# Patient Record
Sex: Male | Born: 1960
Health system: Southern US, Community
[De-identification: ages and names within clinical notes are randomized; demographics above are authoritative.]

## PROBLEM LIST (undated history)

## (undated) DIAGNOSIS — E039 Hypothyroidism, unspecified: Secondary | ICD-10-CM

## (undated) DIAGNOSIS — R42 Dizziness and giddiness: Secondary | ICD-10-CM

## (undated) DIAGNOSIS — E7800 Pure hypercholesterolemia, unspecified: Secondary | ICD-10-CM

## (undated) DIAGNOSIS — K922 Gastrointestinal hemorrhage, unspecified: Secondary | ICD-10-CM

## (undated) DIAGNOSIS — I700 Atherosclerosis of aorta: Secondary | ICD-10-CM

## (undated) DIAGNOSIS — E890 Postprocedural hypothyroidism: Secondary | ICD-10-CM

## (undated) DIAGNOSIS — C801 Malignant (primary) neoplasm, unspecified: Secondary | ICD-10-CM

## (undated) DIAGNOSIS — M5030 Other cervical disc degeneration, unspecified cervical region: Secondary | ICD-10-CM

## (undated) DIAGNOSIS — E559 Vitamin D deficiency, unspecified: Secondary | ICD-10-CM

## (undated) DIAGNOSIS — I5189 Other ill-defined heart diseases: Secondary | ICD-10-CM

## (undated) DIAGNOSIS — Z8619 Personal history of other infectious and parasitic diseases: Secondary | ICD-10-CM

## (undated) DIAGNOSIS — K921 Melena: Secondary | ICD-10-CM

## (undated) DIAGNOSIS — M5416 Radiculopathy, lumbar region: Secondary | ICD-10-CM

## (undated) DIAGNOSIS — D509 Iron deficiency anemia, unspecified: Secondary | ICD-10-CM

## (undated) DIAGNOSIS — G4730 Sleep apnea, unspecified: Secondary | ICD-10-CM

## (undated) DIAGNOSIS — E782 Mixed hyperlipidemia: Secondary | ICD-10-CM

## (undated) DIAGNOSIS — G4733 Obstructive sleep apnea (adult) (pediatric): Secondary | ICD-10-CM

## (undated) DIAGNOSIS — I454 Nonspecific intraventricular block: Secondary | ICD-10-CM

## (undated) DIAGNOSIS — E291 Testicular hypofunction: Secondary | ICD-10-CM

## (undated) DIAGNOSIS — C73 Malignant neoplasm of thyroid gland: Secondary | ICD-10-CM

## (undated) HISTORY — PX: NASAL SEPTUM SURGERY: SHX37

## (undated) HISTORY — DX: Malignant (primary) neoplasm, unspecified: C80.1

## (undated) HISTORY — DX: Personal history of other infectious and parasitic diseases: Z86.19

## (undated) HISTORY — DX: Dizziness and giddiness: R42

## (undated) HISTORY — PX: RETINAL DETACHMENT SURGERY: SHX105

## (undated) HISTORY — DX: Pure hypercholesterolemia, unspecified: E78.00

## (undated) HISTORY — PX: CATARACT EXTRACTION W/ INTRAOCULAR LENS  IMPLANT, BILATERAL: SHX1307

## (undated) HISTORY — DX: Sleep apnea, unspecified: G47.30

## (undated) HISTORY — PX: COLONOSCOPY: SHX5424

## (undated) HISTORY — DX: Melena: K92.1

---

## 1998-04-09 HISTORY — PX: CHOLECYSTECTOMY: SHX55

## 2006-03-21 ENCOUNTER — Ambulatory Visit: Payer: Self-pay | Admitting: Internal Medicine

## 2006-03-21 ENCOUNTER — Encounter: Payer: Self-pay | Admitting: Pulmonary Disease

## 2006-04-11 ENCOUNTER — Ambulatory Visit: Payer: Self-pay | Admitting: Internal Medicine

## 2006-04-11 ENCOUNTER — Encounter: Payer: Self-pay | Admitting: Pulmonary Disease

## 2007-04-10 DIAGNOSIS — C73 Malignant neoplasm of thyroid gland: Secondary | ICD-10-CM

## 2007-04-10 HISTORY — PX: TOTAL THYROIDECTOMY: SHX2547

## 2007-04-10 HISTORY — DX: Malignant neoplasm of thyroid gland: C73

## 2007-05-09 ENCOUNTER — Ambulatory Visit: Payer: Self-pay | Admitting: Internal Medicine

## 2007-05-15 ENCOUNTER — Ambulatory Visit: Payer: Self-pay | Admitting: Internal Medicine

## 2007-06-13 ENCOUNTER — Ambulatory Visit (HOSPITAL_COMMUNITY): Admission: RE | Admit: 2007-06-13 | Discharge: 2007-06-14 | Payer: Self-pay | Admitting: Surgery

## 2007-06-13 ENCOUNTER — Encounter (INDEPENDENT_AMBULATORY_CARE_PROVIDER_SITE_OTHER): Payer: Self-pay | Admitting: Surgery

## 2007-06-13 HISTORY — PX: TOTAL THYROIDECTOMY: SHX2547

## 2007-06-13 HISTORY — PX: LYMPH NODE DISSECTION: SHX5087

## 2007-08-12 ENCOUNTER — Encounter (HOSPITAL_COMMUNITY): Admission: RE | Admit: 2007-08-12 | Discharge: 2007-11-10 | Payer: Self-pay | Admitting: Endocrinology

## 2007-08-15 ENCOUNTER — Ambulatory Visit (HOSPITAL_COMMUNITY): Admission: RE | Admit: 2007-08-15 | Discharge: 2007-08-15 | Payer: Self-pay | Admitting: Endocrinology

## 2007-08-22 ENCOUNTER — Encounter (HOSPITAL_COMMUNITY): Admission: RE | Admit: 2007-08-22 | Discharge: 2007-11-20 | Payer: Self-pay | Admitting: Endocrinology

## 2008-03-02 ENCOUNTER — Ambulatory Visit (HOSPITAL_COMMUNITY): Admission: RE | Admit: 2008-03-02 | Discharge: 2008-03-03 | Payer: Self-pay | Admitting: Ophthalmology

## 2008-04-09 DIAGNOSIS — H3322 Serous retinal detachment, left eye: Secondary | ICD-10-CM

## 2008-04-09 HISTORY — DX: Serous retinal detachment, left eye: H33.22

## 2008-05-14 ENCOUNTER — Ambulatory Visit: Payer: Self-pay | Admitting: Unknown Physician Specialty

## 2008-05-19 LAB — HM COLONOSCOPY

## 2008-07-19 ENCOUNTER — Ambulatory Visit: Payer: Self-pay | Admitting: Endocrinology

## 2008-08-06 ENCOUNTER — Ambulatory Visit: Payer: Self-pay | Admitting: Internal Medicine

## 2008-08-10 ENCOUNTER — Ambulatory Visit: Payer: Self-pay | Admitting: Internal Medicine

## 2008-09-07 DIAGNOSIS — D229 Melanocytic nevi, unspecified: Secondary | ICD-10-CM

## 2008-09-07 HISTORY — DX: Melanocytic nevi, unspecified: D22.9

## 2009-05-20 ENCOUNTER — Encounter: Payer: Self-pay | Admitting: Pulmonary Disease

## 2009-06-10 ENCOUNTER — Ambulatory Visit: Payer: Self-pay | Admitting: Pulmonary Disease

## 2009-06-10 DIAGNOSIS — G4733 Obstructive sleep apnea (adult) (pediatric): Secondary | ICD-10-CM | POA: Insufficient documentation

## 2009-06-10 DIAGNOSIS — Z85850 Personal history of malignant neoplasm of thyroid: Secondary | ICD-10-CM | POA: Insufficient documentation

## 2009-06-21 ENCOUNTER — Encounter (INDEPENDENT_AMBULATORY_CARE_PROVIDER_SITE_OTHER): Payer: Self-pay | Admitting: *Deleted

## 2009-07-04 ENCOUNTER — Ambulatory Visit: Payer: Self-pay | Admitting: Endocrinology

## 2009-11-11 ENCOUNTER — Encounter: Payer: Self-pay | Admitting: Pulmonary Disease

## 2009-12-20 DIAGNOSIS — D239 Other benign neoplasm of skin, unspecified: Secondary | ICD-10-CM

## 2009-12-20 HISTORY — DX: Other benign neoplasm of skin, unspecified: D23.9

## 2010-05-09 NOTE — Letter (Signed)
Summary: New Patient letter  [facility]  [address] [place], [place] [address]   Phone: [phone]  Fax: [phone]       06/21/2009 MRN: [id number]  Matthew Kelley [address] [place], [place]  [address]  Dear Matthew Kelley,  Welcome to the Gastroenterology Division at [facility].    You are scheduled to see Dr.  Arlyce Dice on 07-20-09 at 10am on the 3rd floor at [facility], [address].  We ask that you try to arrive at our office 15 minutes prior to your appointment time to allow for check-in.  We would like you to complete the enclosed self-administered evaluation form prior to your visit and bring it with you on the day of your appointment.  We will review it with you.  Also, please bring a complete list of all your medications or, if you prefer, bring the medication bottles and we will list them.  Please bring your insurance card so that we may make a copy of it.  If your insurance requires a referral to see a specialist, please bring your referral form from your primary care physician.  Co-payments are due at the time of your visit and may be paid by cash, check or credit card.     Your office visit will consist of a consult with your physician (includes a physical exam), any laboratory testing he/she may order, scheduling of any necessary diagnostic testing (e.g. x-ray, ultrasound, CT-scan), and scheduling of a procedure (e.g. Endoscopy, Colonoscopy) if required.  Please allow enough time on your schedule to allow for any/all of these possibilities.    If you cannot keep your appointment, please call [phone] to cancel or reschedule prior to your appointment date.  This allows [place] the opportunity to schedule an appointment for another patient in need of care.  If you do not cancel or reschedule by 5 p.m. the business day prior to your appointment date, you will be charged a $50.00 late cancellation/no-show fee.    Thank you for choosing [facility] for your medical needs.  We appreciate the opportunity to care for you.  Please visit [place] at our website  to learn more about our practice.                     Sincerely,                                                             The Gastroenterology Division

## 2010-05-09 NOTE — Letter (Signed)
Summary: [facility]  [facility]   Imported By: Sherian Rein 07/05/2009 10:12:44  _____________________________________________________________________  External Attachment:    Type:   Image     Comment:   External Document

## 2010-05-09 NOTE — Assessment & Plan Note (Signed)
Summary: consult for osa   Copy to:  [place] Primary Provider/Referring Provider:  Georgiana Shore  CC:  Sleep Consult.  History of Present Illness: The pt is a 50y/o male who I have been asked to see for osa.  He has a h/o mild osa from npsg in 2007, which showed AHI of 8/hr, and desat to 81%.  He underwent titration study, and found to have optimal pressure of 11cm.  He was started on cpap, and did well with the device from a symptom standpoint, but is bothered by the inconvenience of the device and feels is adversely affecting his QOL.  Without cpap, he has definite snoring arousals, disrupts his wife's sleep, and has very mild daytime symptoms that are more aggrevating than anything else.  He typically goes to bed btw 9-11pm, and arises at 7am to start his day.  He does not feel unrested.  He does not feel significant EDS, and rates his epworth btw 9-13 on various days.  His weight overall has been stable.  He has been evaluated by Dr. Annalee Genta for possible surgical intervention, who feels that his overall anatomy is not that abnormal except for elongation of his palate.  Preventive Screening-Counseling & Management  Alcohol-Tobacco     Smoking Status: never  Medications Prior to Update: 1)  Synthroid 137 Mcg Tabs (Levothyroxine Sodium) .... Take 1 Tablet By Mouth Once A Day  Allergies (verified): No Known Drug Allergies  Past History:  Past Medical History:  THYROID CANCER, HX OF (ICD-V10.87) OBSTRUCTIVE SLEEP APNEA (ICD-327.23)    Past Surgical History: gallbladder  removed 10+ years ago thyroid removed 2009  Family History: Reviewed history and no changes required. none per pt report.   Social History: Reviewed history and no changes required. Patient never smoked.  pt is married. pt has children. pt is a Armed forces operational officer.   Smoking Status:  never  Review of Systems       The patient complains of difficulty swallowing and sore throat.  The patient denies  shortness of breath with activity, shortness of breath at rest, productive cough, non-productive cough, coughing up blood, chest pain, irregular heartbeats, acid heartburn, indigestion, loss of appetite, weight change, abdominal pain, tooth/dental problems, headaches, nasal congestion/difficulty breathing through nose, sneezing, itching, ear ache, anxiety, depression, hand/feet swelling, joint stiffness or pain, rash, change in color of mucus, and fever.    Vital Signs:  Patient profile:   50 year old male Height:      73 inches Weight:      198 pounds BMI:     26.22 O2 Sat:      93 % on Room air Temp:     98.1 degrees F oral Pulse rate:   78 / minute BP sitting:   130 / 70  (left arm) Cuff size:   regular  Vitals Entered By: Arman Filter LPN (June 10, 1189 10:27 AM)  O2 Flow:  Room air CC: Sleep Consult Comments Medications reviewed with patient Arman Filter LPN  June 10, 4780 10:33 AM    Physical Exam  General:  wd male in nad Eyes:  PERRLA and EOMI.   Nose:  minimal deviation to left, but patent bilat. Mouth:  moderate elongation of soft palate, normal uvula Neck:  no jvd, tmg, LN Lungs:  totally clear to auscultation  Heart:  rrr, no mrg Abdomen:  soft and nontender, bs+ Extremities:  no edema or cyanosis Neurologic:  alert and oriented, moves all 4.   Impression &  Recommendations:  Problem # 1:  OBSTRUCTIVE SLEEP APNEA (ICD-327.23)  The pt has a h/o very mild osa that is adequately treated with cpap, but the treatment is interfering with his QOL.  Surgery may be an option for him, as well as modest weight loss, but I have also discussed with him the possibility of a dental appliance.  I have given him an educational brochure on appliances, and asked him to do some research to see what he thinks.  I would be happy to refer him to [facility] for evaluation.  Other Orders: Consultation Level IV (81191)  Patient Instructions: 1)  consider dental appliance.. Will be  happy to refer you to [facility] dentistry for this. 2)  work on modest weight loss.

## 2010-05-09 NOTE — Consult Note (Signed)
Summary: [facility]   Imported By: Sherian Rein 01/16/2010 15:11:38  _____________________________________________________________________  External Attachment:    Type:   Image     Comment:   External Document

## 2010-06-29 ENCOUNTER — Telehealth: Payer: Self-pay | Admitting: Pulmonary Disease

## 2010-06-29 DIAGNOSIS — G4733 Obstructive sleep apnea (adult) (pediatric): Secondary | ICD-10-CM

## 2010-06-29 NOTE — Telephone Encounter (Signed)
Pt returning call

## 2010-06-29 NOTE — Telephone Encounter (Signed)
ATC # provided above- NA and unable to leave message.  Called pt's home # - Left message with wife for pt to return call.

## 2010-06-29 NOTE — Telephone Encounter (Signed)
ATC pt at # above.  NA and unable to leave message.  Therefore called pt's home # [phone] and [id number].

## 2010-06-29 NOTE — Telephone Encounter (Signed)
Called and spoke with pt.  Pt last saw [facility] 06/2009. Pt was referred to Dr. Orpah Greek at [facility] for a Dental Appliance.  Pt states he has been using his dental appliance for "several months."  Pt states Dr. Orpah Greek recommended pt follow back up with Dr. Shelle Iron to have a f/u sleep study to make sure the dental appliance is treating his osa.  Pt would like to have the sleep study done at [facility].  Will forward message to [facility] to address.

## 2010-06-30 NOTE — Telephone Encounter (Signed)
Called and spoke with pt and he is aware that order has been placed to [facility] to place this order---pt is aware that someone will call and give him the date and time of this test.

## 2010-06-30 NOTE — Telephone Encounter (Signed)
Let pt know we can set this up, and will call him with the results. Please send to [facility] to schedule npsg at sleep center.

## 2010-07-21 ENCOUNTER — Telehealth: Payer: Self-pay | Admitting: Pulmonary Disease

## 2010-07-21 DIAGNOSIS — G4733 Obstructive sleep apnea (adult) (pediatric): Secondary | ICD-10-CM

## 2010-07-21 NOTE — Telephone Encounter (Signed)
Looks the order that was place never made it to [facility]. Placed another order and advised pt someone should be calling him next week to set this up. Pt verbalized understanding.  Please advise [facility] this was suppose to be done 2 weeks ago. Pt also states if any way he can have the sleep study done on a Thursday. Thanks.

## 2010-07-24 NOTE — Telephone Encounter (Signed)
Spoke to pt he is going to get a call from [email or web address] to set up his npsg that is their protocol, records and referral faxed to them pt is aware Oneita Jolly

## 2010-08-22 NOTE — Op Note (Signed)
NAME:  Matthew Kelley, Matthew Kelley NO.:  [id number]   MEDICAL RECORD NO.:  [id number]          PATIENT TYPE:  AMB   LOCATION:  DAY                          FACILITY:  [facility]   PHYSICIAN:  Velora Heckler, MD      DATE OF BIRTH:  1960-08-30   DATE OF PROCEDURE:  06/13/2007  DATE OF DISCHARGE:                               OPERATIVE REPORT   PREOPERATIVE DIAGNOSIS:  Thyroid carcinoma.   POSTOPERATIVE DIAGNOSIS:  Thyroid carcinoma.   PROCEDURES:  1. Total thyroidectomy.  2. Central compartment lymph node dissection (Zone VI).   SURGEON:  Velora Heckler, MD, FACS   ASSISTANT:  Wilmon Arms. Corliss Skains, M.D., FACS   ANESTHESIA:  General per Jill Side, M.D.   ESTIMATED BLOOD LOSS:  Minimal.   PREPARATION:  Betadine.   COMPLICATIONS:  None.   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  The patient had been noted on routine physical examination in January  2009 to have a left thyroid nodule.  Ultrasound at [facility] confirmed a solid nodule in the left thyroid lobe  measuring 2.65 cm in maximum dimension.  It contained calcifications.  Needle biopsy was performed on May 15, 2007, and was suspicious for  papillary thyroid carcinoma.  The patient was referred to my practice  and now comes to surgery for thyroidectomy.   DESCRIPTION OF PROCEDURE:  Procedure was done in OR #1 at [facility].  The patient was prepped and draped in the usual  strict aseptic fashion.  After ascertaining that an adequate level of  anesthesia had been achieved, a Kocher incision is made a #15 blade.  Dissection is carried through subcutaneous tissues and platysma.  Hemostasis is obtained with electrocautery.  Skin flaps are elevated  cephalad and caudad from the thyroid notch to the sternal notch.  A  Mahorner self-retaining retractor is placed for  exposure.  Strap muscles  are incised in the midline and dissection is begun on the left side.  Strap muscles are slightly adherent to the anterior surface of the gland  consistent with previous needle biopsy.  There is a hard mass present in  the mid and inferior portion of the left thyroid lobe consistent with  tumor.  There is no gross invasion of surrounding tissues.  Strap  muscles are reflected laterally.  Venous tributaries are divided between  Ligaclips with the Harmonic scalpel.  Superior pole is dissected out.  Vessels are ligated in continuity with 2-0 silk ties and medium  Ligaclips and divided with the Harmonic scalpel.  Gland is rolled  anteriorly.  Branches of the inferior thyroid artery are divided between  small Ligaclips .  Parathyroid tissue is identified and preserved.  Recurrent nerve is identified and preserved.  Gland is rolled further  anteriorly.  Ligament of Allyson Sabal is transected with the electrocautery.  While tumor is proximate to the ligament of Berry and the trachea, again  mobilized up and onto the anterior trachea and the isthmus is mobilized  across the midline with the electrocautery used for hemostasis.  A small  pyramidal lobe is dissected off the anterior thyroid cartilage and  included with the specimen.  Dry pack is placed in the left neck.   Next, we turned our attention to the right thyroid lobe.  Again strap  muscles are reflected laterally.  The right lobe appears grossly normal.  There are no obvious masses and no palpable nodularity.  Venous  tributaries are divided between Ligaclips with the Harmonic scalpel.  Superior pole is dissected out and superior pole vessels divided between  medium Ligaclips with the Harmonic scalpel.  Gland is rolled anteriorly  and parathyroid tissue is identified and preserved.  Branches of the  inferior thyroid artery are divided between small Ligaclips.  Recurrent  nerve is identified and preserved.  Ligament of  Allyson Sabal is transected with  the electrocautery.  Inferior venous tributaries are divided between  small and medium Ligaclips with the Harmonic scalpel.  Gland is  mobilized off the trachea with electrocautery and the remaining tissue  just inferior to the isthmus is divided with Harmonic scalpel.  Specimen  is marked with a suture at the left superior pole.  The thyroid is  submitted in its entirety to pathology for review.  Next, the central  compartment zone six lymph nodes are dissected out using the  electrocautery for hemostasis.  Dissection is carried between the  carotid arteries down to the level of the innominate.  Care is taken to  avoid the recurrent nerves.  The left inferior parathyroid gland is  identified and preserved.  No gross lymphadenopathy is identified.  Thyrothymic tract on the left is also resected and included with the  specimen.  Hemostasis is obtained with the Harmonic scalpel.  The  specimen is submitted separately labeled central compartment lymph nodes  for review.   Neck is copiously irrigated with warm saline.  This is evacuated.  Good  hemostasis is noted bilaterally.  Surgicel is placed in the operative  field bilaterally.  Strap muscles are reapproximated in the midline with  interrupted 3-0 Vicryl sutures.  Platysma is closed with interrupted 3-0  Vicryl sutures.  Skin is closed with a running 4-0 Monocryl subcuticular  suture.  Wound is washed and dried and Steri-Strips are applied.  Sterile dressings are applied.  The patient is awakened from anesthesia  and brought to the recovery room in stable condition.  The patient  tolerated the procedure well.      Velora Heckler, MD  Electronically Signed     TMG/MEDQ  D:  06/13/2007  T:  06/15/2007  Job:  [id number]   cc:   Dale Bonanza  Fax: [phone]

## 2010-08-22 NOTE — Op Note (Signed)
NAME:  Matthew Kelley, GUASTELLA NO.:  [id number]   MEDICAL RECORD NO.:  [id number]          PATIENT TYPE:  OIB   LOCATION:  5123                         FACILITY:  [facility]   PHYSICIAN:  Beulah Gandy. [name], M.D. DATE OF BIRTH:  [birth year]   DATE OF PROCEDURE:  03/02/2008  DATE OF DISCHARGE:                               OPERATIVE REPORT   ADMISSION DIAGNOSES:  Rhegmatogenous retinal detachment, left eye and  lattice degeneration with breaks, right eye.   PROCEDURES:  Laser retinal breaks, right eye and scleral buckle, left  eye with laser retinal breaks, left eye as well   SURGEON:  John D. [name], MD   ASSISTANT:  Rosalie Doctor, MA   ANESTHESIA:  General.   DESCRIPTION OF PROCEDURE:  After appropriate endotracheal anesthesia was  obtained, the attention was carried to the right eye where 890 burns of  indirect ophthalmoscope laser were used to seal lattice degeneration  zones from 11 o'clock around to 7 o'clock.  The power was 500 mW, 1000  microns each and 0.1 seconds each.  Ointment was placed in the right eye  and it was covered.  Attention was then carried to the left eye where  the usual prep and drape was performed, 360-degree limbal peritomy,  isolation of 4 rectus muscles on 2-0 silk, localization of break at 4  o'clock.  Scleral dissection from 9:30 o'clock around to 8:30 to admit  #279 intrascleral implant.  Diathermy placed in the bed.  Additional  508G radial segment was placed at 5 o'clock.  Perforation site chosen at  4 o'clock in the posterior aspect of the bed.  A large amount of clear,  colorless, sticky, thick subretinal fluid came forth.  The buckle was  adjusted and trimmed.  The band was adjusted and trimmed.  The radial  element was placed.  Scleral sutures were drawn securely.  Indirect  ophthalmoscopy showed the retina be lying nicely on the scleral buckle  with no subretinal fluid remaining.  The indirect ophthalmoscope laser  was moved into  place, 625 burns were placed all around the retinal break  and on the scleral buckle.  The power was 500 mW, 1000 microns each and  0.1 seconds each.  The scleral sutures were drawn securely, knotted, and  the free ends removed.  The conjunctiva was reapproximated with 7-0  chromic suture.  Polymyxin and gentamicin were irrigated into [place]  space.  Atropine solution was applied, Decadron 10 mg was injected into  the lower subconjunctival space.  Closing pressure was 15 with a  Barraquer tonometer.  Polysporin ophthalmic ointment, patch, and shield  were placed.  The patient was awakened and taken to recovery in  satisfactory condition.  Complications none.  Duration one and a half  hours.      Beulah Gandy. [name], M.D.  Electronically Signed     JDM/MEDQ  D:  03/02/2008  T:  03/03/2008  Job:  [id number]

## 2010-08-31 ENCOUNTER — Ambulatory Visit: Payer: Self-pay | Admitting: Pulmonary Disease

## 2010-09-27 ENCOUNTER — Telehealth: Payer: Self-pay | Admitting: Pulmonary Disease

## 2010-09-27 NOTE — Telephone Encounter (Signed)
LMOM informing pt we have not received results yet from [facility] but will call [facility] tomorrow morning to see if they can fax these results to [place].

## 2010-09-27 NOTE — Telephone Encounter (Signed)
KC, have you seen these results - per order, this was going to be done at [facility].  Pls advise.  Thanks!

## 2010-09-27 NOTE — Telephone Encounter (Signed)
I have not seen this study.  Need to track down.  Let pt know that I will call him as soon as I have the results in hand.

## 2010-09-27 NOTE — Telephone Encounter (Signed)
See note string

## 2010-09-28 NOTE — Telephone Encounter (Signed)
Called and requested sleep study results from [facility]

## 2010-09-28 NOTE — Telephone Encounter (Signed)
Received results from [facility] and put in [facility] very important look at folder for him to review.

## 2010-09-29 NOTE — Telephone Encounter (Signed)
Called and discussed results with pt.  He had ahi 10/hr without guard, and 6/hr with guard.  Low sat 80%, with only 2-3 min entire night spent less than 90%.  Pt feels he is doing well with dental appliance.  Sleeping well, adequate daytime alertness. I have asked him to f/u with Dental medicine at [facility], and continue with dental appliance.

## 2010-09-29 NOTE — Telephone Encounter (Signed)
Patient returning call.  [phone]

## 2010-10-09 ENCOUNTER — Encounter: Payer: Self-pay | Admitting: Pulmonary Disease

## 2010-10-12 ENCOUNTER — Ambulatory Visit: Payer: Self-pay | Admitting: Unknown Physician Specialty

## 2010-10-27 ENCOUNTER — Ambulatory Visit: Payer: Self-pay | Admitting: Unknown Physician Specialty

## 2011-01-01 LAB — DIFFERENTIAL
Basophils Absolute: 0
Basophils Relative: 1
Eosinophils Absolute: 0.1
Eosinophils Relative: 3
Lymphocytes Relative: 41
Lymphs Abs: 1.9
Monocytes Absolute: 0.5
Monocytes Relative: 10
Neutro Abs: 2.1
Neutrophils Relative %: 45

## 2011-01-01 LAB — CBC
HCT: 40.3
Hemoglobin: 14
MCHC: 34.7
MCV: 83.4
Platelets: 263
RBC: 4.84
RDW: 13.1
WBC: 4.7

## 2011-01-01 LAB — BASIC METABOLIC PANEL
BUN: 16
CO2: 28
Calcium: 9.3
Chloride: 104
Creatinine, Ser: 1.01
GFR calc Af Amer: >60
GFR calc non Af Amer: >60
Glucose, Bld: 93
Potassium: 4.1
Sodium: 139

## 2011-01-01 LAB — CALCIUM
Calcium: 8.3 — ABNORMAL LOW
Calcium: 8.8

## 2011-01-01 LAB — URINALYSIS, ROUTINE W REFLEX MICROSCOPIC
Bilirubin Urine: NEGATIVE
Glucose, UA: NEGATIVE
Ketones, ur: NEGATIVE
Leukocytes, UA: NEGATIVE
Nitrite: NEGATIVE
Protein, ur: NEGATIVE
Specific Gravity, Urine: 1.028
Urobilinogen, UA: 0.2
pH: 5.5

## 2011-01-01 LAB — URINE MICROSCOPIC-ADD ON

## 2011-01-01 LAB — PROTIME-INR
INR: 1
Prothrombin Time: 13.1

## 2011-01-09 LAB — CBC
HCT: 41.9
Hemoglobin: 14
MCHC: 33.5
MCV: 86.3
Platelets: 192
RBC: 4.85
RDW: 13.1
WBC: 3.7 — ABNORMAL LOW

## 2011-01-09 LAB — BASIC METABOLIC PANEL
BUN: 15
CO2: 28
Calcium: 9.3
Chloride: 107
Creatinine, Ser: 0.96
GFR calc Af Amer: >60
GFR calc non Af Amer: >60
Glucose, Bld: 108 — ABNORMAL HIGH
Potassium: 4
Sodium: 140

## 2012-05-12 ENCOUNTER — Ambulatory Visit: Payer: Self-pay | Admitting: Endocrinology

## 2012-08-14 ENCOUNTER — Ambulatory Visit: Payer: Self-pay | Admitting: Endocrinology

## 2013-07-09 ENCOUNTER — Telehealth: Payer: Self-pay | Admitting: Internal Medicine

## 2013-07-09 NOTE — Telephone Encounter (Signed)
I canceled Dr Nehemiah Massed appointment for Friday 07/10/13 (office closed) He stated he needed Friday appointment please let me know when you would like me to r/s him or do you want me to just go to the next 30 min Friday appointment

## 2013-07-10 ENCOUNTER — Ambulatory Visit: Payer: Self-pay | Admitting: Internal Medicine

## 2013-07-10 NOTE — Telephone Encounter (Signed)
See if he can come in at 11:30 on 08/14/13.  Thanks.  To establish care/cpe

## 2013-07-15 NOTE — Telephone Encounter (Signed)
Appointment 5/8 mailed npp letting pt know about appointment date and time

## 2013-07-24 ENCOUNTER — Encounter: Payer: Self-pay | Admitting: Internal Medicine

## 2013-07-24 ENCOUNTER — Ambulatory Visit (INDEPENDENT_AMBULATORY_CARE_PROVIDER_SITE_OTHER): Payer: No Typology Code available for payment source | Admitting: Internal Medicine

## 2013-07-24 ENCOUNTER — Encounter (INDEPENDENT_AMBULATORY_CARE_PROVIDER_SITE_OTHER): Payer: Self-pay

## 2013-07-24 VITALS — BP 110/70 | HR 73 | Temp 98.5°F | Ht 72.5 in | Wt 198.5 lb

## 2013-07-24 DIAGNOSIS — Z85850 Personal history of malignant neoplasm of thyroid: Secondary | ICD-10-CM

## 2013-07-24 DIAGNOSIS — E291 Testicular hypofunction: Secondary | ICD-10-CM

## 2013-07-24 DIAGNOSIS — G4733 Obstructive sleep apnea (adult) (pediatric): Secondary | ICD-10-CM

## 2013-07-24 DIAGNOSIS — E7800 Pure hypercholesterolemia, unspecified: Secondary | ICD-10-CM

## 2013-07-24 DIAGNOSIS — M25551 Pain in right hip: Secondary | ICD-10-CM

## 2013-07-24 DIAGNOSIS — H3320 Serous retinal detachment, unspecified eye: Secondary | ICD-10-CM

## 2013-07-24 DIAGNOSIS — R7989 Other specified abnormal findings of blood chemistry: Secondary | ICD-10-CM

## 2013-07-24 DIAGNOSIS — M25559 Pain in unspecified hip: Secondary | ICD-10-CM

## 2013-07-24 DIAGNOSIS — D1800 Hemangioma unspecified site: Secondary | ICD-10-CM

## 2013-07-24 NOTE — Progress Notes (Signed)
Pre visit review using our clinic review tool, if applicable. No additional management support is needed unless otherwise documented below in the visit note. 

## 2013-07-27 ENCOUNTER — Encounter: Payer: Self-pay | Admitting: Internal Medicine

## 2013-08-01 ENCOUNTER — Encounter: Payer: Self-pay | Admitting: Internal Medicine

## 2013-08-01 DIAGNOSIS — E7800 Pure hypercholesterolemia, unspecified: Secondary | ICD-10-CM | POA: Insufficient documentation

## 2013-08-01 DIAGNOSIS — M25551 Pain in right hip: Secondary | ICD-10-CM | POA: Insufficient documentation

## 2013-08-01 DIAGNOSIS — H3320 Serous retinal detachment, unspecified eye: Secondary | ICD-10-CM | POA: Insufficient documentation

## 2013-08-01 DIAGNOSIS — R7989 Other specified abnormal findings of blood chemistry: Secondary | ICD-10-CM | POA: Insufficient documentation

## 2013-08-01 DIAGNOSIS — D1800 Hemangioma unspecified site: Secondary | ICD-10-CM | POA: Insufficient documentation

## 2013-08-01 NOTE — Assessment & Plan Note (Signed)
Followed by opthalmology.  Stable.

## 2013-08-01 NOTE — Assessment & Plan Note (Signed)
S/p thyroidectomy and radioactive iodine ablation.  Followed by Dr Forde Dandy.  Obtain records.  Doing well.  Thyroid tests checked this am.

## 2013-08-01 NOTE — Assessment & Plan Note (Signed)
Using CPAP.  Needs new equipment.  Previous sleep study through [facility].

## 2013-08-01 NOTE — Assessment & Plan Note (Signed)
On lipitor.  Had cholesterol panel and liver panel checked this am.  Follow.

## 2013-08-01 NOTE — Assessment & Plan Note (Signed)
Followed by Dr Drema Dallas.

## 2013-08-01 NOTE — Assessment & Plan Note (Signed)
Seeing Dr Stoioff.  On Clomid and arimidex.  Feels better.  Energy improved.    

## 2013-08-01 NOTE — Progress Notes (Signed)
Subjective:    Patient ID: Matthew Kelley, male    DOB: 05-01-1960, 53 y.o.   MRN: [id number]  HPI 53 year old male with past history of thyroid cancer s/p thyroidectomy and radioactive iodine ablation, hypercholesterolemia and sleep apnea who comes in today to follow up on these issues as well as to transfer his care over to [facility].  Former patient of mine at [employer] [facility].  States overall he is doing well.  Has been followed by Dr Roque Cash.  He has been following his thyroid and his cholesterol.  Taking Lipitor and tolerating.  His CPAP is broken.  Needs new equipment.  Had his previous study at [facility].  Eating and drinking well.  No sob.  No chest pain.  Exercising.  [facility] for hemangioma (larynx).  Being followed yearly.  Swallowing ok.  Seeing Dr Shaune Leeks for his right hip pain.  Better.  Also seeing Dr Bernardo Heater and found to have low testosterone.  Started on Clomid and arimidex.  Has more energy and endurance.  Feels better.  Also being followed by opthalmology for a detached retina.  Stable.     Past Medical History  Diagnosis Date  . Cancer     thyroid cancer  . History of chicken pox   . Hypercholesterolemia   . Blood in stool     h/o  . Sleep apnea     Outpatient Encounter Prescriptions as of 07/24/2013  Medication Sig  . anastrozole (ARIMIDEX) 1 MG tablet Take 1 mg by mouth daily.  Marland Kitchen atorvastatin (LIPITOR) 10 MG tablet Take 10 mg by mouth daily.  . clomiPHENE (CLOMID) 50 MG tablet Take 50 mg by mouth every other day.  . Coenzyme Q10 (CO Q 10 PO) Take by mouth daily.  Marland Kitchen levothyroxine (SYNTHROID, LEVOTHROID) 137 MCG tablet Take 137 mcg by mouth daily before breakfast.  . Vitamin D, Ergocalciferol, (DRISDOL) 50000 UNITS CAPS capsule Take 50,000 Units by mouth every 7 (seven) days.    Review of Systems Patient denies any headache, lightheadedness or dizziness.  Eyes stable.  No increased sinus congestion or drainage.  No swallowing problems.   No chest  pain, tightness or palpitations.  No increased shortness of breath, cough or congestion.  No nausea or vomiting.  No acid reflux.   No abdominal pain or cramping.  No bowel change, such as diarrhea, constipation, BRBPR or melana.  No urine change.  More energy.  Increased endurance.        Objective:   Physical Exam Filed Vitals:   07/24/13 1358  BP: 110/70  Pulse: 73  Temp: 98.5 F (36.9 C)   Blood pressure recheck:  118/72, pulse 48  53 year old male in no acute distress.  HEENT:  Nares - clear.  Oropharynx - without lesions. NECK:  Supple.  Nontender.  No audible carotid bruit.  HEART:  Appears to be regular.   LUNGS:  No crackles or wheezing audible.  Respirations even and unlabored.   RADIAL PULSE:  Equal bilaterally.  ABDOMEN:  Soft.  Nontender.  Bowel sounds present and normal.  No audible abdominal bruit.    RECTAL:  Could not appreciate any palpable prostate nodules.  Heme negative.   EXTREMITIES:  No increased edema present.  DP pulses palpable and equal bilaterally.         Assessment & Plan:  HEALTH MAINTENANCE.  Physical today.  Had labs this am.  States had PSA checked.  Up to date with  colonoscopy.  Need records.

## 2013-08-01 NOTE — Assessment & Plan Note (Signed)
Hemangioma on the larynx.  Followed by [facility].  Stable.  No swallowing issues.  Follow.

## 2013-08-14 ENCOUNTER — Ambulatory Visit: Payer: Self-pay | Admitting: Internal Medicine

## 2013-10-17 LAB — BASIC METABOLIC PANEL
BUN: 17 mg/dL (ref 4–21)
Creatinine: 1.3 mg/dL (ref 0.6–1.3)
Glucose: 101 mg/dL
Potassium: 4.4 mmol/L (ref 3.4–5.3)
Sodium: 140 mmol/L (ref 137–147)

## 2013-10-17 LAB — TSH: TSH: 0.09 u[IU]/mL — AB (ref 0.41–5.90)

## 2013-11-13 ENCOUNTER — Telehealth: Payer: Self-pay | Admitting: Internal Medicine

## 2013-11-13 NOTE — Telephone Encounter (Signed)
Placed in Dr. Scott's box °

## 2013-11-13 NOTE — Telephone Encounter (Signed)
Pt came by and dropped off a copy of his lab work results.

## 2013-11-16 ENCOUNTER — Other Ambulatory Visit: Payer: Self-pay | Admitting: Internal Medicine

## 2013-11-16 NOTE — Telephone Encounter (Signed)
Followed by urology and endocrinology.  Will review labs for our records.

## 2013-11-16 NOTE — Telephone Encounter (Signed)
Results given to Dr. Nicki Reaper (and abstracted)

## 2013-11-25 ENCOUNTER — Telehealth: Payer: Self-pay | Admitting: Internal Medicine

## 2013-11-25 NOTE — Telephone Encounter (Signed)
Pt request paperwork to be filled out. Paperwork in Dr. Bary Leriche box

## 2014-01-15 ENCOUNTER — Ambulatory Visit (INDEPENDENT_AMBULATORY_CARE_PROVIDER_SITE_OTHER): Payer: No Typology Code available for payment source | Admitting: Internal Medicine

## 2014-01-15 ENCOUNTER — Encounter: Payer: Self-pay | Admitting: Internal Medicine

## 2014-01-15 VITALS — BP 110/60 | HR 77 | Temp 98.4°F | Ht 72.5 in | Wt 194.5 lb

## 2014-01-15 DIAGNOSIS — Z23 Encounter for immunization: Secondary | ICD-10-CM

## 2014-01-15 DIAGNOSIS — E7800 Pure hypercholesterolemia, unspecified: Secondary | ICD-10-CM

## 2014-01-15 DIAGNOSIS — Z85850 Personal history of malignant neoplasm of thyroid: Secondary | ICD-10-CM

## 2014-01-15 DIAGNOSIS — E291 Testicular hypofunction: Secondary | ICD-10-CM

## 2014-01-15 DIAGNOSIS — G4733 Obstructive sleep apnea (adult) (pediatric): Secondary | ICD-10-CM

## 2014-01-15 DIAGNOSIS — R7989 Other specified abnormal findings of blood chemistry: Secondary | ICD-10-CM

## 2014-01-15 DIAGNOSIS — D1800 Hemangioma unspecified site: Secondary | ICD-10-CM

## 2014-01-15 NOTE — Progress Notes (Signed)
Pre visit review using our clinic review tool, if applicable. No additional management support is needed unless otherwise documented below in the visit note. 

## 2014-01-24 ENCOUNTER — Encounter: Payer: Self-pay | Admitting: Internal Medicine

## 2014-01-24 NOTE — Assessment & Plan Note (Signed)
Seeing Dr Bernardo Heater.  On Clomid and arimidex.  Feels better.  Energy improved.

## 2014-01-24 NOTE — Progress Notes (Signed)
Subjective:    Patient ID: Matthew Kelley, male    DOB: [birth year], 53 y.o.   MRN: [id number]  HPI 53 year old male with past history of thyroid cancer s/p thyroidectomy and radioactive iodine ablation, hypercholesterolemia and sleep apnea who comes in today for a scheduled follow up.  States overall he is doing well.  Has been followed by Dr Roque Cash.  He has been following his thyroid.  Stable.  Taking Lipitor and tolerating.  Using his CPAP nightly.  Tolerating.  Nasal cannula has helped.  Feels better.  Compliant.   Eating and drinking well.  No sob.  No chest pain.  Exercising.  Followed at [facility] for hemangioma (larynx).  Being followed yearly.   Also seeing Dr Bernardo Heater and found to have low testosterone.  Started on Clomid and arimidex.  Feels better.  Also being followed by opthalmology for a detached retina.  Overall he feels he is doing well.      Past Medical History  Diagnosis Date  . Cancer     thyroid cancer  . History of chicken pox   . Hypercholesterolemia   . Blood in stool     h/o  . Sleep apnea     Outpatient Encounter Prescriptions as of 01/15/2014  Medication Sig  . anastrozole (ARIMIDEX) 1 MG tablet Take 1 mg by mouth daily.  Marland Kitchen atorvastatin (LIPITOR) 10 MG tablet Take 10 mg by mouth daily.  . clomiPHENE (CLOMID) 50 MG tablet Take 50 mg by mouth every other day.  . Coenzyme Q10 (CO Q 10 PO) Take by mouth daily.  Marland Kitchen levothyroxine (SYNTHROID, LEVOTHROID) 150 MCG tablet Take 150 mcg by mouth daily before breakfast.  . Vitamin D, Ergocalciferol, (DRISDOL) 50000 UNITS CAPS capsule Take 50,000 Units by mouth every 7 (seven) days.  . [DISCONTINUED] levothyroxine (SYNTHROID, LEVOTHROID) 137 MCG tablet Take 137 mcg by mouth daily before breakfast.    Review of Systems Patient denies any headache, lightheadedness or dizziness.  No increased sinus congestion or drainage.  No swallowing problems.   No chest pain, tightness or palpitations.  No increased shortness of  breath, cough or congestion.  No nausea or vomiting.  No acid reflux.   No abdominal pain or cramping.  No bowel change, such as diarrhea, constipation, BRBPR or melana.  No urine change.  Feels good.  Using CPAP as outlined.         Objective:   Physical Exam  Filed Vitals:   01/15/14 1507  BP: 110/60  Pulse: 77  Temp: 98.4 F (36.9 C)   Blood pressure recheck:  60/71  53 year old male in no acute distress.  HEENT:  Nares - clear.  Oropharynx - without lesions. NECK:  Supple.  Nontender.  No audible carotid bruit.  HEART:  Appears to be regular.   LUNGS:  No crackles or wheezing audible.  Respirations even and unlabored.   RADIAL PULSE:  Equal bilaterally.  ABDOMEN:  Soft.  Nontender.  Bowel sounds present and normal.  No audible abdominal bruit.  EXTREMITIES:  No increased edema present.  DP pulses palpable and equal bilaterally.         Assessment & Plan:  HEALTH MAINTENANCE.  Physical last visit.   PSA and prostate checks followed by urology.  Up to date with colonoscopy.   Problem List Items Addressed This Visit   Hemangioma     Has a history of hemangioma on the larynx.  Followed by [facility].  Stable.  No swallowing issues.  Follow.       Hypercholesterolemia     On lipitor.  Low cholesterol diet.  Continue exercise.  Follow lipid panel and liver function.       Low testosterone     Seeing Dr Bernardo Heater.  On Clomid and arimidex.  Feels better.  Energy improved.       Obstructive sleep apnea     Uses his CPAP every night.  Sleeps better with the CPAP.  Tolerates.  Is compliant with use.  Feels better.  See report for details.  Nasal cannula helps.        THYROID CANCER, HX OF     S/p thyroidectomy and radioactive iodine ablation.  Followed by Dr Forde Dandy.  Doing well.  Thyroid tests checked followed by Dr Forde Dandy.        Other Visit Diagnoses   Encounter for immunization    -  Primary        Flu shot given today.

## 2014-01-24 NOTE — Assessment & Plan Note (Addendum)
Uses his CPAP every night.  Sleeps better with the CPAP.  Tolerates.  Is compliant with use.  Feels better.  See report for details.  Nasal cannula helps.

## 2014-01-24 NOTE — Assessment & Plan Note (Signed)
S/p thyroidectomy and radioactive iodine ablation.  Followed by Dr Forde Dandy.  Doing well.  Thyroid tests checked followed by Dr Forde Dandy.

## 2014-01-24 NOTE — Assessment & Plan Note (Signed)
Has a history of hemangioma on the larynx.  Followed by [facility].  Stable.  No swallowing issues.  Follow.

## 2014-01-24 NOTE — Assessment & Plan Note (Signed)
On lipitor.  Low cholesterol diet.  Continue exercise.  Follow lipid panel and liver function.

## 2014-07-30 ENCOUNTER — Encounter: Payer: Self-pay | Admitting: Internal Medicine

## 2014-07-30 ENCOUNTER — Ambulatory Visit (INDEPENDENT_AMBULATORY_CARE_PROVIDER_SITE_OTHER): Payer: No Typology Code available for payment source | Admitting: Internal Medicine

## 2014-07-30 VITALS — BP 112/68 | HR 76 | Temp 98.5°F | Resp 14 | Ht 72.0 in | Wt 196.4 lb

## 2014-07-30 DIAGNOSIS — Z85850 Personal history of malignant neoplasm of thyroid: Secondary | ICD-10-CM | POA: Diagnosis not present

## 2014-07-30 DIAGNOSIS — Z0000 Encounter for general adult medical examination without abnormal findings: Secondary | ICD-10-CM

## 2014-07-30 DIAGNOSIS — D1800 Hemangioma unspecified site: Secondary | ICD-10-CM

## 2014-07-30 DIAGNOSIS — E7800 Pure hypercholesterolemia, unspecified: Secondary | ICD-10-CM

## 2014-07-30 DIAGNOSIS — G4733 Obstructive sleep apnea (adult) (pediatric): Secondary | ICD-10-CM | POA: Diagnosis not present

## 2014-07-30 NOTE — Assessment & Plan Note (Signed)
History of hemangioma on larynx.  Has been followed at [facility].  Was smaller on recent check.  Has been released.

## 2014-07-30 NOTE — Assessment & Plan Note (Signed)
Physical 07/30/14.  PSA 06/28/14 -.5.  States up to date with colonoscopy.  Obtain records of last.

## 2014-07-30 NOTE — Assessment & Plan Note (Addendum)
Followed by Dr Forde Dandy.  Stable.  Due to f/u next month.

## 2014-07-30 NOTE — Progress Notes (Signed)
Patient ID: Matthew Kelley, male   DOB: 11-26-1960, 54 y.o.   MRN: [id number]   Subjective:    Patient ID: Matthew Kelley, male    DOB: 27-Sep-1960, 54 y.o.   MRN: [id number]  HPI  Patient here for his physical exam.  Doing well.  Exercising.  Back to swimming.  No cardiac symptoms with increased activity or exertion.  Breathing stable.  No allergy problems.  Bowels stable.  No blood.     Past Medical History  Diagnosis Date  . Cancer     thyroid cancer  . History of chicken pox   . Hypercholesterolemia   . Blood in stool     h/o  . Sleep apnea   . Vertigo     Outpatient Encounter Prescriptions as of 07/30/2014  Medication Sig  . anastrozole (ARIMIDEX) 1 MG tablet Take 1 mg by mouth 3 (three) times a week.   Marland Kitchen atorvastatin (LIPITOR) 10 MG tablet Take 10 mg by mouth daily.  . clomiPHENE (CLOMID) 50 MG tablet Take 50 mg by mouth 2 (two) times a week.   . Coenzyme Q10 (CO Q 10 PO) Take by mouth daily.  . finasteride (PROPECIA) 1 MG tablet Take 1 mg by mouth daily.   Marland Kitchen levothyroxine (SYNTHROID, LEVOTHROID) 175 MCG tablet Take 175 mcg by mouth daily before breakfast.  . scopolamine (TRANSDERM-SCOP) 1 MG/3DAYS Place 1 patch onto the skin every 3 (three) days.  . Vitamin D, Ergocalciferol, (DRISDOL) 50000 UNITS CAPS capsule Take 50,000 Units by mouth every 7 (seven) days.  . [DISCONTINUED] levothyroxine (SYNTHROID, LEVOTHROID) 150 MCG tablet Take 150 mcg by mouth daily before breakfast.    Review of Systems  Constitutional: Negative for appetite change and unexpected weight change.  HENT: Negative for congestion and sinus pressure.   Respiratory: Negative for cough, chest tightness and shortness of breath.   Cardiovascular: Negative for chest pain, palpitations and leg swelling.  Gastrointestinal: Negative for nausea, vomiting, abdominal pain, diarrhea and blood in stool.  Genitourinary: Positive for urgency. Negative for difficulty urinating.  Musculoskeletal: Negative for back pain and  joint swelling.  Skin: Negative for color change and rash.  Neurological: Negative for dizziness, light-headedness and headaches.  Hematological: Negative for adenopathy. Does not bruise/bleed easily.  Psychiatric/Behavioral: Negative for dysphoric mood and agitation.       Objective:   blood pressure recheck:  122/76  Physical Exam  Constitutional: He is oriented to person, place, and time. He appears well-developed and well-nourished. No distress.  HENT:  Nose: Nose normal.  Mouth/Throat: Oropharynx is clear and moist. No oropharyngeal exudate.  Eyes: Conjunctivae are normal. Right eye exhibits no discharge. Left eye exhibits no discharge.  Neck: Neck supple. No thyromegaly present.  Cardiovascular: Normal rate and regular rhythm.   Pulmonary/Chest: Breath sounds normal. No respiratory distress. He has no wheezes.  Abdominal: Soft. Bowel sounds are normal. There is no tenderness.  Genitourinary:  Performed by urology  Musculoskeletal: He exhibits no edema or tenderness.  Lymphadenopathy:    He has no cervical adenopathy.  Neurological: He is alert and oriented to person, place, and time.  Skin: Skin is warm and dry. No rash noted.  Psychiatric: He has a normal mood and affect. His behavior is normal.    BP 112/68 mmHg  Pulse 76  Temp(Src) 98.5 F (36.9 C) (Oral)  Resp 14  Ht 6' (1.829 m)  Wt 196 lb 6.4 oz (89.086 kg)  BMI 26.63 kg/m2  SpO2 97% Wt Readings from Last 3 Encounters:  07/30/14 196 lb 6.4 oz (89.086 kg)  01/15/14 194 lb 8 oz (88.225 kg)  07/24/13 198 lb 8 oz (90.039 kg)     Lab Results  Component Value Date   WBC 3.7* 03/02/2008   HGB 14.0 03/02/2008   HCT 41.9 03/02/2008   PLT 192 03/02/2008   GLUCOSE 108* 03/02/2008   NA 140 10/17/2013   K 4.4 10/17/2013   CL 107 03/02/2008   CREATININE 1.3 10/17/2013   BUN 17 10/17/2013   CO2 28 03/02/2008   TSH 0.09* 10/17/2013   INR 1.0 06/09/2007       Assessment & Plan:   Problem List Items  Addressed This Visit    Health care maintenance    Physical 07/30/14.  PSA 06/28/14 -.5.  States up to date with colonoscopy.  Obtain records of last.        Hemangioma    History of hemangioma on larynx.  Has been followed at [facility].  Was smaller on recent check.  Has been released.        Hypercholesterolemia    Cholesterol just checked - LDL 92.  Continue diet and exercise.  Follow. On lipitor.        Obstructive sleep apnea - Primary    Wearing CPAP.  Doing well.  Follow.       THYROID CANCER, HX OF    Followed by Dr Forde Dandy.  Stable.  Due to f/u next month.            Einar Pheasant, MD

## 2014-07-30 NOTE — Assessment & Plan Note (Signed)
Cholesterol just checked - LDL 92.  Continue diet and exercise.  Follow. On lipitor.

## 2014-07-30 NOTE — Progress Notes (Signed)
Pre visit review using our clinic review tool, if applicable. No additional management support is needed unless otherwise documented below in the visit note. 

## 2014-07-30 NOTE — Assessment & Plan Note (Signed)
Wearing CPAP.  Doing well.  Follow.

## 2015-02-25 ENCOUNTER — Telehealth: Payer: Self-pay | Admitting: Internal Medicine

## 2015-02-25 DIAGNOSIS — G4733 Obstructive sleep apnea (adult) (pediatric): Secondary | ICD-10-CM

## 2015-02-25 NOTE — Telephone Encounter (Signed)
Pt called about needing a sleep study referral. Thank You!

## 2015-03-02 NOTE — Telephone Encounter (Signed)
LMTCB/lsw

## 2015-03-02 NOTE — Telephone Encounter (Signed)
He has known sleep apnea.  Does he need this for a new machine, insurance reasons, etc?  Where does he want this scheduled.  Thanks

## 2015-03-02 NOTE — Telephone Encounter (Signed)
Pt states that he has been on a new machine for about a year now & feels that the setting are too strong. He was seeing ENT for a Hemangioma in his throat & it has gone down in size. He states that his insurance asked for a new study after starting on the new machine & he feels that he needs it to determine what setting are appropriate for him now. Would like to go to [facility]

## 2015-03-05 NOTE — Telephone Encounter (Signed)
Order placed for split night sleep study to [facility]

## 2015-03-14 ENCOUNTER — Telehealth: Payer: Self-pay | Admitting: *Deleted

## 2015-03-14 NOTE — Telephone Encounter (Signed)
Patient requested a update on the sleep study referral, appointment. Please Advise

## 2015-03-31 ENCOUNTER — Ambulatory Visit: Payer: No Typology Code available for payment source | Attending: Neurology

## 2015-05-05 ENCOUNTER — Ambulatory Visit: Payer: No Typology Code available for payment source | Attending: Neurology

## 2015-05-05 DIAGNOSIS — G4733 Obstructive sleep apnea (adult) (pediatric): Secondary | ICD-10-CM | POA: Diagnosis not present

## 2015-06-02 ENCOUNTER — Ambulatory Visit: Payer: No Typology Code available for payment source | Attending: Neurology

## 2015-06-02 DIAGNOSIS — G4733 Obstructive sleep apnea (adult) (pediatric): Secondary | ICD-10-CM | POA: Insufficient documentation

## 2015-06-06 DIAGNOSIS — G4733 Obstructive sleep apnea (adult) (pediatric): Secondary | ICD-10-CM | POA: Diagnosis not present

## 2015-06-17 DIAGNOSIS — G4733 Obstructive sleep apnea (adult) (pediatric): Secondary | ICD-10-CM | POA: Diagnosis not present

## 2015-06-28 ENCOUNTER — Encounter: Payer: Self-pay | Admitting: Internal Medicine

## 2015-07-01 ENCOUNTER — Telehealth: Payer: Self-pay | Admitting: Internal Medicine

## 2015-07-01 NOTE — Telephone Encounter (Signed)
rx written.  Please fax to [facility].  Let me know if any problems.

## 2015-07-01 NOTE — Telephone Encounter (Signed)
Rx faxed to [facility]

## 2015-07-01 NOTE — Telephone Encounter (Signed)
Pt called in today. He is requesting from Dr. Nicki Reaper to have his cpap pressure dropped from 8 to 7. You can call pt's cell number.

## 2015-07-01 NOTE — Telephone Encounter (Signed)
Please advise to change settings on CPAP thanks

## 2015-07-08 DIAGNOSIS — E890 Postprocedural hypothyroidism: Secondary | ICD-10-CM | POA: Diagnosis not present

## 2015-07-08 DIAGNOSIS — E291 Testicular hypofunction: Secondary | ICD-10-CM | POA: Diagnosis not present

## 2015-07-08 DIAGNOSIS — C73 Malignant neoplasm of thyroid gland: Secondary | ICD-10-CM | POA: Diagnosis not present

## 2015-07-08 DIAGNOSIS — E559 Vitamin D deficiency, unspecified: Secondary | ICD-10-CM | POA: Diagnosis not present

## 2015-08-05 ENCOUNTER — Ambulatory Visit (INDEPENDENT_AMBULATORY_CARE_PROVIDER_SITE_OTHER): Payer: 59 | Admitting: Internal Medicine

## 2015-08-05 ENCOUNTER — Encounter: Payer: Self-pay | Admitting: Internal Medicine

## 2015-08-05 VITALS — BP 102/70 | HR 81 | Temp 97.7°F | Ht 72.0 in | Wt 194.6 lb

## 2015-08-05 DIAGNOSIS — Z1211 Encounter for screening for malignant neoplasm of colon: Secondary | ICD-10-CM

## 2015-08-05 DIAGNOSIS — M25551 Pain in right hip: Secondary | ICD-10-CM

## 2015-08-05 DIAGNOSIS — G4733 Obstructive sleep apnea (adult) (pediatric): Secondary | ICD-10-CM | POA: Diagnosis not present

## 2015-08-05 DIAGNOSIS — R7989 Other specified abnormal findings of blood chemistry: Secondary | ICD-10-CM

## 2015-08-05 DIAGNOSIS — Z0000 Encounter for general adult medical examination without abnormal findings: Secondary | ICD-10-CM | POA: Diagnosis not present

## 2015-08-05 DIAGNOSIS — E291 Testicular hypofunction: Secondary | ICD-10-CM

## 2015-08-05 DIAGNOSIS — E7800 Pure hypercholesterolemia, unspecified: Secondary | ICD-10-CM

## 2015-08-05 DIAGNOSIS — Z85850 Personal history of malignant neoplasm of thyroid: Secondary | ICD-10-CM | POA: Diagnosis not present

## 2015-08-05 MED ORDER — ATORVASTATIN CALCIUM 10 MG PO TABS: 10.0000 mg | ORAL_TABLET | Freq: Every day | ORAL | Status: DC

## 2015-08-05 NOTE — Progress Notes (Signed)
Patient ID: Matthew Kelley, male   DOB: 1960-11-16, 55 y.o.   MRN: [id number]   Subjective:    Patient ID: Matthew Kelley, male    DOB: June 17, 1960, 55 y.o.   MRN: [id number]  HPI  Patient here for his physical exam.  Is doing well.  Exercises regularly.  No cardiac symptoms with increased activity or exertion.  No sob.  Eating and drinking well.  No nausea or vomiting.  No bowel changes.  Sees Dr Bernardo Heater for his prostate checks.  Previously found to have decreased testosterone.  Was on clomid.  Off now.  Really did not feel much of a difference in symptoms.  Some occasional hip joint - aching.  Does not limit activity.  Not severe.  Some intermittent urinary frequency.  No dysuria.    Past Medical History  Diagnosis Date  . Cancer [facility])     thyroid cancer  . History of chicken pox   . Hypercholesterolemia   . Blood in stool     h/o  . Sleep apnea   . Vertigo    Past Surgical History  Procedure Laterality Date  . Cholecystectomy  2000  . Total thyroidectomy  2009   Family History  Problem Relation Age of Onset  . Hyperlipidemia Father   . Heart disease Father    Social History   Social History  . Marital Status: Married    Spouse Name: N/A  . Number of Children: N/A  . Years of Education: N/A   Social History Main Topics  . Smoking status: Never Smoker   . Smokeless tobacco: Never Used  . Alcohol Use: 0.0 oz/week    0 Standard drinks or equivalent per week  . Drug Use: No  . Sexual Activity: Not Asked   Other Topics Concern  . None   Social History Narrative    Outpatient Encounter Prescriptions as of 08/05/2015  Medication Sig  . finasteride (PROPECIA) 1 MG tablet Take 1 mg by mouth daily.   Marland Kitchen levothyroxine (SYNTHROID, LEVOTHROID) 175 MCG tablet Take 175 mcg by mouth daily before breakfast.  . Vitamin D, Ergocalciferol, (DRISDOL) 50000 UNITS CAPS capsule Take 50,000 Units by mouth every 7 (seven) days.  Marland Kitchen anastrozole (ARIMIDEX) 1 MG tablet Take 1 mg by mouth 3  (three) times a week. Reported on 08/05/2015  . atorvastatin (LIPITOR) 10 MG tablet Take 1 tablet (10 mg total) by mouth daily. Reported on 08/05/2015  . clomiPHENE (CLOMID) 50 MG tablet Take 50 mg by mouth 2 (two) times a week. Reported on 08/05/2015  . Coenzyme Q10 (CO Q 10 PO) Take by mouth daily. Reported on 08/05/2015  . RESTASIS 0.05 % ophthalmic emulsion instill 1 drop into both eyes twice a day  . scopolamine (TRANSDERM-SCOP) 1 MG/3DAYS Place 1 patch onto the skin every 3 (three) days. Reported on 08/05/2015  . [DISCONTINUED] atorvastatin (LIPITOR) 10 MG tablet Take 10 mg by mouth daily. Reported on 08/05/2015   No facility-administered encounter medications on file as of 08/05/2015.    Review of Systems  Constitutional: Negative for appetite change and unexpected weight change.  HENT: Negative for congestion and sinus pressure.   Eyes: Negative for pain and visual disturbance.  Respiratory: Negative for cough, chest tightness and shortness of breath.   Cardiovascular: Negative for chest pain, palpitations and leg swelling.  Gastrointestinal: Negative for nausea, vomiting, abdominal pain, diarrhea and blood in stool.  Genitourinary: Positive for frequency. Negative for dysuria and difficulty urinating.  Musculoskeletal: Negative for back pain and  joint swelling.  Skin: Negative for color change and rash.  Neurological: Negative for dizziness, light-headedness and headaches.  Hematological: Negative for adenopathy. Does not bruise/bleed easily.  Psychiatric/Behavioral: Negative for dysphoric mood and agitation.       Objective:    Physical Exam  Constitutional: He is oriented to person, place, and time. He appears well-developed and well-nourished. No distress.  HENT:  Head: Normocephalic and atraumatic.  Nose: Nose normal.  Mouth/Throat: Oropharynx is clear and moist. No oropharyngeal exudate.  Eyes: Conjunctivae are normal. Right eye exhibits no discharge. Left eye exhibits no  discharge.  Neck: Neck supple. No thyromegaly present.  Cardiovascular: Normal rate and regular rhythm.   Pulmonary/Chest: Breath sounds normal. No respiratory distress. He has no wheezes.  Abdominal: Soft. Bowel sounds are normal. There is no tenderness.  Genitourinary:  Performed by urology.   Musculoskeletal: He exhibits no edema or tenderness.  Lymphadenopathy:    He has no cervical adenopathy.  Neurological: He is alert and oriented to person, place, and time.  Skin: Skin is warm and dry. No rash noted. No erythema.  Psychiatric: He has a normal mood and affect. His behavior is normal.    BP 102/70 mmHg  Pulse 81  Temp(Src) 97.7 F (36.5 C) (Oral)  Ht 6' (1.829 m)  Wt 194 lb 9.6 oz (88.27 kg)  BMI 26.39 kg/m2  SpO2 98% Wt Readings from Last 3 Encounters:  08/05/15 194 lb 9.6 oz (88.27 kg)  07/30/14 196 lb 6.4 oz (89.086 kg)  01/15/14 194 lb 8 oz (88.225 kg)     Lab Results  Component Value Date   WBC 3.7* 03/02/2008   HGB 14.0 03/02/2008   HCT 41.9 03/02/2008   PLT 192 03/02/2008   GLUCOSE 108* 03/02/2008   NA 140 10/17/2013   K 4.4 10/17/2013   CL 107 03/02/2008   CREATININE 1.3 10/17/2013   BUN 17 10/17/2013   CO2 28 03/02/2008   TSH 0.09* 10/17/2013   INR 1.0 06/09/2007       Assessment & Plan:   Problem List Items Addressed This Visit    Health care maintenance    Physical today 08/05/15.  Prostate/PSA followed by Dr Bernardo Heater.  PSA 07/08/15 - .3.  Colonoscopy 2010.  Recommended f/u colonoscopy 2020.  Hemoccult cards given.        Hypercholesterolemia    Cholesterol just checked and elevated.  Off lipitor for several months.  Will restart.  Check lipid panel and liver function tests in several months.  Order given to pt.       Relevant Medications   atorvastatin (LIPITOR) 10 MG tablet   Low testosterone    Followed by Dr Bernardo Heater.  On no medication now.       Obstructive sleep apnea    Wearing CPAP regularly.  Just adjusted pressure.  Doing  better.  Follow.       Right hip pain    Has seen Dr Drema Dallas previously.  Some bilateral hip discomfort at times.  Exercises.  Does not affect him from exercising.  Follow.        THYROID CANCER, HX OF    Followed by Dr Forde Dandy.  tsh recently checked and slightly suppressed.  Thyroid antibody checked as well.  Due to see Dr Forde Dandy soon.  He will adjust medication and do further thyroid testing if necessary.         Other Visit Diagnoses    Colon cancer screening    -  Primary  Relevant Orders    Fecal occult blood, imunochemical        Einar Pheasant, MD

## 2015-08-05 NOTE — Progress Notes (Signed)
Pre visit review using our clinic review tool, if applicable. No additional management support is needed unless otherwise documented below in the visit note. 

## 2015-08-07 ENCOUNTER — Encounter: Payer: Self-pay | Admitting: Internal Medicine

## 2015-08-07 NOTE — Assessment & Plan Note (Signed)
Cholesterol just checked and elevated.  Off lipitor for several months.  Will restart.  Check lipid panel and liver function tests in several months.  Order given to pt.

## 2015-08-07 NOTE — Assessment & Plan Note (Signed)
Has seen Dr Drema Dallas previously.  Some bilateral hip discomfort at times.  Exercises.  Does not affect him from exercising.  Follow.

## 2015-08-07 NOTE — Assessment & Plan Note (Signed)
Wearing CPAP regularly.  Just adjusted pressure.  Doing better.  Follow.

## 2015-08-07 NOTE — Assessment & Plan Note (Signed)
Physical today 08/05/15.  Prostate/PSA followed by Dr Bernardo Heater.  PSA 07/08/15 - .3.  Colonoscopy 2010.  Recommended f/u colonoscopy 2020.  Hemoccult cards given.

## 2015-08-07 NOTE — Assessment & Plan Note (Signed)
Followed by Dr Bernardo Heater.  On no medication now.

## 2015-08-07 NOTE — Assessment & Plan Note (Signed)
Followed by Dr Forde Dandy.  tsh recently checked and slightly suppressed.  Thyroid antibody checked as well.  Due to see Dr Forde Dandy soon.  He will adjust medication and do further thyroid testing if necessary.

## 2015-08-19 DIAGNOSIS — H43813 Vitreous degeneration, bilateral: Secondary | ICD-10-CM | POA: Diagnosis not present

## 2015-08-19 DIAGNOSIS — H35372 Puckering of macula, left eye: Secondary | ICD-10-CM | POA: Diagnosis not present

## 2015-08-19 DIAGNOSIS — H353132 Nonexudative age-related macular degeneration, bilateral, intermediate dry stage: Secondary | ICD-10-CM | POA: Diagnosis not present

## 2015-09-09 DIAGNOSIS — E559 Vitamin D deficiency, unspecified: Secondary | ICD-10-CM | POA: Diagnosis not present

## 2015-09-09 DIAGNOSIS — C73 Malignant neoplasm of thyroid gland: Secondary | ICD-10-CM | POA: Diagnosis not present

## 2015-09-09 DIAGNOSIS — G4739 Other sleep apnea: Secondary | ICD-10-CM | POA: Diagnosis not present

## 2015-09-09 DIAGNOSIS — Z6825 Body mass index (BMI) 25.0-25.9, adult: Secondary | ICD-10-CM | POA: Diagnosis not present

## 2015-09-09 DIAGNOSIS — E784 Other hyperlipidemia: Secondary | ICD-10-CM | POA: Diagnosis not present

## 2015-09-09 DIAGNOSIS — E890 Postprocedural hypothyroidism: Secondary | ICD-10-CM | POA: Diagnosis not present

## 2015-09-09 DIAGNOSIS — Z1389 Encounter for screening for other disorder: Secondary | ICD-10-CM | POA: Diagnosis not present

## 2015-09-09 DIAGNOSIS — E230 Hypopituitarism: Secondary | ICD-10-CM | POA: Diagnosis not present

## 2015-09-12 DIAGNOSIS — G4733 Obstructive sleep apnea (adult) (pediatric): Secondary | ICD-10-CM | POA: Diagnosis not present

## 2015-10-07 DIAGNOSIS — E291 Testicular hypofunction: Secondary | ICD-10-CM | POA: Diagnosis not present

## 2015-11-02 ENCOUNTER — Other Ambulatory Visit: Payer: Self-pay

## 2015-11-02 MED ORDER — ATORVASTATIN CALCIUM 10 MG PO TABS: 10.0000 mg | ORAL_TABLET | Freq: Every day | ORAL | 1 refills | Status: DC

## 2015-11-04 ENCOUNTER — Telehealth: Payer: Self-pay | Admitting: *Deleted

## 2015-11-04 NOTE — Telephone Encounter (Signed)
Patient will need a prior authorization for Lipitor Pharmacy [facility] [phone] Fax [phone]

## 2015-11-07 NOTE — Telephone Encounter (Signed)
Spoke with the pharmacy, PA not indicated for this drug.  Thanks

## 2015-11-11 DIAGNOSIS — H33311 Horseshoe tear of retina without detachment, right eye: Secondary | ICD-10-CM | POA: Diagnosis not present

## 2015-11-11 DIAGNOSIS — H4311 Vitreous hemorrhage, right eye: Secondary | ICD-10-CM | POA: Diagnosis not present

## 2015-11-15 DIAGNOSIS — H33311 Horseshoe tear of retina without detachment, right eye: Secondary | ICD-10-CM | POA: Diagnosis not present

## 2015-11-30 DIAGNOSIS — H4311 Vitreous hemorrhage, right eye: Secondary | ICD-10-CM | POA: Diagnosis not present

## 2015-12-01 DIAGNOSIS — H33311 Horseshoe tear of retina without detachment, right eye: Secondary | ICD-10-CM | POA: Diagnosis not present

## 2015-12-01 DIAGNOSIS — H35411 Lattice degeneration of retina, right eye: Secondary | ICD-10-CM | POA: Diagnosis not present

## 2015-12-01 DIAGNOSIS — H4311 Vitreous hemorrhage, right eye: Secondary | ICD-10-CM | POA: Diagnosis not present

## 2015-12-20 DIAGNOSIS — G4733 Obstructive sleep apnea (adult) (pediatric): Secondary | ICD-10-CM | POA: Diagnosis not present

## 2015-12-26 ENCOUNTER — Telehealth: Payer: Self-pay | Admitting: Internal Medicine

## 2015-12-26 MED ORDER — LEVOTHYROXINE SODIUM 175 MCG PO TABS: 175.0000 ug | ORAL_TABLET | Freq: Every day | ORAL | 1 refills | Status: DC

## 2015-12-26 MED ORDER — ATORVASTATIN CALCIUM 10 MG PO TABS: 10.0000 mg | ORAL_TABLET | Freq: Every day | ORAL | 1 refills | Status: DC

## 2015-12-26 MED ORDER — FINASTERIDE 1 MG PO TABS: 1.0000 mg | ORAL_TABLET | Freq: Every day | ORAL | 1 refills | Status: DC

## 2015-12-26 NOTE — Telephone Encounter (Signed)
Sent in meds per request, thanks

## 2015-12-26 NOTE — Telephone Encounter (Signed)
Pt pharmacy called about needing a refill for all his maintenance medications.   Pharmacy is [facility] employee pharmacy . Thank you!

## 2016-01-03 DIAGNOSIS — J029 Acute pharyngitis, unspecified: Secondary | ICD-10-CM | POA: Diagnosis not present

## 2016-01-06 ENCOUNTER — Telehealth: Payer: Self-pay | Admitting: Internal Medicine

## 2016-01-06 NOTE — Telephone Encounter (Signed)
Are there any questions you ned answered fro changing CPAP settings?

## 2016-01-06 NOTE — Telephone Encounter (Signed)
Please clarify if he thinks it needs to be changed since his last cpap titration (I think in February).  If changes, will probably need to do autotitration to see what settings are needed now.

## 2016-01-06 NOTE — Telephone Encounter (Signed)
Spoken to patient. Patient feels the pressure may be too much. He was decreased from 8 to 7. But now he is having a sore throat and mouth.  A throat CX and Sputum test have came back negative.  Patient would like to do the Auto Titration to see what settings would work best for him. If needed he stated he will do another sleep study. If there are any questions patient stated you may call his cell phone at [phone]. Please advise.

## 2016-01-06 NOTE — Telephone Encounter (Signed)
Pt called wanting to know if the settings on his C pap can be changed?   Call pt @ [phone]. Thank you!

## 2016-01-08 NOTE — Telephone Encounter (Signed)
Pt has CPAP.  He needs auto titration.  Feels pressure is too high.  What do I need to do to arrange auto titration.  Thanks

## 2016-01-10 NOTE — Telephone Encounter (Signed)
Called [facility] per [facility]. LVM to return my call to see what we need to do to adjust his pressure

## 2016-01-11 NOTE — Telephone Encounter (Signed)
rx written and placed in  Your box.

## 2016-01-11 NOTE — Telephone Encounter (Signed)
faxed

## 2016-01-11 NOTE — Telephone Encounter (Signed)
They returned my call. She states that he is set on a 7 and 20 is the highest they can go. She states if another test needs to be ordered then it will just need to be wrote on a rx pad- Auto CPAP trial 4-20 2 weeks. Set pressure after trial. Lynita Lombard

## 2016-01-11 NOTE — Telephone Encounter (Signed)
Ok.  Just let me know if you need something from me.  Thanks

## 2016-01-11 NOTE — Telephone Encounter (Signed)
Fax # [phone]

## 2016-01-30 DIAGNOSIS — J020 Streptococcal pharyngitis: Secondary | ICD-10-CM | POA: Diagnosis not present

## 2016-02-03 ENCOUNTER — Other Ambulatory Visit: Payer: Self-pay | Admitting: Internal Medicine

## 2016-02-03 NOTE — Telephone Encounter (Signed)
See attached note.

## 2016-02-03 NOTE — Telephone Encounter (Signed)
Please advise for refill for 6 months, last appt was in April, next appt in April next year. thanks

## 2016-02-03 NOTE — Telephone Encounter (Signed)
Ok to fill 

## 2016-02-03 NOTE — Telephone Encounter (Signed)
Left a detailed message for patient that we need recent labs to get refill completed. Thanks Left both phone and fax number to send it to, thanks

## 2016-02-03 NOTE — Telephone Encounter (Signed)
Please call and see if he has had recent labs showing vitamin D level low.  Need more information.

## 2016-02-03 NOTE — Telephone Encounter (Signed)
Pt needs a refill on Vitamin D, Ergocalciferol, (DRISDOL) 50000 UNITS CAPS capsule sent to [facility] And would like a 38m refill

## 2016-02-13 NOTE — Telephone Encounter (Signed)
Attempted to reach and not able to get patient again, I will close refill request until call received. thanks

## 2016-03-16 DIAGNOSIS — G4733 Obstructive sleep apnea (adult) (pediatric): Secondary | ICD-10-CM | POA: Diagnosis not present

## 2016-03-30 DIAGNOSIS — H16223 Keratoconjunctivitis sicca, not specified as Sjogren's, bilateral: Secondary | ICD-10-CM | POA: Diagnosis not present

## 2016-04-06 DIAGNOSIS — C73 Malignant neoplasm of thyroid gland: Secondary | ICD-10-CM | POA: Diagnosis not present

## 2016-04-06 DIAGNOSIS — E784 Other hyperlipidemia: Secondary | ICD-10-CM | POA: Diagnosis not present

## 2016-04-06 DIAGNOSIS — E559 Vitamin D deficiency, unspecified: Secondary | ICD-10-CM | POA: Diagnosis not present

## 2016-04-06 DIAGNOSIS — E890 Postprocedural hypothyroidism: Secondary | ICD-10-CM | POA: Diagnosis not present

## 2016-04-06 DIAGNOSIS — E291 Testicular hypofunction: Secondary | ICD-10-CM | POA: Diagnosis not present

## 2016-04-06 LAB — BASIC METABOLIC PANEL
BUN: 14 mg/dL (ref 4–21)
Creatinine: 1 mg/dL (ref 0.6–1.3)
Glucose: 93 mg/dL
Potassium: 4.1 mmol/L (ref 3.4–5.3)
Sodium: 142 mmol/L (ref 137–147)

## 2016-04-06 LAB — LIPID PANEL
Cholesterol: 130 mg/dL (ref 0–200)
HDL: 37 mg/dL (ref 35–70)
LDL Cholesterol: 81 mg/dL
Triglycerides: 60 mg/dL (ref 40–160)

## 2016-04-06 LAB — CBC AND DIFFERENTIAL
HCT: 41 % (ref 41–53)
Hemoglobin: 14 g/dL (ref 13.5–17.5)
Neutrophils Absolute: 53 /uL
Platelets: 219 10*3/uL (ref 150–399)

## 2016-04-06 LAB — HEPATIC FUNCTION PANEL
ALT: 77 U/L — AB (ref 10–40)
AST: 36 U/L (ref 14–40)
Alkaline Phosphatase: 85 U/L (ref 25–125)
Bilirubin, Total: 0.7 mg/dL

## 2016-04-06 LAB — PSA: PSA: 0.3

## 2016-04-06 LAB — TSH: TSH: 0.23 u[IU]/mL — AB (ref 0.41–5.90)

## 2016-04-06 LAB — VITAMIN D 25 HYDROXY (VIT D DEFICIENCY, FRACTURES): Vit D, 25-Hydroxy: 48.2

## 2016-05-11 DIAGNOSIS — E782 Mixed hyperlipidemia: Secondary | ICD-10-CM | POA: Diagnosis not present

## 2016-05-11 DIAGNOSIS — R0782 Intercostal pain: Secondary | ICD-10-CM | POA: Diagnosis not present

## 2016-06-08 DIAGNOSIS — E784 Other hyperlipidemia: Secondary | ICD-10-CM | POA: Diagnosis not present

## 2016-06-08 DIAGNOSIS — E559 Vitamin D deficiency, unspecified: Secondary | ICD-10-CM | POA: Diagnosis not present

## 2016-06-08 DIAGNOSIS — Z6826 Body mass index (BMI) 26.0-26.9, adult: Secondary | ICD-10-CM | POA: Diagnosis not present

## 2016-06-08 DIAGNOSIS — E890 Postprocedural hypothyroidism: Secondary | ICD-10-CM | POA: Diagnosis not present

## 2016-06-08 DIAGNOSIS — Z1389 Encounter for screening for other disorder: Secondary | ICD-10-CM | POA: Diagnosis not present

## 2016-06-08 DIAGNOSIS — G4730 Sleep apnea, unspecified: Secondary | ICD-10-CM | POA: Diagnosis not present

## 2016-06-08 DIAGNOSIS — C73 Malignant neoplasm of thyroid gland: Secondary | ICD-10-CM | POA: Diagnosis not present

## 2016-08-03 DIAGNOSIS — H43812 Vitreous degeneration, left eye: Secondary | ICD-10-CM | POA: Diagnosis not present

## 2016-08-03 DIAGNOSIS — H35372 Puckering of macula, left eye: Secondary | ICD-10-CM | POA: Diagnosis not present

## 2016-08-03 DIAGNOSIS — H353132 Nonexudative age-related macular degeneration, bilateral, intermediate dry stage: Secondary | ICD-10-CM | POA: Diagnosis not present

## 2016-08-03 DIAGNOSIS — H2513 Age-related nuclear cataract, bilateral: Secondary | ICD-10-CM | POA: Diagnosis not present

## 2016-08-10 ENCOUNTER — Ambulatory Visit (INDEPENDENT_AMBULATORY_CARE_PROVIDER_SITE_OTHER): Payer: 59 | Admitting: Internal Medicine

## 2016-08-10 ENCOUNTER — Ambulatory Visit (INDEPENDENT_AMBULATORY_CARE_PROVIDER_SITE_OTHER): Payer: 59

## 2016-08-10 ENCOUNTER — Encounter: Payer: Self-pay | Admitting: Internal Medicine

## 2016-08-10 VITALS — BP 120/78 | HR 68 | Temp 98.6°F | Resp 12 | Ht 72.0 in | Wt 198.8 lb

## 2016-08-10 DIAGNOSIS — M5442 Lumbago with sciatica, left side: Secondary | ICD-10-CM

## 2016-08-10 DIAGNOSIS — G4733 Obstructive sleep apnea (adult) (pediatric): Secondary | ICD-10-CM

## 2016-08-10 DIAGNOSIS — Z85850 Personal history of malignant neoplasm of thyroid: Secondary | ICD-10-CM

## 2016-08-10 DIAGNOSIS — M47816 Spondylosis without myelopathy or radiculopathy, lumbar region: Secondary | ICD-10-CM | POA: Diagnosis not present

## 2016-08-10 DIAGNOSIS — Z0000 Encounter for general adult medical examination without abnormal findings: Secondary | ICD-10-CM

## 2016-08-10 DIAGNOSIS — E7800 Pure hypercholesterolemia, unspecified: Secondary | ICD-10-CM

## 2016-08-10 MED ORDER — ATORVASTATIN CALCIUM 10 MG PO TABS: 10.0000 mg | ORAL_TABLET | Freq: Every day | ORAL | 1 refills | Status: DC

## 2016-08-10 NOTE — Progress Notes (Signed)
Pre-visit discussion using our clinic review tool. No additional management support is needed unless otherwise documented below in the visit note.  

## 2016-08-10 NOTE — Progress Notes (Signed)
Patient ID: Matthew Kelley, male   DOB: 1961-03-31, 56 y.o.   MRN: [id number]   Subjective:    Patient ID: Matthew Kelley, male    DOB: 1960/05/22, 57 y.o.   MRN: [id number]  HPI  Patient with past history of thyroid cancer, sleep apnea and hypercholesterolemia.  He comes in today to follow up on these issues as well as for a complete physical exam.  He report he is doing well.  Does report some persistent low back pain and left hip pain.  Has been intermittent for the last two years.  Worsening lately.  Occasionally will notice pain into his left leg and calf.  Limits his activity.  Bending worsens.  Takes motrin prn.  Does exercise.  No chest pain.  No sob.  No acid reflux.  No abdominal pain.  Bowels moving.  No bleeding.     Past Medical History:  Diagnosis Date  . Blood in stool    h/o  . Cancer [facility])    thyroid cancer  . History of chicken pox   . Hypercholesterolemia   . Sleep apnea   . Vertigo    Past Surgical History:  Procedure Laterality Date  . CHOLECYSTECTOMY  2000  . TOTAL THYROIDECTOMY  2009   Family History  Problem Relation Age of Onset  . Hyperlipidemia Father   . Heart disease Father    Social History   Social History  . Marital status: Married    Spouse name: N/A  . Number of children: N/A  . Years of education: N/A   Social History Main Topics  . Smoking status: Never Smoker  . Smokeless tobacco: Never Used  . Alcohol use 0.0 oz/week  . Drug use: No  . Sexual activity: Not Asked   Other Topics Concern  . None   Social History Narrative  . None    Outpatient Encounter Prescriptions as of 08/10/2016  Medication Sig  . atorvastatin (LIPITOR) 10 MG tablet Take 1 tablet (10 mg total) by mouth daily. Reported on 08/05/2015  . finasteride (PROPECIA) 1 MG tablet Take 1 tablet (1 mg total) by mouth daily.  Marland Kitchen levothyroxine (SYNTHROID, LEVOTHROID) 175 MCG tablet Take 1 tablet (175 mcg total) by mouth daily before breakfast.  . RESTASIS 0.05 % ophthalmic  emulsion instill 1 drop into both eyes twice a day  . scopolamine (TRANSDERM-SCOP) 1 MG/3DAYS Place 1 patch onto the skin every 3 (three) days. Reported on 08/05/2015  . Vitamin D, Ergocalciferol, (DRISDOL) 50000 UNITS CAPS capsule Take 50,000 Units by mouth every 7 (seven) days.  . [DISCONTINUED] anastrozole (ARIMIDEX) 1 MG tablet Take 1 mg by mouth 3 (three) times a week. Reported on 08/05/2015  . [DISCONTINUED] atorvastatin (LIPITOR) 10 MG tablet Take 1 tablet (10 mg total) by mouth daily. Reported on 08/05/2015  . [DISCONTINUED] clomiPHENE (CLOMID) 50 MG tablet Take 50 mg by mouth 2 (two) times a week. Reported on 08/05/2015  . [DISCONTINUED] Coenzyme Q10 (CO Q 10 PO) Take by mouth daily. Reported on 08/05/2015   No facility-administered encounter medications on file as of 08/10/2016.     Review of Systems  Constitutional: Negative for appetite change and unexpected weight change.  HENT: Negative for congestion and sinus pressure.   Eyes: Negative for pain and visual disturbance.  Respiratory: Negative for cough, chest tightness and shortness of breath.   Cardiovascular: Negative for chest pain, palpitations and leg swelling.  Gastrointestinal: Negative for abdominal pain, diarrhea, nausea and vomiting.  Genitourinary: Negative for difficulty urinating,  dysuria and frequency.  Musculoskeletal: Positive for back pain. Negative for joint swelling.  Skin: Negative for color change and rash.  Neurological: Negative for dizziness, light-headedness and headaches.  Hematological: Negative for adenopathy. Does not bruise/bleed easily.  Psychiatric/Behavioral: Negative for agitation and dysphoric mood.       Objective:    Physical Exam  Constitutional: He is oriented to person, place, and time. He appears well-developed and well-nourished. No distress.  HENT:  Head: Normocephalic and atraumatic.  Nose: Nose normal.  Mouth/Throat: Oropharynx is clear and moist. No oropharyngeal exudate.  Eyes:  Conjunctivae are normal. Right eye exhibits no discharge. Left eye exhibits no discharge.  Neck: Neck supple. No thyromegaly present.  Cardiovascular: Normal rate and regular rhythm.   Pulmonary/Chest: Breath sounds normal. No respiratory distress. He has no wheezes.  Abdominal: Soft. Bowel sounds are normal. There is no tenderness.  Genitourinary:  Genitourinary Comments: Rectal exam - no palpable prostate nodule.  Heme negative.   Musculoskeletal: He exhibits no edema or tenderness.  Lymphadenopathy:    He has no cervical adenopathy.  Neurological: He is alert and oriented to person, place, and time.  Skin: Skin is warm and dry. No rash noted.  Psychiatric: He has a normal mood and affect. His behavior is normal.    BP 120/78 (BP Location: Left Arm, Patient Position: Sitting, Cuff Size: Normal)   Pulse 68   Temp 98.6 F (37 C) (Oral)   Resp 12   Ht 6' (1.829 m)   Wt 198 lb 12.8 oz (90.2 kg)   SpO2 97%   BMI 26.96 kg/m  Wt Readings from Last 3 Encounters:  08/10/16 198 lb 12.8 oz (90.2 kg)  08/05/15 194 lb 9.6 oz (88.3 kg)  07/30/14 196 lb 6.4 oz (89.1 kg)     Lab Results  Component Value Date   WBC 3.7 (L) 03/02/2008   HGB 14.0 03/02/2008   HCT 41.9 03/02/2008   PLT 192 03/02/2008   GLUCOSE 108 (H) 03/02/2008   NA 140 10/17/2013   K 4.4 10/17/2013   CL 107 03/02/2008   CREATININE 1.3 10/17/2013   BUN 17 10/17/2013   CO2 28 03/02/2008   TSH 0.09 (A) 10/17/2013   INR 1.0 06/09/2007       Assessment & Plan:   Problem List Items Addressed This Visit    Health care maintenance    Physical today 08/10/16.  Was seeing Dr Bernardo Heater.  Last seen 6/20917.  Prostate check here today.  Colonoscopy 2010.  Recommended f/u colonoscopy in 2020.        Hypercholesterolemia    Low cholesterol diet and exercise.  On lipitor.  Follow lipid panel and liver function tests.        Relevant Medications   atorvastatin (LIPITOR) 10 MG tablet   Low back pain - Primary    Persistent  pain as outlined.  Check xray.        Relevant Orders   DG Lumbar Spine 2-3 Views (Completed)   MR Lumbar Spine Wo Contrast   Obstructive sleep apnea    Continue cpap.       THYROID CANCER, HX OF    Followed by Dr Forde Dandy.            Einar Pheasant, MD

## 2016-08-15 DIAGNOSIS — G4733 Obstructive sleep apnea (adult) (pediatric): Secondary | ICD-10-CM | POA: Diagnosis not present

## 2016-08-19 ENCOUNTER — Encounter: Payer: Self-pay | Admitting: Internal Medicine

## 2016-08-19 DIAGNOSIS — M545 Low back pain, unspecified: Secondary | ICD-10-CM | POA: Insufficient documentation

## 2016-08-19 NOTE — Assessment & Plan Note (Signed)
Physical today 08/10/16.  Was seeing Dr Bernardo Heater.  Last seen 6/20917.  Prostate check here today.  Colonoscopy 2010.  Recommended f/u colonoscopy in 2020.

## 2016-08-19 NOTE — Assessment & Plan Note (Signed)
Low cholesterol diet and exercise.  On lipitor.  Follow lipid panel and liver function tests.   

## 2016-08-19 NOTE — Assessment & Plan Note (Signed)
Persistent pain as outlined.  Check xray.   

## 2016-08-19 NOTE — Assessment & Plan Note (Signed)
Followed by Dr South.   

## 2016-08-19 NOTE — Assessment & Plan Note (Signed)
Continue cpap.  

## 2016-08-21 ENCOUNTER — Telehealth: Payer: Self-pay | Admitting: Internal Medicine

## 2016-08-21 NOTE — Telephone Encounter (Signed)
Pt called about needing a Rx  for Vitamin D, Ergocalciferol, (DRISDOL) 50000 UNITS CAPS capsule with a 3 month supply.   Pharmacy is [facility], [address]  Call pt @ [phone]. Thank you!

## 2016-08-21 NOTE — Telephone Encounter (Signed)
Do not see where you have filled in the past ok to call in?

## 2016-08-22 NOTE — Telephone Encounter (Signed)
Left message to return call to our office.  

## 2016-08-22 NOTE — Telephone Encounter (Signed)
He just had labs through [facility].  They were placed to be scanned.  Not scanned in yet.  I think on those labs his vitamin D level was wnl.  If these have not been sent to scan and still in office need to review and make sure vitamin D level wnl.  If unable to find our copy, see if we can call [facility] and just get them to tell us his vitamin D level.  If wnl, notify pt that given wnl, he should not need to continue on high dose vitamin D.  If wnl, can start otc vitamin D3.

## 2016-08-22 NOTE — Telephone Encounter (Signed)
Given level is 48, I would have him stop the rx vitamin d and have him start vitamin D3 1000 units daily and see if he can maintain on this dose.

## 2016-08-22 NOTE — Telephone Encounter (Signed)
Had another report faxed over his vitamin d was 48.2. Am I ok to call and let him know that he does not need take a supplement at this time?

## 2016-08-23 ENCOUNTER — Ambulatory Visit (HOSPITAL_COMMUNITY)
Admission: RE | Admit: 2016-08-23 | Discharge: 2016-08-23 | Disposition: A | Discharge status: Home / Self Care | Payer: 59 | Source: Ambulatory Visit | Attending: Internal Medicine | Admitting: Internal Medicine

## 2016-08-23 DIAGNOSIS — M5442 Lumbago with sciatica, left side: Secondary | ICD-10-CM | POA: Insufficient documentation

## 2016-08-23 DIAGNOSIS — M5126 Other intervertebral disc displacement, lumbar region: Secondary | ICD-10-CM | POA: Diagnosis not present

## 2016-08-23 DIAGNOSIS — M48061 Spinal stenosis, lumbar region without neurogenic claudication: Secondary | ICD-10-CM | POA: Insufficient documentation

## 2016-08-23 DIAGNOSIS — M4807 Spinal stenosis, lumbosacral region: Secondary | ICD-10-CM | POA: Diagnosis not present

## 2016-08-23 MED ORDER — VITAMIN D3 25 MCG (1000 UNIT) PO TABS: 1000.0000 [IU] | ORAL_TABLET | Freq: Every day | ORAL | 1 refills | Status: DC

## 2016-08-23 NOTE — Telephone Encounter (Signed)
Patient was informed of  Dr.Scotts advise, pt understood and requested to have the vitamin D3 1000 unites sent to [facility] 90 day supply.  He also requested his MRI results when available  Pt contact [phone]

## 2016-08-23 NOTE — Telephone Encounter (Signed)
Can we call in this it is otc?

## 2016-08-23 NOTE — Telephone Encounter (Signed)
rx sent in for vitamin D3.  See result note for MRI results.

## 2016-08-24 ENCOUNTER — Other Ambulatory Visit: Payer: Self-pay | Admitting: Internal Medicine

## 2016-08-24 ENCOUNTER — Telehealth: Payer: Self-pay | Admitting: *Deleted

## 2016-08-24 DIAGNOSIS — M5442 Lumbago with sciatica, left side: Secondary | ICD-10-CM

## 2016-08-24 NOTE — Progress Notes (Signed)
Order placed for referral to neurosurgery.  

## 2016-08-24 NOTE — Telephone Encounter (Signed)
Pt called back returning Dr. Bary Leriche call. Please advise, thank you!  Call pt @ [phone]

## 2016-08-24 NOTE — Telephone Encounter (Signed)
Patient stated that Matthew Kelley has not received this Rx

## 2016-08-24 NOTE — Telephone Encounter (Signed)
Called script into [facility] pharmacy v/m

## 2016-08-31 ENCOUNTER — Ambulatory Visit: Payer: 59

## 2016-09-04 DIAGNOSIS — M5126 Other intervertebral disc displacement, lumbar region: Secondary | ICD-10-CM | POA: Diagnosis not present

## 2016-09-04 DIAGNOSIS — M5416 Radiculopathy, lumbar region: Secondary | ICD-10-CM | POA: Diagnosis not present

## 2016-11-21 DIAGNOSIS — G4733 Obstructive sleep apnea (adult) (pediatric): Secondary | ICD-10-CM | POA: Diagnosis not present

## 2016-12-28 ENCOUNTER — Other Ambulatory Visit: Payer: Self-pay | Admitting: Internal Medicine

## 2017-02-08 DIAGNOSIS — H43812 Vitreous degeneration, left eye: Secondary | ICD-10-CM | POA: Diagnosis not present

## 2017-02-08 DIAGNOSIS — H35372 Puckering of macula, left eye: Secondary | ICD-10-CM | POA: Diagnosis not present

## 2017-02-08 DIAGNOSIS — H353132 Nonexudative age-related macular degeneration, bilateral, intermediate dry stage: Secondary | ICD-10-CM | POA: Diagnosis not present

## 2017-02-08 DIAGNOSIS — H43392 Other vitreous opacities, left eye: Secondary | ICD-10-CM | POA: Diagnosis not present

## 2017-03-22 DIAGNOSIS — H35372 Puckering of macula, left eye: Secondary | ICD-10-CM | POA: Diagnosis not present

## 2017-03-22 DIAGNOSIS — H25043 Posterior subcapsular polar age-related cataract, bilateral: Secondary | ICD-10-CM | POA: Diagnosis not present

## 2017-03-22 DIAGNOSIS — H2513 Age-related nuclear cataract, bilateral: Secondary | ICD-10-CM | POA: Diagnosis not present

## 2017-03-22 DIAGNOSIS — H2511 Age-related nuclear cataract, right eye: Secondary | ICD-10-CM | POA: Diagnosis not present

## 2017-03-22 DIAGNOSIS — H25013 Cortical age-related cataract, bilateral: Secondary | ICD-10-CM | POA: Diagnosis not present

## 2017-04-03 DIAGNOSIS — G4733 Obstructive sleep apnea (adult) (pediatric): Secondary | ICD-10-CM | POA: Diagnosis not present

## 2017-04-26 DIAGNOSIS — H2512 Age-related nuclear cataract, left eye: Secondary | ICD-10-CM | POA: Diagnosis not present

## 2017-04-26 DIAGNOSIS — H2511 Age-related nuclear cataract, right eye: Secondary | ICD-10-CM | POA: Diagnosis not present

## 2017-05-10 DIAGNOSIS — H2512 Age-related nuclear cataract, left eye: Secondary | ICD-10-CM | POA: Diagnosis not present

## 2017-06-25 ENCOUNTER — Encounter: Payer: Self-pay | Admitting: Internal Medicine

## 2017-07-02 DIAGNOSIS — G4733 Obstructive sleep apnea (adult) (pediatric): Secondary | ICD-10-CM | POA: Diagnosis not present

## 2017-07-12 ENCOUNTER — Other Ambulatory Visit: Payer: Self-pay | Admitting: Internal Medicine

## 2017-07-12 ENCOUNTER — Telehealth: Payer: Self-pay | Admitting: Internal Medicine

## 2017-07-12 DIAGNOSIS — E559 Vitamin D deficiency, unspecified: Secondary | ICD-10-CM | POA: Diagnosis not present

## 2017-07-12 DIAGNOSIS — C73 Malignant neoplasm of thyroid gland: Secondary | ICD-10-CM | POA: Diagnosis not present

## 2017-07-12 DIAGNOSIS — E291 Testicular hypofunction: Secondary | ICD-10-CM | POA: Diagnosis not present

## 2017-07-12 DIAGNOSIS — E890 Postprocedural hypothyroidism: Secondary | ICD-10-CM | POA: Diagnosis not present

## 2017-07-12 NOTE — Telephone Encounter (Unsigned)
Copied from [place]. Topic: Appointment Scheduling - Scheduling Inquiry for Clinic >> Jul 12, 2017 12:26 PM Hewitt Shorts wrote: CRM for notification. See Telephone encounter for: 07/12/17.pt is needing to have his CPAE rescheduled from 08/23/17 due to the provider needing to change her schedule but nothing else is available untl after July and patient has [employer] insurance and needs a CPE before July 2019     Best number to call [phone]

## 2017-07-15 LAB — VITAMIN D 25 HYDROXY (VIT D DEFICIENCY, FRACTURES): Vit D, 25-Hydroxy: 35.2

## 2017-07-15 LAB — LIPID PANEL
Cholesterol: 168 (ref 0–200)
HDL: 41 (ref 35–70)
LDL Cholesterol: 111
Triglycerides: 79 (ref 40–160)

## 2017-07-15 LAB — BASIC METABOLIC PANEL
BUN: 21 (ref 4–21)
Creatinine: 1.2 (ref 0.6–1.3)
Glucose: 110
Potassium: 4.1 (ref 3.4–5.3)
Sodium: 142 (ref 137–147)

## 2017-07-15 LAB — HEPATIC FUNCTION PANEL
ALT: 22 (ref 10–40)
AST: 18 (ref 14–40)
Alkaline Phosphatase: 93 (ref 25–125)
Bilirubin, Total: 0.5

## 2017-07-15 LAB — TSH: TSH: 0.16 — AB (ref 0.41–5.90)

## 2017-07-15 LAB — CBC AND DIFFERENTIAL
HCT: 39 — AB (ref 41–53)
Hemoglobin: 13.4 — AB (ref 13.5–17.5)
Neutrophils Absolute: 2
Platelets: 222 (ref 150–399)
WBC: 4.8

## 2017-07-15 LAB — PSA: PSA: 0.3

## 2017-07-18 ENCOUNTER — Telehealth: Payer: Self-pay

## 2017-07-18 NOTE — Telephone Encounter (Signed)
Lm on cell and home to  call back and schedule appt

## 2017-07-18 NOTE — Telephone Encounter (Signed)
Patient contacted [facility] pharmacy requesting refill on Vit D 50,000 units. It does not show that you have prescribed this recently but I do see where you have prescribed the 1,000 unit Vit D. Ok to fill the 50,000 unit caps?

## 2017-07-19 NOTE — Telephone Encounter (Signed)
Please call and notify Dr Nehemiah Massed that we need to have labs to see if he still needs to be on this dose of vitamin D.  Also, I would like to recheck his cholesterol.  If he is agreeable, let me know and I will place order for labs.  Please confirm that he still goes to [facility] for his labs.

## 2017-07-22 NOTE — Telephone Encounter (Signed)
Patient said that he faxed his labs on 4/12.  He said the labs have Vitamin D and Cholesterol as well. After you review them , please contact patient @ [phone].

## 2017-07-22 NOTE — Telephone Encounter (Signed)
LMTCB

## 2017-07-22 NOTE — Telephone Encounter (Signed)
Labs placed in your results folder for review

## 2017-07-23 NOTE — Telephone Encounter (Signed)
Call and notify pt that his vitamin D level is within the normal range.  Is he taking the 50,000 units q week and has he been taking regularly.  If so, then I want him to remain on this medication and I can send in prescription.  Sugar is slightly increased.  I would like to recheck fasting glucose and a1c in 4 weeks.  Cholesterol is slightly increased from some of the previous checks.  Please confirm he is taking his lipitor 10mg  daily.  If not, needs to start taking daily.  PSA ok.  hgb wnl.  (previous thyroid has been followed by Dr Forde Dandy.  Did he forward labs to Dr Forde Dandy).

## 2017-07-23 NOTE — Telephone Encounter (Signed)
Patient advised of below and verbalized understanding.  He states he is only taking Vitamin D 1000 units daily since last year  .  He would like to go back to Vitamin D 50,000 units weekly , please send in script to [facility] 90 days if he needs to go back on . He is taking Lipitor 10 mg daily.   Appointment scheduled for labs in 4 weeks.   He will forwards labs to Dr Forde Dandy.

## 2017-07-23 NOTE — Telephone Encounter (Signed)
Called number pt left yesterday. No answer and unable to leave voicemail due to it being full.,

## 2017-07-23 NOTE — Telephone Encounter (Signed)
Given that his vitamin D level is ok on 1000 units per day, would continue on the 1000 daily and hold on the high dose.  Thanks

## 2017-07-23 NOTE — Telephone Encounter (Signed)
Left voice mail to call back ok for PEC to speak to patient 

## 2017-07-26 NOTE — Telephone Encounter (Signed)
Pt given information per Dr Nicki Reaper, "Given that his vitamin D level is ok on 1000 units per day, would continue on the 1000 daily and hold on the high dose.  Thanks" ; he verbalizes understanding; he sates that he would like to remove his home phone [phone] and work phone [phone] should be his only contact number and he states that detailed voice messages can be left at this number.

## 2017-07-30 ENCOUNTER — Encounter: Payer: Self-pay | Admitting: Internal Medicine

## 2017-08-22 ENCOUNTER — Other Ambulatory Visit: Payer: Self-pay | Admitting: Internal Medicine

## 2017-08-22 ENCOUNTER — Telehealth: Payer: Self-pay | Admitting: Radiology

## 2017-08-22 DIAGNOSIS — R739 Hyperglycemia, unspecified: Secondary | ICD-10-CM

## 2017-08-22 NOTE — Telephone Encounter (Signed)
Order placed for labs.

## 2017-08-22 NOTE — Telephone Encounter (Signed)
Pt coming in for labs tomorrow, please place future orders. Thank you.  

## 2017-08-22 NOTE — Progress Notes (Signed)
Orders placed for f/u fasting glucose and a1c

## 2017-08-23 ENCOUNTER — Other Ambulatory Visit: Payer: Self-pay | Admitting: Internal Medicine

## 2017-08-23 ENCOUNTER — Encounter: Payer: 59 | Admitting: Internal Medicine

## 2017-08-23 ENCOUNTER — Other Ambulatory Visit: Payer: Self-pay | Admitting: Radiology

## 2017-08-23 ENCOUNTER — Other Ambulatory Visit (INDEPENDENT_AMBULATORY_CARE_PROVIDER_SITE_OTHER): Payer: 59

## 2017-08-23 ENCOUNTER — Telehealth: Payer: Self-pay | Admitting: Radiology

## 2017-08-23 DIAGNOSIS — R739 Hyperglycemia, unspecified: Secondary | ICD-10-CM

## 2017-08-23 DIAGNOSIS — Z0000 Encounter for general adult medical examination without abnormal findings: Secondary | ICD-10-CM

## 2017-08-23 NOTE — Telephone Encounter (Signed)
We can just use routine screening zoo.o.  I am not sure which test to order.  Can you help with this.  Want Lyme antibody - IgG and IgM (the ones available that I can see are [facility] labs).  Can you order this for me with the one other physicians use.  Thanks.

## 2017-08-23 NOTE — Addendum Note (Signed)
Addended by: Arby Barrette on: 08/23/2017 08:41 AM   Modules accepted: Orders

## 2017-08-23 NOTE — Telephone Encounter (Signed)
I am ok to order, but need a diagnosis to put in.  Do you know why he wanted the test run.  Thanks

## 2017-08-23 NOTE — Telephone Encounter (Signed)
Pt came in today for labs and asked if he could have Lyme Disease Titer drawn. Advised pt PCP was not in the office today but I would send a message to see. 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 Drew for Lyme Disease Titer incase PCP ok'd to send out.

## 2017-08-23 NOTE — Telephone Encounter (Signed)
Pt did not state why he needed test ran, just stated he wanted it.

## 2017-08-23 NOTE — Telephone Encounter (Signed)
Ok test ordered with diagnosis given. Thank you

## 2017-08-23 NOTE — Addendum Note (Signed)
Addended by: Arby Barrette on: 08/23/2017 10:03 AM   Modules accepted: Orders

## 2017-08-24 LAB — HEMOGLOBIN A1C
Hgb A1c MFr Bld: 5.4 % of total Hgb (ref <5.7)
Mean Plasma Glucose: 108 (calc)
eAG (mmol/L): 6 (calc)

## 2017-08-24 LAB — GLUCOSE, FASTING: Glucose, Plasma: 111 mg/dL — ABNORMAL HIGH (ref 65–99)

## 2017-08-28 LAB — LYME DISEASE, WESTERN BLOT
IgG P18 Ab.: ABSENT
IgG P23 Ab.: ABSENT
IgG P28 Ab.: ABSENT
IgG P30 Ab.: ABSENT
IgG P39 Ab.: ABSENT
IgG P41 Ab.: ABSENT
IgG P45 Ab.: ABSENT
IgG P58 Ab.: ABSENT
IgG P66 Ab.: ABSENT
IgG P93 Ab.: ABSENT
IgM P23 Ab.: ABSENT
IgM P39 Ab.: ABSENT
IgM P41 Ab.: ABSENT
Lyme IgG Wb: NEGATIVE
Lyme IgM Wb: NEGATIVE

## 2017-09-20 ENCOUNTER — Encounter: Payer: Self-pay | Admitting: Urology

## 2017-09-20 ENCOUNTER — Ambulatory Visit (INDEPENDENT_AMBULATORY_CARE_PROVIDER_SITE_OTHER): Payer: 59 | Admitting: Urology

## 2017-09-20 VITALS — BP 153/81 | HR 75 | Ht 72.0 in | Wt 198.8 lb

## 2017-09-20 DIAGNOSIS — N451 Epididymitis: Secondary | ICD-10-CM

## 2017-09-20 DIAGNOSIS — N5089 Other specified disorders of the male genital organs: Secondary | ICD-10-CM | POA: Diagnosis not present

## 2017-09-20 LAB — URINALYSIS, COMPLETE
Bilirubin, UA: NEGATIVE
Glucose, UA: NEGATIVE
Ketones, UA: NEGATIVE
Leukocytes, UA: NEGATIVE
Nitrite, UA: NEGATIVE
Protein, UA: NEGATIVE
RBC, UA: NEGATIVE
Specific Gravity, UA: 1.015 (ref 1.005–1.030)
Urobilinogen, Ur: 0.2 mg/dL (ref 0.2–1.0)
pH, UA: 7.5 (ref 5.0–7.5)

## 2017-09-20 LAB — MICROSCOPIC EXAMINATION
Epithelial Cells (non renal): NONE SEEN /hpf (ref 0–10)
WBC, UA: NONE SEEN /hpf (ref 0–5)

## 2017-09-20 MED ORDER — SILDENAFIL CITRATE 20 MG PO TABS: ORAL_TABLET | ORAL | 3 refills | Status: DC

## 2017-09-20 NOTE — Progress Notes (Signed)
09/20/2017 3:10 PM   Matthew Kelley [birth year] [id number]  Referring provider: Einar Pheasant, [address] [place], [place] [address]  Chief Complaint  Patient presents with  . Other    HPI: 57 year old male who last week noted swelling of his left hemiscrotum with mild discomfort.  He has a previous history of vasectomy.  He states the swelling has significantly decreased and he wanted to get checked.  He has no voiding complaints.    He also has mild difficulty achieving and maintaining an erection and was inquiring about a trial of a PDE 5 inhibitor.   PMH: Past Medical History:  Diagnosis Date  . Blood in stool    h/o  . Cancer [facility])    thyroid cancer  . History of chicken pox   . Hypercholesterolemia   . Sleep apnea   . Vertigo     Surgical History: Past Surgical History:  Procedure Laterality Date  . CHOLECYSTECTOMY  2000  . TOTAL THYROIDECTOMY  2009    Home Medications:  Allergies as of 09/20/2017   No Known Allergies     Medication List        Accurate as of 09/20/17  3:10 PM. Always use your most recent med list.          atorvastatin 10 MG tablet Commonly known as:  LIPITOR TAKE 1 TABLET BY MOUTH DAILY   cholecalciferol 1000 units tablet Commonly known as:  VITAMIN D Take 1 tablet (1,000 Units total) by mouth daily.   finasteride 1 MG tablet Commonly known as:  PROPECIA Take 1 tablet (1 mg total) by mouth daily.   levothyroxine 175 MCG tablet Commonly known as:  SYNTHROID, LEVOTHROID Take 1 tablet (175 mcg total) by mouth daily before breakfast.   RESTASIS 0.05 % ophthalmic emulsion Generic drug:  cycloSPORINE instill 1 drop into both eyes twice a day       Allergies: No Known Allergies  Family History: Family History  Problem Relation Age of Onset  . Hyperlipidemia Father   . Heart disease Father     Social History:  reports that he has never smoked. He has never used smokeless tobacco. He reports  that he drinks alcohol. He reports that he does not use drugs.  ROS: UROLOGY Frequent Urination?: No Hard to postpone urination?: No Burning/pain with urination?: No Get up at night to urinate?: No Leakage of urine?: No Urine stream starts and stops?: No Trouble starting stream?: No Do you have to strain to urinate?: No Blood in urine?: No Urinary tract infection?: No Sexually transmitted disease?: No Injury to kidneys or bladder?: No Painful intercourse?: No Weak stream?: No Erection problems?: No Penile pain?: No  Gastrointestinal Nausea?: No Vomiting?: No Indigestion/heartburn?: No Diarrhea?: No Constipation?: No  Constitutional Fever: No Night sweats?: No Weight loss?: No Fatigue?: No  Skin Skin rash/lesions?: No Itching?: No  Eyes Blurred vision?: No Double vision?: No  Ears/Nose/Throat Sore throat?: No Sinus problems?: No  Hematologic/Lymphatic Swollen glands?: No Easy bruising?: No  Cardiovascular Leg swelling?: No Chest pain?: No  Respiratory Cough?: No Shortness of breath?: No  Endocrine Excessive thirst?: No  Musculoskeletal Back pain?: Yes Joint pain?: No  Neurological Headaches?: No Dizziness?: No  Psychologic Depression?: No Anxiety?: No  Physical Exam: BP (!) 153/81 (BP Location: Left Arm, Patient Position: Sitting, Cuff Size: Normal)   Pulse 75   Ht 6' (1.829 m)   Wt 198 lb 12.8 oz (90.2 kg)   BMI 26.96 kg/m  Constitutional:  Alert and oriented, No acute distress. HEENT: [employer] AT, moist mucus membranes.  Trachea midline, no masses. Cardiovascular: No clubbing, cyanosis, or edema. Respiratory: Normal respiratory effort, no increased work of breathing. GI: Abdomen is soft, nontender, nondistended, no abdominal masses GU: No CVA tenderness penis circumcised without lesions.  Testes descended bilateral without masses or tenderness.. There is mild fullness to the left globus minor which she states was the area of previous  swelling. Lymph: No cervical or inguinal lymphadenopathy. Skin: No rashes, bruises or suspicious lesions. Neurologic: Grossly intact, no focal deficits, moving all 4 extremities. Psychiatric: Normal mood and affect.   Assessment & Plan:   Clinical history consistent with a postvasectomy epididymitis which is resolving.  No testis masses appreciated.  Scrotal ultrasound would be low yield.  He was interested in a trial of generic sildenafil and he was given a printed Rx.  Return if symptoms worsen or fail to improve.  Abbie Sons, [place] [address] [place], [place] [address] [phone]

## 2017-09-27 ENCOUNTER — Ambulatory Visit: Payer: Self-pay | Admitting: Family Medicine

## 2017-09-27 VITALS — BP 124/84 | HR 69 | Temp 98.0°F | Resp 20 | Ht 73.0 in | Wt 198.0 lb

## 2017-09-27 DIAGNOSIS — H35372 Puckering of macula, left eye: Secondary | ICD-10-CM | POA: Diagnosis not present

## 2017-09-27 DIAGNOSIS — H353132 Nonexudative age-related macular degeneration, bilateral, intermediate dry stage: Secondary | ICD-10-CM | POA: Diagnosis not present

## 2017-09-27 DIAGNOSIS — H43812 Vitreous degeneration, left eye: Secondary | ICD-10-CM | POA: Diagnosis not present

## 2017-09-27 DIAGNOSIS — Z0000 Encounter for general adult medical examination without abnormal findings: Secondary | ICD-10-CM

## 2017-09-27 DIAGNOSIS — H43392 Other vitreous opacities, left eye: Secondary | ICD-10-CM | POA: Diagnosis not present

## 2017-09-27 NOTE — Progress Notes (Addendum)
Patient ID: Matthew Kelley, male    DOB: 03-18-1961, 57 y.o.   MRN: [id number]  PCP: Einar Pheasant, MD  Chief Complaint  Patient presents with  . Annual Exam    wellness exam     Subjective:  HPI  Matthew Kelley is a 57 y.o. male presents for evaluation wellness exam, to satisfy employer health sponsored insurance requirements.  He is followed closely by PCP. Medical history significant for thyroid cancer, hyperlipidemia,and low T.He recently received fasting labs by his PCP which have all been addressed. 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 He is active of routine physical activity and reports a diet low in carbohydrates and sodium. He denies any complaints today. Social History   Socioeconomic History  . Marital status: Married    Spouse name: Not on file  . Number of children: Not on file  . Years of education: Not on file  . Highest education level: Not on file  Occupational History  . Not on file  Social Needs  . Financial resource strain: Not on file  . Food insecurity:    Worry: Not on file    Inability: Not on file  . Transportation needs:    Medical: Not on file    Non-medical: Not on file  Tobacco Use  . Smoking status: Never Smoker  . Smokeless tobacco: Never Used  Substance and Sexual Activity  . Alcohol use: Yes    Alcohol/week: 0.0 oz  . Drug use: No  . Sexual activity: Not on file  Lifestyle  . Physical activity:    Days per week: Not on file    Minutes per session: Not on file  . Stress: Not on file  Relationships  . Social connections:    Talks on phone: Not on file    Gets together: Not on file    Attends religious service: Not on file    Active member of club or organization: Not on file    Attends meetings of clubs or organizations: Not on file    Relationship status: Not on file  . Intimate partner violence:    Fear of current or ex partner: Not on file    Emotionally abused: Not on file    Physically abused:  Not on file    Forced sexual activity: Not on file  Other Topics Concern  . Not on file  Social History Narrative  . Not on file    Family History  Problem Relation Age of Onset  . Hyperlipidemia Father   . Heart disease Father    Review of Systems Pertinent negatives listed in HPI  Patient Active Problem List   Diagnosis Date Noted  . Low back pain 08/19/2016  . Health care maintenance 07/30/2014  . Hypercholesterolemia 08/01/2013  . Hemangioma 08/01/2013  . Right hip pain 08/01/2013  . Low testosterone 08/01/2013  . Detached retina 08/01/2013  . Obstructive sleep apnea 06/10/2009  . THYROID CANCER, HX OF 06/10/2009    No Known Allergies  Prior to Admission medications   Medication Sig Start Date End Date Taking? Authorizing Provider  atorvastatin (LIPITOR) 10 MG tablet TAKE 1 TABLET BY MOUTH DAILY 07/12/17  Yes Einar Pheasant, MD  cholecalciferol (VITAMIN D) 1000 units tablet Take 1 tablet (1,000 Units total) by mouth daily. 08/23/16  Yes Einar Pheasant, MD  finasteride (PROPECIA) 1 MG tablet Take 1 tablet (1 mg total) by mouth daily. 12/26/15  Yes Einar Pheasant, MD  levothyroxine (SYNTHROID, LEVOTHROID) 175 MCG tablet  Take 1 tablet (175 mcg total) by mouth daily before breakfast. 12/26/15  Yes Einar Pheasant, MD  RESTASIS 0.05 % ophthalmic emulsion instill 1 drop into both eyes twice a day 07/26/15  Yes [provider]  sildenafil (REVATIO) 20 MG tablet 2-5 tabs 1 hour prior to intercourse Patient not taking: Reported on 09/27/2017 09/20/17   Abbie Sons, MD    Past Medical, Surgical Family and Social History reviewed and updated.    Objective:   Today's Vitals   09/27/17 1136  BP: 124/84  Pulse: 69  Resp: 20  Temp: 98 F (36.7 C)  TempSrc: Oral  SpO2: 100%  Weight: 198 lb (89.8 kg)  Height: 6\' 1"  (1.854 m)    Wt Readings from Last 3 Encounters:  09/20/17 198 lb 12.8 oz (90.2 kg)  08/10/16 198 lb 12.8 oz (90.2 kg)  08/05/15 194 lb 9.6 oz  (88.3 kg)    Physical Exam  Constitutional: He is oriented to person, place, and time. He appears well-developed and well-nourished.  HENT:  Head: Normocephalic and atraumatic.  Eyes: Pupils are equal, round, and reactive to light. Conjunctivae and EOM are normal.  Neck:  Nodularity noted of the thyroid.-hx of thyroid cancer (successfully treated) managed by PCP  Cardiovascular: Normal rate, regular rhythm, normal heart sounds and intact distal pulses.  Pulmonary/Chest: Effort normal and breath sounds normal.  Lymphadenopathy:    He has no cervical adenopathy.  Neurological: He is alert and oriented to person, place, and time. He displays normal reflexes. No sensory deficit.  Skin: Skin is warm and dry.  Psychiatric: He has a normal mood and affect. His behavior is normal. Judgment and thought content normal.     Office Visit from 09/27/2017 in [facility] Department/Unit               Time: 10:12 AM  PHQ-9 Total Score  1     Assessment & Plan:  1. Wellness examination -Age appropriate anticipatory guidance provided. Continue with upcoming appointment with PCP. - urinalysis dipstick, negative. Eye exam deferred-patient's eyes are dilated from ophthalmic exam today.  If symptoms worsen or do not improve, return for follow-up, follow-up with PCP, or at the emergency department if severity of symptoms warrant a higher level of care.     Carroll Sage. Kenton Kingfisher, MSN, FNP-C [facility]  [place], [place] [address] [phone]

## 2017-09-27 NOTE — Patient Instructions (Signed)
Nice meeting you today! Your my active health will update with today's visit within 24 hours.    Health Maintenance, Male A healthy lifestyle and preventive care is important for your health and wellness. Ask your health care provider about what schedule of regular examinations is right for you. What should I know about weight and diet? Eat a Healthy Diet  Eat plenty of vegetables, fruits, whole grains, low-fat dairy products, and lean protein.  Do not eat a lot of foods high in solid fats, added sugars, or salt.  Maintain a Healthy Weight Regular exercise can help you achieve or maintain a healthy weight. You should:  Do at least 150 minutes of exercise each week. The exercise should increase your heart rate and make you sweat (moderate-intensity exercise).  Do strength-training exercises at least twice a week.  Watch Your Levels of Cholesterol and Blood Lipids  Have your blood tested for lipids and cholesterol every 5 years starting at 59 years of age. If you are at high risk for heart disease, you should start having your blood tested when you are 59 years old. You may need to have your cholesterol levels checked more often if: ? Your lipid or cholesterol levels are high. ? You are older than 59 years of age. ? You are at high risk for heart disease.  What should I know about cancer screening? Many types of cancers can be detected early and may often be prevented. Lung Cancer  You should be screened every year for lung cancer if: ? You are a current smoker who has smoked for at least 30 years. ? You are a former smoker who has quit within the past 15 years.  Talk to your health care provider about your screening options, when you should start screening, and how often you should be screened.  Colorectal Cancer  Routine colorectal cancer screening usually begins at 59 years of age and should be repeated every 5-10 years until you are 59 years old. You may need to be screened  more often if early forms of precancerous polyps or small growths are found. Your health care provider may recommend screening at an earlier age if you have risk factors for colon cancer.  Your health care provider may recommend using home test kits to check for hidden blood in the stool.  A small camera at the end of a tube can be used to examine your colon (sigmoidoscopy or colonoscopy). This checks for the earliest forms of colorectal cancer.  Prostate and Testicular Cancer  Depending on your age and overall health, your health care provider may do certain tests to screen for prostate and testicular cancer.  Talk to your health care provider about any symptoms or concerns you have about testicular or prostate cancer.  Skin Cancer  Check your skin from head to toe regularly.  Tell your health care provider about any new moles or changes in moles, especially if: ? There is a change in a mole's size, shape, or color. ? You have a mole that is larger than a pencil eraser.  Always use sunscreen. Apply sunscreen liberally and repeat throughout the day.  Protect yourself by wearing long sleeves, pants, a wide-brimmed hat, and sunglasses when outside.  What should I know about heart disease, diabetes, and high blood pressure?  If you are 7-24 years of age, have your blood pressure checked every 3-5 years. If you are 54 years of age or older, have your blood pressure checked every year.  You should have your blood pressure measured twice-once when you are at a hospital or clinic, and once when you are not at a hospital or clinic. Record the average of the two measurements. To check your blood pressure when you are not at a hospital or clinic, you can use: ? An automated blood pressure machine at a pharmacy. ? A home blood pressure monitor.  Talk to your health care provider about your target blood pressure.  If you are between 87-100 years old, ask your health care provider if you should  take aspirin to prevent heart disease.  Have regular diabetes screenings by checking your fasting blood sugar level. ? If you are at a normal weight and have a low risk for diabetes, have this test once every three years after the age of 28. ? If you are overweight and have a high risk for diabetes, consider being tested at a younger age or more often.  A one-time screening for abdominal aortic aneurysm (AAA) by ultrasound is recommended for men aged 53-75 years who are current or former smokers. What should I know about preventing infection? Hepatitis B If you have a higher risk for hepatitis B, you should be screened for this virus. Talk with your health care provider to find out if you are at risk for hepatitis B infection. Hepatitis C Blood testing is recommended for:  Everyone born from [birth year] through 1967.  Anyone with known risk factors for hepatitis C.  Sexually Transmitted Diseases (STDs)  You should be screened each year for STDs including gonorrhea and chlamydia if: ? You are sexually active and are younger than 59 years of age. ? You are older than 59 years of age and your health care provider tells you that you are at risk for this type of infection. ? Your sexual activity has changed since you were last screened and you are at an increased risk for chlamydia or gonorrhea. Ask your health care provider if you are at risk.  Talk with your health care provider about whether you are at high risk of being infected with HIV. Your health care provider may recommend a prescription medicine to help prevent HIV infection.  What else can I do?  Schedule regular health, dental, and eye exams.  Stay current with your vaccines (immunizations).  Do not use any tobacco products, such as cigarettes, chewing tobacco, and e-cigarettes. If you need help quitting, ask your health care provider.  Limit alcohol intake to no more than 2 drinks per day. One drink equals 12 ounces of beer, 5  ounces of wine, or 1 ounces of hard liquor.  Do not use street drugs.  Do not share needles.  Ask your health care provider for help if you need support or information about quitting drugs.  Tell your health care provider if you often feel depressed.  Tell your health care provider if you have ever been abused or do not feel safe at home. This information is not intended to replace advice given to you by your health care provider. Make sure you discuss any questions you have with your health care provider. Document Released: 09/22/2007 Document Revised: 11/23/2015 Document Reviewed: 12/28/2014 Elsevier Interactive Patient Education  [employer].

## 2017-10-14 ENCOUNTER — Telehealth: Payer: Self-pay | Admitting: *Deleted

## 2017-10-14 NOTE — Telephone Encounter (Signed)
Checking to see if you called him. I can not find anything documented either.

## 2017-10-14 NOTE — Telephone Encounter (Signed)
Copied from [place] [id number]. Topic: Inquiry >> Oct 11, 2017  5:05 PM Oliver Pila B wrote: Reason for CRM: pt called to return a phone call, no documentation was made, contact pt if needed

## 2017-10-14 NOTE — Telephone Encounter (Signed)
I have not called him.  Thanks

## 2017-10-24 DIAGNOSIS — G4733 Obstructive sleep apnea (adult) (pediatric): Secondary | ICD-10-CM | POA: Diagnosis not present

## 2017-12-06 ENCOUNTER — Ambulatory Visit (INDEPENDENT_AMBULATORY_CARE_PROVIDER_SITE_OTHER): Payer: 59 | Admitting: Internal Medicine

## 2017-12-06 ENCOUNTER — Encounter: Payer: Self-pay | Admitting: Internal Medicine

## 2017-12-06 VITALS — BP 138/88 | HR 76 | Temp 97.6°F | Resp 18 | Ht 73.0 in | Wt 196.0 lb

## 2017-12-06 DIAGNOSIS — R739 Hyperglycemia, unspecified: Secondary | ICD-10-CM

## 2017-12-06 DIAGNOSIS — G4733 Obstructive sleep apnea (adult) (pediatric): Secondary | ICD-10-CM

## 2017-12-06 DIAGNOSIS — E7800 Pure hypercholesterolemia, unspecified: Secondary | ICD-10-CM | POA: Diagnosis not present

## 2017-12-06 DIAGNOSIS — Z0000 Encounter for general adult medical examination without abnormal findings: Secondary | ICD-10-CM

## 2017-12-06 DIAGNOSIS — Z85850 Personal history of malignant neoplasm of thyroid: Secondary | ICD-10-CM

## 2017-12-06 DIAGNOSIS — M5442 Lumbago with sciatica, left side: Secondary | ICD-10-CM | POA: Diagnosis not present

## 2017-12-06 NOTE — Progress Notes (Addendum)
Patient ID: Jonothan Heberle, male   DOB: [birth year], 57 y.o.   MRN: [id number]   Subjective:    Patient ID: Dajohn Ellender, male    DOB: 1960/09/14, 57 y.o.   MRN: [id number]  HPI  Patient here for his physical exam.  States he is doing well.  Stays active.  Is exercising.  No chest pain.  No sob.  No acid reflux.  No abdominal pain.  Bowels moving.  No blood.  Has experienced some back pain.  Persistent.  Had MRI previously which revealed small right extraforaminal disc protrusion L3-4 and mild spinal stenosis L4-5.  Small extraforaminal disc protrusion on the left.  Saw neurosurgery previously.  Did not feel surgery warranted at that time.  Discussed therapy.  Takes motrin prn.  Saw Dr Bernardo Heater recently.  Handling stress.  Blood pressure on recheck today improved.     Past Medical History:  Diagnosis Date  . Blood in stool    h/o  . Cancer [facility])    thyroid cancer  . History of chicken pox   . Hypercholesterolemia   . Sleep apnea   . Vertigo    Past Surgical History:  Procedure Laterality Date  . CHOLECYSTECTOMY  2000  . TOTAL THYROIDECTOMY  2009   Family History  Problem Relation Age of Onset  . Hyperlipidemia Father   . Heart disease Father    Social History   Socioeconomic History  . Marital status: Married    Spouse name: Not on file  . Number of children: Not on file  . Years of education: Not on file  . Highest education level: Not on file  Occupational History  . Not on file  Social Needs  . Financial resource strain: Not on file  . Food insecurity:    Worry: Not on file    Inability: Not on file  . Transportation needs:    Medical: Not on file    Non-medical: Not on file  Tobacco Use  . Smoking status: Never Smoker  . Smokeless tobacco: Never Used  Substance and Sexual Activity  . Alcohol use: Yes    Alcohol/week: 0.0 standard drinks  . Drug use: No  . Sexual activity: Not on file  Lifestyle  . Physical activity:    Days per week: Not on file    Minutes  per session: Not on file  . Stress: Not on file  Relationships  . Social connections:    Talks on phone: Not on file    Gets together: Not on file    Attends religious service: Not on file    Active member of club or organization: Not on file    Attends meetings of clubs or organizations: Not on file    Relationship status: Not on file  Other Topics Concern  . Not on file  Social History Narrative  . Not on file    Outpatient Encounter Medications as of 12/06/2017  Medication Sig  . atorvastatin (LIPITOR) 10 MG tablet TAKE 1 TABLET BY MOUTH DAILY  . cholecalciferol (VITAMIN D) 1000 units tablet Take 1 tablet (1,000 Units total) by mouth daily.  . finasteride (PROPECIA) 1 MG tablet Take 1 tablet (1 mg total) by mouth daily.  Marland Kitchen levothyroxine (SYNTHROID, LEVOTHROID) 175 MCG tablet Take 1 tablet (175 mcg total) by mouth daily before breakfast.  . RESTASIS 0.05 % ophthalmic emulsion instill 1 drop into both eyes twice a day  . sildenafil (REVATIO) 20 MG tablet 2-5 tabs 1 hour prior to intercourse (  Patient not taking: Reported on 09/27/2017)   No facility-administered encounter medications on file as of 12/06/2017.     Review of Systems  Constitutional: Negative for appetite change and unexpected weight change.  HENT: Negative for congestion and sinus pressure.   Eyes: Negative for pain and visual disturbance.  Respiratory: Negative for cough, chest tightness and shortness of breath.   Cardiovascular: Negative for chest pain, palpitations and leg swelling.  Gastrointestinal: Negative for abdominal pain, diarrhea, nausea and vomiting.  Genitourinary: Negative for difficulty urinating and dysuria.  Musculoskeletal: Negative for joint swelling and myalgias.  Skin: Negative for color change and rash.  Neurological: Negative for dizziness, light-headedness and headaches.  Hematological: Negative for adenopathy. Does not bruise/bleed easily.  Psychiatric/Behavioral: Negative for agitation  and dysphoric mood.       Objective:    Physical Exam  Constitutional: He is oriented to person, place, and time. He appears well-developed and well-nourished. No distress.  HENT:  Head: Normocephalic and atraumatic.  Nose: Nose normal.  Mouth/Throat: Oropharynx is clear and moist. No oropharyngeal exudate.  Eyes: Conjunctivae are normal. Right eye exhibits no discharge. Left eye exhibits no discharge.  Neck: Neck supple. No thyromegaly present.  Cardiovascular: Normal rate and regular rhythm.  Pulmonary/Chest: Breath sounds normal. No respiratory distress. He has no wheezes.  Abdominal: Soft. Bowel sounds are normal. There is no tenderness.  Genitourinary:  Genitourinary Comments: Not performed.   Musculoskeletal: He exhibits no edema or tenderness.  Lymphadenopathy:    He has no cervical adenopathy.  Neurological: He is alert and oriented to person, place, and time.  Skin: No rash noted. No erythema.  Psychiatric: He has a normal mood and affect. His behavior is normal.    BP 138/88 (BP Location: Left Arm, Patient Position: Sitting, Cuff Size: Large)   Pulse 76   Temp 97.6 F (36.4 C) (Oral)   Resp 18   Ht 6\' 1"  (1.854 m)   Wt 196 lb (88.9 kg)   SpO2 99%   BMI 25.86 kg/m  Wt Readings from Last 3 Encounters:  12/06/17 196 lb (88.9 kg)  09/27/17 198 lb (89.8 kg)  09/20/17 198 lb 12.8 oz (90.2 kg)     Lab Results  Component Value Date   WBC 4.8 07/15/2017   HGB 13.4 (A) 07/15/2017   HCT 39 (A) 07/15/2017   PLT 222 07/15/2017   GLUCOSE 108 (H) 03/02/2008   CHOL 168 07/15/2017   TRIG 79 07/15/2017   HDL 41 07/15/2017   LDLCALC 111 07/15/2017   ALT 22 07/15/2017   AST 18 07/15/2017   NA 142 07/15/2017   K 4.1 07/15/2017   CL 107 03/02/2008   CREATININE 1.2 07/15/2017   BUN 21 07/15/2017   CO2 28 03/02/2008   TSH 0.16 (A) 07/15/2017   PSA 0.3 07/15/2017   INR 1.0 06/09/2007   HGBA1C 5.4 08/23/2017    Mr Lumbar Spine Wo Contrast  Result Date:  08/23/2016 CLINICAL DATA:  Left-sided low back pain with sciatica. Persistent back pain with x-ray revealing minimal compression of L1 EXAM: MRI LUMBAR SPINE WITHOUT CONTRAST TECHNIQUE: Multiplanar, multisequence MR imaging of the lumbar spine was performed. No intravenous contrast was administered. COMPARISON:  Radiographs 08/10/2016 FINDINGS: Segmentation:  Normal Alignment:  Normal. Vertebrae: Negative for acute or chronic fracture. Negative for mass lesion. Normal bone marrow. No compression of L1 identified Conus medullaris: Extends to the T12-L1 level and appears normal. Paraspinal and other soft tissues: Negative Disc levels: L1-2:  Negative L2-3:  Negative L3-4: Mild disc degeneration and disc bulging. Small right extraforaminal disc protrusion with mild displacement of the L3 nerve root lateral to the foramen. Mild facet and ligamentum flavum hypertrophy. Mild narrowing of the spinal canal L4-5: Mild disc space narrowing and disc bulging. Small extraforaminal disc protrusion on the left with displacement of the left L4 nerve root. Moderate facet and ligamentum flavum hypertrophy bilaterally with mild spinal stenosis. L5-S1:  Mild facet degeneration without stenosis. IMPRESSION: Small right extraforaminal disc protrusion L3-4 Mild spinal stenosis L4-5. Small extraforaminal disc protrusion on the left. Electronically Signed   By: Franchot Gallo M.D.   On: 08/23/2016 08:18       Assessment & Plan:   Problem List Items Addressed This Visit    Health care maintenance    Physical today 12/06/17.  Just saw Dr Bernardo Heater.  PSA (07/12/17) .3. Colonoscopy 2010.  Recommended f/u 2020.        Relevant Orders   Measles/Mumps/Rubella Immunity   Hypercholesterolemia    On lipitor.  Low cholesterol diet and exercise.  Follow lipid panel and liver function tests.        Relevant Orders   Hepatic function panel   Lipid panel   Basic metabolic panel   Low back pain    Persistent pain.  MRI as outlined.  Saw  neurosurgery previously.  No surgery warranted at that time.  Refer to physical therapy.        Relevant Orders   Ambulatory referral to Physical Therapy   Obstructive sleep apnea    Uses cpap regularly.  Doing well with this.  Follow.        Relevant Orders   CBC with Differential/Platelet   THYROID CANCER, HX OF    Followed by Dr Forde Dandy.  Discussed with him today the need for continued f/u.        Relevant Orders   TSH   T4, free    Other Visit Diagnoses    Routine general medical examination at a health care facility    -  Primary   Hyperglycemia       Relevant Orders   Hemoglobin A1c       Einar Pheasant, MD

## 2017-12-09 ENCOUNTER — Encounter: Payer: Self-pay | Admitting: Internal Medicine

## 2017-12-09 NOTE — Assessment & Plan Note (Addendum)
Physical today 12/06/17.  Just saw Dr Bernardo Heater.  PSA (07/12/17) .3. Colonoscopy 2010.  Recommended f/u 2020.

## 2017-12-09 NOTE — Assessment & Plan Note (Signed)
Followed by Dr Forde Dandy.  Discussed with him today the need for continued f/u.

## 2017-12-09 NOTE — Assessment & Plan Note (Signed)
Uses cpap regularly.  Doing well with this.  Follow.

## 2017-12-09 NOTE — Assessment & Plan Note (Signed)
Persistent pain.  MRI as outlined.  Saw neurosurgery previously.  No surgery warranted at that time.  Refer to physical therapy.

## 2017-12-09 NOTE — Assessment & Plan Note (Signed)
On lipitor.  Low cholesterol diet and exercise.  Follow lipid panel and liver function tests.   

## 2017-12-16 ENCOUNTER — Ambulatory Visit: Payer: 59 | Attending: Internal Medicine

## 2017-12-16 DIAGNOSIS — G8929 Other chronic pain: Secondary | ICD-10-CM | POA: Insufficient documentation

## 2017-12-16 DIAGNOSIS — M5442 Lumbago with sciatica, left side: Secondary | ICD-10-CM | POA: Diagnosis not present

## 2017-12-16 NOTE — Patient Instructions (Signed)
Access Code: [email or web address]  URL: [email or web address]  Date: 12/16/2017  Prepared by: Roxana Hires   Exercises  Prone Press Up on Elbows - 1 reps - 5 minutes hold - 4x daily - 7x weekly  Single Knee to Chest Stretch - 3 reps - 30 seconds hold - 3x daily - 7x weekly  Supine Piriformis Stretch - 3 reps - 30 seconds hold - 3x daily - 7x weekly

## 2017-12-16 NOTE — Therapy (Signed)
[facility] [address] [place], [address] Phone: [phone]   Fax:  [phone]  Physical Therapy Evaluation  Patient Details  Name: Matthew Kelley MRN: [id number] Date of Birth: 04-01-1961 Referring Provider: Dr. Nicki Reaper   Encounter Date: 12/16/2017  PT End of Session - 12/16/17 0955    Visit Number  1    Number of Visits  17    Date for PT Re-Evaluation  02/10/18    Authorization Type  Progress note 1/10    Authorization Time Period  Last goals: 12/16/17    PT Start Time  0815    PT Stop Time  0915    PT Time Calculation (min)  60 min    Activity Tolerance  Patient tolerated treatment well    Behavior During Therapy  [facility] for tasks assessed/performed       Past Medical History:  Diagnosis Date  . Blood in stool    h/o  . Cancer [facility])    thyroid cancer  . History of chicken pox   . Hypercholesterolemia   . Sleep apnea   . Vertigo     Past Surgical History:  Procedure Laterality Date  . CHOLECYSTECTOMY  2000  . TOTAL THYROIDECTOMY  2009    There were no vitals filed for this visit.   Subjective Assessment - 12/16/17 1002    Subjective  Low back pain    Pertinent History   Pt referred by Dr. Nicki Reaper for low back pain. Pt reports L low back pain for "multiple years" but it has been worsening over the last year. He denies any history of prior back pain or trauma. Pain and numbness radiating from low back down lateral LLE to the level of the toes. Pain is 4/10 at present. Worst pain is 7/10 and best pain is 0/10. Pain is worse in the AM and improves with standing. Pain wakes him up from sleep but infrequently. Pain worsens with sitting. He also complains of what he believes to be unrelated thoracic pain around mid back at one specific level. He saw neurosurgery previously and MRI showed small right extraforaminal disc protrusion L3-4 and mild spinal stenosis L4-5.  Small extraforaminal disc protrusion on  the left. Pt was determined not to be a surgical candidate at this time and he prefers conservative measures. He has not tried an oral steroid taper or steroid injections. He describes pain as a deep ache which is sharp with movement. Neurosurgeon game him exercises. Pt has performed cat/camels (aggravated pain). Attempted runners stretch (no improvement). Has tried "supermans" and bridges with no change in symptoms. No prolonged prone positioning or repeated extension attempted. Pt denies saddle paresthesia or bowel/bladder changes. Prior history of thyroid cancer 5 years ago which was treated with surgery and radioactive ablation. No recurrence of cancer since that time. Pt denies chills, fevers, nausea, vomiting, or unexplained weight gain/loss.      How long can you sit comfortably?  No limit but will have increase in pain hours later or possibley the next day    How long can you stand comfortably?  no limit    How long can you walk comfortably?  no limit    Diagnostic tests  MRI previously which revealed small right extraforaminal disc protrusion L3-4 and mild spinal stenosis L4-5.  Small extraforaminal disc protrusion on the left.    Patient Stated Goals  Decrease pain at home and work    Currently in Pain?  Yes    Pain Score  4    Best: 0/10, worst: 7/10   Pain Location  Back    Pain Orientation  Left    Pain Descriptors / Indicators  Aching;Sharp    Pain Type  Chronic pain    Pain Onset  More than a month ago    Pain Frequency  Intermittent    Aggravating Factors   sitting or extended time    Pain Relieving Factors  exercise, Advil, no help with ice, has improved gradually over the last year        SUBJECTIVE   Chief complaint:  Pt referred by Dr. Nicki Reaper for low back pain. Pt reports L low back pain for "multiple years" but it has been worsening over the last year. He denies any history of prior back pain or trauma. Pain and numbness radiating from low back down lateral LLE to the level  of the toes. Pain is 4/10 at present. Worst pain is 7/10 and best pain is 0/10. Pain is worse in the AM and improves with standing. Pain wakes him up from sleep but infrequently. Pain worsens with sitting. He also complains of what he believes to be unrelated thoracic pain around mid back at one specific level. He saw neurosurgery previously and MRI showed small right extraforaminal disc protrusion L3-4 and mild spinal stenosis L4-5.  Small extraforaminal disc protrusion on the left. Pt was determined not to be a surgical candidate at this time and he prefers conservative measures. He has not tried an oral steroid taper or steroid injections. He describes pain as a deep ache which is sharp with movement. Neurosurgeon game him exercises. Pt has performed cat/camels (aggravated pain). Attempted runners stretch (no improvement). Has tried "supermans" and bridges with no change in symptoms. No prolonged prone positioning or repeated extension attempted. Pt denies saddle paresthesia or bowel/bladder changes. Prior history of thyroid cancer 5 years ago which was treated with surgery and radioactive ablation. No recurrence of cancer since that time. Pt denies chills, fevers, nausea, vomiting, or unexplained weight gain/loss.    Onset: Years but worsened over the last year Referring Dx: Low back pain MD: Dr. Einar Pheasant Pain: /10 Present, 0/10 Best, 7/10 Worst: Aggravating factors: sitting or extended time Easing factors: exercise, Advil, no help with ice, has improved gradually over the last year 24 hour pain behavior: Worse in the AM How long can you sit: No limit but hurts worse hours later or the following day How long can you stand: No limit How long can you walk: No limit Recent back trauma: No Prior history of back injury or pain: No Pain quality: pain quality: aching and sharp with movement Waking pain: Yes infrequent Radiating pain: Yes Down LLE to the level of the towe Numbness/Tingling: Yes to  the toes Follow-up appointment with MD: No Dominant hand: left Imaging: Yes  MRI previously which revealed small right extraforaminal disc protrusion L3-4 and mild spinal stenosis L4-5.  Small extraforaminal disc protrusion on the left.    OBJECTIVE  Mental Status Patient is oriented to person, place and time.  Recent memory is intact.  Remote memory is intact.  Attention span and concentration are intact.  Expressive speech is intact.  Patient's fund of knowledge is within normal limits for educational level.  SENSATION: Grossly intact to light touch bilateral L as determined by testing dermatomes L2-S2 Proprioception and hot/cold testing deferred on this date   MUSCULOSKELETAL: Tremor: None Bulk: Normal Tone: Normal  Posture  No gross abnormalities noted  Gait Deferred   Palpation Pain with palpation along L lumbar paraspinals but particularly into L QL. No pain with palpation over deep external rotators of L hip but pt reports some radiating pain with palpation of L gluteus maximus/medius.   Strength (out of 5) R/L 5/4+ Hip flexion 4/4+ Hip abduction 4/4 Hip adduction 4/4 Hip extension 5/5 Knee extension 5/5 Knee flexion 5/5 Ankle dorsiflexion *Indicates pain   AROM (degrees) R/L (all movements include overpressure unless otherwise stated) Lumbar forward flexion (0-65): WFL but decreased segmental flexion noted Lumbar extension (0-30): WFL but decreased segmental extension noted Lumbar lateral flexion (0-25): R: WFL  L: Decreased range to the left and subjective stiffness Lumbar rotation: WFL, mild "pulling in L lower back with R rotation" Hip IR (0-45): R: 25 L: 25 Hip ER (0-45): R: 45 L: 45 Hip extension (0-15): R: 15 L: 15 *Indicates pain  Repeated Movements No centralization or peripheralization of symptoms with repeated lumbar extension or flexion.    Muscle Length Hamstrings: R: 75 degrees L: 80 degrees  Ely: R: WNL L: WNL Ober: R: Positive L:  Positive   Passive Accessory Intervertebral Motion (PAIVM) Pt denies reproduction of low back and posterior L hip pain with CPA T7-L5 and UPA bilaterally T7-L5. Generally hypomobile throughout. He does report pain which is close to his pain with CPA and UPA at L3 but does not radiate. He also reports localized pain with CPA and L UPA at T7   Passive Physiological Intervertebral Motion (PPIVM) Normal flexion and extension with PPIVM testing   SPECIAL TESTS Lumbar quadrant: R: Negative L: Negative Slump: R: Negative L: Positive SLR: R: Negative L:  Negative Crossed SLR: R: Negative L: Negative FABER: R: Negative L: Negative FADIR: R: Negative L: Negative  Hip scour: R: Negative L: Negative Ely: R: Negative L: Negative Thomas: R: Not examined L: Not examined Ober: R: Positive L: Positive Forward Step-Down Test: R: Not examined L: Not examined Lateral Step-Down Test: R: Not examined L: Not examined Deep Squat: Not examined     [facility] PT Assessment - 12/16/17 0952      Assessment   Medical Diagnosis  Low back pain    Referring Provider  Dr. Nicki Reaper    Onset Date/Surgical Date  12/17/15   Approximate   Hand Dominance  Left    Next MD Visit  None scheduled    Prior Therapy  None      Precautions   Precautions  None      Restrictions   Weight Bearing Restrictions  No      Balance Screen   Has the patient fallen in the past 6 months  No    Has the patient had a decrease in activity level because of a fear of falling?   No    Is the patient reluctant to leave their home because of a fear of falling?   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     TREATMENT  Ther-ex  Supine L single knee to chest stretch with R hip in neutral extension 30s x 3; Supine L piriformis stretch with R hip in neutral extension 30s x 3; Prone positioning x 1 minute for form correction; Written HEP provided to patient with education about how to complete correctly.          PT Education - 12/16/17 0954    Education Details  plan of care, HEP    Person(s) Educated  Patient    Methods  Explanation;Demonstration;Handout    Comprehension  Verbalized understanding;Returned demonstration       PT Short Term Goals - 12/16/17 1012      PT SHORT TERM GOAL #1   Title  Pt will be independent with HEP in order to improve strength and decrease back pain in order to improve pain-free function at home and work.     Time  4    Period  Weeks    Status  New    Target Date  01/13/18        PT Long Term Goals - 12/16/17 1013      PT LONG TERM GOAL #1   Title  Pt will decrease mODI scoreby at least 13 points in order demonstrate clinically significant reduction in back pain/disability.       Baseline  12/16/17: 36%    Time  8    Period  Weeks    Status  New    Target Date  02/10/18      PT LONG TERM GOAL #2   Title  Pt will decrease worst back pain as reported on NPRS by at least 2 points in order to demonstrate clinically significant reduction in back pain.     Baseline  12/16/17: 7/10    Time  8    Period  Weeks    Status  New    Target Date  02/10/18      PT LONG TERM GOAL #3   Title  Pt will increase hip abduction and extension strength of by at least 1/2 MMT grade in order to demonstrate improvement in strength and function.    Baseline  12/16/17: R/L abduction 4/4+, extension: 4/4    Time  8    Period  Weeks    Status  New    Target Date   02/10/18             Plan - 12/16/17 1011    Clinical Impression Statement  Pt is a pleasant 57 year-old male referred for chronic low back pain which has been worsening over the last year. Pt has radicular pain and numbness into LLE extending to the level of the L ankle.  PT examination reveals deficits in bilateral hip internal rotation range of motion, L hip flexion strength, and limited segment lumbar flexion/extension. Pt reports pain with CPA and L UPA at L3/4. Positive reproduction of pain with deep palpation to left lumbar paraspinals around L3 as well as L quadratus lumborum. Pt reports radiating pain to LLE with deep palpation to superior fibers of L glut max/med. Pt presents with deficits in strength, mobility, range of motion, and pain. He will benefit from skilled PT services to address deficits and return to pain-free function at home and work.     History and Personal Factors relevant  to plan of care:  Low (stable): no personal factors/comorbidities, 1-2 body systems/activity limitations/participation restrictions      Clinical Presentation  Evolving    Clinical Presentation due to:  Improving    Clinical Decision Making  Low    PT Frequency  2x / week    PT Duration  8 weeks    PT Treatment/Interventions  ADLs/Self Care Home Management;Aquatic Therapy;Canalith Repostioning;Cryotherapy;Electrical Stimulation;Iontophoresis 4mg /ml Dexamethasone;Moist Heat;Traction;Ultrasound;DME Instruction;Gait training;Functional mobility training;Therapeutic exercise;Therapeutic activities;Balance training;Neuromuscular re-education;Patient/family education;Manual techniques;Passive range of motion;Dry needling    PT Next Visit Plan  Assess response to extension based prone positioning, STM to L paraspinals and QL, Low grade CPA and UPA L2-L4, progress neutral spine strengthening    PT Home Exercise Plan  L single knee to chest stretch, L piriformis stretch, prone on elbows static positioning     Consulted and Agree with Plan of Care  Patient       Patient will benefit from skilled therapeutic intervention in order to improve the following deficits and impairments:  Decreased strength, Pain, Decreased range of motion  Visit Diagnosis: Chronic left-sided low back pain with left-sided sciatica     Problem List Patient Active Problem List   Diagnosis Date Noted  . Low back pain 08/19/2016  . Health care maintenance 07/30/2014  . Hypercholesterolemia 08/01/2013  . Hemangioma 08/01/2013  . Right hip pain 08/01/2013  . Low testosterone 08/01/2013  . Detached retina 08/01/2013  . Obstructive sleep apnea 06/10/2009  . THYROID CANCER, HX OF 06/10/2009   Lyndel Safe Huprich PT, DPT, GCS  Huprich,Jason 12/16/2017, 10:21 AM  [facility] [address] [place], [address] Phone: [phone]   Fax:  [phone]  Name: Matthew Kelley MRN: [id number] Date of Birth: 1961-03-24

## 2018-01-10 ENCOUNTER — Encounter: Payer: 59 | Admitting: Physical Therapy

## 2018-01-17 ENCOUNTER — Ambulatory Visit: Payer: 59 | Attending: Internal Medicine | Admitting: Physical Therapy

## 2018-01-24 ENCOUNTER — Telehealth: Payer: Self-pay | Admitting: Physical Therapy

## 2018-01-24 ENCOUNTER — Ambulatory Visit: Payer: 59 | Admitting: Physical Therapy

## 2018-01-24 DIAGNOSIS — G4733 Obstructive sleep apnea (adult) (pediatric): Secondary | ICD-10-CM | POA: Diagnosis not present

## 2018-01-31 ENCOUNTER — Ambulatory Visit: Payer: 59 | Admitting: Physical Therapy

## 2018-02-06 IMAGING — MR MR LUMBAR SPINE W/O CM
4 of 5 series · 19 of 48 positions shown · non-contrast
Comparison: Radiographs 08/10/2016

CLINICAL DATA: Left-sided low back pain with sciatica. Persistent
back pain with x-ray revealing minimal compression of L1

EXAM:
MRI LUMBAR SPINE WITHOUT CONTRAST
TECHNIQUE: Multiplanar, multisequence MR imaging of the lumbar spine was
performed. No intravenous contrast was administered.

[Series 3: T1 · sagittal · 4.0mm · 0.51mm/px · 3 of 14 slices shown (1 of 2)]
[im 1/14]
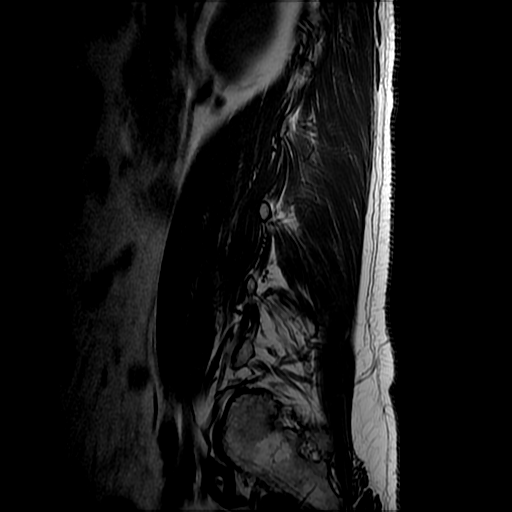
[im 7/14]
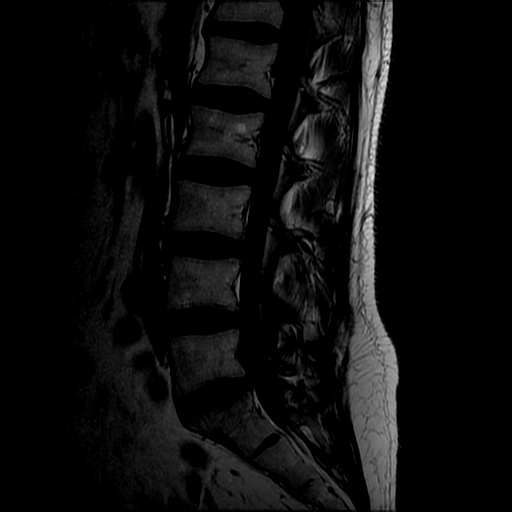
[im 14/14]
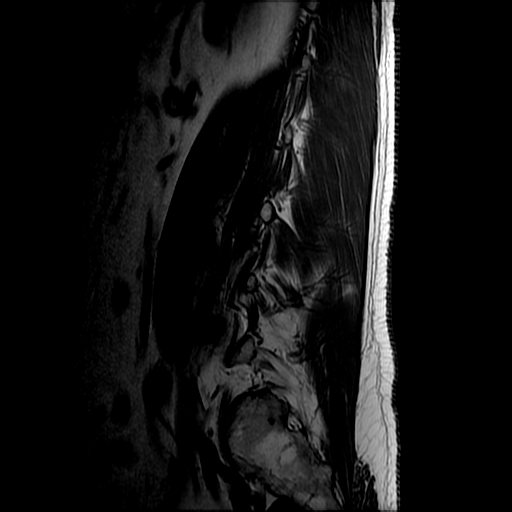

[Series 4: T2 post-contrast · sagittal · 4.0mm · 0.51mm/px · 5 of 14 slices shown]
[im 1/14]
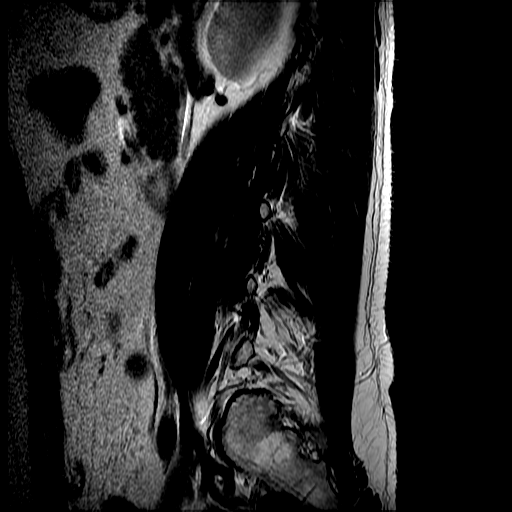
[im 4/14]
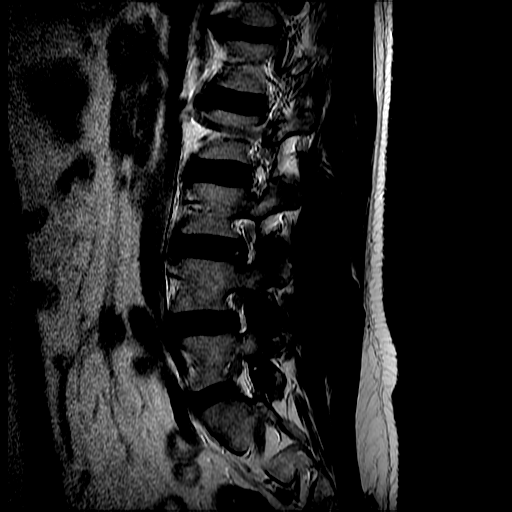
[im 7/14]
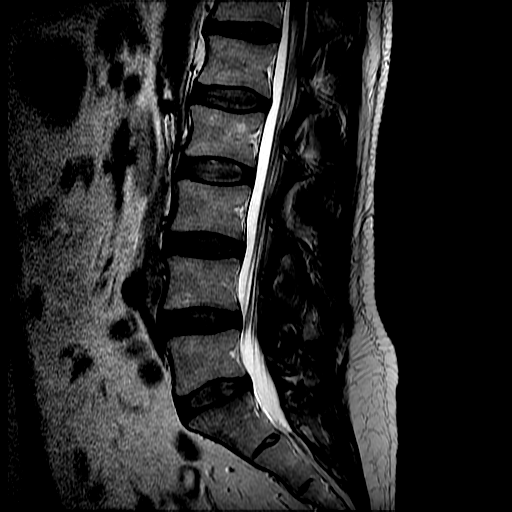
[im 10/14]
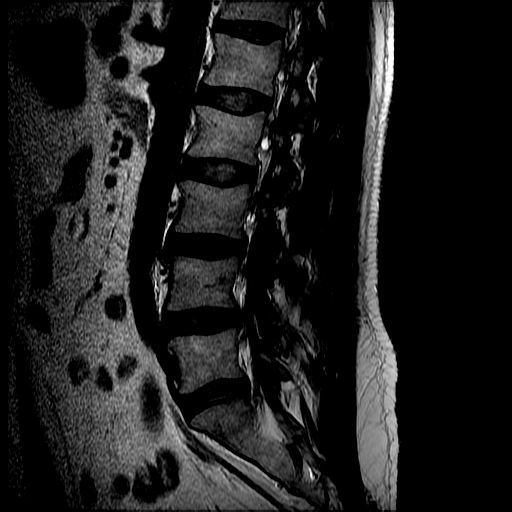
[im 14/14]
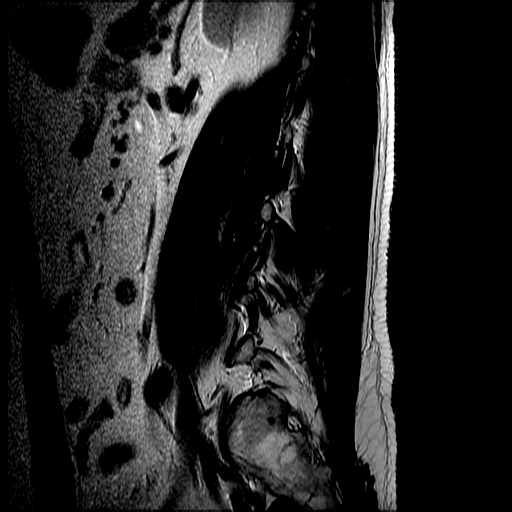

[Series 6: T2 · axial · 4.0mm · 0.41mm/px · z∈[-111,+63]mm · 8 of 39 slices shown]
[im 3/39]
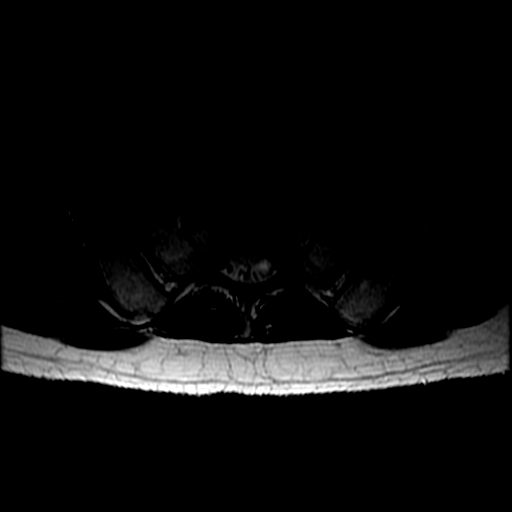
[im 6/39]
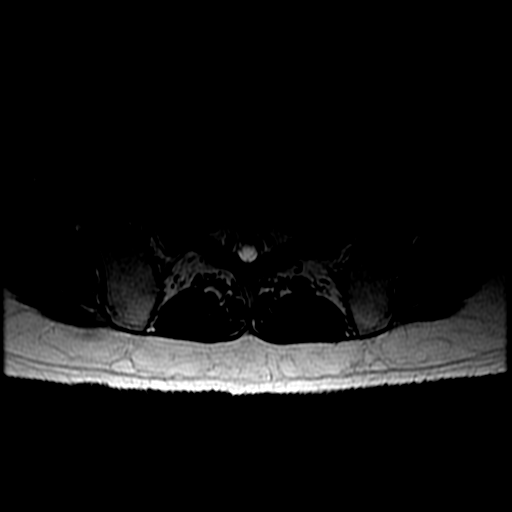
[im 8/39]
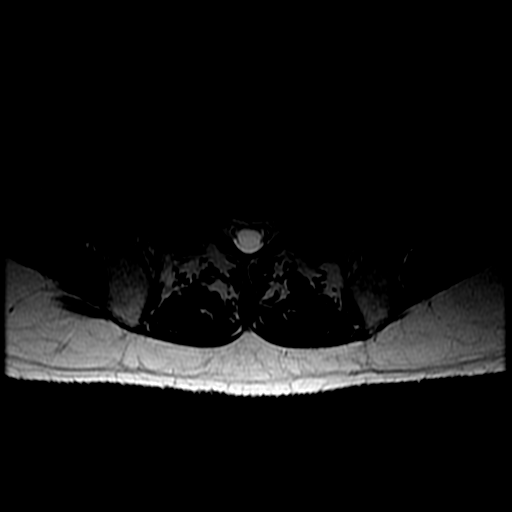
[im 13/39]
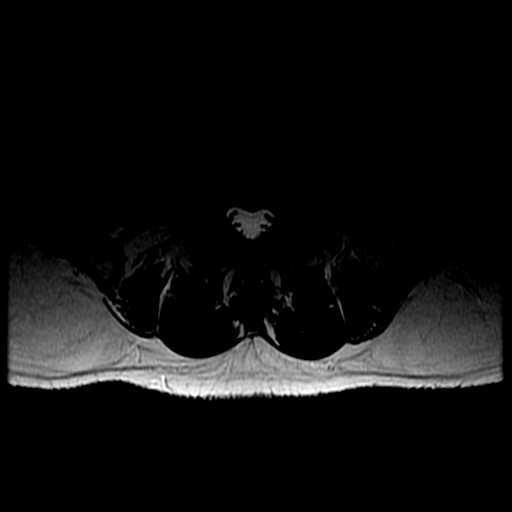
[im 18/39]
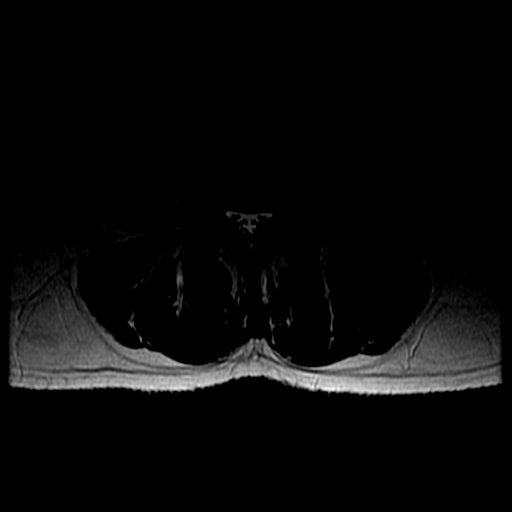
[im 21/39]
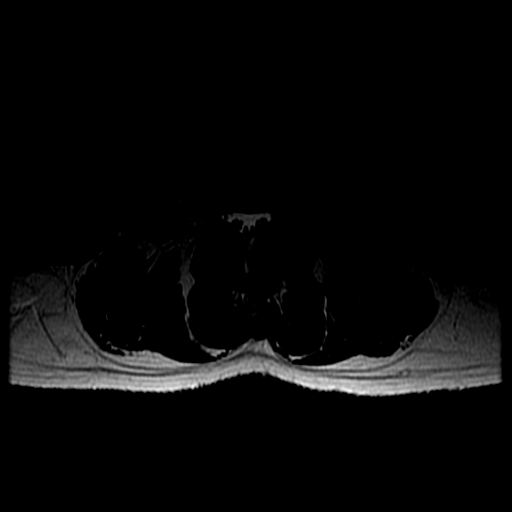
[im 23/39]
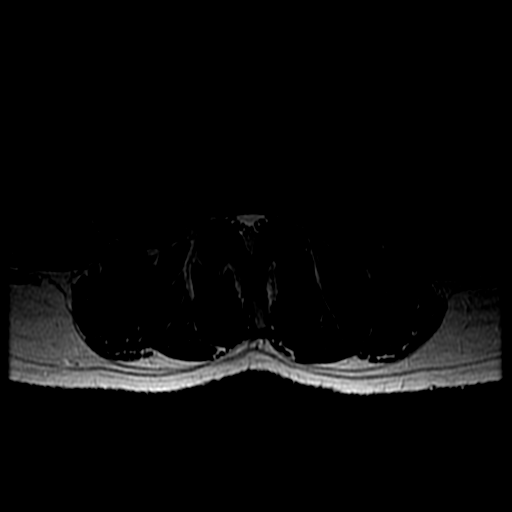
[im 33/39]
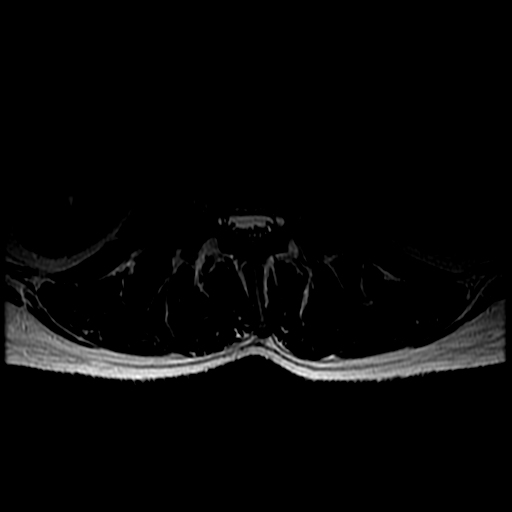

[Series 7: T1 · axial · 4.0mm · 0.41mm/px · z∈[-96,+63]mm · 3 of 39 slices shown (2 of 2)]
[im 6/39]
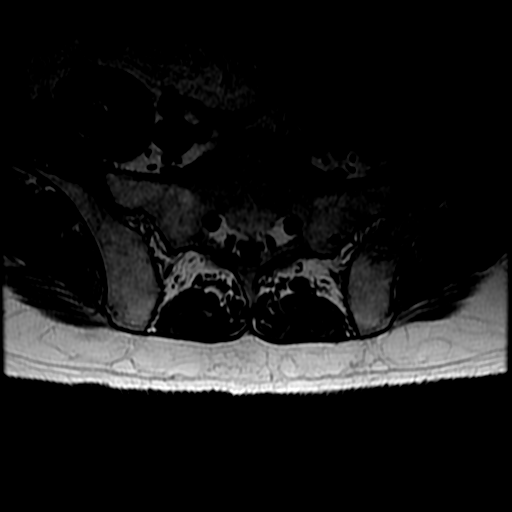
[im 21/39]
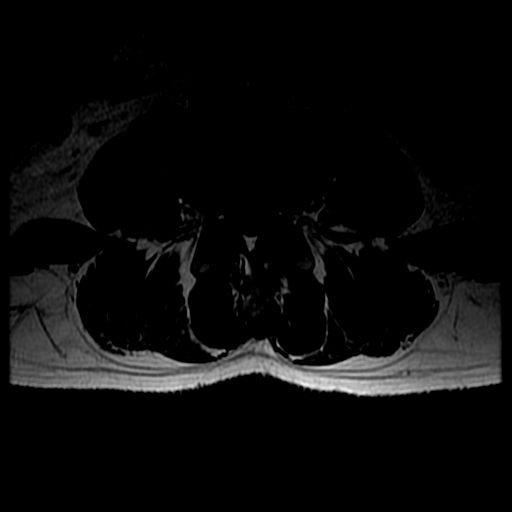
[im 33/39]
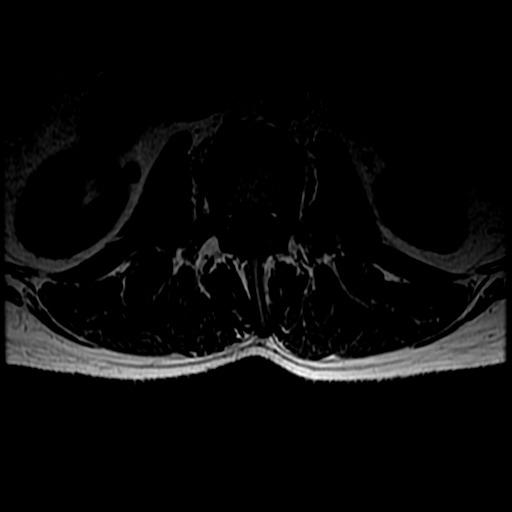

[19 of 48 positions shown; findings below may reference images not displayed]

FINDINGS: Segmentation:  Normal

Alignment:  Normal.

Vertebrae: Negative for acute or chronic fracture. Negative for mass
lesion. Normal bone marrow. No compression of L1 identified

Conus medullaris: Extends to the T12-L1 level and appears normal.

Paraspinal and other soft tissues: Negative

Disc levels:

L1-2:  Negative

L2-3:  Negative

L3-4: Mild disc degeneration and disc bulging. Small right
extraforaminal disc protrusion with mild displacement of the L3
nerve root lateral to the foramen. Mild facet and ligamentum flavum
hypertrophy. Mild narrowing of the spinal canal

L4-5: Mild disc space narrowing and disc bulging. Small
extraforaminal disc protrusion on the left with displacement of the
left L4 nerve root. Moderate facet and ligamentum flavum hypertrophy
bilaterally with mild spinal stenosis.

L5-S1:  Mild facet degeneration without stenosis.
IMPRESSION: Small right extraforaminal disc protrusion L3-4

Mild spinal stenosis L4-5. Small extraforaminal disc protrusion on
the left.

## 2018-02-07 ENCOUNTER — Other Ambulatory Visit: Payer: Self-pay | Admitting: Internal Medicine

## 2018-02-07 ENCOUNTER — Encounter: Payer: 59 | Admitting: Physical Therapy

## 2018-02-14 ENCOUNTER — Encounter: Payer: 59 | Admitting: Physical Therapy

## 2018-02-28 DIAGNOSIS — G4733 Obstructive sleep apnea (adult) (pediatric): Secondary | ICD-10-CM | POA: Diagnosis not present

## 2018-02-28 DIAGNOSIS — Z7689 Persons encountering health services in other specified circumstances: Secondary | ICD-10-CM | POA: Diagnosis not present

## 2018-02-28 DIAGNOSIS — R739 Hyperglycemia, unspecified: Secondary | ICD-10-CM | POA: Diagnosis not present

## 2018-02-28 DIAGNOSIS — Z0000 Encounter for general adult medical examination without abnormal findings: Secondary | ICD-10-CM | POA: Diagnosis not present

## 2018-02-28 DIAGNOSIS — Z85850 Personal history of malignant neoplasm of thyroid: Secondary | ICD-10-CM | POA: Diagnosis not present

## 2018-02-28 DIAGNOSIS — E7800 Pure hypercholesterolemia, unspecified: Secondary | ICD-10-CM | POA: Diagnosis not present

## 2018-03-01 LAB — CBC WITH DIFFERENTIAL/PLATELET
Basophils Absolute: 0.1 10*3/uL (ref 0.0–0.2)
Basos: 1 %
EOS (ABSOLUTE): 0.3 10*3/uL (ref 0.0–0.4)
Eos: 7 %
Hematocrit: 34.9 % — ABNORMAL LOW (ref 37.5–51.0)
Hemoglobin: 11.3 g/dL — ABNORMAL LOW (ref 13.0–17.7)
Immature Grans (Abs): 0 10*3/uL (ref 0.0–0.1)
Immature Granulocytes: 0 %
Lymphocytes Absolute: 1.3 10*3/uL (ref 0.7–3.1)
Lymphs: 33 %
MCH: 25.9 pg — ABNORMAL LOW (ref 26.6–33.0)
MCHC: 32.4 g/dL (ref 31.5–35.7)
MCV: 80 fL (ref 79–97)
Monocytes Absolute: 0.4 10*3/uL (ref 0.1–0.9)
Monocytes: 11 %
Neutrophils Absolute: 1.9 10*3/uL (ref 1.4–7.0)
Neutrophils: 48 %
Platelets: 227 10*3/uL (ref 150–450)
RBC: 4.36 x10E6/uL (ref 4.14–5.80)
RDW: 13.5 % (ref 12.3–15.4)
WBC: 3.9 10*3/uL (ref 3.4–10.8)

## 2018-03-01 LAB — TSH: TSH: 0.05 u[IU]/mL — ABNORMAL LOW (ref 0.450–4.500)

## 2018-03-01 LAB — BASIC METABOLIC PANEL
BUN/Creatinine Ratio: 20 (ref 9–20)
BUN: 21 mg/dL (ref 6–24)
CO2: 22 mmol/L (ref 20–29)
Calcium: 9 mg/dL (ref 8.7–10.2)
Chloride: 105 mmol/L (ref 96–106)
Creatinine, Ser: 1.05 mg/dL (ref 0.76–1.27)
GFR calc Af Amer: 89 mL/min/{1.73_m2} (ref >59)
GFR calc non Af Amer: 77 mL/min/{1.73_m2} (ref >59)
Glucose: 105 mg/dL — ABNORMAL HIGH (ref 65–99)
Potassium: 4.3 mmol/L (ref 3.5–5.2)
Sodium: 141 mmol/L (ref 134–144)

## 2018-03-01 LAB — HEPATIC FUNCTION PANEL
ALT: 16 IU/L (ref 0–44)
AST: 17 IU/L (ref 0–40)
Albumin: 4.4 g/dL (ref 3.5–5.5)
Alkaline Phosphatase: 93 IU/L (ref 39–117)
Bilirubin Total: 0.2 mg/dL (ref 0.0–1.2)
Bilirubin, Direct: 0.07 mg/dL (ref 0.00–0.40)
Total Protein: 6.6 g/dL (ref 6.0–8.5)

## 2018-03-01 LAB — LIPID PANEL
Chol/HDL Ratio: 3.4 ratio (ref 0.0–5.0)
Cholesterol, Total: 154 mg/dL (ref 100–199)
HDL: 45 mg/dL (ref >39)
LDL Calculated: 96 mg/dL (ref 0–99)
Triglycerides: 63 mg/dL (ref 0–149)
VLDL Cholesterol Cal: 13 mg/dL (ref 5–40)

## 2018-03-01 LAB — HEMOGLOBIN A1C
Est. average glucose Bld gHb Est-mCnc: 114 mg/dL
Hgb A1c MFr Bld: 5.6 % (ref 4.8–5.6)

## 2018-03-01 LAB — T4, FREE: Free T4: 1.98 ng/dL — ABNORMAL HIGH (ref 0.82–1.77)

## 2018-03-01 LAB — MEASLES/MUMPS/RUBELLA IMMUNITY
MUMPS ABS, IGG: 100 AU/mL (ref >10.9)
RUBEOLA AB, IGG: >300 AU/mL (ref >16.4)
Rubella Antibodies, IGG: 7.77 index (ref >0.99)

## 2018-03-03 ENCOUNTER — Telehealth: Payer: Self-pay

## 2018-03-03 ENCOUNTER — Other Ambulatory Visit: Payer: Self-pay

## 2018-03-03 ENCOUNTER — Telehealth: Payer: Self-pay | Admitting: Internal Medicine

## 2018-03-03 NOTE — Telephone Encounter (Signed)
Pt called in but triage unable to disclose triage note.

## 2018-03-03 NOTE — Telephone Encounter (Signed)
Copied from [place] [phone]. Topic: Quick Communication - Lab Results (Clinic Use ONLY) >> Mar 03, 2018  9:33 AM Nanci Pina, LPN wrote: Called patient to inform them of 02/2218 lab results. When patient returns call, triage nurse may not disclose results. >> Mar 03, 2018  9:44 AM Leward Quan A wrote: Patient called back regarding his results. Say please call back on Ph# [phone] also say if possible leave a detailed message.

## 2018-03-03 NOTE — Telephone Encounter (Signed)
Pt states ok to leave a detailed message if you need to concerning his labs

## 2018-03-04 ENCOUNTER — Telehealth: Payer: Self-pay | Admitting: Internal Medicine

## 2018-03-04 DIAGNOSIS — D649 Anemia, unspecified: Secondary | ICD-10-CM

## 2018-03-04 NOTE — Telephone Encounter (Signed)
SEE result note.

## 2018-03-04 NOTE — Telephone Encounter (Signed)
Orders placed for [facility] labs.   

## 2018-03-05 DIAGNOSIS — D649 Anemia, unspecified: Secondary | ICD-10-CM | POA: Diagnosis not present

## 2018-03-06 LAB — CBC WITH DIFFERENTIAL/PLATELET
Basophils Absolute: 0.1 10*3/uL (ref 0.0–0.2)
Basos: 1 %
EOS (ABSOLUTE): 0.3 10*3/uL (ref 0.0–0.4)
Eos: 6 %
Hematocrit: 35.3 % — ABNORMAL LOW (ref 37.5–51.0)
Hemoglobin: 11.3 g/dL — ABNORMAL LOW (ref 13.0–17.7)
Immature Grans (Abs): 0 10*3/uL (ref 0.0–0.1)
Immature Granulocytes: 0 %
Lymphocytes Absolute: 1.5 10*3/uL (ref 0.7–3.1)
Lymphs: 36 %
MCH: 24.9 pg — ABNORMAL LOW (ref 26.6–33.0)
MCHC: 32 g/dL (ref 31.5–35.7)
MCV: 78 fL — ABNORMAL LOW (ref 79–97)
Monocytes Absolute: 0.5 10*3/uL (ref 0.1–0.9)
Monocytes: 12 %
Neutrophils Absolute: 1.8 10*3/uL (ref 1.4–7.0)
Neutrophils: 45 %
Platelets: 230 10*3/uL (ref 150–450)
RBC: 4.54 x10E6/uL (ref 4.14–5.80)
RDW: 13.6 % (ref 12.3–15.4)
WBC: 4.1 10*3/uL (ref 3.4–10.8)

## 2018-03-06 LAB — VITAMIN B12: Vitamin B-12: 638 pg/mL (ref 232–1245)

## 2018-03-06 LAB — FERRITIN: Ferritin: 9 ng/mL — ABNORMAL LOW (ref 30–400)

## 2018-03-06 LAB — IRON AND TIBC
Iron Saturation: 8 % — CL (ref 15–55)
Iron: 29 ug/dL — ABNORMAL LOW (ref 38–169)
Total Iron Binding Capacity: 356 ug/dL (ref 250–450)
UIBC: 327 ug/dL (ref 111–343)

## 2018-03-10 ENCOUNTER — Other Ambulatory Visit: Payer: Self-pay | Admitting: Internal Medicine

## 2018-03-10 ENCOUNTER — Telehealth: Payer: Self-pay | Admitting: Internal Medicine

## 2018-03-10 DIAGNOSIS — D509 Iron deficiency anemia, unspecified: Secondary | ICD-10-CM

## 2018-03-10 NOTE — Telephone Encounter (Signed)
Results given and documented in result note. 

## 2018-03-10 NOTE — Telephone Encounter (Signed)
Copied from [place] [phone]. Topic: Quick Communication - Lab Results (Clinic Use ONLY) >> Mar 10, 2018  8:30 AM Nanci Pina, LPN wrote: Called patient to inform them of 03/10/18 lab results. When patient returns call, triage nurse may disclose results. >> Mar 10, 2018  9:08 AM Bea Graff, NT wrote: Pt returning call to receive lab results.

## 2018-03-10 NOTE — Telephone Encounter (Signed)
Orders placed for [facility] labs.   

## 2018-03-10 NOTE — Progress Notes (Signed)
Order placed for GI referral.   

## 2018-03-14 ENCOUNTER — Telehealth: Payer: Self-pay | Admitting: *Deleted

## 2018-03-14 NOTE — Telephone Encounter (Signed)
Referral just placed on 03/10/18?

## 2018-03-14 NOTE — Telephone Encounter (Signed)
Noted will forward to [facility] to schedule appt.  Let me know if any problems.

## 2018-03-14 NOTE — Telephone Encounter (Signed)
Copied from [place] [phone]. Topic: General - Other >> Mar 14, 2018  9:08 AM Leward Quan A wrote: Reason for CRM: Patient called to check status of GI consult referral stated that it has been a week and he has not heard from anyone yet.   Please advise Ph# [phone]

## 2018-04-18 DIAGNOSIS — D509 Iron deficiency anemia, unspecified: Secondary | ICD-10-CM | POA: Diagnosis not present

## 2018-04-25 DIAGNOSIS — G4733 Obstructive sleep apnea (adult) (pediatric): Secondary | ICD-10-CM | POA: Diagnosis not present

## 2018-05-29 ENCOUNTER — Encounter: Payer: Self-pay | Admitting: Internal Medicine

## 2018-05-29 DIAGNOSIS — K640 First degree hemorrhoids: Secondary | ICD-10-CM | POA: Diagnosis not present

## 2018-05-29 DIAGNOSIS — K635 Polyp of colon: Secondary | ICD-10-CM | POA: Diagnosis not present

## 2018-05-29 DIAGNOSIS — K3189 Other diseases of stomach and duodenum: Secondary | ICD-10-CM | POA: Diagnosis not present

## 2018-05-29 DIAGNOSIS — K2900 Acute gastritis without bleeding: Secondary | ICD-10-CM | POA: Diagnosis not present

## 2018-05-29 DIAGNOSIS — K2970 Gastritis, unspecified, without bleeding: Secondary | ICD-10-CM | POA: Diagnosis not present

## 2018-05-29 DIAGNOSIS — D509 Iron deficiency anemia, unspecified: Secondary | ICD-10-CM | POA: Diagnosis not present

## 2018-05-29 DIAGNOSIS — K2950 Unspecified chronic gastritis without bleeding: Secondary | ICD-10-CM | POA: Diagnosis not present

## 2018-05-29 HISTORY — PX: COLONOSCOPY WITH ESOPHAGOGASTRODUODENOSCOPY (EGD): SHX5779

## 2018-07-08 ENCOUNTER — Other Ambulatory Visit: Payer: Self-pay | Admitting: Internal Medicine

## 2018-07-08 MED FILL — FINASTERIDE 1 MG TABLET: 1 | 90 days supply | Qty: 90 | Fill #0

## 2018-07-08 MED FILL — OMEPRAZOLE 40 MG CPDR: 40 | 30 days supply | Qty: 60 | Fill #0

## 2018-07-09 MED FILL — RESTASIS 0.05% EYE EMULSION: 0.05 | 30 days supply | Qty: 60 | Fill #0

## 2018-07-29 DIAGNOSIS — G4733 Obstructive sleep apnea (adult) (pediatric): Secondary | ICD-10-CM | POA: Diagnosis not present

## 2018-08-05 MED FILL — DOXYCYCLINE HYC 100 MG CAPS: 100 | 30 days supply | Qty: 60 | Fill #0

## 2018-08-08 DIAGNOSIS — E230 Hypopituitarism: Secondary | ICD-10-CM | POA: Diagnosis not present

## 2018-08-08 DIAGNOSIS — Z1331 Encounter for screening for depression: Secondary | ICD-10-CM | POA: Diagnosis not present

## 2018-08-08 DIAGNOSIS — G4730 Sleep apnea, unspecified: Secondary | ICD-10-CM | POA: Diagnosis not present

## 2018-08-08 DIAGNOSIS — E785 Hyperlipidemia, unspecified: Secondary | ICD-10-CM | POA: Diagnosis not present

## 2018-08-08 DIAGNOSIS — C73 Malignant neoplasm of thyroid gland: Secondary | ICD-10-CM | POA: Diagnosis not present

## 2018-08-08 DIAGNOSIS — E890 Postprocedural hypothyroidism: Secondary | ICD-10-CM | POA: Diagnosis not present

## 2018-08-08 DIAGNOSIS — K922 Gastrointestinal hemorrhage, unspecified: Secondary | ICD-10-CM | POA: Diagnosis not present

## 2018-08-08 DIAGNOSIS — E559 Vitamin D deficiency, unspecified: Secondary | ICD-10-CM | POA: Diagnosis not present

## 2018-09-05 ENCOUNTER — Telehealth: Payer: Self-pay | Admitting: *Deleted

## 2018-09-05 DIAGNOSIS — M549 Dorsalgia, unspecified: Secondary | ICD-10-CM

## 2018-09-05 NOTE — Telephone Encounter (Signed)
Pt is requesting a referral to physical therapy for his back pain. Has been referred in the past but would like to start seeing the physical therapist listed below. Confirmed no new acute issues.

## 2018-09-05 NOTE — Telephone Encounter (Signed)
LMTCB to get more information. Need to know why he is requesting referral.

## 2018-09-05 NOTE — Telephone Encounter (Signed)
Copied from [place] [phone]. Topic: Referral - Request for Referral >> Sep 05, 2018  2:01 PM Rainey Pines A wrote: Referral for which specialty:PT Preferred provider/office:Jeff Shupe at [facility] [phone] Reason for referral:  Physcial therapy

## 2018-09-05 NOTE — Telephone Encounter (Signed)
Order placed for referral to physical therapy.  See phone note for details.

## 2018-09-16 MED FILL — RESTASIS 0.05% EYE EMULSION: 0.05 | 30 days supply | Qty: 60 | Fill #1

## 2018-09-16 MED FILL — OMEPRAZOLE 40 MG CPDR: 40 | 90 days supply | Qty: 180 | Fill #1

## 2018-09-19 DIAGNOSIS — N62 Hypertrophy of breast: Secondary | ICD-10-CM | POA: Diagnosis not present

## 2018-09-19 DIAGNOSIS — M545 Low back pain: Secondary | ICD-10-CM | POA: Diagnosis not present

## 2018-09-19 DIAGNOSIS — D509 Iron deficiency anemia, unspecified: Secondary | ICD-10-CM | POA: Diagnosis not present

## 2018-09-19 DIAGNOSIS — M5416 Radiculopathy, lumbar region: Secondary | ICD-10-CM | POA: Diagnosis not present

## 2018-09-19 DIAGNOSIS — E230 Hypopituitarism: Secondary | ICD-10-CM | POA: Diagnosis not present

## 2018-09-19 DIAGNOSIS — E559 Vitamin D deficiency, unspecified: Secondary | ICD-10-CM | POA: Diagnosis not present

## 2018-09-19 DIAGNOSIS — E890 Postprocedural hypothyroidism: Secondary | ICD-10-CM | POA: Diagnosis not present

## 2018-09-19 DIAGNOSIS — E785 Hyperlipidemia, unspecified: Secondary | ICD-10-CM | POA: Diagnosis not present

## 2018-09-26 DIAGNOSIS — K580 Irritable bowel syndrome with diarrhea: Secondary | ICD-10-CM | POA: Diagnosis not present

## 2018-09-26 DIAGNOSIS — D500 Iron deficiency anemia secondary to blood loss (chronic): Secondary | ICD-10-CM | POA: Diagnosis not present

## 2018-09-26 DIAGNOSIS — K259 Gastric ulcer, unspecified as acute or chronic, without hemorrhage or perforation: Secondary | ICD-10-CM | POA: Diagnosis not present

## 2018-09-26 DIAGNOSIS — Z8371 Family history of colonic polyps: Secondary | ICD-10-CM | POA: Diagnosis not present

## 2018-09-29 DIAGNOSIS — M545 Low back pain: Secondary | ICD-10-CM | POA: Diagnosis not present

## 2018-09-29 DIAGNOSIS — M5416 Radiculopathy, lumbar region: Secondary | ICD-10-CM | POA: Diagnosis not present

## 2018-09-30 MED FILL — FINASTERIDE 1 MG TABLET: 1 | 90 days supply | Qty: 90 | Fill #1

## 2018-10-10 MED FILL — RESTASIS 0.05% EYE EMULSION: 0.05 | 30 days supply | Qty: 60 | Fill #2

## 2018-10-24 DIAGNOSIS — M545 Low back pain: Secondary | ICD-10-CM | POA: Diagnosis not present

## 2018-10-24 DIAGNOSIS — H35411 Lattice degeneration of retina, right eye: Secondary | ICD-10-CM | POA: Diagnosis not present

## 2018-10-24 DIAGNOSIS — H353132 Nonexudative age-related macular degeneration, bilateral, intermediate dry stage: Secondary | ICD-10-CM | POA: Diagnosis not present

## 2018-10-24 DIAGNOSIS — H35372 Puckering of macula, left eye: Secondary | ICD-10-CM | POA: Diagnosis not present

## 2018-10-24 DIAGNOSIS — M5416 Radiculopathy, lumbar region: Secondary | ICD-10-CM | POA: Diagnosis not present

## 2018-10-24 DIAGNOSIS — H31091 Other chorioretinal scars, right eye: Secondary | ICD-10-CM | POA: Diagnosis not present

## 2018-10-29 DIAGNOSIS — G4733 Obstructive sleep apnea (adult) (pediatric): Secondary | ICD-10-CM | POA: Diagnosis not present

## 2018-11-10 MED FILL — RESTASIS 0.05% EYE EMULSION: 0.05 | 30 days supply | Qty: 60 | Fill #3

## 2018-11-20 MED FILL — ATORVASTATIN 10 MG TABLET: 10 | 90 days supply | Qty: 90 | Fill #0

## 2018-11-21 DIAGNOSIS — H353131 Nonexudative age-related macular degeneration, bilateral, early dry stage: Secondary | ICD-10-CM | POA: Diagnosis not present

## 2018-11-21 DIAGNOSIS — Z961 Presence of intraocular lens: Secondary | ICD-10-CM | POA: Diagnosis not present

## 2018-11-21 DIAGNOSIS — H35372 Puckering of macula, left eye: Secondary | ICD-10-CM | POA: Diagnosis not present

## 2018-11-21 DIAGNOSIS — H18413 Arcus senilis, bilateral: Secondary | ICD-10-CM | POA: Diagnosis not present

## 2018-11-21 DIAGNOSIS — H26491 Other secondary cataract, right eye: Secondary | ICD-10-CM | POA: Diagnosis not present

## 2018-11-24 DIAGNOSIS — L820 Inflamed seborrheic keratosis: Secondary | ICD-10-CM | POA: Diagnosis not present

## 2018-11-24 DIAGNOSIS — Z872 Personal history of diseases of the skin and subcutaneous tissue: Secondary | ICD-10-CM | POA: Diagnosis not present

## 2018-11-24 DIAGNOSIS — D485 Neoplasm of uncertain behavior of skin: Secondary | ICD-10-CM | POA: Diagnosis not present

## 2018-11-24 MED FILL — FINASTERIDE 1 MG TABLET: 1 | 90 days supply | Qty: 90 | Fill #0

## 2018-12-09 MED FILL — OMEPRAZOLE 40 MG CPDR: 40 | 30 days supply | Qty: 60 | Fill #2

## 2018-12-10 MED FILL — RESTASIS 0.05% EYE EMULSION: 0.05 | 30 days supply | Qty: 60 | Fill #4

## 2018-12-12 ENCOUNTER — Encounter: Payer: 59 | Admitting: Internal Medicine

## 2018-12-19 ENCOUNTER — Other Ambulatory Visit: Payer: Self-pay

## 2018-12-19 ENCOUNTER — Ambulatory Visit (INDEPENDENT_AMBULATORY_CARE_PROVIDER_SITE_OTHER): Payer: 59 | Admitting: Internal Medicine

## 2018-12-19 VITALS — BP 106/70 | HR 81 | Temp 97.7°F | Resp 16 | Ht 73.0 in | Wt 197.0 lb

## 2018-12-19 DIAGNOSIS — D649 Anemia, unspecified: Secondary | ICD-10-CM | POA: Insufficient documentation

## 2018-12-19 DIAGNOSIS — E7800 Pure hypercholesterolemia, unspecified: Secondary | ICD-10-CM

## 2018-12-19 DIAGNOSIS — Z85850 Personal history of malignant neoplasm of thyroid: Secondary | ICD-10-CM | POA: Diagnosis not present

## 2018-12-19 DIAGNOSIS — Z125 Encounter for screening for malignant neoplasm of prostate: Secondary | ICD-10-CM

## 2018-12-19 DIAGNOSIS — Z0000 Encounter for general adult medical examination without abnormal findings: Secondary | ICD-10-CM | POA: Diagnosis not present

## 2018-12-19 DIAGNOSIS — M5442 Lumbago with sciatica, left side: Secondary | ICD-10-CM | POA: Diagnosis not present

## 2018-12-19 DIAGNOSIS — D509 Iron deficiency anemia, unspecified: Secondary | ICD-10-CM

## 2018-12-19 DIAGNOSIS — G4733 Obstructive sleep apnea (adult) (pediatric): Secondary | ICD-10-CM | POA: Diagnosis not present

## 2018-12-19 LAB — CBC WITH DIFFERENTIAL/PLATELET
Basophils Absolute: 0 10*3/uL (ref 0.0–0.1)
Basophils Relative: 0.7 % (ref 0.0–3.0)
Eosinophils Absolute: 0.1 10*3/uL (ref 0.0–0.7)
Eosinophils Relative: 3.2 % (ref 0.0–5.0)
HCT: 42.1 % (ref 39.0–52.0)
Hemoglobin: 14.3 g/dL (ref 13.0–17.0)
Lymphocytes Relative: 35.3 % (ref 12.0–46.0)
Lymphs Abs: 1.4 10*3/uL (ref 0.7–4.0)
MCHC: 33.9 g/dL (ref 30.0–36.0)
MCV: 86.7 fl (ref 78.0–100.0)
Monocytes Absolute: 0.4 10*3/uL (ref 0.1–1.0)
Monocytes Relative: 10.4 % (ref 3.0–12.0)
Neutro Abs: 2 10*3/uL (ref 1.4–7.7)
Neutrophils Relative %: 50.4 % (ref 43.0–77.0)
Platelets: 198 10*3/uL (ref 150.0–400.0)
RBC: 4.85 Mil/uL (ref 4.22–5.81)
RDW: 13.3 % (ref 11.5–15.5)
WBC: 3.9 10*3/uL — ABNORMAL LOW (ref 4.0–10.5)

## 2018-12-19 LAB — COMPREHENSIVE METABOLIC PANEL
ALT: 23 U/L (ref 0–53)
AST: 16 U/L (ref 0–37)
Albumin: 4.5 g/dL (ref 3.5–5.2)
Alkaline Phosphatase: 104 U/L (ref 39–117)
BUN: 17 mg/dL (ref 6–23)
CO2: 28 mEq/L (ref 19–32)
Calcium: 9.1 mg/dL (ref 8.4–10.5)
Chloride: 105 mEq/L (ref 96–112)
Creatinine, Ser: 1.07 mg/dL (ref 0.40–1.50)
GFR: 70.43 mL/min (ref >60.00)
Glucose, Bld: 105 mg/dL — ABNORMAL HIGH (ref 70–99)
Potassium: 4.4 mEq/L (ref 3.5–5.1)
Sodium: 141 mEq/L (ref 135–145)
Total Bilirubin: 0.6 mg/dL (ref 0.2–1.2)
Total Protein: 6.9 g/dL (ref 6.0–8.3)

## 2018-12-19 LAB — PSA: PSA: 0.24 ng/mL (ref 0.10–4.00)

## 2018-12-19 LAB — FERRITIN: Ferritin: 74.4 ng/mL (ref 22.0–322.0)

## 2018-12-19 NOTE — Progress Notes (Signed)
Patient ID: Matthew Kelley, male   DOB: April 03, 1961, 58 y.o.   MRN: [id number]   Subjective:    Patient ID: Matthew Kelley, male    DOB: [birth year], 58 y.o.   MRN: [id number]  HPI  Patient here for his physical exam.  Doing well.  Feels good.  Is exercising.  Recently evaluated for IDA.  Had colonoscopy 05/29/18.  Recommended f/u in 5 years.  S/p EGD as well. Taking iron.  No chest pain.  No sob. No acid reflux.  No abdominal pain.  Bowels moving.  Back pain is better after therapy.  Still doing exercises.  Saw Dr Forde Dandy for f/u thyroid cancer.  Using cpap.     Past Medical History:  Diagnosis Date   Blood in stool    h/o   Cancer ([place])    thyroid cancer   History of chicken pox    Hypercholesterolemia    Sleep apnea    Vertigo    Past Surgical History:  Procedure Laterality Date   CHOLECYSTECTOMY  2000   TOTAL THYROIDECTOMY  2009   Family History  Problem Relation Age of Onset   Hyperlipidemia Father    Heart disease Father    Social History   Socioeconomic History   Marital status: Married    Spouse name: Not on file   Number of children: Not on file   Years of education: Not on file   Highest education level: Not on file  Occupational History   Not on file  Social Needs   Financial resource strain: Not on file   Food insecurity    Worry: Not on file    Inability: Not on file   Transportation needs    Medical: Not on file    Non-medical: Not on file  Tobacco Use   Smoking status: Never Smoker   Smokeless tobacco: Never Used  Substance and Sexual Activity   Alcohol use: Yes    Alcohol/week: 0.0 standard drinks   Drug use: No   Sexual activity: Not on file  Lifestyle   Physical activity    Days per week: Not on file    Minutes per session: Not on file   Stress: Not on file  Relationships   Social connections    Talks on phone: Not on file    Gets together: Not on file    Attends religious service: Not on file    Active member of  club or organization: Not on file    Attends meetings of clubs or organizations: Not on file    Relationship status: Not on file  Other Topics Concern   Not on file  Social History Narrative   Not on file     Review of Systems     Objective:    Physical Exam  BP 106/70    Pulse 81    Temp 97.7 F (36.5 C)    Resp 16    Ht 6\' 1"  (1.854 m)    Wt 197 lb (89.4 kg)    SpO2 97%    BMI 25.99 kg/m  Wt Readings from Last 3 Encounters:  12/19/18 197 lb (89.4 kg)  12/06/17 196 lb (88.9 kg)  09/27/17 198 lb (89.8 kg)     Lab Results  Component Value Date   WBC 3.9 (L) 12/19/2018   HGB 14.3 12/19/2018   HCT 42.1 12/19/2018   PLT 198.0 12/19/2018   GLUCOSE 105 (H) 12/19/2018   CHOL 154 02/28/2018   TRIG 63 02/28/2018  HDL 45 02/28/2018   LDLCALC 96 02/28/2018   ALT 23 12/19/2018   AST 16 12/19/2018   NA 141 12/19/2018   K 4.4 12/19/2018   CL 105 12/19/2018   CREATININE 1.07 12/19/2018   BUN 17 12/19/2018   CO2 28 12/19/2018   TSH 0.050 (L) 02/28/2018   PSA 0.24 12/19/2018   INR 1.0 06/09/2007   HGBA1C 5.6 02/28/2018    Mr Lumbar Spine Wo Contrast  Result Date: 08/23/2016 CLINICAL DATA:  Left-sided low back pain with sciatica. Persistent back pain with x-ray revealing minimal compression of L1 EXAM: MRI LUMBAR SPINE WITHOUT CONTRAST TECHNIQUE: Multiplanar, multisequence MR imaging of the lumbar spine was performed. No intravenous contrast was administered. COMPARISON:  Radiographs 08/10/2016 FINDINGS: Segmentation:  Normal Alignment:  Normal. Vertebrae: Negative for acute or chronic fracture. Negative for mass lesion. Normal bone marrow. No compression of L1 identified Conus medullaris: Extends to the T12-L1 level and appears normal. Paraspinal and other soft tissues: Negative Disc levels: L1-2:  Negative L2-3:  Negative L3-4: Mild disc degeneration and disc bulging. Small right extraforaminal disc protrusion with mild displacement of the L3 nerve root lateral to the  foramen. Mild facet and ligamentum flavum hypertrophy. Mild narrowing of the spinal canal L4-5: Mild disc space narrowing and disc bulging. Small extraforaminal disc protrusion on the left with displacement of the left L4 nerve root. Moderate facet and ligamentum flavum hypertrophy bilaterally with mild spinal stenosis. L5-S1:  Mild facet degeneration without stenosis. IMPRESSION: Small right extraforaminal disc protrusion L3-4 Mild spinal stenosis L4-5. Small extraforaminal disc protrusion on the left. Electronically Signed   By: Franchot Gallo M.D.   On: 08/23/2016 08:18       Assessment & Plan:   Problem List Items Addressed This Visit    Anemia    IDA.  Recently had EGD and colonoscopy 05/2018.  On prilosec.  Currently without GI symptoms.  Follow.        Relevant Orders   CBC with Differential/Platelet (Completed)   Ferritin (Completed)   Health care maintenance    Physical today 12/19/18.  Colonoscopy 05/29/18.  PSA 12/19/18 - .24.        Hypercholesterolemia - Primary    On lipitor.  Low cholesterol diet and exercise.  Follow lipid panel and liver function tests.        Relevant Orders   Comprehensive metabolic panel (Completed)   Low back pain    Better after therapy.        Obstructive sleep apnea    Continue cpap.       THYROID CANCER, HX OF    Followed by Dr Forde Dandy.  Recently evaluated.  Stable.        Other Visit Diagnoses    Prostate cancer screening       Relevant Orders   PSA (Completed)       Einar Pheasant, MD

## 2018-12-19 NOTE — Assessment & Plan Note (Addendum)
Physical today 12/19/18.  Colonoscopy 05/29/18.  PSA 12/19/18 - .24.

## 2018-12-21 ENCOUNTER — Encounter: Payer: Self-pay | Admitting: Internal Medicine

## 2018-12-21 NOTE — Assessment & Plan Note (Signed)
Better after therapy.

## 2018-12-21 NOTE — Assessment & Plan Note (Signed)
Followed by Dr Forde Dandy.  Recently evaluated.  Stable.

## 2018-12-21 NOTE — Assessment & Plan Note (Signed)
On lipitor.  Low cholesterol diet and exercise.  Follow lipid panel and liver function tests.   

## 2018-12-21 NOTE — Assessment & Plan Note (Signed)
Continue cpap.  

## 2018-12-21 NOTE — Assessment & Plan Note (Signed)
IDA.  Recently had EGD and colonoscopy 05/2018.  On prilosec.  Currently without GI symptoms.  Follow.

## 2018-12-23 ENCOUNTER — Other Ambulatory Visit: Payer: Self-pay | Admitting: Internal Medicine

## 2018-12-23 DIAGNOSIS — D509 Iron deficiency anemia, unspecified: Secondary | ICD-10-CM

## 2018-12-23 DIAGNOSIS — E7800 Pure hypercholesterolemia, unspecified: Secondary | ICD-10-CM

## 2018-12-23 NOTE — Progress Notes (Signed)
Orders placed for f/u labs.  

## 2019-01-09 MED FILL — OMEPRAZOLE 40 MG CPDR: 40 | 30 days supply | Qty: 60 | Fill #0

## 2019-01-09 MED FILL — RESTASIS 0.05% EYE EMULSION: 0.05 | 30 days supply | Qty: 60 | Fill #5

## 2019-01-21 LAB — BASIC METABOLIC PANEL
BUN: 19 (ref 4–21)
CO2: 23 — AB (ref 13–22)
Chloride: 106 (ref 99–108)
Creatinine: 1.2 (ref 0.6–1.3)
Glucose: 99
Potassium: 4.3 (ref 3.4–5.3)
Sodium: 142 (ref 137–147)

## 2019-01-21 LAB — CBC AND DIFFERENTIAL
HCT: 38 — AB (ref 41–53)
Hemoglobin: 12.8 — AB (ref 13.5–17.5)
Neutrophils Absolute: 2
Platelets: 216 (ref 150–399)
WBC: 4.7

## 2019-01-21 LAB — LIPID PANEL
Cholesterol: 166 (ref 0–200)
HDL: 41 (ref 35–70)
LDL Cholesterol: 113
Triglycerides: 58 (ref 40–160)

## 2019-01-21 LAB — HEPATIC FUNCTION PANEL
ALT: 19 (ref 10–40)
AST: 18 (ref 14–40)
Alkaline Phosphatase: 99 (ref 25–125)
Bilirubin, Total: 0.6

## 2019-01-21 LAB — COMPREHENSIVE METABOLIC PANEL
Albumin: 4.5 (ref 3.5–5.0)
Calcium: 9 (ref 8.7–10.7)
GFR calc Af Amer: 78
GFR calc non Af Amer: 67
Globulin: 2.2

## 2019-01-21 LAB — TSH: TSH: 0.04 — AB (ref 0.41–5.90)

## 2019-01-21 LAB — CBC: RBC: 4.33 (ref 3.87–5.11)

## 2019-01-21 LAB — PSA: PSA: 0.2

## 2019-01-27 DIAGNOSIS — G4733 Obstructive sleep apnea (adult) (pediatric): Secondary | ICD-10-CM | POA: Diagnosis not present

## 2019-02-09 MED FILL — RESTASIS 0.05% EYE EMULSION: 0.05 | 30 days supply | Qty: 60 | Fill #0

## 2019-02-13 MED FILL — ATORVASTATIN 10 MG TABLET: 10 | 90 days supply | Qty: 90 | Fill #1

## 2019-02-27 ENCOUNTER — Other Ambulatory Visit: Payer: 59

## 2019-03-21 ENCOUNTER — Encounter: Payer: Self-pay | Admitting: Internal Medicine

## 2019-03-21 DIAGNOSIS — R7989 Other specified abnormal findings of blood chemistry: Secondary | ICD-10-CM

## 2019-03-21 DIAGNOSIS — Z85850 Personal history of malignant neoplasm of thyroid: Secondary | ICD-10-CM

## 2019-03-25 NOTE — Telephone Encounter (Signed)
Are you okay ordering these labs. I will order, just need the okay from you first. Pt is coming in Jan 15.

## 2019-03-25 NOTE — Telephone Encounter (Signed)
Orders placed for labs.  I put them in to be drawn here.  Is he having them drawn here?

## 2019-03-26 ENCOUNTER — Encounter: Payer: Self-pay | Admitting: Internal Medicine

## 2019-03-26 NOTE — Telephone Encounter (Signed)
Yes, he is having drawn here and already scheduled. Closing note.

## 2019-03-27 DIAGNOSIS — G4733 Obstructive sleep apnea (adult) (pediatric): Secondary | ICD-10-CM | POA: Diagnosis not present

## 2019-04-23 ENCOUNTER — Telehealth: Payer: Self-pay

## 2019-04-23 DIAGNOSIS — Z85850 Personal history of malignant neoplasm of thyroid: Secondary | ICD-10-CM

## 2019-04-23 NOTE — Telephone Encounter (Signed)
T3 added

## 2019-04-23 NOTE — Telephone Encounter (Signed)
Pt wanted to have a T3  Added on to labs

## 2019-04-23 NOTE — Telephone Encounter (Signed)
Free T3 added.

## 2019-04-24 ENCOUNTER — Other Ambulatory Visit: Payer: Self-pay

## 2019-04-24 ENCOUNTER — Other Ambulatory Visit (INDEPENDENT_AMBULATORY_CARE_PROVIDER_SITE_OTHER): Payer: 59

## 2019-04-24 DIAGNOSIS — R7989 Other specified abnormal findings of blood chemistry: Secondary | ICD-10-CM

## 2019-04-24 DIAGNOSIS — Z85850 Personal history of malignant neoplasm of thyroid: Secondary | ICD-10-CM

## 2019-04-24 DIAGNOSIS — E7800 Pure hypercholesterolemia, unspecified: Secondary | ICD-10-CM | POA: Diagnosis not present

## 2019-04-24 DIAGNOSIS — D509 Iron deficiency anemia, unspecified: Secondary | ICD-10-CM | POA: Diagnosis not present

## 2019-04-24 LAB — CBC WITH DIFFERENTIAL/PLATELET
Basophils Absolute: 0 10*3/uL (ref 0.0–0.1)
Basophils Relative: 0.5 % (ref 0.0–3.0)
Eosinophils Absolute: 0.1 10*3/uL (ref 0.0–0.7)
Eosinophils Relative: 2.4 % (ref 0.0–5.0)
HCT: 43.1 % (ref 39.0–52.0)
Hemoglobin: 14.4 g/dL (ref 13.0–17.0)
Lymphocytes Relative: 36.3 % (ref 12.0–46.0)
Lymphs Abs: 1.6 10*3/uL (ref 0.7–4.0)
MCHC: 33.5 g/dL (ref 30.0–36.0)
MCV: 86.6 fl (ref 78.0–100.0)
Monocytes Absolute: 0.4 10*3/uL (ref 0.1–1.0)
Monocytes Relative: 9.8 % (ref 3.0–12.0)
Neutro Abs: 2.2 10*3/uL (ref 1.4–7.7)
Neutrophils Relative %: 51 % (ref 43.0–77.0)
Platelets: 215 10*3/uL (ref 150.0–400.0)
RBC: 4.98 Mil/uL (ref 4.22–5.81)
RDW: 13 % (ref 11.5–15.5)
WBC: 4.4 10*3/uL (ref 4.0–10.5)

## 2019-04-24 LAB — FERRITIN: Ferritin: 28.8 ng/mL (ref 22.0–322.0)

## 2019-04-24 LAB — LIPID PANEL
Cholesterol: 192 mg/dL (ref 0–200)
HDL: 47 mg/dL (ref >39.00)
LDL Cholesterol: 133 mg/dL — ABNORMAL HIGH (ref 0–99)
NonHDL: 145.49
Total CHOL/HDL Ratio: 4
Triglycerides: 61 mg/dL (ref 0.0–149.0)
VLDL: 12.2 mg/dL (ref 0.0–40.0)

## 2019-04-24 LAB — VITAMIN D 25 HYDROXY (VIT D DEFICIENCY, FRACTURES): VITD: 32.73 ng/mL (ref 30.00–100.00)

## 2019-04-24 LAB — TSH: TSH: 0.05 u[IU]/mL — ABNORMAL LOW (ref 0.35–4.50)

## 2019-04-24 LAB — T3, FREE: T3, Free: 3 pg/mL (ref 2.3–4.2)

## 2019-04-24 LAB — COMPREHENSIVE METABOLIC PANEL
ALT: 23 U/L (ref 0–53)
AST: 18 U/L (ref 0–37)
Albumin: 4.5 g/dL (ref 3.5–5.2)
Alkaline Phosphatase: 87 U/L (ref 39–117)
BUN: 21 mg/dL (ref 6–23)
CO2: 26 mEq/L (ref 19–32)
Calcium: 9.2 mg/dL (ref 8.4–10.5)
Chloride: 105 mEq/L (ref 96–112)
Creatinine, Ser: 1.09 mg/dL (ref 0.40–1.50)
GFR: 68.86 mL/min (ref >60.00)
Glucose, Bld: 117 mg/dL — ABNORMAL HIGH (ref 70–99)
Potassium: 4.2 mEq/L (ref 3.5–5.1)
Sodium: 140 mEq/L (ref 135–145)
Total Bilirubin: 0.7 mg/dL (ref 0.2–1.2)
Total Protein: 7 g/dL (ref 6.0–8.3)

## 2019-04-24 LAB — T4, FREE: Free T4: 1.58 ng/dL (ref 0.60–1.60)

## 2019-04-27 ENCOUNTER — Encounter: Payer: Self-pay | Admitting: Internal Medicine

## 2019-04-30 ENCOUNTER — Encounter: Payer: Self-pay | Admitting: Internal Medicine

## 2019-04-30 LAB — TESTOSTERONE, FREE & TOTAL
Free Testosterone: 101.8 pg/mL (ref 35.0–155.0)
Testosterone, Total, LC-MS-MS: 572 ng/dL (ref 250–1100)

## 2019-04-30 LAB — THYROGLOBULIN LEVEL: Thyroglobulin: <0.1 ng/mL — ABNORMAL LOW

## 2019-04-30 MED ORDER — ATORVASTATIN CALCIUM 20 MG PO TABS: 20.0000 mg | ORAL_TABLET | Freq: Every day | ORAL | 1 refills | Status: DC

## 2019-04-30 NOTE — Telephone Encounter (Signed)
I have notified him of his labs and sent in lipitor 20mg .  Please forward his labs to Dr Reynold BowenLovett Calender (endocrinology)

## 2019-05-01 ENCOUNTER — Encounter: Payer: Self-pay | Admitting: Internal Medicine

## 2019-05-01 DIAGNOSIS — Z85850 Personal history of malignant neoplasm of thyroid: Secondary | ICD-10-CM

## 2019-05-01 DIAGNOSIS — D509 Iron deficiency anemia, unspecified: Secondary | ICD-10-CM

## 2019-05-01 DIAGNOSIS — E7800 Pure hypercholesterolemia, unspecified: Secondary | ICD-10-CM

## 2019-05-01 NOTE — Telephone Encounter (Signed)
Labs forwarded

## 2019-05-05 NOTE — Telephone Encounter (Signed)
Please schedule him for fasting labs.  See date he requests.  I have placed the orders.

## 2019-05-07 LAB — STATUS REPORT

## 2019-05-08 ENCOUNTER — Encounter: Payer: Self-pay | Admitting: Lab

## 2019-05-12 LAB — TGAB+THYROGLOBULIN IMA OR RIA: Thyroglobulin Antibody: 1 IU/mL — ABNORMAL HIGH (ref 0.0–0.9)

## 2019-05-12 LAB — THYROGLOBULIN BY RIA

## 2019-06-16 ENCOUNTER — Encounter: Payer: Self-pay | Admitting: Internal Medicine

## 2019-06-16 DIAGNOSIS — D649 Anemia, unspecified: Secondary | ICD-10-CM

## 2019-06-16 NOTE — Telephone Encounter (Signed)
Order placed for B12 level.

## 2019-06-26 DIAGNOSIS — G4733 Obstructive sleep apnea (adult) (pediatric): Secondary | ICD-10-CM | POA: Diagnosis not present

## 2019-07-07 ENCOUNTER — Encounter: Payer: Self-pay | Admitting: Internal Medicine

## 2019-07-17 ENCOUNTER — Encounter: Payer: Self-pay | Admitting: Internal Medicine

## 2019-07-17 LAB — CBC AND DIFFERENTIAL
HCT: 41 (ref 41–53)
Hemoglobin: 13.6 (ref 13.5–17.5)
WBC: 4.4

## 2019-07-17 LAB — BASIC METABOLIC PANEL
BUN: 17 (ref 4–21)
Chloride: 105 (ref 99–108)
Creatinine: 1.1 (ref <=1.3)
Glucose: 115
Potassium: 4.3 (ref 3.4–5.3)
Sodium: 141 (ref 137–147)

## 2019-07-17 LAB — HEPATIC FUNCTION PANEL
ALT: 21 (ref 10–40)
AST: 19 (ref 14–40)
Alkaline Phosphatase: 105 (ref 25–125)

## 2019-07-17 LAB — CBC: RBC: 4.61 (ref 3.87–5.11)

## 2019-07-17 LAB — COMPREHENSIVE METABOLIC PANEL
Albumin: 4.5 (ref 3.5–5.0)
Calcium: 8.9 (ref 8.7–10.7)
Globulin: 2.1

## 2019-07-20 LAB — LIPID PANEL
Cholesterol: 147 (ref 0–200)
HDL: 42 (ref 35–70)
LDL Cholesterol: 91
Triglycerides: 68 (ref 40–160)

## 2019-08-03 NOTE — Telephone Encounter (Signed)
[facility] would like a call back regarding a prescription for cpap and the settings and the chart note for the last visit regarding cpap.  Please call them at [phone]

## 2019-08-05 NOTE — Telephone Encounter (Signed)
Prescription and chart note sent over to [facility] for a new machine and supplies.

## 2019-08-05 NOTE — Telephone Encounter (Signed)
[facility] called they are needing chart notes faxed over to them on why a replacement is needed for the CPAP  Fax number [phone]

## 2019-08-07 NOTE — Telephone Encounter (Signed)
Please confirm with [facility] exactly what is needed.  I was informed he needed a visit to discuss compliance.  It sounds like he is saying they were contacting us because his machine was broken, so I am not sure what is needed.  Please confirm.  Can work in if need to, but please confirm.  Thanks

## 2019-08-10 NOTE — Telephone Encounter (Signed)
He is needing a visit because the office notes that I sent them were older than 6 months. They are requiring documentation within the last 6 months stating that he is using and benefiting from his CPAP.

## 2019-08-10 NOTE — Telephone Encounter (Signed)
I can see him on Friday at 8:30 virtual visit or 4:30 virtual visit.

## 2019-08-14 ENCOUNTER — Telehealth (INDEPENDENT_AMBULATORY_CARE_PROVIDER_SITE_OTHER): Payer: 59 | Admitting: Internal Medicine

## 2019-08-14 DIAGNOSIS — C73 Malignant neoplasm of thyroid gland: Secondary | ICD-10-CM | POA: Diagnosis not present

## 2019-08-14 DIAGNOSIS — D509 Iron deficiency anemia, unspecified: Secondary | ICD-10-CM

## 2019-08-14 DIAGNOSIS — E7800 Pure hypercholesterolemia, unspecified: Secondary | ICD-10-CM

## 2019-08-14 DIAGNOSIS — G4733 Obstructive sleep apnea (adult) (pediatric): Secondary | ICD-10-CM | POA: Diagnosis not present

## 2019-08-14 DIAGNOSIS — I700 Atherosclerosis of aorta: Secondary | ICD-10-CM | POA: Diagnosis not present

## 2019-08-14 DIAGNOSIS — Z85850 Personal history of malignant neoplasm of thyroid: Secondary | ICD-10-CM

## 2019-08-14 DIAGNOSIS — M5416 Radiculopathy, lumbar region: Secondary | ICD-10-CM | POA: Diagnosis not present

## 2019-08-14 DIAGNOSIS — H3530 Unspecified macular degeneration: Secondary | ICD-10-CM | POA: Diagnosis not present

## 2019-08-14 DIAGNOSIS — E890 Postprocedural hypothyroidism: Secondary | ICD-10-CM | POA: Diagnosis not present

## 2019-08-14 DIAGNOSIS — E559 Vitamin D deficiency, unspecified: Secondary | ICD-10-CM | POA: Diagnosis not present

## 2019-08-14 DIAGNOSIS — E7849 Other hyperlipidemia: Secondary | ICD-10-CM | POA: Diagnosis not present

## 2019-08-14 DIAGNOSIS — G4730 Sleep apnea, unspecified: Secondary | ICD-10-CM | POA: Diagnosis not present

## 2019-08-14 NOTE — Progress Notes (Signed)
Patient ID: Matthew Kelley, male   DOB: [birth year], 59 y.o.   MRN: [id number]   Virtual Visit via video Note  This visit type was conducted due to national recommendations for restrictions regarding the COVID-19 pandemic (e.g. social distancing).  This format is felt to be most appropriate for this patient at this time.  All issues noted in this document were discussed and addressed.  No physical exam was performed (except for noted visual exam findings with Video Visits).   I connected with Matthew Kelley by a video enabled telemedicine application and verified that I am speaking with the correct person using two identifiers. Location patient: home Location provider: work  Persons participating in the virtual visit: patient, provider  The limitations, risks, security and privacy concerns of performing an evaluation and management service by video and the availability of in person appointments have been discussed. The patient expressed understanding and agreed to proceed.   Reason for visit: work in appt  HPI: Work in appt to discuss CPAP use.  He uses CPAP regularly.  Uses every night.  Uses at least 7 hours per night.  Benefits from using.  Sleeps better.  More rested.  His current machine is not functioning properly.  Needs a new machine.  Did see Dr Forde Dandy this am.  No changes made.  Labs drawn - f/u thyroid.  Eating well.  Bowels - overall stable.  Recent - loose stool.  Intermittent episodes.  Discussed f/u cbc and iron studies.  Will review recent labs once available.     ROS: See pertinent positives and negatives per HPI.  Past Medical History:  Diagnosis Date  . Blood in stool    h/o  . Cancer [facility])    thyroid cancer  . History of chicken pox   . Hypercholesterolemia   . Sleep apnea   . Vertigo     Past Surgical History:  Procedure Laterality Date  . CHOLECYSTECTOMY  2000  . TOTAL THYROIDECTOMY  2009    Family History  Problem Relation Age of Onset  . Hyperlipidemia  Father   . Heart disease Father     SOCIAL HX: reviewed.    Current Outpatient Medications:  .  atorvastatin (LIPITOR) 20 MG tablet, Take 1 tablet (20 mg total) by mouth daily., Disp: 90 tablet, Rfl: 1 .  cholecalciferol (VITAMIN D) 1000 units tablet, Take 1 tablet (1,000 Units total) by mouth daily., Disp: 90 tablet, Rfl: 1 .  finasteride (PROPECIA) 1 MG tablet, Take 1 tablet (1 mg total) by mouth daily., Disp: 90 tablet, Rfl: 1 .  levothyroxine (SYNTHROID, LEVOTHROID) 175 MCG tablet, Take 1 tablet (175 mcg total) by mouth daily before breakfast., Disp: 90 tablet, Rfl: 1 .  omeprazole (PRILOSEC) 40 MG capsule, Take 40 mg by mouth daily., Disp: , Rfl:  .  RESTASIS 0.05 % ophthalmic emulsion, instill 1 drop into both eyes twice a day, Disp: , Rfl: 0 .  sildenafil (REVATIO) 20 MG tablet, 2-5 tabs 1 hour prior to intercourse (Patient not taking: Reported on 09/27/2017), Disp: 30 tablet, Rfl: 3  EXAM:  GENERAL: alert, oriented, appears well and in no acute distress  HEENT: atraumatic, conjunttiva clear, no obvious abnormalities on inspection of external nose and ears  NECK: normal movements of the head and neck  LUNGS: on inspection no signs of respiratory distress, breathing rate appears normal, no obvious gross SOB, gasping or wheezing  CV: no obvious cyanosis  PSYCH/NEURO: pleasant and cooperative, no obvious depression or anxiety, speech and  thought processing grossly intact  ASSESSMENT AND PLAN:  Discussed the following assessment and plan:  THYROID CANCER, HX OF Followed by Dr Forde Dandy.  Just evaluated today.  Had f/u thyroid labs.  Will need results.    Obstructive sleep apnea Uses cpap regularly - nightly.  Using at least 7 hours per night. Benefits from using.  Sleeps better.  More rested.  cpap not functioning correctly now.  Needs a new device.  Form completed.    Hypercholesterolemia On lipitor.  Low cholesterol diet and exercise.  Follow lipid panel and liver function  tests.    Anemia IDA.  Recently had EGD and colonoscopy - 05/2018.  On prilosec.  Will need f/u cbc and iron studies.  Review recent labs.      I discussed the assessment and treatment plan with the patient. The patient was provided an opportunity to ask questions and all were answered. The patient agreed with the plan and demonstrated an understanding of the instructions.   The patient was advised to call back or seek an in-person evaluation if the symptoms worsen or if the condition fails to improve as anticipated.    Einar Pheasant, MD

## 2019-08-16 ENCOUNTER — Encounter: Payer: Self-pay | Admitting: Internal Medicine

## 2019-08-16 NOTE — Assessment & Plan Note (Signed)
On lipitor.  Low cholesterol diet and exercise.  Follow lipid panel and liver function tests.   

## 2019-08-16 NOTE — Assessment & Plan Note (Signed)
Uses cpap regularly - nightly.  Using at least 7 hours per night. Benefits from using.  Sleeps better.  More rested.  cpap not functioning correctly now.  Needs a new device.  Form completed.

## 2019-08-16 NOTE — Assessment & Plan Note (Signed)
Followed by Dr Forde Dandy.  Just evaluated today.  Had f/u thyroid labs.  Will need results.

## 2019-08-16 NOTE — Assessment & Plan Note (Signed)
IDA.  Recently had EGD and colonoscopy - 05/2018.  On prilosec.  Will need f/u cbc and iron studies.  Review recent labs.

## 2019-08-20 ENCOUNTER — Encounter: Payer: Self-pay | Admitting: Internal Medicine

## 2019-08-22 ENCOUNTER — Telehealth: Payer: Self-pay | Admitting: Internal Medicine

## 2019-08-22 DIAGNOSIS — R739 Hyperglycemia, unspecified: Secondary | ICD-10-CM

## 2019-08-22 DIAGNOSIS — D509 Iron deficiency anemia, unspecified: Secondary | ICD-10-CM

## 2019-08-22 NOTE — Telephone Encounter (Signed)
My chart message sent to pt about f/u labs.,

## 2019-08-24 DIAGNOSIS — G4733 Obstructive sleep apnea (adult) (pediatric): Secondary | ICD-10-CM | POA: Diagnosis not present

## 2019-09-04 ENCOUNTER — Other Ambulatory Visit: Payer: Self-pay

## 2019-09-04 ENCOUNTER — Other Ambulatory Visit (INDEPENDENT_AMBULATORY_CARE_PROVIDER_SITE_OTHER): Payer: 59

## 2019-09-04 DIAGNOSIS — R739 Hyperglycemia, unspecified: Secondary | ICD-10-CM | POA: Diagnosis not present

## 2019-09-04 DIAGNOSIS — Z85850 Personal history of malignant neoplasm of thyroid: Secondary | ICD-10-CM

## 2019-09-04 DIAGNOSIS — D509 Iron deficiency anemia, unspecified: Secondary | ICD-10-CM

## 2019-09-04 DIAGNOSIS — E7800 Pure hypercholesterolemia, unspecified: Secondary | ICD-10-CM

## 2019-09-04 LAB — COMPREHENSIVE METABOLIC PANEL
ALT: 22 U/L (ref 0–53)
AST: 17 U/L (ref 0–37)
Albumin: 4.6 g/dL (ref 3.5–5.2)
Alkaline Phosphatase: 103 U/L (ref 39–117)
BUN: 21 mg/dL (ref 6–23)
CO2: 30 mEq/L (ref 19–32)
Calcium: 9.1 mg/dL (ref 8.4–10.5)
Chloride: 104 mEq/L (ref 96–112)
Creatinine, Ser: 1.19 mg/dL (ref 0.40–1.50)
GFR: 62.15 mL/min (ref >60.00)
Glucose, Bld: 113 mg/dL — ABNORMAL HIGH (ref 70–99)
Potassium: 4 mEq/L (ref 3.5–5.1)
Sodium: 139 mEq/L (ref 135–145)
Total Bilirubin: 0.5 mg/dL (ref 0.2–1.2)
Total Protein: 6.9 g/dL (ref 6.0–8.3)

## 2019-09-04 LAB — LIPID PANEL
Cholesterol: 162 mg/dL (ref 0–200)
HDL: 41.8 mg/dL (ref >39.00)
LDL Cholesterol: 102 mg/dL — ABNORMAL HIGH (ref 0–99)
NonHDL: 119.7
Total CHOL/HDL Ratio: 4
Triglycerides: 90 mg/dL (ref 0.0–149.0)
VLDL: 18 mg/dL (ref 0.0–40.0)

## 2019-09-04 LAB — CBC WITH DIFFERENTIAL/PLATELET
Basophils Absolute: 0 10*3/uL (ref 0.0–0.1)
Basophils Relative: 0.3 % (ref 0.0–3.0)
Eosinophils Absolute: 0.1 10*3/uL (ref 0.0–0.7)
Eosinophils Relative: 2.4 % (ref 0.0–5.0)
HCT: 40.5 % (ref 39.0–52.0)
Hemoglobin: 13.8 g/dL (ref 13.0–17.0)
Lymphocytes Relative: 31.1 % (ref 12.0–46.0)
Lymphs Abs: 1.9 10*3/uL (ref 0.7–4.0)
MCHC: 34 g/dL (ref 30.0–36.0)
MCV: 86.6 fl (ref 78.0–100.0)
Monocytes Absolute: 0.6 10*3/uL (ref 0.1–1.0)
Monocytes Relative: 9.2 % (ref 3.0–12.0)
Neutro Abs: 3.4 10*3/uL (ref 1.4–7.7)
Neutrophils Relative %: 57 % (ref 43.0–77.0)
Platelets: 216 10*3/uL (ref 150.0–400.0)
RBC: 4.68 Mil/uL (ref 4.22–5.81)
RDW: 13.4 % (ref 11.5–15.5)
WBC: 6 10*3/uL (ref 4.0–10.5)

## 2019-09-04 LAB — IBC + FERRITIN
Ferritin: 30.1 ng/mL (ref 22.0–322.0)
Iron: 69 ug/dL (ref 42–165)
Saturation Ratios: 18.2 % — ABNORMAL LOW (ref 20.0–50.0)
Transferrin: 271 mg/dL (ref 212.0–360.0)

## 2019-09-04 LAB — VITAMIN B12: Vitamin B-12: 301 pg/mL (ref 211–911)

## 2019-09-04 LAB — TSH: TSH: 0.22 u[IU]/mL — ABNORMAL LOW (ref 0.35–4.50)

## 2019-09-04 LAB — HEMOGLOBIN A1C: Hgb A1c MFr Bld: 5.7 % (ref 4.6–6.5)

## 2019-09-04 NOTE — Addendum Note (Signed)
Addended by: Tor Netters I on: 09/04/2019 08:37 AM   Modules accepted: Orders

## 2019-09-18 DIAGNOSIS — H353131 Nonexudative age-related macular degeneration, bilateral, early dry stage: Secondary | ICD-10-CM | POA: Diagnosis not present

## 2019-09-18 DIAGNOSIS — H35372 Puckering of macula, left eye: Secondary | ICD-10-CM | POA: Diagnosis not present

## 2019-09-24 DIAGNOSIS — G4733 Obstructive sleep apnea (adult) (pediatric): Secondary | ICD-10-CM | POA: Diagnosis not present

## 2019-09-25 ENCOUNTER — Other Ambulatory Visit: Payer: Self-pay | Admitting: Ophthalmology

## 2019-09-25 DIAGNOSIS — G4733 Obstructive sleep apnea (adult) (pediatric): Secondary | ICD-10-CM | POA: Diagnosis not present

## 2019-10-02 ENCOUNTER — Other Ambulatory Visit: Payer: Self-pay | Admitting: Internal Medicine

## 2019-10-24 DIAGNOSIS — G4733 Obstructive sleep apnea (adult) (pediatric): Secondary | ICD-10-CM | POA: Diagnosis not present

## 2019-10-30 DIAGNOSIS — H43812 Vitreous degeneration, left eye: Secondary | ICD-10-CM | POA: Diagnosis not present

## 2019-10-30 DIAGNOSIS — H43392 Other vitreous opacities, left eye: Secondary | ICD-10-CM | POA: Diagnosis not present

## 2019-10-30 DIAGNOSIS — H353132 Nonexudative age-related macular degeneration, bilateral, intermediate dry stage: Secondary | ICD-10-CM | POA: Diagnosis not present

## 2019-10-30 DIAGNOSIS — H35372 Puckering of macula, left eye: Secondary | ICD-10-CM | POA: Diagnosis not present

## 2019-11-10 ENCOUNTER — Telehealth: Payer: Self-pay | Admitting: Internal Medicine

## 2019-11-10 NOTE — Telephone Encounter (Signed)
Patient returning Trisha's phone call from 11/06/19 @ 4:37pm.

## 2019-11-10 NOTE — Telephone Encounter (Signed)
Called wife to give lab results.

## 2019-11-24 DIAGNOSIS — G4733 Obstructive sleep apnea (adult) (pediatric): Secondary | ICD-10-CM | POA: Diagnosis not present

## 2019-12-25 ENCOUNTER — Ambulatory Visit (INDEPENDENT_AMBULATORY_CARE_PROVIDER_SITE_OTHER): Payer: 59 | Admitting: Internal Medicine

## 2019-12-25 ENCOUNTER — Other Ambulatory Visit: Payer: Self-pay

## 2019-12-25 ENCOUNTER — Encounter: Payer: Self-pay | Admitting: Internal Medicine

## 2019-12-25 VITALS — BP 110/68 | HR 78 | Temp 98.2°F | Ht 72.99 in | Wt 195.4 lb

## 2019-12-25 DIAGNOSIS — G4733 Obstructive sleep apnea (adult) (pediatric): Secondary | ICD-10-CM

## 2019-12-25 DIAGNOSIS — Z125 Encounter for screening for malignant neoplasm of prostate: Secondary | ICD-10-CM

## 2019-12-25 DIAGNOSIS — D509 Iron deficiency anemia, unspecified: Secondary | ICD-10-CM

## 2019-12-25 DIAGNOSIS — Z23 Encounter for immunization: Secondary | ICD-10-CM

## 2019-12-25 DIAGNOSIS — E7800 Pure hypercholesterolemia, unspecified: Secondary | ICD-10-CM | POA: Diagnosis not present

## 2019-12-25 DIAGNOSIS — Z85850 Personal history of malignant neoplasm of thyroid: Secondary | ICD-10-CM | POA: Diagnosis not present

## 2019-12-25 DIAGNOSIS — Z0000 Encounter for general adult medical examination without abnormal findings: Secondary | ICD-10-CM | POA: Diagnosis not present

## 2019-12-25 LAB — CBC WITH DIFFERENTIAL/PLATELET
Basophils Absolute: 0 10*3/uL (ref 0.0–0.1)
Basophils Relative: 0.6 % (ref 0.0–3.0)
Eosinophils Absolute: 0.2 10*3/uL (ref 0.0–0.7)
Eosinophils Relative: 3.5 % (ref 0.0–5.0)
HCT: 39.3 % (ref 39.0–52.0)
Hemoglobin: 13.3 g/dL (ref 13.0–17.0)
Lymphocytes Relative: 30.1 % (ref 12.0–46.0)
Lymphs Abs: 1.3 10*3/uL (ref 0.7–4.0)
MCHC: 34 g/dL (ref 30.0–36.0)
MCV: 85.8 fl (ref 78.0–100.0)
Monocytes Absolute: 0.4 10*3/uL (ref 0.1–1.0)
Monocytes Relative: 9.4 % (ref 3.0–12.0)
Neutro Abs: 2.4 10*3/uL (ref 1.4–7.7)
Neutrophils Relative %: 56.4 % (ref 43.0–77.0)
Platelets: 199 10*3/uL (ref 150.0–400.0)
RBC: 4.58 Mil/uL (ref 4.22–5.81)
RDW: 14.4 % (ref 11.5–15.5)
WBC: 4.3 10*3/uL (ref 4.0–10.5)

## 2019-12-25 LAB — BASIC METABOLIC PANEL
BUN: 17 mg/dL (ref 6–23)
CO2: 30 mEq/L (ref 19–32)
Calcium: 9 mg/dL (ref 8.4–10.5)
Chloride: 104 mEq/L (ref 96–112)
Creatinine, Ser: 1.2 mg/dL (ref 0.40–1.50)
GFR: 61.49 mL/min (ref >60.00)
Glucose, Bld: 108 mg/dL — ABNORMAL HIGH (ref 70–99)
Potassium: 4.2 mEq/L (ref 3.5–5.1)
Sodium: 140 mEq/L (ref 135–145)

## 2019-12-25 LAB — VITAMIN B12: Vitamin B-12: 599 pg/mL (ref 211–911)

## 2019-12-25 LAB — HEPATIC FUNCTION PANEL
ALT: 17 U/L (ref 0–53)
AST: 15 U/L (ref 0–37)
Albumin: 4.4 g/dL (ref 3.5–5.2)
Alkaline Phosphatase: 85 U/L (ref 39–117)
Bilirubin, Direct: 0.1 mg/dL (ref 0.0–0.3)
Total Bilirubin: 0.8 mg/dL (ref 0.2–1.2)
Total Protein: 6.7 g/dL (ref 6.0–8.3)

## 2019-12-25 LAB — LIPID PANEL
Cholesterol: 135 mg/dL (ref 0–200)
HDL: 42.4 mg/dL (ref >39.00)
LDL Cholesterol: 79 mg/dL (ref 0–99)
NonHDL: 92.45
Total CHOL/HDL Ratio: 3
Triglycerides: 66 mg/dL (ref 0.0–149.0)
VLDL: 13.2 mg/dL (ref 0.0–40.0)

## 2019-12-25 LAB — TSH: TSH: 0.31 u[IU]/mL — ABNORMAL LOW (ref 0.35–4.50)

## 2019-12-25 LAB — PSA: PSA: 0.16 ng/mL (ref 0.10–4.00)

## 2019-12-25 NOTE — Progress Notes (Signed)
Patient ID: Matthew Kelley, male   DOB: 1960-12-04, 59 y.o.   MRN: [id number]   Subjective:    Patient ID: Matthew Kelley, male    DOB: [birth year], 58 y.o.   MRN: [id number]  HPI This visit occurred during the SARS-CoV-2 public health emergency.  Safety protocols were in place, including screening questions prior to the visit, additional usage of staff PPE, and extensive cleaning of exam room while observing appropriate contact time as indicated for disinfecting solutions.  Patient here for his physical exam. He reports he is doing relatively well.  Stays active.  No chest pain or sob.  No acid reflux or abdominal pain reported.  Bowels moving.  No blood.  S/p EGD and colonoscopy 05/2018.  Using cpap.   Past Medical History:  Diagnosis Date  . Blood in stool    h/o  . Cancer [facility])    thyroid cancer  . History of chicken pox   . Hypercholesterolemia   . Sleep apnea   . Vertigo    Past Surgical History:  Procedure Laterality Date  . CHOLECYSTECTOMY  2000  . TOTAL THYROIDECTOMY  2009   Family History  Problem Relation Age of Onset  . Hyperlipidemia Father   . Heart disease Father    Social History   Socioeconomic History  . Marital status: Married    Spouse name: Not on file  . Number of children: Not on file  . Years of education: Not on file  . Highest education level: Not on file  Occupational History  . Not on file  Tobacco Use  . Smoking status: Never Smoker  . Smokeless tobacco: Never Used  Substance and Sexual Activity  . Alcohol use: Yes    Alcohol/week: 0.0 standard drinks  . Drug use: No  . Sexual activity: Not on file  Other Topics Concern  . Not on file  Social History Narrative  . Not on file   Social Determinants of Health   Financial Resource Strain:   . Difficulty of Paying Living Expenses: Not on file  Food Insecurity:   . Worried About Charity fundraiser in the Last Year: Not on file  . Ran Out of Food in the Last Year: Not on file   Transportation Needs:   . Lack of Transportation (Medical): Not on file  . Lack of Transportation (Non-Medical): Not on file  Physical Activity:   . Days of Exercise per Week: Not on file  . Minutes of Exercise per Session: Not on file  Stress:   . Feeling of Stress : Not on file  Social Connections:   . Frequency of Communication with Friends and Family: Not on file  . Frequency of Social Gatherings with Friends and Family: Not on file  . Attends Religious Services: Not on file  . Active Member of Clubs or Organizations: Not on file  . Attends Archivist Meetings: Not on file  . Marital Status: Not on file    Outpatient Encounter Medications as of 12/25/2019  Medication Sig  . atorvastatin (LIPITOR) 20 MG tablet TAKE 1 TABLET BY MOUTH DAILY.  . cholecalciferol (VITAMIN D) 1000 units tablet Take 1 tablet (1,000 Units total) by mouth daily.  . finasteride (PROPECIA) 1 MG tablet Take 1 tablet (1 mg total) by mouth daily.  Marland Kitchen levothyroxine (SYNTHROID, LEVOTHROID) 175 MCG tablet Take 1 tablet (175 mcg total) by mouth daily before breakfast.  . Multiple Vitamins-Minerals (PRESERVISION AREDS 2) CAPS   . omeprazole (PRILOSEC) 40 MG  capsule Take 40 mg by mouth daily.  . RESTASIS 0.05 % ophthalmic emulsion instill 1 drop into both eyes twice a day  . valACYclovir (VALTREX) 1000 MG tablet Take 1,000 mg by mouth 3 (three) times daily.  . [DISCONTINUED] sildenafil (REVATIO) 20 MG tablet 2-5 tabs 1 hour prior to intercourse (Patient not taking: Reported on 09/27/2017)   No facility-administered encounter medications on file as of 12/25/2019.    Review of Systems  Constitutional: Negative for appetite change and unexpected weight change.  HENT: Negative for congestion and sinus pressure.   Eyes: Negative for pain and visual disturbance.  Respiratory: Negative for cough, chest tightness and shortness of breath.   Cardiovascular: Negative for chest pain, palpitations and leg swelling.   Gastrointestinal: Negative for abdominal pain, diarrhea, nausea and vomiting.  Genitourinary: Negative for difficulty urinating and dysuria.  Musculoskeletal: Negative for joint swelling and myalgias.  Skin: Negative for color change and rash.  Neurological: Negative for dizziness, light-headedness and headaches.  Hematological: Negative for adenopathy. Does not bruise/bleed easily.  Psychiatric/Behavioral: Negative for agitation and dysphoric mood.       Objective:    Physical Exam Vitals reviewed.  Constitutional:      General: He is not in acute distress.    Appearance: Normal appearance. He is well-developed.  HENT:     Head: Normocephalic and atraumatic.     Right Ear: External ear normal.     Left Ear: External ear normal.  Eyes:     General: No scleral icterus.       Right eye: No discharge.        Left eye: No discharge.     Conjunctiva/sclera: Conjunctivae normal.  Neck:     Thyroid: No thyromegaly.  Cardiovascular:     Rate and Rhythm: Normal rate and regular rhythm.  Pulmonary:     Effort: No respiratory distress.     Breath sounds: Normal breath sounds. No wheezing.  Abdominal:     General: Bowel sounds are normal.     Palpations: Abdomen is soft.     Tenderness: There is no abdominal tenderness.  Genitourinary:    Comments: Not performed.  Musculoskeletal:        General: No swelling or tenderness.     Cervical back: Neck supple. No tenderness.  Lymphadenopathy:     Cervical: No cervical adenopathy.  Skin:    Findings: No erythema or rash.  Neurological:     Mental Status: He is alert and oriented to person, place, and time.  Psychiatric:        Mood and Affect: Mood normal.        Behavior: Behavior normal.     BP 110/68 (BP Location: Left Arm, Patient Position: Sitting)   Pulse 78   Temp 98.2 F (36.8 C)   Ht 6' 0.99" (1.854 m)   Wt 195 lb 6.4 oz (88.6 kg)   SpO2 96%   BMI 25.79 kg/m  Wt Readings from Last 3 Encounters:  12/25/19 195 lb  6.4 oz (88.6 kg)  12/19/18 197 lb (89.4 kg)  12/06/17 196 lb (88.9 kg)     Lab Results  Component Value Date   WBC 4.3 12/25/2019   HGB 13.3 12/25/2019   HCT 39.3 12/25/2019   PLT 199.0 12/25/2019   GLUCOSE 108 (H) 12/25/2019   CHOL 135 12/25/2019   TRIG 66.0 12/25/2019   HDL 42.40 12/25/2019   LDLCALC 79 12/25/2019   ALT 17 12/25/2019   AST 15 12/25/2019  NA 140 12/25/2019   K 4.2 12/25/2019   CL 104 12/25/2019   CREATININE 1.20 12/25/2019   BUN 17 12/25/2019   CO2 30 12/25/2019   TSH 0.31 (L) 12/25/2019   PSA 0.16 12/25/2019   INR 1.0 06/09/2007   HGBA1C 5.7 09/04/2019    MR Lumbar Spine Wo Contrast  Result Date: 08/23/2016 CLINICAL DATA:  Left-sided low back pain with sciatica. Persistent back pain with x-ray revealing minimal compression of L1 EXAM: MRI LUMBAR SPINE WITHOUT CONTRAST TECHNIQUE: Multiplanar, multisequence MR imaging of the lumbar spine was performed. No intravenous contrast was administered. COMPARISON:  Radiographs 08/10/2016 FINDINGS: Segmentation:  Normal Alignment:  Normal. Vertebrae: Negative for acute or chronic fracture. Negative for mass lesion. Normal bone marrow. No compression of L1 identified Conus medullaris: Extends to the T12-L1 level and appears normal. Paraspinal and other soft tissues: Negative Disc levels: L1-2:  Negative L2-3:  Negative L3-4: Mild disc degeneration and disc bulging. Small right extraforaminal disc protrusion with mild displacement of the L3 nerve root lateral to the foramen. Mild facet and ligamentum flavum hypertrophy. Mild narrowing of the spinal canal L4-5: Mild disc space narrowing and disc bulging. Small extraforaminal disc protrusion on the left with displacement of the left L4 nerve root. Moderate facet and ligamentum flavum hypertrophy bilaterally with mild spinal stenosis. L5-S1:  Mild facet degeneration without stenosis. IMPRESSION: Small right extraforaminal disc protrusion L3-4 Mild spinal stenosis L4-5. Small  extraforaminal disc protrusion on the left. Electronically Signed   By: Franchot Gallo M.D.   On: 08/23/2016 08:18       Assessment & Plan:   Problem List Items Addressed This Visit    THYROID CANCER, HX OF    Followed by Dr Forde Dandy.  Evaluated recently.  Had f/u thyroid labs. States ok.  Follow.        Relevant Orders   TSH (Completed)   Obstructive sleep apnea    Uses cpap regularly.  Follow.       Hypercholesterolemia    On lipitor.  Low cholesterol diet and exercise.  Follow lipid panel and liver function tests.        Relevant Orders   Hepatic function panel (Completed)   Lipid panel (Completed)   Basic metabolic panel (Completed)   Health care maintenance    Physical today 12/25/19.  Colonoscopy 05/2018.  Check psa.       Anemia    IDA.  Recently had EGD and colonsocopy.  On prilosec.  Follow cbc and iron studies.        Relevant Orders   CBC with Differential/Platelet (Completed)   Vitamin B12 (Completed)    Other Visit Diagnoses    Routine general medical examination at a health care facility    -  Primary   Prostate cancer screening       Relevant Orders   PSA (Completed)   Need for immunization against influenza       Relevant Orders   Flu Vaccine QUAD 36+ mos IM (Completed)       Einar Pheasant, MD

## 2019-12-25 NOTE — Assessment & Plan Note (Addendum)
Physical today 12/25/19.  Colonoscopy 05/2018.  Check psa.

## 2020-01-03 ENCOUNTER — Encounter: Payer: Self-pay | Admitting: Internal Medicine

## 2020-01-03 NOTE — Assessment & Plan Note (Signed)
Uses cpap regularly.  Follow.

## 2020-01-03 NOTE — Assessment & Plan Note (Signed)
On lipitor.  Low cholesterol diet and exercise.  Follow lipid panel and liver function tests.   

## 2020-01-03 NOTE — Assessment & Plan Note (Signed)
IDA.  Recently had EGD and colonsocopy.  On prilosec.  Follow cbc and iron studies.

## 2020-01-03 NOTE — Assessment & Plan Note (Signed)
Followed by Dr Forde Dandy.  Evaluated recently.  Had f/u thyroid labs. States ok.  Follow.

## 2020-01-08 ENCOUNTER — Other Ambulatory Visit: Payer: Self-pay | Admitting: Nurse Practitioner

## 2020-01-08 ENCOUNTER — Other Ambulatory Visit: Payer: Self-pay | Admitting: Dermatology

## 2020-01-11 ENCOUNTER — Other Ambulatory Visit: Payer: Self-pay | Admitting: Dermatology

## 2020-01-21 ENCOUNTER — Other Ambulatory Visit: Payer: Self-pay

## 2020-01-21 MED ORDER — MUPIROCIN 2 % EX OINT: 1.0000 "application " | TOPICAL_OINTMENT | Freq: Every day | CUTANEOUS | 11 refills | Status: DC

## 2020-02-17 ENCOUNTER — Encounter: Payer: Self-pay | Admitting: Internal Medicine

## 2020-02-19 DIAGNOSIS — E785 Hyperlipidemia, unspecified: Secondary | ICD-10-CM | POA: Diagnosis not present

## 2020-02-19 DIAGNOSIS — D500 Iron deficiency anemia secondary to blood loss (chronic): Secondary | ICD-10-CM | POA: Diagnosis not present

## 2020-02-19 DIAGNOSIS — E559 Vitamin D deficiency, unspecified: Secondary | ICD-10-CM | POA: Diagnosis not present

## 2020-02-19 DIAGNOSIS — E890 Postprocedural hypothyroidism: Secondary | ICD-10-CM | POA: Diagnosis not present

## 2020-02-19 DIAGNOSIS — I700 Atherosclerosis of aorta: Secondary | ICD-10-CM | POA: Diagnosis not present

## 2020-02-19 DIAGNOSIS — G4730 Sleep apnea, unspecified: Secondary | ICD-10-CM | POA: Diagnosis not present

## 2020-02-19 DIAGNOSIS — H3530 Unspecified macular degeneration: Secondary | ICD-10-CM | POA: Diagnosis not present

## 2020-02-19 DIAGNOSIS — C73 Malignant neoplasm of thyroid gland: Secondary | ICD-10-CM | POA: Diagnosis not present

## 2020-03-15 ENCOUNTER — Other Ambulatory Visit: Payer: Self-pay

## 2020-03-15 MED ORDER — KETOCONAZOLE 2 % EX SHAM: MEDICATED_SHAMPOO | CUTANEOUS | 3 refills | Status: DC

## 2020-03-24 DIAGNOSIS — G4733 Obstructive sleep apnea (adult) (pediatric): Secondary | ICD-10-CM | POA: Diagnosis not present

## 2020-04-05 ENCOUNTER — Other Ambulatory Visit: Payer: Self-pay

## 2020-04-05 DIAGNOSIS — H35372 Puckering of macula, left eye: Secondary | ICD-10-CM | POA: Diagnosis not present

## 2020-04-05 DIAGNOSIS — H43812 Vitreous degeneration, left eye: Secondary | ICD-10-CM | POA: Diagnosis not present

## 2020-04-05 DIAGNOSIS — H353132 Nonexudative age-related macular degeneration, bilateral, intermediate dry stage: Secondary | ICD-10-CM | POA: Diagnosis not present

## 2020-04-05 DIAGNOSIS — H43392 Other vitreous opacities, left eye: Secondary | ICD-10-CM | POA: Diagnosis not present

## 2020-04-05 MED ORDER — AZITHROMYCIN 250 MG PO TABS: 250.0000 mg | ORAL_TABLET | ORAL | 1 refills | Status: DC

## 2020-04-05 MED ORDER — PENICILLIN V POTASSIUM 500 MG PO TABS: 500.0000 mg | ORAL_TABLET | Freq: Two times a day (BID) | ORAL | 1 refills | Status: DC

## 2020-05-18 ENCOUNTER — Other Ambulatory Visit: Payer: Self-pay

## 2020-05-29 LAB — HM COLONOSCOPY

## 2020-05-30 ENCOUNTER — Other Ambulatory Visit: Payer: Self-pay | Admitting: Internal Medicine

## 2020-06-16 ENCOUNTER — Encounter
Admission: EM | Disposition: A | Discharge status: Home / Self Care | Payer: Self-pay | Source: Home / Self Care | Attending: Internal Medicine

## 2020-06-16 ENCOUNTER — Emergency Department
Admission: EM | Admit: 2020-06-16 | Discharge: 2020-06-16 | Disposition: A | Discharge status: Home / Self Care | Payer: 59 | Source: Home / Self Care | Attending: Emergency Medicine | Admitting: Emergency Medicine

## 2020-06-16 ENCOUNTER — Encounter: Payer: Self-pay | Admitting: Cardiology

## 2020-06-16 ENCOUNTER — Inpatient Hospital Stay
Admission: EM | Admit: 2020-06-16 | Discharge: 2020-06-18 | DRG: 247 | Disposition: A | Discharge status: Home / Self Care | Payer: 59 | Attending: Internal Medicine | Admitting: Internal Medicine

## 2020-06-16 DIAGNOSIS — Z85850 Personal history of malignant neoplasm of thyroid: Secondary | ICD-10-CM

## 2020-06-16 DIAGNOSIS — I2102 ST elevation (STEMI) myocardial infarction involving left anterior descending coronary artery: Secondary | ICD-10-CM | POA: Diagnosis not present

## 2020-06-16 DIAGNOSIS — I493 Ventricular premature depolarization: Secondary | ICD-10-CM | POA: Diagnosis present

## 2020-06-16 DIAGNOSIS — I213 ST elevation (STEMI) myocardial infarction of unspecified site: Secondary | ICD-10-CM | POA: Diagnosis not present

## 2020-06-16 DIAGNOSIS — E890 Postprocedural hypothyroidism: Secondary | ICD-10-CM | POA: Diagnosis not present

## 2020-06-16 DIAGNOSIS — Z79890 Hormone replacement therapy (postmenopausal): Secondary | ICD-10-CM | POA: Diagnosis not present

## 2020-06-16 DIAGNOSIS — I2109 ST elevation (STEMI) myocardial infarction involving other coronary artery of anterior wall: Principal | ICD-10-CM | POA: Diagnosis present

## 2020-06-16 DIAGNOSIS — I2510 Atherosclerotic heart disease of native coronary artery without angina pectoris: Secondary | ICD-10-CM

## 2020-06-16 DIAGNOSIS — E039 Hypothyroidism, unspecified: Secondary | ICD-10-CM | POA: Insufficient documentation

## 2020-06-16 DIAGNOSIS — Z20822 Contact with and (suspected) exposure to covid-19: Secondary | ICD-10-CM | POA: Diagnosis present

## 2020-06-16 DIAGNOSIS — E785 Hyperlipidemia, unspecified: Secondary | ICD-10-CM | POA: Diagnosis present

## 2020-06-16 DIAGNOSIS — E7800 Pure hypercholesterolemia, unspecified: Secondary | ICD-10-CM | POA: Diagnosis present

## 2020-06-16 DIAGNOSIS — R0689 Other abnormalities of breathing: Secondary | ICD-10-CM | POA: Diagnosis not present

## 2020-06-16 DIAGNOSIS — Z9049 Acquired absence of other specified parts of digestive tract: Secondary | ICD-10-CM

## 2020-06-16 DIAGNOSIS — R079 Chest pain, unspecified: Secondary | ICD-10-CM | POA: Diagnosis not present

## 2020-06-16 DIAGNOSIS — Z83438 Family history of other disorder of lipoprotein metabolism and other lipidemia: Secondary | ICD-10-CM

## 2020-06-16 DIAGNOSIS — G4733 Obstructive sleep apnea (adult) (pediatric): Secondary | ICD-10-CM | POA: Diagnosis present

## 2020-06-16 DIAGNOSIS — Z79899 Other long term (current) drug therapy: Secondary | ICD-10-CM

## 2020-06-16 DIAGNOSIS — I499 Cardiac arrhythmia, unspecified: Secondary | ICD-10-CM | POA: Diagnosis not present

## 2020-06-16 DIAGNOSIS — R0789 Other chest pain: Secondary | ICD-10-CM | POA: Diagnosis not present

## 2020-06-16 DIAGNOSIS — R7989 Other specified abnormal findings of blood chemistry: Secondary | ICD-10-CM

## 2020-06-16 DIAGNOSIS — Z8249 Family history of ischemic heart disease and other diseases of the circulatory system: Secondary | ICD-10-CM | POA: Diagnosis not present

## 2020-06-16 HISTORY — DX: Hypothyroidism, unspecified: E03.9

## 2020-06-16 HISTORY — PX: CORONARY/GRAFT ACUTE MI REVASCULARIZATION: CATH118305

## 2020-06-16 HISTORY — DX: Atherosclerotic heart disease of native coronary artery without angina pectoris: I25.10

## 2020-06-16 HISTORY — DX: Postprocedural hypothyroidism: E89.0

## 2020-06-16 HISTORY — PX: LEFT HEART CATH AND CORONARY ANGIOGRAPHY: CATH118249

## 2020-06-16 HISTORY — DX: Malignant neoplasm of thyroid gland: C73

## 2020-06-16 LAB — COMPREHENSIVE METABOLIC PANEL
ALT: 20 U/L (ref 0–44)
AST: 22 U/L (ref 15–41)
Albumin: 4.4 g/dL (ref 3.5–5.0)
Alkaline Phosphatase: 79 U/L (ref 38–126)
Anion gap: 13 (ref 5–15)
BUN: 23 mg/dL (ref 8–23)
CO2: 21 mmol/L — ABNORMAL LOW (ref 22–32)
Calcium: 9 mg/dL (ref 8.9–10.3)
Chloride: 105 mmol/L (ref 98–111)
Creatinine, Ser: 1.25 mg/dL — ABNORMAL HIGH (ref 0.61–1.24)
GFR, Estimated: >60 mL/min (ref >60)
Glucose, Bld: 124 mg/dL — ABNORMAL HIGH (ref 70–99)
Potassium: 3.5 mmol/L (ref 3.5–5.1)
Sodium: 139 mmol/L (ref 135–145)
Total Bilirubin: 1.1 mg/dL (ref 0.3–1.2)
Total Protein: 7.3 g/dL (ref 6.5–8.1)

## 2020-06-16 LAB — RESP PANEL BY RT-PCR (FLU A&B, COVID) ARPGX2
Influenza A by PCR: NEGATIVE
Influenza B by PCR: NEGATIVE
SARS Coronavirus 2 by RT PCR: NEGATIVE

## 2020-06-16 LAB — CBC WITH DIFFERENTIAL/PLATELET
Abs Immature Granulocytes: 0.02 10*3/uL (ref 0.00–0.07)
Basophils Absolute: 0.1 10*3/uL (ref 0.0–0.1)
Basophils Relative: 1 %
Eosinophils Absolute: 0.2 10*3/uL (ref 0.0–0.5)
Eosinophils Relative: 2 %
HCT: 37 % — ABNORMAL LOW (ref 39.0–52.0)
Hemoglobin: 12.5 g/dL — ABNORMAL LOW (ref 13.0–17.0)
Immature Granulocytes: 0 %
Lymphocytes Relative: 48 %
Lymphs Abs: 4 10*3/uL (ref 0.7–4.0)
MCH: 27.9 pg (ref 26.0–34.0)
MCHC: 33.8 g/dL (ref 30.0–36.0)
MCV: 82.6 fL (ref 80.0–100.0)
Monocytes Absolute: 0.8 10*3/uL (ref 0.1–1.0)
Monocytes Relative: 10 %
Neutro Abs: 3.1 10*3/uL (ref 1.7–7.7)
Neutrophils Relative %: 39 %
Platelets: 274 10*3/uL (ref 150–400)
RBC: 4.48 MIL/uL (ref 4.22–5.81)
RDW: 14.6 % (ref 11.5–15.5)
WBC: 8.1 10*3/uL (ref 4.0–10.5)
nRBC: 0 % (ref 0.0–0.2)

## 2020-06-16 LAB — POCT ACTIVATED CLOTTING TIME: Activated Clotting Time: 416 seconds

## 2020-06-16 LAB — PROTIME-INR
INR: 1 (ref 0.8–1.2)
Prothrombin Time: 12.7 seconds (ref 11.4–15.2)

## 2020-06-16 LAB — APTT: aPTT: 27 seconds (ref 24–36)

## 2020-06-16 LAB — TROPONIN I (HIGH SENSITIVITY): Troponin I (High Sensitivity): 7 ng/L (ref <18)

## 2020-06-16 SURGERY — CORONARY/GRAFT ACUTE MI REVASCULARIZATION
Anesthesia: Moderate Sedation

## 2020-06-16 MED ORDER — NITROGLYCERIN 1 MG/10 ML FOR IR/CATH LAB
INTRA_ARTERIAL | Status: DC | PRN
  Administered 2020-06-16 (×2): 200 ug via INTRACORONARY

## 2020-06-16 MED ORDER — AMIODARONE IV BOLUS ONLY 150 MG/100ML
INTRAVENOUS | Status: AC
  Filled 2020-06-16: qty 100

## 2020-06-16 MED ORDER — ACETAMINOPHEN 325 MG PO TABS: 650.0000 mg | ORAL_TABLET | ORAL | Status: DC | PRN

## 2020-06-16 MED ORDER — AMIODARONE HCL IN DEXTROSE 360-4.14 MG/200ML-% IV SOLN
60.0000 mg/h | INTRAVENOUS | Status: DC
  Administered 2020-06-16: 60 mg/h via INTRAVENOUS
  Filled 2020-06-16: qty 200

## 2020-06-16 MED ORDER — AMIODARONE HCL IN DEXTROSE 360-4.14 MG/200ML-% IV SOLN
INTRAVENOUS | Status: AC
  Filled 2020-06-16: qty 200

## 2020-06-16 MED ORDER — SODIUM CHLORIDE 0.9% FLUSH: 3.0000 mL | INTRAVENOUS | Status: DC | PRN

## 2020-06-16 MED ORDER — MIDAZOLAM HCL 2 MG/2ML IJ SOLN
INTRAMUSCULAR | Status: DC | PRN
  Administered 2020-06-16: 1 mg via INTRAVENOUS

## 2020-06-16 MED ORDER — HEPARIN (PORCINE) 25000 UT/250ML-% IV SOLN: 1100.0000 [IU]/h | INTRAVENOUS | Status: DC

## 2020-06-16 MED ORDER — ONDANSETRON HCL 4 MG/2ML IJ SOLN: 4.0000 mg | Freq: Four times a day (QID) | INTRAMUSCULAR | Status: DC | PRN

## 2020-06-16 MED ORDER — HEPARIN SODIUM (PORCINE) 1000 UNIT/ML IJ SOLN
INTRAMUSCULAR | Status: AC
  Filled 2020-06-16: qty 2

## 2020-06-16 MED ORDER — LEVOTHYROXINE SODIUM 50 MCG PO TABS
175.0000 ug | ORAL_TABLET | Freq: Every day | ORAL | Status: DC
  Administered 2020-06-17 – 2020-06-18 (×2): 175 ug via ORAL
  Filled 2020-06-16 (×2): qty 2

## 2020-06-16 MED ORDER — HEPARIN SODIUM (PORCINE) 1000 UNIT/ML IJ SOLN
INTRAMUSCULAR | Status: DC | PRN
  Administered 2020-06-16: 12000 [IU] via INTRAVENOUS

## 2020-06-16 MED ORDER — FENTANYL CITRATE (PF) 100 MCG/2ML IJ SOLN
INTRAMUSCULAR | Status: AC
  Filled 2020-06-16: qty 2

## 2020-06-16 MED ORDER — MIDAZOLAM HCL 2 MG/2ML IJ SOLN
INTRAMUSCULAR | Status: AC
  Filled 2020-06-16: qty 2

## 2020-06-16 MED ORDER — TICAGRELOR 90 MG PO TABS
ORAL_TABLET | ORAL | Status: AC
  Filled 2020-06-16: qty 2

## 2020-06-16 MED ORDER — LIDOCAINE HCL (PF) 1 % IJ SOLN
INTRAMUSCULAR | Status: DC | PRN
Start: 2020-06-16 — End: 2020-06-16
  Administered 2020-06-16: 2 mL

## 2020-06-16 MED ORDER — HEPARIN SODIUM (PORCINE) 5000 UNIT/ML IJ SOLN
4000.0000 [IU] | Freq: Once | INTRAMUSCULAR | Status: AC
  Administered 2020-06-16: 4000 [IU] via INTRAVENOUS

## 2020-06-16 MED ORDER — CHLORHEXIDINE GLUCONATE CLOTH 2 % EX PADS
6.0000 | MEDICATED_PAD | Freq: Every day | CUTANEOUS | Status: DC
  Administered 2020-06-17: 6 via TOPICAL

## 2020-06-16 MED ORDER — FENTANYL CITRATE (PF) 100 MCG/2ML IJ SOLN
INTRAMUSCULAR | Status: DC | PRN
  Administered 2020-06-16: 25 ug via INTRAVENOUS

## 2020-06-16 MED ORDER — LIDOCAINE HCL (PF) 1 % IJ SOLN
INTRAMUSCULAR | Status: AC
  Filled 2020-06-16: qty 30

## 2020-06-16 MED ORDER — AMIODARONE HCL 150 MG/3ML IV SOLN: INTRAVENOUS | Status: DC | PRN

## 2020-06-16 MED ORDER — VERAPAMIL HCL 2.5 MG/ML IV SOLN
INTRAVENOUS | Status: AC
  Filled 2020-06-16: qty 2

## 2020-06-16 MED ORDER — ATORVASTATIN CALCIUM 80 MG PO TABS
80.0000 mg | ORAL_TABLET | Freq: Every day | ORAL | Status: DC
  Administered 2020-06-16 – 2020-06-18 (×3): 80 mg via ORAL
  Filled 2020-06-16: qty 1
  Filled 2020-06-16: qty 4
  Filled 2020-06-16 (×2): qty 1

## 2020-06-16 MED ORDER — AMIODARONE IV BOLUS ONLY 150 MG/100ML
150.0000 mg | Freq: Once | INTRAVENOUS | Status: AC
  Administered 2020-06-16: 150 mg via INTRAVENOUS

## 2020-06-16 MED ORDER — TICAGRELOR 90 MG PO TABS
ORAL_TABLET | ORAL | Status: DC | PRN
  Administered 2020-06-16: 180 mg via ORAL

## 2020-06-16 MED ORDER — HYDRALAZINE HCL 20 MG/ML IJ SOLN: 10.0000 mg | INTRAMUSCULAR | Status: AC | PRN

## 2020-06-16 MED ORDER — METOPROLOL TARTRATE 50 MG PO TABS
25.0000 mg | ORAL_TABLET | Freq: Two times a day (BID) | ORAL | Status: DC
  Administered 2020-06-16 – 2020-06-18 (×3): 25 mg via ORAL
  Filled 2020-06-16 (×4): qty 1

## 2020-06-16 MED ORDER — SODIUM CHLORIDE 0.9 % IV SOLN
250.0000 mL | INTRAVENOUS | Status: DC | PRN
Start: 2020-06-17 — End: 2020-06-18

## 2020-06-16 MED ORDER — HEPARIN (PORCINE) IN NACL 1000-0.9 UT/500ML-% IV SOLN
INTRAVENOUS | Status: AC
  Filled 2020-06-16: qty 1000

## 2020-06-16 MED ORDER — SODIUM CHLORIDE 0.9 % WEIGHT BASED INFUSION
1.0000 mL/kg/h | INTRAVENOUS | Status: AC
  Administered 2020-06-16: 1 mL/kg/h via INTRAVENOUS

## 2020-06-16 MED ORDER — TICAGRELOR 90 MG PO TABS
90.0000 mg | ORAL_TABLET | Freq: Two times a day (BID) | ORAL | Status: DC
  Administered 2020-06-17 – 2020-06-18 (×3): 90 mg via ORAL
  Filled 2020-06-16 (×3): qty 1

## 2020-06-16 MED ORDER — SODIUM CHLORIDE 0.9% FLUSH
3.0000 mL | Freq: Two times a day (BID) | INTRAVENOUS | Status: DC
  Administered 2020-06-17 – 2020-06-18 (×2): 3 mL via INTRAVENOUS

## 2020-06-16 MED ORDER — ASPIRIN 81 MG PO CHEW
81.0000 mg | CHEWABLE_TABLET | Freq: Every day | ORAL | Status: DC
  Administered 2020-06-17 – 2020-06-18 (×2): 81 mg via ORAL
  Filled 2020-06-16 (×2): qty 1

## 2020-06-16 MED ORDER — ORAL CARE MOUTH RINSE
15.0000 mL | Freq: Two times a day (BID) | OROMUCOSAL | Status: DC
  Administered 2020-06-16 – 2020-06-17 (×3): 15 mL via OROMUCOSAL

## 2020-06-16 MED ORDER — SODIUM CHLORIDE 0.9 % IV BOLUS
1000.0000 mL | Freq: Once | INTRAVENOUS | Status: AC
  Administered 2020-06-16: 1000 mL via INTRAVENOUS

## 2020-06-16 MED ORDER — IOHEXOL 300 MG/ML  SOLN
INTRAMUSCULAR | Status: DC | PRN
  Administered 2020-06-16: 350 mL

## 2020-06-16 MED ORDER — HEPARIN (PORCINE) IN NACL 1000-0.9 UT/500ML-% IV SOLN
INTRAVENOUS | Status: DC | PRN
  Administered 2020-06-16 (×2): 500 mL

## 2020-06-16 MED ORDER — LABETALOL HCL 5 MG/ML IV SOLN: 10.0000 mg | INTRAVENOUS | Status: AC | PRN

## 2020-06-16 MED ORDER — AMIODARONE HCL IN DEXTROSE 360-4.14 MG/200ML-% IV SOLN: 60.0000 mg/h | INTRAVENOUS | Status: DC

## 2020-06-16 SURGICAL SUPPLY — 22 items
BALLN TREK RX 2.5X15 (BALLOONS) ×2
BALLN ~~LOC~~ EUPHORA RX 3.5X20 (BALLOONS) ×2
BALLOON TREK RX 2.5X15 (BALLOONS) ×1 IMPLANT
BALLOON ~~LOC~~ EUPHORA RX 3.5X20 (BALLOONS) ×1 IMPLANT
CATH INFINITI 5FR ANG PIGTAIL (CATHETERS) ×2 IMPLANT
CATH INFINITI JR4 5F (CATHETERS) ×2 IMPLANT
CATH VISTA GUIDE 6FR XB3.5 (CATHETERS) ×2 IMPLANT
COVER EZ STRL 42X30 (DRAPES) ×2 IMPLANT
DEVICE RAD TR BAND REGULAR (VASCULAR PRODUCTS) ×2 IMPLANT
DRAPE BRACHIAL (DRAPES) ×2 IMPLANT
GLIDESHEATH SLEND SS 6F .021 (SHEATH) ×2 IMPLANT
GUIDEWIRE INQWIRE 1.5J.035X260 (WIRE) ×1 IMPLANT
INQWIRE 1.5J .035X260CM (WIRE) ×2
KIT ENCORE 26 ADVANTAGE (KITS) ×2 IMPLANT
KIT MANI 3VAL PERCEP (MISCELLANEOUS) ×2 IMPLANT
PACK CARDIAC CATH (CUSTOM PROCEDURE TRAY) ×2 IMPLANT
PROTECTION STATION PRESSURIZED (MISCELLANEOUS) ×2
STATION PROTECTION PRESSURIZED (MISCELLANEOUS) ×1 IMPLANT
STENT RESOLUTE ONYX 3.0X26 (Permanent Stent) ×2 IMPLANT
WIRE ASAHI PROWATER 180CM (WIRE) ×2 IMPLANT
WIRE G HI TQ BMW 190 (WIRE) ×2 IMPLANT
WIRE GUIDERIGHT .035X150 (WIRE) ×2 IMPLANT

## 2020-06-16 NOTE — ED Provider Notes (Signed)
[facility] Emergency Department Provider Note  ____________________________________________  Time seen: Approximately 7:47 PM  I have reviewed the triage vital signs and the nursing notes.   HISTORY  Chief Complaint Code STEMI    HPI Matthew Kelley is a 60 y.o. male with a history of hypercholesterolemia on Lipitor, hypothyroidism on Synthroid who comes to the ED with acute onset of central chest pain and diaphoresis, 10/10, no aggravating or alleviating factors.  Onset was during weightlifting workout.   Was severe enough that he laid down on the floor while EMS was called.  No loss of consciousness.  EMS gave 324 mg of aspirin and Nitropaste to the chest.  Patient denies any prior cardiac history.  No history of PCI.  No blood thinner use.   Past Medical History:  Diagnosis Date  . Blood in stool    h/o  . Cancer [facility])    thyroid cancer  . History of chicken pox   . Hypercholesterolemia   . Sleep apnea   . Vertigo      Patient Active Problem List   Diagnosis Date Noted  . Anemia 12/19/2018  . Low back pain 08/19/2016  . Health care maintenance 07/30/2014  . Hypercholesterolemia 08/01/2013  . Hemangioma 08/01/2013  . Right hip pain 08/01/2013  . Low testosterone 08/01/2013  . Detached retina 08/01/2013  . Obstructive sleep apnea 06/10/2009  . THYROID CANCER, HX OF 06/10/2009     Past Surgical History:  Procedure Laterality Date  . CHOLECYSTECTOMY  2000  . TOTAL THYROIDECTOMY  2009     Prior to Admission medications   Medication Sig Start Date End Date Taking? Authorizing Provider  azithromycin (ZITHROMAX Z-PAK) 250 MG tablet Take 1 tablet (250 mg total) by mouth as directed. 04/05/20   Brendolyn Patty, MD  penicillin v potassium (VEETID) 500 MG tablet Take 1 tablet (500 mg total) by mouth in the morning and at bedtime. 04/05/20   Brendolyn Patty, MD  atorvastatin (LIPITOR) 20 MG tablet TAKE 1 TABLET BY MOUTH DAILY. 05/30/20   Einar Pheasant, MD  cholecalciferol (VITAMIN D) 1000 units tablet Take 1 tablet (1,000 Units total) by mouth daily. 08/23/16   Einar Pheasant, MD  finasteride (PROPECIA) 1 MG tablet TAKE 1 TABLET BY MOUTH ONCE DAILY 01/11/20   Brendolyn Patty, MD  ketoconazole (NIZORAL) 2 % shampoo Massage into scalp once daily as needed. Let sit several minutes before rinsing. 03/15/20   Brendolyn Patty, MD  levothyroxine (SYNTHROID, LEVOTHROID) 175 MCG tablet Take 1 tablet (175 mcg total) by mouth daily before breakfast. 12/26/15   Einar Pheasant, MD  Multiple Vitamins-Minerals (PRESERVISION AREDS 2) CAPS  07/11/13   [provider]  mupirocin ointment (BACTROBAN) 2 % Apply 1 application topically daily. 01/21/20   Brendolyn Patty, MD  omeprazole (PRILOSEC) 40 MG capsule TAKE 1 CAPSULE BY MOUTH TWICE DAILY 01/08/20   Denice Paradise A, NP  RESTASIS 0.05 % ophthalmic emulsion instill 1 drop into both eyes twice a day 07/26/15   [provider]  valACYclovir (VALTREX) 1000 MG tablet Take 1,000 mg by mouth 3 (three) times daily. 11/03/19   [provider]     Allergies Patient has no known allergies.   Family History  Problem Relation Age of Onset  . Hyperlipidemia Father   . Heart disease Father     Social History Social History   Tobacco Use  . Smoking status: Never Smoker  . Smokeless tobacco: Never Used  Substance Use Topics  . Alcohol  use: Yes    Alcohol/week: 0.0 standard drinks  . Drug use: No    Review of Systems  Constitutional:   No fever or chills.  ENT:   No sore throat. No rhinorrhea. Cardiovascular: Positive chest pain without syncope. Respiratory:   No dyspnea or cough. Gastrointestinal:   Negative for abdominal pain, vomiting and diarrhea.  Musculoskeletal:   Negative for focal pain or swelling All other systems reviewed and are negative except as documented above in ROS and HPI.  ____________________________________________   PHYSICAL EXAM:  VITAL SIGNS: ED  Triage Vitals  Enc Vitals Group     BP      Pulse      Resp      Temp      Temp src      SpO2      Weight      Height      Head Circumference      Peak Flow      Pain Score      Pain Loc      Pain Edu?      Excl. in [place]?     Vital signs reviewed, nursing assessments reviewed.   Constitutional:   Alert and oriented. Non-toxic appearance. Eyes:   Conjunctivae are normal. EOMI. PERRL. ENT      Head:   Normocephalic and atraumatic.      Nose: Normal      Mouth/Throat: Normal.      Neck:   No meningismus. Full ROM. Hematological/Lymphatic/Immunilogical:   No cervical lymphadenopathy. Cardiovascular:   RRR. Symmetric bilateral radial and DP pulses.  No murmurs. Cap refill less than 2 seconds. Respiratory:   Normal respiratory effort without tachypnea/retractions. Breath sounds are clear and equal bilaterally. No wheezes/rales/rhonchi. Gastrointestinal:   Soft and nontender. Non distended. There is no CVA tenderness.  No rebound, rigidity, or guarding. Musculoskeletal:   Normal range of motion in all extremities. No joint effusions.  No lower extremity tenderness.  No edema. Neurologic:   Normal speech and language.  Motor grossly intact. No acute focal neurologic deficits are appreciated.  Skin:    Skin is warm, dry and intact. No rash noted.  No petechiae, purpura, or bullae.  ____________________________________________    LABS (pertinent positives/negatives) (all labs ordered are listed, but only abnormal results are displayed) Labs Reviewed  CBC WITH DIFFERENTIAL/PLATELET - Abnormal; Notable for the following components:      Result Value   Hemoglobin 12.5 (*)    HCT 37.0 (*)    All other components within normal limits  RESP PANEL BY RT-PCR (FLU A&B, COVID) ARPGX2  COMPREHENSIVE METABOLIC PANEL  PROTIME-INR  APTT  HEPARIN LEVEL (UNFRACTIONATED)  CBC  TROPONIN I (HIGH SENSITIVITY)   ____________________________________________   EKG  Interpreted by  me Sinus rhythm rate of 99, normal axis and intervals.  Normal QRS.  ST elevation in V2 through V4, 1 and aVL consistent with anterior STEMI.  ____________________________________________    RADIOLOGY  No results found.  ____________________________________________   PROCEDURES .Critical Care Performed by: Carrie Mew, MD Authorized by: Carrie Mew, MD   Critical care provider statement:    Critical care time (minutes):  30   Critical care time was exclusive of:  Separately billable procedures and treating other patients   Critical care was necessary to treat or prevent imminent or life-threatening deterioration of the following conditions:  Cardiac failure   Critical care was time spent personally by me on the following activities:  Development of treatment  plan with patient or surrogate, discussions with consultants, evaluation of patient's response to treatment, examination of patient, obtaining history from patient or surrogate, ordering and performing treatments and interventions, ordering and review of laboratory studies, ordering and review of radiographic studies, pulse oximetry, re-evaluation of patient's condition and review of old charts    ____________________________________________    CLINICAL IMPRESSION / ASSESSMENT AND PLAN / ED COURSE  Medications ordered in the ED: Medications  heparin ADULT infusion 100 units/mL (25000 units/223mL) (has no administration in time range)  heparin injection 4,000 Units (4,000 Units Intravenous Given 06/16/20 1945)  sodium chloride 0.9 % bolus 1,000 mL (1,000 mLs Intravenous New Bag/Given 06/16/20 1959)  amiodarone (NEXTERONE) IV bolus only 150 mg/100 mL (150 mg Intravenous New Bag/Given 06/16/20 2003)    Pertinent labs & imaging results that were available during my care of the patient were reviewed by me and considered in my medical decision making (see chart for details).  Charlton Boule was evaluated in Emergency  Department on 06/16/2020 for the symptoms described in the history of present illness. He was evaluated in the context of the global COVID-19 pandemic, which necessitated consideration that the patient might be at risk for infection with the SARS-CoV-2 virus that causes COVID-19. Institutional protocols and algorithms that pertain to the evaluation of patients at risk for COVID-19 are in a state of rapid change based on information released by regulatory bodies including the CDC and federal and state organizations. These policies and algorithms were followed during the patient's care in the ED.     Clinical Course as of 06/16/20 2019  Thu Jun 16, 2020  1948 Presents with sudden onset of chest pain while lifting weights at the gym, EKG shows anterior STEMI.  Aspirin given by EMS.  Heparin 4000 units given in ED.  Vitals stable.  Patient alert.  STEMI activated by EMS, will obtain labs and Covid screening in anticipation of Cath Lab [PS]  2005 Patient having increasingly frequent PVCs.  Verbal order to nurse for amiodarone 150 mg bolus. [PS]    Clinical Course User Index [PS] Carrie Mew, MD     ----------------------------------------- 8:09 PM on 06/16/2020 -----------------------------------------  Patient off to Cath Lab with cardiology Dr. Saralyn Pilar.  ____________________________________________   FINAL CLINICAL IMPRESSION(S) / ED DIAGNOSES    Final diagnoses:  Acute ST elevation myocardial infarction (STEMI) of anterior wall [facility])     ED Discharge Orders    None      Portions of this note were generated with dragon dictation software. Dictation errors may occur despite best attempts at proofreading.   Carrie Mew, MD 06/16/20 2017

## 2020-06-16 NOTE — ED Notes (Addendum)
Dr. Nehemiah Massed at bedside

## 2020-06-16 NOTE — ED Notes (Signed)
Patient noted to have increasing PVCs, MD at bedside.Marland Kitchen amiodarone to be given

## 2020-06-16 NOTE — H&P (Addendum)
History and Physical    Matthew Kelley FBP:[id number] DOB: [birth year] DOA: 06/16/2020  PCP: No primary care provider on file.  Patient coming from: Home, wife at bedside  I have personally briefly reviewed patient's old medical records in [place]  Chief Complaint: chest pain  HPI: Matthew Kelley is a 60 y.o. male with medical history significant for history of thyroid cancer s/p thyroidectomy, OSA on CPAP, IBS, history of iron deficiency anemia, history of GI bleed with AVM and hyperlipidemia who presents with chest pain.  He was at the gym lifting earlier this evening when he developed sudden acute onset midsternal chest pain with radiation up to his neck.  Initially he thought it was due to muscle spasm from intense workout however he decided to present to ED for evaluation after persistent pain for at least 10 minutes.  He has no personal history of cardiac disease but father had cardiac disease.  In the ED, he was found to have anterior STEMI and was taken for emergent cardiac catheterization and received DES to proximal LAD.  He was noted to have frequent PVCs also started on amiodarone infusion.  Hospitalist consulted by cardiology for medical management post cath. Patient currently chest pain-free and feels well postprocedure.  Review of Systems: Constitutional: No Weight Change, No Fever ENT/Mouth: No sore throat, No Rhinorrhea Eyes: No Eye Pain, No Vision Changes Cardiovascular: + Chest Pain, no SOB Respiratory: No Cough, No Sputum  Gastrointestinal: No Nausea, No Vomiting, No Diarrhea, No Constipation, No Pain Genitourinary: no Urinary Incontinence Musculoskeletal: No Arthralgias, No Myalgias Skin: No Skin Lesions, No Pruritus, Neuro: no Weakness, No Numbness,  Psych: No Anxiety/Panic, No Depression, no decrease appetite Heme/Lymph: No Bruising, No Bleeding  Past Medical History:  Diagnosis Date  . H/O total thyroidectomy   . Hypothyroidism   . Thyroid  cancer ([place])       has no history on file for tobacco use, alcohol use, and drug use. Social History  No Known Allergies    Prior to Admission medications   Not on File    Physical Exam: Vitals:   06/16/20 2212 06/16/20 2338  BP: (!) 156/91 131/82  Pulse: 93 79  Temp: 98.2 F (36.8 C)   TempSrc: Oral   SpO2: 96%   Weight: 74.1 kg   Height: 6\' 1"  (1.854 m)     Constitutional: NAD, calm, comfortable, elderly male appearing younger than stated age sitting upright in bed eating Vitals:   06/16/20 2212 06/16/20 2338  BP: (!) 156/91 131/82  Pulse: 93 79  Temp: 98.2 F (36.8 C)   TempSrc: Oral   SpO2: 96%   Weight: 74.1 kg   Height: 6\' 1"  (1.854 m)    Eyes: PERRL, lids and conjunctivae normal ENMT: Mucous membranes are moist. Normal dentition.  Neck: normal, supple,  Respiratory: clear to auscultation bilaterally, no wheezing, no crackles. Normal respiratory effort. No accessory muscle use.  Cardiovascular: Regular rate and rhythm, no murmurs / rubs / gallops. No extremity edema.  Abdomen: no tenderness, no masses palpated.  Bowel sounds positive.  Musculoskeletal: no clubbing / cyanosis. No joint deformity upper and lower extremities. Good ROM, no contractures. Normal muscle tone.  Skin: no rashes, lesions, ulcers. No induration Neurologic: CN 2-12 grossly intact. Sensation intact,  Strength 5/5 in all 4.  Psychiatric: Normal judgment and insight. Alert and oriented x 3. Normal mood.     Labs on Admission: I have personally reviewed following labs and imaging studies  CBC:  No results for input(s): WBC, NEUTROABS, HGB, HCT, MCV, PLT in the last 168 hours. Basic Metabolic Panel: No results for input(s): NA, K, CL, CO2, GLUCOSE, BUN, CREATININE, CALCIUM, MG, PHOS in the last 168 hours. GFR: CrCl cannot be calculated (No successful lab value found.). Liver Function Tests: No results for input(s): AST, ALT, ALKPHOS, BILITOT, PROT, ALBUMIN in the last 168  hours. No results for input(s): LIPASE, AMYLASE in the last 168 hours. No results for input(s): AMMONIA in the last 168 hours. Coagulation Profile: No results for input(s): INR, PROTIME in the last 168 hours. Cardiac Enzymes: No results for input(s): CKTOTAL, CKMB, CKMBINDEX, TROPONINI in the last 168 hours. BNP (last 3 results) No results for input(s): PROBNP in the last 8760 hours. HbA1C: No results for input(s): HGBA1C in the last 72 hours. CBG: No results for input(s): GLUCAP in the last 168 hours. Lipid Profile: No results for input(s): CHOL, HDL, LDLCALC, TRIG, CHOLHDL, LDLDIRECT in the last 72 hours. Thyroid Function Tests: No results for input(s): TSH, T4TOTAL, FREET4, T3FREE, THYROIDAB in the last 72 hours. Anemia Panel: No results for input(s): VITAMINB12, FOLATE, FERRITIN, TIBC, IRON, RETICCTPCT in the last 72 hours. Urine analysis: No results found for: COLORURINE, APPEARANCEUR, LABSPEC, [place], GLUCOSEU, HGBUR, BILIRUBINUR, KETONESUR, PROTEINUR, UROBILINOGEN, NITRITE, LEUKOCYTESUR  Radiological Exams on Admission: CARDIAC CATHETERIZATION  Result Date: 06/16/2020  2nd Mrg lesion is 40% stenosed.  Prox LAD lesion is 99% stenosed.  A drug-eluting stent was successfully placed using a STENT RESOLUTE ONYX 3.0X26.  Post intervention, there is a 0% residual stenosis.  Mid LAD lesion is 50% stenosed.  Prox RCA lesion is 30% stenosed.  1.  Anterior ST elevation myocardial infarction 2.  One-vessel coronary artery disease with ulcerative 99% stenosis proximal LAD 3.  Mild use left ventricular function with apical wall hypokinesis 4.  Successful primary PCI with 3.0 x 26 mm resolute Onyx drug-eluting stent proximal LAD Recommendations 1.  Dual antiplatelet therapy uninterrupted for 1 year 2.  Uptitrate atorvastatin 80 mg daily 3.  Add metoprolol tartrate 25 mg twice daily 4.  2D echocardiogram     Assessment/Plan  Anterior lateral STEMI S/p PCI with DES to proximal  LAD Management per cardiology Continue DAPT for a year Atorvastatin 80 mg Addition of metoprolol tartrate 25 mg BID Echo pending   Elevated creatinine Creatinine of 1.25 from prior of 1.20 on admission Monitor with repeat labs in the morning On IV continuous fluid  OSA Continue CPAP  Hx of thyroid cancer s/p thyroidectomy Continue levothyroxine  HLD Atorvastatin increase to 80mg    DVT prophylaxis: SCDs Code Status: Full Family Communication: Plan discussed with patient at bedside  disposition Plan: Home with at least 2 midnight stays  Consults called: cardiology Admission status: inpatient     level of care: Stepdown  Status is: Inpatient  Remains inpatient appropriate because:Inpatient level of care appropriate due to severity of illness   Dispo: The patient is from: Home              Anticipated d/c is to: Home              Patient currently is not medically stable to d/c.   Difficult to place patient No         Orene Desanctis DO [facility]   If 7PM-7AM, please contact night-coverage [email or web address]   06/16/2020, 11:53 PM

## 2020-06-16 NOTE — Progress Notes (Signed)
   06/16/20 1937  Clinical Encounter Type  Visited With Patient  Visit Type Initial  Referral From Nurse  Consult/Referral To Chaplain  Spiritual Encounters  Spiritual Needs Prayer;Emotional  Chaplain Jessamy Torosyan responded to a Code Stemi in ED-14. Pt arrived via [facility] with the complaint of chest pains. The medical team working on Pt closed the door. No family members were present at the time. I provided ministry of presence.

## 2020-06-16 NOTE — ED Notes (Signed)
Dr. Paraschos at bedside. 

## 2020-06-16 NOTE — Consult Note (Signed)
[facility] Cardiology  CARDIOLOGY CONSULT NOTE  Patient ID: Matthew Kelley MRN: [id number] DOB/AGE: 62-May-1962 62 60 y.o.  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  The patient has a history of hyperlipidemia, on atorvastatin.  He was in his usual state of health until earlier this evening when he developed severe substernal chest pain.  The patient was [facility] ED via EMS.  EKG revealed ST elevations in the anterolateral leads consistent with anterolateral ST elevation myocardial infarction.  Review of systems complete and found to be negative unless listed above     No past medical history on file.   No medications prior to admission.   Social History   Socioeconomic History   Marital status: Not on file    Spouse name: Not on file   Number of children: Not on file   Years of education: Not on file   Highest education level: Not on file  Occupational History   Not on file  Tobacco Use   Smoking status: Not on file   Smokeless tobacco: Not on file  Substance and Sexual Activity   Alcohol use: Not on file   Drug use: Not on file   Sexual activity: Not on file  Other Topics Concern   Not on file  Social History Narrative   Not on file   Social Determinants of Health   Financial Resource Strain: Not on file  Food Insecurity: Not on file  Transportation Needs: Not on file  Physical Activity: Not on file  Stress: Not on file  Social Connections: Not on file  Intimate Partner Violence: Not on file    No family history on file.    Review of systems complete and found to be negative unless listed above      PHYSICAL EXAM  General: Well developed, well nourished, in no acute distress HEENT:  Normocephalic and atramatic Neck:  No JVD.  Lungs: Clear bilaterally to auscultation  and percussion. Heart: HRRR . Normal S1 and S2 without gallops or murmurs.  Abdomen: Bowel sounds are positive, abdomen soft and non-tender  Msk:  Back normal, normal gait. Normal strength and tone for age. Extremities: No clubbing, cyanosis or edema.   Neuro: Alert and oriented X 3. Psych:  Good affect, responds appropriately  Labs:  No results found for: WBC, HGB, HCT, MCV, PLT No results for input(s): NA, K, CL, CO2, BUN, CREATININE, CALCIUM, PROT, BILITOT, ALKPHOS, ALT, AST, GLUCOSE in the last 168 hours.  Invalid input(s): LABALBU No results found for: CKTOTAL, CKMB, CKMBINDEX, TROPONINI No results found for: CHOL No results found for: HDL No results found for: LDLCALC No results found for: TRIG No results found for: CHOLHDL No results found for: LDLDIRECT    Radiology: CARDIAC CATHETERIZATION  Result Date: 06/16/2020  2nd Mrg lesion is 40% stenosed.  Prox LAD lesion is 99% stenosed.  A drug-eluting stent was successfully placed using a STENT RESOLUTE ONYX 3.0X26.  Post intervention, there is a 0% residual stenosis.  Mid LAD lesion is 50% stenosed.  Prox RCA lesion is 30% stenosed.  1.  Anterior ST elevation myocardial infarction 2.  One-vessel coronary artery disease with ulcerative 99% stenosis proximal LAD 3.  Mild use left ventricular function with apical wall hypokinesis 4.  Successful primary PCI with 3.0 x 26 mm resolute Onyx drug-eluting stent proximal LAD Recommendations 1.  Dual antiplatelet therapy uninterrupted for 1  year 2.  Uptitrate atorvastatin 80 mg daily 3.  Add metoprolol tartrate 25 mg twice daily 4.  2D echocardiogram   EKG: Sinus rhythm with ST elevations in leads V2 through V6, I and aVL  ASSESSMENT AND PLAN:   1.  Anterior ST elevation myocardial infarction 2.  Hyperlipidemia  Recommendations  1.  Emergent cardiac catheterization and probable primary PCI  Signed: [name] MD,PhD, [facility] 06/16/2020, 9:39 PM

## 2020-06-16 NOTE — Progress Notes (Addendum)
[facility] for heparin Indication: chest pain/ACS  No Known Allergies  Patient Measurements: Weight: 87.5 kg (193 lb) Heparin Dosing Weight: 87 kg  Vital Signs: Temp: 97.7 F (36.5 C) (03/10 1945) Temp Source: Oral (03/10 1945) BP: 158/128 (03/10 1951) Pulse Rate: 96 (03/10 1951)  Labs: No results for input(s): HGB, HCT, PLT, APTT, LABPROT, INR, HEPARINUNFRC, HEPRLOWMOCWT, CREATININE, CKTOTAL, CKMB, TROPONINIHS in the last 72 hours.  CrCl cannot be calculated (Patient's most recent lab result is older than the maximum 21 days allowed.).   Medical History: Past Medical History:  Diagnosis Date  . Blood in stool    h/o  . Cancer [facility])    thyroid cancer  . History of chicken pox   . Hypercholesterolemia   . Sleep apnea   . Vertigo      Assessment: 60 year old male presented as Code STEMI. No anticoagulation PTA per outside med report. Pharmacy consult for heparin drip.  Goal of Therapy:  Heparin level 0.3-0.7 units/ml Monitor platelets by anticoagulation protocol: Yes   Plan:  Heparin 4000 units IV given upon arrival to ED. Will follow with heparin infusion at 1100 units/hr. Check HL 3/11 at 0300. CBC daily while on heparin drip.  Tawnya Crook, PharmD 06/16/2020,7:56 PM

## 2020-06-16 NOTE — ED Triage Notes (Incomplete)
EMS reports patient was on treadmill when he started having chest pressure.   EMS gave ASA 324mg , Fentanyl 163mcg and Nitro paste in route.

## 2020-06-17 ENCOUNTER — Encounter: Payer: Self-pay | Admitting: Family Medicine

## 2020-06-17 ENCOUNTER — Inpatient Hospital Stay
Admit: 2020-06-17 | Discharge: 2020-06-17 | Disposition: A | Discharge status: Home / Self Care | Payer: 59 | Attending: Cardiology | Admitting: Cardiology

## 2020-06-17 ENCOUNTER — Other Ambulatory Visit: Payer: Self-pay

## 2020-06-17 DIAGNOSIS — E785 Hyperlipidemia, unspecified: Secondary | ICD-10-CM

## 2020-06-17 DIAGNOSIS — E890 Postprocedural hypothyroidism: Secondary | ICD-10-CM

## 2020-06-17 DIAGNOSIS — G4733 Obstructive sleep apnea (adult) (pediatric): Secondary | ICD-10-CM

## 2020-06-17 DIAGNOSIS — R7989 Other specified abnormal findings of blood chemistry: Secondary | ICD-10-CM

## 2020-06-17 DIAGNOSIS — I2102 ST elevation (STEMI) myocardial infarction involving left anterior descending coronary artery: Secondary | ICD-10-CM

## 2020-06-17 LAB — BASIC METABOLIC PANEL
Anion gap: 8 (ref 5–15)
BUN: 17 mg/dL (ref 8–23)
CO2: 24 mmol/L (ref 22–32)
Calcium: 8.4 mg/dL — ABNORMAL LOW (ref 8.9–10.3)
Chloride: 105 mmol/L (ref 98–111)
Creatinine, Ser: 1.02 mg/dL (ref 0.61–1.24)
GFR, Estimated: >60 mL/min (ref >60)
Glucose, Bld: 133 mg/dL — ABNORMAL HIGH (ref 70–99)
Potassium: 3.8 mmol/L (ref 3.5–5.1)
Sodium: 137 mmol/L (ref 135–145)

## 2020-06-17 LAB — TROPONIN I (HIGH SENSITIVITY)
Troponin I (High Sensitivity): >27000 ng/L (ref <18)
Troponin I (High Sensitivity): 15024 ng/L (ref <18)

## 2020-06-17 LAB — CBC
HCT: 32.9 % — ABNORMAL LOW (ref 39.0–52.0)
Hemoglobin: 11.3 g/dL — ABNORMAL LOW (ref 13.0–17.0)
MCH: 28.5 pg (ref 26.0–34.0)
MCHC: 34.3 g/dL (ref 30.0–36.0)
MCV: 83.1 fL (ref 80.0–100.0)
Platelets: 198 10*3/uL (ref 150–400)
RBC: 3.96 MIL/uL — ABNORMAL LOW (ref 4.22–5.81)
RDW: 14.9 % (ref 11.5–15.5)
WBC: 7.2 10*3/uL (ref 4.0–10.5)
nRBC: 0 % (ref 0.0–0.2)

## 2020-06-17 LAB — ECHOCARDIOGRAM COMPLETE
AR max vel: 2.73 cm2
AV Area VTI: 3.12 cm2
AV Area mean vel: 2.45 cm2
AV Mean grad: 2 mmHg
AV Peak grad: 3.9 mmHg
Ao pk vel: 0.98 m/s
Area-P 1/2: 3.39 cm2
Calc EF: 45.3 %
Height: 73 in
S' Lateral: 3.83 cm
Single Plane A2C EF: 50.1 %
Single Plane A4C EF: 40.2 %
Weight: 2613.77 oz

## 2020-06-17 LAB — MRSA PCR SCREENING: MRSA by PCR: NEGATIVE

## 2020-06-17 LAB — CBG MONITORING, ED: Glucose-Capillary: 114 mg/dL — ABNORMAL HIGH (ref 70–99)

## 2020-06-17 MED ORDER — VITAMIN D 25 MCG (1000 UNIT) PO TABS
1000.0000 [IU] | ORAL_TABLET | Freq: Every day | ORAL | Status: DC
  Administered 2020-06-17 – 2020-06-18 (×2): 1000 [IU] via ORAL
  Filled 2020-06-17 (×2): qty 1

## 2020-06-17 MED ORDER — OMEPRAZOLE 20 MG PO CPDR
40.0000 mg | DELAYED_RELEASE_CAPSULE | Freq: Every day | ORAL | Status: DC
  Administered 2020-06-17 – 2020-06-18 (×2): 40 mg via ORAL
  Filled 2020-06-17 (×2): qty 2

## 2020-06-17 MED ORDER — VALACYCLOVIR HCL 500 MG PO TABS
1000.0000 mg | ORAL_TABLET | Freq: Two times a day (BID) | ORAL | Status: DC
  Administered 2020-06-17 – 2020-06-18 (×4): 1000 mg via ORAL
  Filled 2020-06-17 (×5): qty 2

## 2020-06-17 MED ORDER — CYCLOSPORINE 0.05 % OP EMUL
1.0000 [drp] | Freq: Two times a day (BID) | OPHTHALMIC | Status: DC
  Administered 2020-06-17 – 2020-06-18 (×3): 1 [drp] via OPHTHALMIC
  Filled 2020-06-17 (×4): qty 1

## 2020-06-17 MED ORDER — LISINOPRIL 5 MG PO TABS
5.0000 mg | ORAL_TABLET | Freq: Every day | ORAL | Status: DC
  Administered 2020-06-17 – 2020-06-18 (×2): 5 mg via ORAL
  Filled 2020-06-17 (×2): qty 1

## 2020-06-17 MED ORDER — OCUVITE-LUTEIN PO CAPS
1.0000 | ORAL_CAPSULE | Freq: Every day | ORAL | Status: DC
  Administered 2020-06-17 – 2020-06-18 (×2): 1 via ORAL
  Filled 2020-06-17 (×2): qty 1

## 2020-06-17 MED ORDER — VALACYCLOVIR HCL 500 MG PO TABS: 1000.0000 mg | ORAL_TABLET | Freq: Three times a day (TID) | ORAL | Status: DC

## 2020-06-17 NOTE — Progress Notes (Signed)
Medication Reconciliation was completed on other chart for patient. Spoke with spouse of pt who was able to verify the medications and last doses.

## 2020-06-17 NOTE — Progress Notes (Signed)
*  PRELIMINARY RESULTS* Echocardiogram 2D Echocardiogram has been performed.  Matthew Kelley 06/17/2020, 8:57 AM

## 2020-06-17 NOTE — Progress Notes (Signed)
Spoke with Dr Ubaldo Glassing regarding elevated troponin levels. At this time no new orders. Per Dr Ubaldo Glassing give 25 mg of metoprolol, heart rate currently 68. Continue to monitor.

## 2020-06-17 NOTE — Progress Notes (Signed)
Several attempts were made after the two hour mark to release the air out of the band to the right wrist. After each attempt, bleeding was noted and the air was returned to the cuff. Another attempt to remove the cuff was made at [facility] removed at a time with no bleeding noted until the cuff was deflated. Cuff was removed at 0630. Area is void of bleeding and swelling. Capillary refill is less than 3 seconds. No numbness or tingling in the right upper extremity. Denies pain. S1S2 noted. Sinus rhythm to sinus brady noted on the monitor. Pulses strong in all extremities. Amiodarone drip was stopped at 0453. Minimal ectopy noted. Patient has done well overnight. VSS. Call bell in reach. Will continue to monitor.

## 2020-06-17 NOTE — Progress Notes (Signed)
[facility] [place] NAME: Matthew Kelley    MR#:  [id number]  DATE OF BIRTH:  Jan 12, 1961  SUBJECTIVE:  came in with chest pain last evening after workout at the gym. Underwent emergent cardiac cath for acute MI. Denies any chest pain or shortness of breath. Ate good breakfast.  REVIEW OF SYSTEMS:   Review of Systems  Constitutional: Negative for chills, fever and weight loss.  HENT: Negative for ear discharge, ear pain and nosebleeds.   Eyes: Negative for blurred vision, pain and discharge.  Respiratory: Negative for sputum production, shortness of breath, wheezing and stridor.   Cardiovascular: Negative for chest pain, palpitations, orthopnea and PND.  Gastrointestinal: Negative for abdominal pain, diarrhea, nausea and vomiting.  Genitourinary: Negative for frequency and urgency.  Musculoskeletal: Negative for back pain and joint pain.  Neurological: Negative for sensory change, speech change, focal weakness and weakness.  Psychiatric/Behavioral: Negative for depression and hallucinations. The patient is not nervous/anxious.    Tolerating Diet:yesTolerating PT: self ambulatory  DRUG ALLERGIES:  No Known Allergies  VITALS:  Blood pressure 121/87, pulse 79, temperature 98.5 F (36.9 C), temperature source Oral, resp. rate 15, height 6\' 1"  (1.854 m), weight 74.1 kg, SpO2 100 %.  PHYSICAL EXAMINATION:   Physical Exam  GENERAL:  60 y.o.-year-old patient lying in the bed with no acute distress.  LUNGS: Normal breath sounds bilaterally, no wheezing, rales, rhonchi. No use of accessory muscles of respiration.  CARDIOVASCULAR: S1, S2 normal. No murmurs, rubs, or gallops.  ABDOMEN: Soft, nontender, nondistended. Bowel sounds present. No organomegaly or mass.  EXTREMITIES: No cyanosis, clubbing or edema b/l.    NEUROLOGIC: Cranial nerves II through XII are intact. No focal Motor or sensory deficits b/l.   PSYCHIATRIC:  patient is alert and  oriented x 3.  SKIN: No obvious rash, lesion, or ulcer.   LABORATORY PANEL:  CBC Recent Labs  Lab 06/17/20 0602  WBC 7.2  HGB 11.3*  HCT 32.9*  PLT 198    Chemistries  Recent Labs  Lab 06/17/20 0602  NA 137  K 3.8  CL 105  CO2 24  GLUCOSE 133*  BUN 17  CREATININE 1.02  CALCIUM 8.4*   Cardiac Enzymes No results for input(s): TROPONINI in the last 168 hours. RADIOLOGY:  CARDIAC CATHETERIZATION  Result Date: 06/16/2020  2nd Mrg lesion is 40% stenosed.  Prox LAD lesion is 99% stenosed.  A drug-eluting stent was successfully placed using a STENT RESOLUTE ONYX 3.0X26.  Post intervention, there is a 0% residual stenosis.  Mid LAD lesion is 50% stenosed.  Prox RCA lesion is 30% stenosed.  1.  Anterior ST elevation myocardial infarction 2.  One-vessel coronary artery disease with ulcerative 99% stenosis proximal LAD 3.  Mild use left ventricular function with apical wall hypokinesis 4.  Successful primary PCI with 3.0 x 26 mm resolute Onyx drug-eluting stent proximal LAD Recommendations 1.  Dual antiplatelet therapy uninterrupted for 1 year 2.  Uptitrate atorvastatin 80 mg daily 3.  Add metoprolol tartrate 25 mg twice daily 4.  2D echocardiogram  ECHOCARDIOGRAM COMPLETE  Result Date: 06/17/2020    ECHOCARDIOGRAM REPORT   Patient Name:   Matthew Kelley Date of Exam: 06/17/2020 Medical Rec #:  [id number]        Height:       73.0 in Accession #:    [id number]       Weight:       163.4 lb Date of Birth:  Sep 11, 1960         BSA:          1.974 m Patient Age:    76 years         BP:           116/70 mmHg Patient Gender: M                HR:           63 bpm. Exam Location:  [facility] Procedure: 2D Echo, Cardiac Doppler and Color Doppler Indications:     Acute myocardial infarction I21.9  History:         Patient has no prior history of Echocardiogram examinations.                  Hypothyroidism.  Sonographer:     Sherrie Sport RDCS (AE) Referring Phys:  [place] Diagnosing Phys:  Bartholome Bill MD IMPRESSIONS  1. Left ventricular ejection fraction, by estimation, is 40 to 45%. The left ventricle has normal function. The left ventricle demonstrates regional wall motion abnormalities (see scoring diagram/findings for description). Left ventricular diastolic parameters were normal.  2. Right ventricular systolic function is normal. The right ventricular size is normal.  3. The mitral valve is grossly normal. Trivial mitral valve regurgitation.  4. The aortic valve is grossly normal. Aortic valve regurgitation is not visualized. FINDINGS  Left Ventricle: Left ventricular ejection fraction, by estimation, is 40 to 45%. The left ventricle has normal function. The left ventricle demonstrates regional wall motion abnormalities. The left ventricular internal cavity size was normal in size. There is no left ventricular hypertrophy. Left ventricular diastolic parameters were normal.  LV Wall Scoring: The mid anteroseptal segment is akinetic. The basal anteroseptal segment, mid inferolateral segment, basal anterior segment, and basal inferoseptal segment are hypokinetic. The basal inferolateral segment, basal anterolateral segment, and basal inferior segment are normal. Right Ventricle: The right ventricular size is normal. No increase in right ventricular wall thickness. Right ventricular systolic function is normal. Left Atrium: Left atrial size was normal in size. Right Atrium: Right atrial size was normal in size. Pericardium: There is no evidence of pericardial effusion. Mitral Valve: The mitral valve is grossly normal. Trivial mitral valve regurgitation. Tricuspid Valve: The tricuspid valve is not well visualized. Tricuspid valve regurgitation is trivial. Aortic Valve: The aortic valve is grossly normal. Aortic valve regurgitation is not visualized. Aortic valve mean gradient measures 2.0 mmHg. Aortic valve peak gradient measures 3.9 mmHg. Aortic valve area, by VTI measures 3.12 cm. Pulmonic Valve:  The pulmonic valve was not well visualized. Pulmonic valve regurgitation is not visualized. Aorta: The aortic root is normal in size and structure. IAS/Shunts: The interatrial septum was not assessed.  LEFT VENTRICLE PLAX 2D LVIDd:         4.99 cm      Diastology LVIDs:         3.83 cm      LV e' medial:    5.77 cm/s LV PW:         0.66 cm      LV E/e' medial:  8.6 LV IVS:        0.75 cm      LV e' lateral:   4.90 cm/s LVOT diam:     2.00 cm      LV E/e' lateral: 10.1 LV SV:         55 LV SV Index:   28 LVOT Area:     3.14  cm  LV Volumes (MOD) LV vol d, MOD A2C: 69.9 ml LV vol d, MOD A4C: 103.0 ml LV vol s, MOD A2C: 34.9 ml LV vol s, MOD A4C: 61.6 ml LV SV MOD A2C:     35.0 ml LV SV MOD A4C:     103.0 ml LV SV MOD BP:      38.8 ml RIGHT VENTRICLE RV Basal diam:  2.76 cm RV S prime:     13.70 cm/s TAPSE (M-mode): 4.4 cm LEFT ATRIUM           Index       RIGHT ATRIUM           Index LA diam:      3.00 cm 1.52 cm/m  RA Area:     15.00 cm LA Vol (A2C): 47.1 ml 23.86 ml/m RA Volume:   38.30 ml  19.40 ml/m LA Vol (A4C): 15.3 ml 7.75 ml/m  AORTIC VALVE                   PULMONIC VALVE AV Area (Vmax):    2.73 cm    PV Vmax:        0.37 m/s AV Area (Vmean):   2.45 cm    PV Peak grad:   0.6 mmHg AV Area (VTI):     3.12 cm    RVOT Peak grad: 2 mmHg AV Vmax:           98.30 cm/s AV Vmean:          73.350 cm/s AV VTI:            0.178 m AV Peak Grad:      3.9 mmHg AV Mean Grad:      2.0 mmHg LVOT Vmax:         85.50 cm/s LVOT Vmean:        57.100 cm/s LVOT VTI:          0.176 m LVOT/AV VTI ratio: 0.99  AORTA Ao Root diam: 3.63 cm MITRAL VALVE               TRICUSPID VALVE MV Area (PHT): 3.39 cm    TR Peak grad:   18.0 mmHg MV Decel Time: 224 msec    TR Vmax:        212.00 cm/s MV E velocity: 49.70 cm/s MV A velocity: 74.60 cm/s  SHUNTS MV E/A ratio:  0.67        Systemic VTI:  0.18 m                            Systemic Diam: 2.00 cm Bartholome Bill MD Electronically signed by Bartholome Bill MD Signature Date/Time:  06/17/2020/1:18:14 PM    Final    ASSESSMENT AND PLAN:  Matthew Kelley is a 60 y.o. male with medical history significant for history of thyroid cancer s/p thyroidectomy, OSA on CPAP, IBS, history of iron deficiency anemia, history of GI bleed with AVM and hyperlipidemia who presents with chest pain. He was at the gym lifting earlier this evening when he developed sudden acute onset midsternal chest pain with radiation up to his neck.  Acute Anterior- lateral STEMI --S/p PCI with DES to proximal LAD by Dr Josefa Half on 3/10 --Management per cardiology Dr Ubaldo Glassing --Continue DAPT (ASA +Brilinta) for a year --Atorvastatin 80 mg -- metoprolol tartrate 25 mg BID, lisinopril --Echo EF 40-45%with WMA of LV -- hemodynamically stable --troponin >27K  Elevated creatinine --Creatinine of 1.25  on admission-->1.0 --Monitor with repeat labs in the morning --On IV continuous fluid  OSA Continue CPAP  Hx of thyroid cancer s/p thyroidectomy --Continue levothyroxine  HLD --Atorvastatin increased to 80mg    DVT prophylaxis: SCDs Code Status: Full Family Communication: Plan discussed with patient at bedside  disposition Plan: Home with at least 2 midnight stays  Consults called: cardiology Dr Fath/Parachos Level of care: Stepdown  Status is: Inpatient  Remains inpatient appropriate because:Inpatient level of care appropriate due to severity of illness   Dispo: The patient is from: Home  Anticipated d/c is to: Home  Patient currently is not medically stable to d/c.              Difficult to place patient No cardiology recommends ICU monitoring in step down for one more night.    TOTAL TIME TAKING CARE OF THIS PATIENT: *40* minutes.  >50% time spent on counselling and coordination of care  Note: This dictation was prepared with Dragon dictation along with smaller phrase technology. Any transcriptional errors that result from this process are  unintentional.  Fritzi Mandes M.D    [facility]   CC: Primary care physician; Einar Pheasant, MDPatient ID: Ralene Bathe, male   DOB: February 06, 1961, 60 y.o.   MRN: [id number]

## 2020-06-17 NOTE — Progress Notes (Signed)
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  The patient has a history of hyperlipidemia, on atorvastatin.  He was in his usual state of health until earlier this evening when he developed severe substernal chest pain.  The patient was brought to [facility] ED via EMS.  EKG revealed ST elevations in the anterolateral leads consistent with anterolateral ST elevation myocardial infarction.  Status post PCI of the LAD with placement of a resolute Onyx 3.0 x 26 mm drug-eluting stent in the proximal LAD which was 99% stenosis.  Second marginal had a 40% stenosis, mid LAD had a 50% stenosis with a proximal RCA lesion of 30%.  Patient will remain on dual antiplatelet therapy for 1 year.  We will also remain on beta-blocker, afterload reduction with ACE inhibitor and high intensity statin and 80 mg daily.  Phase 2 cardiac rehab will be necessary after discharge.  Subjective   No chest pain at present.  Hemodynamically stable.  Inpatient Medications    . aspirin  81 mg Oral Daily  . atorvastatin  80 mg Oral Daily  . Chlorhexidine Gluconate Cloth  6 each Topical Daily  . levothyroxine  175 mcg Oral Q0600  . lisinopril  5 mg Oral Daily  . mouth rinse  15 mL Mouth Rinse BID  . metoprolol tartrate  25 mg Oral BID  . omeprazole  40 mg Oral Daily  . sodium chloride flush  3 mL Intravenous Q12H  . ticagrelor  90 mg Oral BID  . valACYclovir  1,000 mg Oral BID    Vital Signs    Vitals:   06/17/20 0400 06/17/20 0500 06/17/20 0600 06/17/20 0700  BP: 122/82  114/81 116/70 120/73  Pulse: 67 (!) 59 63 61  Resp: 17 16 12 15   Temp:      TempSrc:      SpO2: 100% 98% 100% 100%  Weight:      Height:        Intake/Output Summary (Last 24 hours) at 06/17/2020 0946 Last data filed at 06/17/2020 3419 Gross per 24 hour  Intake --  Output 2150 ml  Net -2150 ml   Filed Weights   06/16/20 2212  Weight: 74.1 kg    Physical Exam    GEN: Well nourished, well developed, in no acute distress.  HEENT: normal.  Neck: Supple, no JVD, carotid bruits, or masses. Cardiac: RRR, no murmurs, rubs, or gallops. No clubbing, cyanosis, edema.  Radials/DP/PT 2+ and equal bilaterally.  Respiratory:  Respirations regular and unlabored, clear to auscultation bilaterally. GI: Soft, nontender, nondistended, BS + x 4. MS: no deformity or atrophy. Skin: warm and dry, no rash. Neuro:  Strength and sensation are intact. Psych: Normal affect.  Labs    CBC Recent Labs    06/17/20 0602  WBC 7.2  HGB 11.3*  HCT 32.9*  MCV 83.1  PLT 622   Basic Metabolic Panel Recent Labs    06/17/20 0602  NA 137  K 3.8  CL 105  CO2 24  GLUCOSE 133*  BUN 17  CREATININE 1.02  CALCIUM 8.4*  Liver Function Tests No results for input(s): AST, ALT, ALKPHOS, BILITOT, PROT, ALBUMIN in the last 72 hours. No results for input(s): LIPASE, AMYLASE in the last 72 hours. Cardiac Enzymes No results for input(s): CKTOTAL, CKMB, CKMBINDEX, TROPONINI in the last 72 hours. BNP No results for input(s): BNP in the last 72 hours. D-Dimer No results for input(s): DDIMER in the last 72 hours. Hemoglobin A1C No results for input(s): HGBA1C in the last 72 hours. Fasting Lipid Panel No results for input(s): CHOL, HDL, LDLCALC, TRIG, CHOLHDL, LDLDIRECT in the last 72 hours. Thyroid Function Tests No results for input(s): TSH, T4TOTAL, T3FREE, THYROIDAB in the last 72 hours.  Invalid input(s): FREET3  Telemetry    Normal sinus rhythm  ECG    Initial electrocardiogram showed  normal sinus rhythm with acute injury current in the anterior leads  Radiology    CARDIAC CATHETERIZATION  Result Date: 06/16/2020  2nd Mrg lesion is 40% stenosed.  Prox LAD lesion is 99% stenosed.  A drug-eluting stent was successfully placed using a STENT RESOLUTE ONYX 3.0X26.  Post intervention, there is a 0% residual stenosis.  Mid LAD lesion is 50% stenosed.  Prox RCA lesion is 30% stenosed.  1.  Anterior ST elevation myocardial infarction 2.  One-vessel coronary artery disease with ulcerative 99% stenosis proximal LAD 3.  Mild use left ventricular function with apical wall hypokinesis 4.  Successful primary PCI with 3.0 x 26 mm resolute Onyx drug-eluting stent proximal LAD Recommendations 1.  Dual antiplatelet therapy uninterrupted for 1 year 2.  Uptitrate atorvastatin 80 mg daily 3.  Add metoprolol tartrate 25 mg twice daily 4.  2D echocardiogram   Assessment & Plan    60 year old patient with no prior cardiac history who developed chest pain secondary to acute anterior myocardial infarction with a 99% proximal LAD lesion.  Had antegrade flow.  Underwent successful PCI with a 3.0 x 26 mm resolute Onyx stent.  Doing well post PCI.  1.  Coronary disease-status post anterior myocardial infarction.  Status post direct PCI of the LAD with no residual lesion.  Echocardiogram showed an EF of approximately 45% with anteroapical hypokinesis.  We will continue with dual antiplatelet therapy including enteric-coated aspirin 81 mg daily and ticagrelor 90 mg twice daily.  We will also place on lisinopril 5 mg daily and continue metoprolol tartrate 25 mg twice daily.  Will increase his Lipitor from 20 mg which she was on as an outpatient to 80 mg daily.  We will arrange for phase 2 cardiac rehab post discharge.  Will monitor for next 24 hours and if stable in the morning plan discharge.  Signed, Javier Docker Shawne Bulow MD 06/17/2020, 9:46 AM  Pager: [phone]

## 2020-06-18 ENCOUNTER — Encounter: Payer: Self-pay | Admitting: Family Medicine

## 2020-06-18 ENCOUNTER — Other Ambulatory Visit: Payer: Self-pay

## 2020-06-18 DIAGNOSIS — E785 Hyperlipidemia, unspecified: Secondary | ICD-10-CM

## 2020-06-18 DIAGNOSIS — E890 Postprocedural hypothyroidism: Secondary | ICD-10-CM

## 2020-06-18 DIAGNOSIS — G4733 Obstructive sleep apnea (adult) (pediatric): Secondary | ICD-10-CM

## 2020-06-18 LAB — MAGNESIUM: Magnesium: 2.3 mg/dL (ref 1.7–2.4)

## 2020-06-18 LAB — BASIC METABOLIC PANEL
Anion gap: 6 (ref 5–15)
BUN: 20 mg/dL (ref 8–23)
CO2: 24 mmol/L (ref 22–32)
Calcium: 8.5 mg/dL — ABNORMAL LOW (ref 8.9–10.3)
Chloride: 108 mmol/L (ref 98–111)
Creatinine, Ser: 1.14 mg/dL (ref 0.61–1.24)
GFR, Estimated: >60 mL/min (ref >60)
Glucose, Bld: 104 mg/dL — ABNORMAL HIGH (ref 70–99)
Potassium: 3.9 mmol/L (ref 3.5–5.1)
Sodium: 138 mmol/L (ref 135–145)

## 2020-06-18 MED ORDER — TICAGRELOR 90 MG PO TABS: 90.0000 mg | ORAL_TABLET | Freq: Two times a day (BID) | ORAL | 1 refills | Status: DC

## 2020-06-18 MED ORDER — ATORVASTATIN CALCIUM 80 MG PO TABS: 80.0000 mg | ORAL_TABLET | Freq: Every day | ORAL | 1 refills | Status: DC

## 2020-06-18 MED ORDER — METOPROLOL TARTRATE 25 MG PO TABS: 25.0000 mg | ORAL_TABLET | Freq: Two times a day (BID) | ORAL | 1 refills | Status: DC

## 2020-06-18 MED ORDER — LISINOPRIL 5 MG PO TABS: 5.0000 mg | ORAL_TABLET | Freq: Every day | ORAL | 1 refills | Status: DC

## 2020-06-18 MED ORDER — ASPIRIN 81 MG PO CHEW
81.0000 mg | CHEWABLE_TABLET | Freq: Every day | ORAL | 2 refills | Status: AC
Start: 2020-06-18 — End: ?

## 2020-06-18 NOTE — Plan of Care (Signed)
Pt discharged to home, radial access site clean and dry, mild bruising, pulse and sensation WDL, NSR w./occasional PVCs, pt ambulatory in room, appetite at baseline, no SOB or CP, discharged home with his wife, will follow up with cardiology as outpatient

## 2020-06-18 NOTE — Progress Notes (Signed)
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 The patient has a history of hyperlipidemia, on atorvastatin. He was in his usual state of health until earlier this evening when he developed severe substernal chest pain. The patient was brought to [facility] ED via EMS. EKG revealed ST elevations in the anterolateral leads consistent with anterolateral ST elevation myocardial infarction.  Status post PCI of the LAD with placement of a resolute Onyx 3.0 x 26 mm drug-eluting stent in the proximal LAD which was 99% stenosis.  Second marginal had a 40% stenosis, mid LAD had a 50% stenosis with a proximal RCA lesion of 30%.  Patient will remain on dual antiplatelet therapy for 1 year.  We will also remain on beta-blocker, afterload reduction with ACE inhibitor and high intensity statin and 80 mg daily.  Phase 2 cardiac rehab will be necessary after discharge.  Subjective   Doing well this morning with no chest pain or shortness of breath.  States she slept well.  Right radial cath site clean and dry.  Inpatient Medications    . aspirin  81 mg Oral Daily  . atorvastatin  80 mg Oral Daily  . Chlorhexidine Gluconate Cloth  6 each Topical Daily  . cholecalciferol  1,000 Units Oral Daily  . cycloSPORINE  1 drop Both Eyes BID  . levothyroxine  175 mcg Oral Q0600  . lisinopril  5 mg Oral Daily  . mouth rinse  15 mL Mouth Rinse BID  . metoprolol tartrate  25 mg Oral BID  . multivitamin-lutein  1 capsule Oral Daily  . omeprazole  40 mg Oral Daily  . sodium  chloride flush  3 mL Intravenous Q12H  . ticagrelor  90 mg Oral BID  . valACYclovir  1,000 mg Oral BID    Vital Signs    Vitals:   06/18/20 0300 06/18/20 0400 06/18/20 0500 06/18/20 0600  BP: 101/61 90/65 122/74 116/76  Pulse: 71 71 75 75  Resp: 19 18 17 18   Temp:      TempSrc:      SpO2: 95% 95% 98% 95%  Weight:      Height:        Intake/Output Summary (Last 24 hours) at 06/18/2020 0904 Last data filed at 06/18/2020 0500 Gross per 24 hour  Intake 423.36 ml  Output 1500 ml  Net -1076.64 ml   Filed Weights   06/16/20 2212  Weight: 74.1 kg    Physical Exam    GEN: Well nourished, well developed, in no acute distress.  HEENT: normal.  Neck: Supple, no JVD, carotid bruits, or masses. Cardiac: RRR, no murmurs, rubs, or gallops. No clubbing, cyanosis, edema.  Radials/DP/PT 2+ and equal bilaterally.  Respiratory:  Respirations regular and unlabored, clear to auscultation bilaterally. GI: Soft, nontender, nondistended, BS + x 4. MS: no deformity or atrophy. Skin: warm and dry, no rash. Neuro:  Strength and sensation are intact. Psych: Normal affect.  Labs    CBC Recent Labs    06/17/20 0602  WBC 7.2  HGB 11.3*  HCT 32.9*  MCV 83.1  PLT 818   Basic Metabolic Panel Recent Labs    06/17/20 0602 06/18/20 0421  NA 137 138  K 3.8 3.9  CL 105 108  CO2 24 24  GLUCOSE 133* 104*  BUN 17 20  CREATININE 1.02 1.14  CALCIUM 8.4* 8.5*  MG  --  2.3   Liver Function Tests No results for input(s): AST, ALT, ALKPHOS, BILITOT, PROT, ALBUMIN in the last 72 hours. No results for input(s): LIPASE, AMYLASE in the last 72 hours. Cardiac Enzymes No results for input(s): CKTOTAL, CKMB, CKMBINDEX, TROPONINI in the last 72 hours. BNP No results for input(s): BNP in the last 72 hours. D-Dimer No results for input(s): DDIMER in the last 72 hours. Hemoglobin A1C No results for input(s): HGBA1C in the last 72 hours. Fasting Lipid Panel No results for input(s): CHOL, HDL,  LDLCALC, TRIG, CHOLHDL, LDLDIRECT in the last 72 hours. Thyroid Function Tests No results for input(s): TSH, T4TOTAL, T3FREE, THYROIDAB in the last 72 hours.  Invalid input(s): FREET3  Telemetry    Sinus rhythm  ECG    Initial sinus rhythm with ST elevation in anterolateral leads.  Currently sinus rhythm with no ischemia  Radiology    CARDIAC CATHETERIZATION  Result Date: 06/16/2020  2nd Mrg lesion is 40% stenosed.  Prox LAD lesion is 99% stenosed.  A drug-eluting stent was successfully placed using a STENT RESOLUTE ONYX 3.0X26.  Post intervention, there is a 0% residual stenosis.  Mid LAD lesion is 50% stenosed.  Prox RCA lesion is 30% stenosed.  1.  Anterior ST elevation myocardial infarction 2.  One-vessel coronary artery disease with ulcerative 99% stenosis proximal LAD 3.  Mild use left ventricular function with apical wall hypokinesis 4.  Successful primary PCI with 3.0 x 26 mm resolute Onyx drug-eluting stent proximal LAD Recommendations 1.  Dual antiplatelet therapy uninterrupted for 1 year 2.  Uptitrate atorvastatin 80 mg daily 3.  Add metoprolol tartrate 25 mg twice daily 4.  2D echocardiogram  ECHOCARDIOGRAM COMPLETE  Result Date: 06/17/2020    ECHOCARDIOGRAM REPORT   Patient Name:   Matthew Kelley Date of Exam: 06/17/2020 Medical Rec #:  [id number]        Height:       73.0 in Accession #:    [id number]       Weight:       163.4 lb Date of Birth:  1960/07/27         BSA:          1.974 m Patient Age:    68 years         BP:           116/70 mmHg Patient Gender: M                HR:           63 bpm. Exam Location:  [facility] Procedure: 2D Echo, Cardiac Doppler and Color Doppler Indications:     Acute myocardial infarction I21.9  History:         Patient has no prior history of Echocardiogram examinations.                  Hypothyroidism.  Sonographer:     Sherrie Sport RDCS (AE) Referring Phys:  [place] Diagnosing Phys: Bartholome Bill MD IMPRESSIONS  1. Left  ventricular ejection fraction, by estimation, is 40 to 45%. The left ventricle has normal function. The left ventricle demonstrates regional wall motion abnormalities (see scoring diagram/findings for  description). Left ventricular diastolic parameters were normal.  2. Right ventricular systolic function is normal. The right ventricular size is normal.  3. The mitral valve is grossly normal. Trivial mitral valve regurgitation.  4. The aortic valve is grossly normal. Aortic valve regurgitation is not visualized. FINDINGS  Left Ventricle: Left ventricular ejection fraction, by estimation, is 40 to 45%. The left ventricle has normal function. The left ventricle demonstrates regional wall motion abnormalities. The left ventricular internal cavity size was normal in size. There is no left ventricular hypertrophy. Left ventricular diastolic parameters were normal.  LV Wall Scoring: The mid anteroseptal segment is akinetic. The basal anteroseptal segment, mid inferolateral segment, basal anterior segment, and basal inferoseptal segment are hypokinetic. The basal inferolateral segment, basal anterolateral segment, and basal inferior segment are normal. Right Ventricle: The right ventricular size is normal. No increase in right ventricular wall thickness. Right ventricular systolic function is normal. Left Atrium: Left atrial size was normal in size. Right Atrium: Right atrial size was normal in size. Pericardium: There is no evidence of pericardial effusion. Mitral Valve: The mitral valve is grossly normal. Trivial mitral valve regurgitation. Tricuspid Valve: The tricuspid valve is not well visualized. Tricuspid valve regurgitation is trivial. Aortic Valve: The aortic valve is grossly normal. Aortic valve regurgitation is not visualized. Aortic valve mean gradient measures 2.0 mmHg. Aortic valve peak gradient measures 3.9 mmHg. Aortic valve area, by VTI measures 3.12 cm. Pulmonic Valve: The pulmonic valve was not well  visualized. Pulmonic valve regurgitation is not visualized. Aorta: The aortic root is normal in size and structure. IAS/Shunts: The interatrial septum was not assessed.  LEFT VENTRICLE PLAX 2D LVIDd:         4.99 cm      Diastology LVIDs:         3.83 cm      LV e' medial:    5.77 cm/s LV PW:         0.66 cm      LV E/e' medial:  8.6 LV IVS:        0.75 cm      LV e' lateral:   4.90 cm/s LVOT diam:     2.00 cm      LV E/e' lateral: 10.1 LV SV:         55 LV SV Index:   28 LVOT Area:     3.14 cm  LV Volumes (MOD) LV vol d, MOD A2C: 69.9 ml LV vol d, MOD A4C: 103.0 ml LV vol s, MOD A2C: 34.9 ml LV vol s, MOD A4C: 61.6 ml LV SV MOD A2C:     35.0 ml LV SV MOD A4C:     103.0 ml LV SV MOD BP:      38.8 ml RIGHT VENTRICLE RV Basal diam:  2.76 cm RV S prime:     13.70 cm/s TAPSE (M-mode): 4.4 cm LEFT ATRIUM           Index       RIGHT ATRIUM           Index LA diam:      3.00 cm 1.52 cm/m  RA Area:     15.00 cm LA Vol (A2C): 47.1 ml 23.86 ml/m RA Volume:   38.30 ml  19.40 ml/m LA Vol (A4C): 15.3 ml 7.75 ml/m  AORTIC VALVE                   PULMONIC VALVE AV Area (Vmax):    2.73  cm    PV Vmax:        0.37 m/s AV Area (Vmean):   2.45 cm    PV Peak grad:   0.6 mmHg AV Area (VTI):     3.12 cm    RVOT Peak grad: 2 mmHg AV Vmax:           98.30 cm/s AV Vmean:          73.350 cm/s AV VTI:            0.178 m AV Peak Grad:      3.9 mmHg AV Mean Grad:      2.0 mmHg LVOT Vmax:         85.50 cm/s LVOT Vmean:        57.100 cm/s LVOT VTI:          0.176 m LVOT/AV VTI ratio: 0.99  AORTA Ao Root diam: 3.63 cm MITRAL VALVE               TRICUSPID VALVE MV Area (PHT): 3.39 cm    TR Peak grad:   18.0 mmHg MV Decel Time: 224 msec    TR Vmax:        212.00 cm/s MV E velocity: 49.70 cm/s MV A velocity: 74.60 cm/s  SHUNTS MV E/A ratio:  0.67        Systemic VTI:  0.18 m                            Systemic Diam: 2.00 cm Bartholome Bill MD Electronically signed by Bartholome Bill MD Signature Date/Time: 06/17/2020/1:18:14 PM    Final      Assessment & Plan    60 year old patient with no prior cardiac history who developed chest pain secondary to acute anterior myocardial infarction with a 99% proximal LAD lesion.  Had antegrade flow.  Underwent successful PCI with a 3.0 x 26 mm resolute Onyx stent.  Doing well post PCI.  No further chest pain.  1.  Coronary disease-status post anterior myocardial infarction.  Status post direct PCI of the LAD with no residual lesion.  Echocardiogram showed an EF of approximately 45% with anteroapical hypokinesis.  We will continue with dual antiplatelet therapy including enteric-coated aspirin 81 mg daily and ticagrelor 90 mg twice daily.  We will also continue with lisinopril 5 mg daily and continue metoprolol tartrate 25 mg twice daily.  Will continue with Lipitor at 80 mg daily.  We will arrange for phase 2 cardiac rehab post discharge.    Appears stable for discharge.  When ambulating discharged to home on aforementioned medications.  Patient to cardiac rehab has been ordered.  We will follow up in our office in 1 week.  Signed, Javier Docker Fath MD 06/18/2020, 9:04 AM  Pager: [phone]

## 2020-06-18 NOTE — Discharge Summary (Addendum)
[facility] [place] NAME: Matthew Kelley    MR#:  [id number]  DATE OF BIRTH:  [birth year]  DATE OF ADMISSION:  06/16/2020 ADMITTING PHYSICIAN: Orene Desanctis, DO  DATE OF DISCHARGE: 06/18/2020  PRIMARY CARE PHYSICIAN: Einar Pheasant, MD    ADMISSION DIAGNOSIS:  Acute ST elevation myocardial infarction (STEMI) involving left anterior descending (LAD) coronary artery (HCC) [I21.02] STEMI (ST elevation myocardial infarction) ([place]) [I21.3]  DISCHARGE DIAGNOSIS:  Acute STEMI anterior-lateral wall s/p PCI of LAD  SECONDARY DIAGNOSIS:   Past Medical History:  Diagnosis Date  . H/O total thyroidectomy   . Hypothyroidism   . Thyroid cancer [facility])     HOSPITAL COURSE:   Matthew Kelley a 60 y.o.malewith medical history significant forhistory of thyroid cancer s/p thyroidectomy, OSA on CPAP, IBS, history of iron deficiency anemia, history of GI bleed with AVM and hyperlipidemia who presents with chest pain. He was at the gym lifting earlier this evening when he developed sudden acute onset midsternal chest pain with radiation up to his neck.  Acute Anterior- lateral STEMI --S/p PCI with DES to proximal LAD by Dr Josefa Half on 3/10 --Continue DAPT (ASA +Brilinta) for a year --Atorvastatin 80 mg -- metoprololtartrate 25 mgBID, lisinopril --Echo EF 40-45%with WMA of LV -- hemodynamically stable --troponin >27K-->15K --sats >92% on RA --cardiac rehab ordered per Dr Ubaldo Glassing  Elevated creatinine--resovled --Creatinine of 1.25  on admission-->1.0  OSA Continue CPAP  Hxof thyroid cancer s/p thyroidectomy --Continue levothyroxine  HLD --Atorvastatin increased to 80mg   DVT prophylaxis:SCDs Code Status: Full Family Communication: Plan discussed with patient at bedside  disposition Plan: Home today Consults called:cardiology Dr Fath/Parachos Level of care:Stepdown  Status is: Inpatient  Dispo: The patient is  from:Home Anticipated d/c is HQ:IONG Patient currently is  medically stable to d/c.  d/w dr Ubaldo Glassing today. Ok to d/c home. Pt is in agreement with plan.   CONSULTS OBTAINED:  Treatment Team:  Teodoro Spray, MD  DRUG ALLERGIES:  No Known Allergies  DISCHARGE MEDICATIONS:   Allergies as of 06/18/2020   No Known Allergies     Medication List    STOP taking these medications   ondansetron 4 MG tablet Commonly known as: ZOFRAN     TAKE these medications   aspirin 81 MG chewable tablet Chew 1 tablet (81 mg total) by mouth daily.   atorvastatin 80 MG tablet Commonly known as: LIPITOR Take 1 tablet (80 mg total) by mouth daily. What changed:  medication strength how much to take   cholecalciferol 25 MCG (1000 UNIT) tablet Commonly known as: VITAMIN D3 Take 1,000 Units by mouth daily.   finasteride 1 MG tablet Commonly known as: PROPECIA Take 1 mg by mouth daily.   ketoconazole 2 % shampoo Commonly known as: NIZORAL Apply 1 application topically daily as needed.   levothyroxine 175 MCG tablet Commonly known as: SYNTHROID Take 175 mcg by mouth daily before breakfast.   lisinopril 5 MG tablet Commonly known as: ZESTRIL Take 1 tablet (5 mg total) by mouth daily.   metoprolol tartrate 25 MG tablet Commonly known as: LOPRESSOR Take 1 tablet (25 mg total) by mouth 2 (two) times daily.   multivitamin with minerals Tabs tablet Take 1 tablet by mouth daily. AREDS vitamin   omeprazole 40 MG capsule Commonly known as: PRILOSEC Take 40 mg by mouth 2 (two) times daily.   Restasis 0.05 % ophthalmic emulsion Generic drug: cycloSPORINE Place 1 drop into both eyes 2 (two)  times daily.   ticagrelor 90 MG Tabs tablet Commonly known as: BRILINTA Take 1 tablet (90 mg total) by mouth 2 (two) times daily.   valACYclovir 1000 MG tablet Commonly known as: VALTREX Take 1,000 mg by mouth 3 (three) times daily.       If you experience  worsening of your admission symptoms, develop shortness of breath, life threatening emergency, suicidal or homicidal thoughts you must seek medical attention immediately by calling 911 or calling your MD immediately  if symptoms less severe.  You Must read complete instructions/literature along with all the possible adverse reactions/side effects for all the Medicines you take and that have been prescribed to you. Take any new Medicines after you have completely understood and accept all the possible adverse reactions/side effects.   Please note  You were cared for by a hospitalist during your hospital stay. If you have any questions about your discharge medications or the care you received while you were in the hospital after you are discharged, you can call the unit and asked to speak with the hospitalist on call if the hospitalist that took care of you is not available. Once you are discharged, your primary care physician will handle any further medical issues. Please note that NO REFILLS for any discharge medications will be authorized once you are discharged, as it is imperative that you return to your primary care physician (or establish a relationship with a primary care physician if you do not have one) for your aftercare needs so that they can reassess your need for medications and monitor your lab values. Today   SUBJECTIVE   Doing well. No cp  VITAL SIGNS:  Blood pressure 116/76, pulse 75, temperature 98.6 F (37 C), temperature source Oral, resp. rate 18, height 6\' 1"  (1.854 m), weight 74.1 kg, SpO2 95 %.  I/O:    Intake/Output Summary (Last 24 hours) at 06/18/2020 0909 Last data filed at 06/18/2020 0500 Gross per 24 hour  Intake 423.36 ml  Output 1500 ml  Net -1076.64 ml    PHYSICAL EXAMINATION:  GENERAL:  60 y.o.-year-old patient lying in the bed with no acute distress.  LUNGS: Normal breath sounds bilaterally, no wheezing, rales,rhonchi or crepitation. No use of accessory  muscles of respiration.  CARDIOVASCULAR: S1, S2 normal. No murmurs, rubs, or gallops.  ABDOMEN: Soft, non-tender, non-distended. Bowel sounds present. No organomegaly or mass.  EXTREMITIES: No pedal edema, cyanosis, or clubbing.  NEUROLOGIC: Cranial nerves II through XII are intact. Muscle strength 5/5 in all extremities. Sensation intact. Gait not checked.  PSYCHIATRIC: The patient is alert and oriented x 3.  SKIN: No obvious rash, lesion, or ulcer.   DATA REVIEW:   CBC  Recent Labs  Lab 06/17/20 0602  WBC 7.2  HGB 11.3*  HCT 32.9*  PLT 198    Chemistries  Recent Labs  Lab 06/18/20 0421  NA 138  K 3.9  CL 108  CO2 24  GLUCOSE 104*  BUN 20  CREATININE 1.14  CALCIUM 8.5*  MG 2.3    Microbiology Results   Recent Results (from the past 240 hour(s))  MRSA PCR Screening     Status: None   Collection Time: 06/17/20  9:00 AM   Specimen: Nasopharyngeal  Result Value Ref Range Status   MRSA by PCR NEGATIVE NEGATIVE Final    Comment:        The GeneXpert MRSA Assay (FDA approved for NASAL specimens only), is one component of a comprehensive MRSA colonization surveillance program.  It is not intended to diagnose MRSA infection nor to guide or monitor treatment for MRSA infections. Performed at [facility], [address] [place], [place] [address]     RADIOLOGY:  CARDIAC CATHETERIZATION  Result Date: 06/16/2020  2nd Mrg lesion is 40% stenosed.  Prox LAD lesion is 99% stenosed.  A drug-eluting stent was successfully placed using a STENT RESOLUTE ONYX 3.0X26.  Post intervention, there is a 0% residual stenosis.  Mid LAD lesion is 50% stenosed.  Prox RCA lesion is 30% stenosed.  1.  Anterior ST elevation myocardial infarction 2.  One-vessel coronary artery disease with ulcerative 99% stenosis proximal LAD 3.  Mild use left ventricular function with apical wall hypokinesis 4.  Successful primary PCI with 3.0 x 26 mm resolute Onyx drug-eluting stent  proximal LAD Recommendations 1.  Dual antiplatelet therapy uninterrupted for 1 year 2.  Uptitrate atorvastatin 80 mg daily 3.  Add metoprolol tartrate 25 mg twice daily 4.  2D echocardiogram  ECHOCARDIOGRAM COMPLETE  Result Date: 06/17/2020    ECHOCARDIOGRAM REPORT   Patient Name:   Matthew Kelley Date of Exam: 06/17/2020 Medical Rec #:  [id number]        Height:       73.0 in Accession #:    [id number]       Weight:       163.4 lb Date of Birth:  [birth year]         BSA:          1.974 m Patient Age:    89 years         BP:           116/70 mmHg Patient Gender: M                HR:           63 bpm. Exam Location:  [facility] Procedure: 2D Echo, Cardiac Doppler and Color Doppler Indications:     Acute myocardial infarction I21.9  History:         Patient has no prior history of Echocardiogram examinations.                  Hypothyroidism.  Sonographer:     Sherrie Sport RDCS (AE) Referring Phys:  [place] Diagnosing Phys: Bartholome Bill MD IMPRESSIONS  1. Left ventricular ejection fraction, by estimation, is 40 to 45%. The left ventricle has normal function. The left ventricle demonstrates regional wall motion abnormalities (see scoring diagram/findings for description). Left ventricular diastolic parameters were normal.  2. Right ventricular systolic function is normal. The right ventricular size is normal.  3. The mitral valve is grossly normal. Trivial mitral valve regurgitation.  4. The aortic valve is grossly normal. Aortic valve regurgitation is not visualized. FINDINGS  Left Ventricle: Left ventricular ejection fraction, by estimation, is 40 to 45%. The left ventricle has normal function. The left ventricle demonstrates regional wall motion abnormalities. The left ventricular internal cavity size was normal in size. There is no left ventricular hypertrophy. Left ventricular diastolic parameters were normal.  LV Wall Scoring: The mid anteroseptal segment is akinetic. The basal anteroseptal segment,  mid inferolateral segment, basal anterior segment, and basal inferoseptal segment are hypokinetic. The basal inferolateral segment, basal anterolateral segment, and basal inferior segment are normal. Right Ventricle: The right ventricular size is normal. No increase in right ventricular wall thickness. Right ventricular systolic function is normal. Left Atrium: Left atrial size was normal in size. Right Atrium: Right atrial  size was normal in size. Pericardium: There is no evidence of pericardial effusion. Mitral Valve: The mitral valve is grossly normal. Trivial mitral valve regurgitation. Tricuspid Valve: The tricuspid valve is not well visualized. Tricuspid valve regurgitation is trivial. Aortic Valve: The aortic valve is grossly normal. Aortic valve regurgitation is not visualized. Aortic valve mean gradient measures 2.0 mmHg. Aortic valve peak gradient measures 3.9 mmHg. Aortic valve area, by VTI measures 3.12 cm. Pulmonic Valve: The pulmonic valve was not well visualized. Pulmonic valve regurgitation is not visualized. Aorta: The aortic root is normal in size and structure. IAS/Shunts: The interatrial septum was not assessed.  LEFT VENTRICLE PLAX 2D LVIDd:         4.99 cm      Diastology LVIDs:         3.83 cm      LV e' medial:    5.77 cm/s LV PW:         0.66 cm      LV E/e' medial:  8.6 LV IVS:        0.75 cm      LV e' lateral:   4.90 cm/s LVOT diam:     2.00 cm      LV E/e' lateral: 10.1 LV SV:         55 LV SV Index:   28 LVOT Area:     3.14 cm  LV Volumes (MOD) LV vol d, MOD A2C: 69.9 ml LV vol d, MOD A4C: 103.0 ml LV vol s, MOD A2C: 34.9 ml LV vol s, MOD A4C: 61.6 ml LV SV MOD A2C:     35.0 ml LV SV MOD A4C:     103.0 ml LV SV MOD BP:      38.8 ml RIGHT VENTRICLE RV Basal diam:  2.76 cm RV S prime:     13.70 cm/s TAPSE (M-mode): 4.4 cm LEFT ATRIUM           Index       RIGHT ATRIUM           Index LA diam:      3.00 cm 1.52 cm/m  RA Area:     15.00 cm LA Vol (A2C): 47.1 ml 23.86 ml/m RA Volume:    38.30 ml  19.40 ml/m LA Vol (A4C): 15.3 ml 7.75 ml/m  AORTIC VALVE                   PULMONIC VALVE AV Area (Vmax):    2.73 cm    PV Vmax:        0.37 m/s AV Area (Vmean):   2.45 cm    PV Peak grad:   0.6 mmHg AV Area (VTI):     3.12 cm    RVOT Peak grad: 2 mmHg AV Vmax:           98.30 cm/s AV Vmean:          73.350 cm/s AV VTI:            0.178 m AV Peak Grad:      3.9 mmHg AV Mean Grad:      2.0 mmHg LVOT Vmax:         85.50 cm/s LVOT Vmean:        57.100 cm/s LVOT VTI:          0.176 m LVOT/AV VTI ratio: 0.99  AORTA Ao Root diam: 3.63 cm MITRAL VALVE  TRICUSPID VALVE MV Area (PHT): 3.39 cm    TR Peak grad:   18.0 mmHg MV Decel Time: 224 msec    TR Vmax:        212.00 cm/s MV E velocity: 49.70 cm/s MV A velocity: 74.60 cm/s  SHUNTS MV E/A ratio:  0.67        Systemic VTI:  0.18 m                            Systemic Diam: 2.00 cm Bartholome Bill MD Electronically signed by Bartholome Bill MD Signature Date/Time: 06/17/2020/1:18:14 PM    Final      CODE STATUS:     Code Status Orders  (From admission, onward)         Start     Ordered   06/16/20 2208  Full code  Continuous        06/16/20 2207        Code Status History    This patient has a current code status but no historical code status.   Advance Care Planning Activity       TOTAL TIME TAKING CARE OF THIS PATIENT: 40 minutes.    Fritzi Mandes M.D  [facility]    CC: Primary care physician; Einar Pheasant, MD

## 2020-06-18 NOTE — Plan of Care (Signed)
  Problem: Education: Goal: Understanding of CV disease, CV risk reduction, and recovery process will improve Outcome: Progressing   Problem: Activity: Goal: Ability to return to baseline activity level will improve Outcome: Progressing   Problem: Cardiovascular: Goal: Ability to achieve and maintain adequate cardiovascular perfusion will improve Outcome: Progressing Goal: Vascular access site(s) Level 0-1 will be maintained Outcome: Progressing   

## 2020-06-20 ENCOUNTER — Encounter: Payer: Self-pay | Admitting: Internal Medicine

## 2020-06-21 ENCOUNTER — Other Ambulatory Visit: Payer: Self-pay | Admitting: *Deleted

## 2020-06-21 ENCOUNTER — Telehealth: Payer: Self-pay

## 2020-06-21 NOTE — Patient Outreach (Signed)
[facility]) Care Management  06/21/2020  Matthew Kelley Apr 28, 1960 [id number]   Transition of care telephone call  Referral received:06/21/20 Initial outreach: 06/21/20 Insurance: [facility]   Initial unsuccessful telephone call to patient's preferred number in order to complete transition of care assessment; no answer, left HIPAA compliant voicemail message requesting return call.   Objective: Per the electronic medical record,Matthew Kelley was hospitalized at [facility] 3/10-3/12/22 for  Chest pain, Acute ST elevation infarction, PCI of LAD and DES  Comorbidities include: Hyperlipidemia, OSA with CPAP,  He was discharged to home on 06/18/20 without the need for home health services or DME.  Plan: This RNCM will route unsuccessful outreach letter with [place] Management pamphlet and 24 hour Nurse Advice Line Magnet to [place] Management clinical pool to be mailed to patient's home address. This RNCM will attempt another outreach within 4 business days.  [name], RN, BSN  [place] Management Coordinator  [phone]- Mobile [phone]- Toll Free Main Office

## 2020-06-21 NOTE — Telephone Encounter (Signed)
Patient plans to complete hospital follow up with Cardiology. No transition of care completed at this time. 2 month follow up scheduled with pcp.

## 2020-06-23 ENCOUNTER — Other Ambulatory Visit: Payer: Self-pay | Admitting: *Deleted

## 2020-06-23 DIAGNOSIS — G4733 Obstructive sleep apnea (adult) (pediatric): Secondary | ICD-10-CM | POA: Diagnosis not present

## 2020-06-23 NOTE — Patient Outreach (Signed)
[facility]) Care Management  06/23/2020  Matthew Kelley 01-Mar-1961 [id number]   Transition of care call Referral received: 06/21/20 Initial outreach attempt: 06/21/20 Insurance: [employer]    2nd unsuccessful telephone call to patient's preferred contact number in order to complete post hospital discharge transition of care assessment , no answer left HIPAA compliant message requesting return call.    Objective: Per the electronic medical record,.Matthew Kelley was hospitalized [facility] 3/10-3/12/22 for  Chest pain, Acute ST elevation infarction, PCI of LAD and DES  Comorbidities include: Hyperlipidemia, OSA with CPAP,  He was discharged to home on 06/18/20 without the need for home health servicesor DME.Referral for outpatient cardiac rehab.    Plan If no return call from patient will attempt 3rd outreach in the next 4 business days.   [name], RN, BSN  [place] Management Coordinator  [phone]- Mobile [phone]- Toll Free Main Office

## 2020-06-28 DIAGNOSIS — H353132 Nonexudative age-related macular degeneration, bilateral, intermediate dry stage: Secondary | ICD-10-CM | POA: Diagnosis not present

## 2020-06-28 DIAGNOSIS — H35372 Puckering of macula, left eye: Secondary | ICD-10-CM | POA: Diagnosis not present

## 2020-06-29 ENCOUNTER — Other Ambulatory Visit: Payer: Self-pay | Admitting: *Deleted

## 2020-06-29 NOTE — Patient Outreach (Signed)
[facility]) Care Management  06/29/2020  Matthew Kelley 11/16/1960 [id number]  Transition of care call Referral received: 06/21/20 Initial outreach attempt: 06/21/20 Insurance: [employer] unsuccessful telephone call to patient's preferred contact number in order to complete post hospital discharge transition of care assessment; no answer, left HIPAA compliant message requesting return call.   Objective: [facility] the electronic medical record,.[facility], Acute ST elevation infarction, PCI of LAD and DES Comorbidities include: Hyperlipidemia, OSA with CPAP,  Plan: If no return call from patient, will plan return call in the next 3 weeks.    [name], RN, BSN  [place] Management Coordinator  [phone]- Mobile [phone]- Toll Free Main Office

## 2020-07-08 ENCOUNTER — Other Ambulatory Visit: Payer: Self-pay

## 2020-07-08 ENCOUNTER — Encounter: Payer: 59 | Attending: Cardiology | Admitting: *Deleted

## 2020-07-08 VITALS — Ht 73.1 in | Wt 191.1 lb

## 2020-07-08 DIAGNOSIS — Z955 Presence of coronary angioplasty implant and graft: Secondary | ICD-10-CM | POA: Diagnosis not present

## 2020-07-08 DIAGNOSIS — Z79899 Other long term (current) drug therapy: Secondary | ICD-10-CM | POA: Insufficient documentation

## 2020-07-08 DIAGNOSIS — I2102 ST elevation (STEMI) myocardial infarction involving left anterior descending coronary artery: Secondary | ICD-10-CM

## 2020-07-08 DIAGNOSIS — I213 ST elevation (STEMI) myocardial infarction of unspecified site: Secondary | ICD-10-CM | POA: Diagnosis not present

## 2020-07-08 DIAGNOSIS — Z7901 Long term (current) use of anticoagulants: Secondary | ICD-10-CM | POA: Insufficient documentation

## 2020-07-08 DIAGNOSIS — Z7982 Long term (current) use of aspirin: Secondary | ICD-10-CM | POA: Insufficient documentation

## 2020-07-08 NOTE — Progress Notes (Signed)
Cardiac Individual Treatment Plan  Patient Details  Name: Matthew Kelley MRN: [id number] Date of Birth: 30-Nov-1960 Referring Provider:   Flowsheet Row Cardiac Rehab from 07/08/2020 in [facility] Cardiac and Pulmonary Rehab  Referring Provider [name] MD      Initial Encounter Date:  Flowsheet Row Cardiac Rehab from 07/08/2020 in [facility] Cardiac and Pulmonary Rehab  Date 07/08/20      Visit Diagnosis: ST elevation myocardial infarction involving left anterior descending (LAD) coronary artery [facility])  Status post coronary artery stent placement  Patient's Home Medications on Admission:  Current Outpatient Medications:  .  aspirin 81 MG chewable tablet, Chew 1 tablet (81 mg total) by mouth daily., Disp: 30 tablet, Rfl: 2 .  atorvastatin (LIPITOR) 80 MG tablet, Take 1 tablet (80 mg total) by mouth daily., Disp: 30 tablet, Rfl: 1 .  cholecalciferol (VITAMIN D3) 25 MCG (1000 UNIT) tablet, Take 1,000 Units by mouth daily., Disp: , Rfl:  .  finasteride (PROPECIA) 1 MG tablet, TAKE 1 TABLET BY MOUTH ONCE DAILY, Disp: 90 tablet, Rfl: 10 .  ketoconazole (NIZORAL) 2 % shampoo, Massage into scalp once daily as needed. Let sit several minutes before rinsing., Disp: 360 mL, Rfl: 3 .  levothyroxine (SYNTHROID, LEVOTHROID) 175 MCG tablet, Take 1 tablet (175 mcg total) by mouth daily before breakfast., Disp: 90 tablet, Rfl: 1 .  lisinopril (ZESTRIL) 5 MG tablet, Take 1 tablet (5 mg total) by mouth daily., Disp: 30 tablet, Rfl: 1 .  metoprolol tartrate (LOPRESSOR) 25 MG tablet, Take 1 tablet (25 mg total) by mouth 2 (two) times daily., Disp: 60 tablet, Rfl: 1 .  Multiple Vitamins-Minerals (PRESERVISION AREDS 2) CAPS, , Disp: , Rfl:  .  omeprazole (PRILOSEC) 40 MG capsule, TAKE 1 CAPSULE BY MOUTH TWICE DAILY, Disp: 60 capsule, Rfl: 3 .  RESTASIS 0.05 % ophthalmic emulsion, instill 1 drop into both eyes twice a day, Disp: , Rfl: 0 .  ticagrelor (BRILINTA) 90 MG TABS tablet, Take 1 tablet (90 mg total) by  mouth 2 (two) times daily., Disp: 60 tablet, Rfl: 1 .  valACYclovir (VALTREX) 1000 MG tablet, Take 1,000 mg by mouth 3 (three) times daily., Disp: , Rfl:  .  atorvastatin (LIPITOR) 20 MG tablet, TAKE 1 TABLET BY MOUTH DAILY., Disp: 90 tablet, Rfl: 1 .  cholecalciferol (VITAMIN D) 1000 units tablet, Take 1 tablet (1,000 Units total) by mouth daily., Disp: 90 tablet, Rfl: 1 .  finasteride (PROPECIA) 1 MG tablet, Take 1 mg by mouth daily., Disp: , Rfl:  .  ketoconazole (NIZORAL) 2 % shampoo, Apply 1 application topically daily as needed., Disp: , Rfl:  .  levothyroxine (SYNTHROID) 175 MCG tablet, Take 175 mcg by mouth daily before breakfast., Disp: , Rfl:  .  Multiple Vitamin (MULTIVITAMIN WITH MINERALS) TABS tablet, Take 1 tablet by mouth daily. AREDS vitamin, Disp: , Rfl:  .  omeprazole (PRILOSEC) 40 MG capsule, Take 40 mg by mouth 2 (two) times daily., Disp: , Rfl:  .  RESTASIS 0.05 % ophthalmic emulsion, Place 1 drop into both eyes 2 (two) times daily., Disp: , Rfl:  .  valACYclovir (VALTREX) 1000 MG tablet, Take 1,000 mg by mouth 3 (three) times daily., Disp: , Rfl:   Past Medical History: Past Medical History:  Diagnosis Date  . Blood in stool    h/o  . Cancer [facility])    thyroid cancer  . H/O total thyroidectomy   . History of chicken pox   . Hypercholesterolemia   . Hypothyroidism   .  Sleep apnea   . Thyroid cancer ([place])   . Vertigo     Tobacco Use: Social History   Tobacco Use  Smoking Status Never Smoker  Smokeless Tobacco Never Used    Labs: Recent Review Flowsheet Data    Labs for ITP Cardiac and Pulmonary Rehab Latest Ref Rng & Units 01/21/2019 04/24/2019 07/17/2019 09/04/2019 12/25/2019   Cholestrol 0 - 200 mg/dL 166 192 147 162 135   LDLCALC 0 - 99 mg/dL 113 133(H) 91 102(H) 79   HDL >39.00 mg/dL 41 47.00 42 41.80 42.40   Trlycerides 0.0 - 149.0 mg/dL 58 61.0 68 90.0 66.0   Hemoglobin A1c 4.6 - 6.5 % - - - 5.7 -       Exercise Target Goals: Exercise Program  Goal: Individual exercise prescription set using results from initial 6 min walk test and THRR while considering  patient's activity barriers and safety.   Exercise Prescription Goal: Initial exercise prescription builds to 30-45 minutes a day of aerobic activity, 2-3 days per week.  Home exercise guidelines will be given to patient during program as part of exercise prescription that the participant will acknowledge.   Education: Aerobic Exercise: - Group verbal and visual presentation on the components of exercise prescription. Introduces F.I.T.T principle from ACSM for exercise prescriptions.  Reviews F.I.T.T. principles of aerobic exercise including progression. Written material given at graduation. Flowsheet Row Cardiac Rehab from 07/08/2020 in [facility] Cardiac and Pulmonary Rehab  Education need identified 07/08/20      Education: Resistance Exercise: - Group verbal and visual presentation on the components of exercise prescription. Introduces F.I.T.T principle from ACSM for exercise prescriptions  Reviews F.I.T.T. principles of resistance exercise including progression. Written material given at graduation.    Education: Exercise & Equipment Safety: - Individual verbal instruction and demonstration of equipment use and safety with use of the equipment. Flowsheet Row Cardiac Rehab from 07/08/2020 in [facility] Cardiac and Pulmonary Rehab  Date 07/08/20  Educator [facility]  Instruction Review Code 1- Verbalizes Understanding      Education: Exercise Physiology & General Exercise Guidelines: - Group verbal and written instruction with models to review the exercise physiology of the cardiovascular system and associated critical values. Provides general exercise guidelines with specific guidelines to those with heart or lung disease.    Education: Flexibility, Balance, Mind/Body Relaxation: - Group verbal and visual presentation with interactive activity on the components of exercise prescription.  Introduces F.I.T.T principle from ACSM for exercise prescriptions. Reviews F.I.T.T. principles of flexibility and balance exercise training including progression. Also discusses the mind body connection.  Reviews various relaxation techniques to help reduce and manage stress (i.e. Deep breathing, progressive muscle relaxation, and visualization). Balance handout provided to take home. Written material given at graduation.   Activity Barriers & Risk Stratification:  Activity Barriers & Cardiac Risk Stratification - 07/08/20 0736      Activity Barriers & Cardiac Risk Stratification   Activity Barriers Back Problems   occasional back pain   Cardiac Risk Stratification Moderate           6 Minute Walk:  6 Minute Walk    Row Name 07/08/20 1020         6 Minute Walk   Phase Initial     Distance 1770 feet     Walk Time 6 minutes     # of Rest Breaks 0     MPH 3.35     METS 4.64     RPE 11  VO2 Peak 16.23     Symptoms No     Resting HR 67 bpm     Resting BP 132/56     Resting Oxygen Saturation  97 %     Exercise Oxygen Saturation  during 6 min walk 99 %     Max Ex. HR 112 bpm     Max Ex. BP 138/74     2 Minute Post BP 122/70            Oxygen Initial Assessment:   Oxygen Re-Evaluation:   Oxygen Discharge (Final Oxygen Re-Evaluation):   Initial Exercise Prescription:  Initial Exercise Prescription - 07/08/20 1000      Date of Initial Exercise RX and Referring Provider   Date 07/08/20    Referring Provider Paraschos, Alexander MD      Treadmill   MPH 3.3    Grade 2    Minutes 15    METs 4.44      Elliptical   Level 1    Speed 4.1    Minutes 15    METs 4      REL-XR   Level 4    Speed 50    Minutes 15    METs 4      Prescription Details   Frequency (times per week) 3    Duration Progress to 30 minutes of continuous aerobic without signs/symptoms of physical distress      Intensity   THRR 40-80% of Max Heartrate 104-141    Ratings of Perceived  Exertion 11-13    Perceived Dyspnea 0-4      Progression   Progression Continue to progress workloads to maintain intensity without signs/symptoms of physical distress.      Resistance Training   Training Prescription Yes    Weight 10 lb    Reps 10-15           Perform Capillary Blood Glucose checks as needed.  Exercise Prescription Changes:  Exercise Prescription Changes    Row Name 07/08/20 1000             Response to Exercise   Blood Pressure (Admit) 132/56       Blood Pressure (Exercise) 138/74       Blood Pressure (Exit) 122/70       Heart Rate (Admit) 67 bpm       Heart Rate (Exercise) 112 bpm       Heart Rate (Exit) 67 bpm       Oxygen Saturation (Admit) 97 %       Oxygen Saturation (Exercise) 99 %       Rating of Perceived Exertion (Exercise) 11       Symptoms none       Comments walk test results              Exercise Comments:   Exercise Goals and Review:  Exercise Goals    Row Name 07/08/20 1024             Exercise Goals   Increase Physical Activity Yes       Intervention Provide advice, education, support and counseling about physical activity/exercise needs.;Develop an individualized exercise prescription for aerobic and resistive training based on initial evaluation findings, risk stratification, comorbidities and participant's personal goals.       Expected Outcomes Short Term: Attend rehab on a regular basis to increase amount of physical activity.;Long Term: Add in home exercise to make exercise part of routine and to increase amount of physical  activity.;Long Term: Exercising regularly at least 3-5 days a week.       Increase Strength and Stamina Yes       Intervention Provide advice, education, support and counseling about physical activity/exercise needs.;Develop an individualized exercise prescription for aerobic and resistive training based on initial evaluation findings, risk stratification, comorbidities and participant's personal  goals.       Expected Outcomes Short Term: Increase workloads from initial exercise prescription for resistance, speed, and METs.;Short Term: Perform resistance training exercises routinely during rehab and add in resistance training at home;Long Term: Improve cardiorespiratory fitness, muscular endurance and strength as measured by increased METs and functional capacity (6MWT)       Able to understand and use rate of perceived exertion (RPE) scale Yes       Intervention Provide education and explanation on how to use RPE scale       Expected Outcomes Short Term: Able to use RPE daily in rehab to express subjective intensity level;Long Term:  Able to use RPE to guide intensity level when exercising independently       Able to understand and use Dyspnea scale Yes       Intervention Provide education and explanation on how to use Dyspnea scale       Expected Outcomes Short Term: Able to use Dyspnea scale daily in rehab to express subjective sense of shortness of breath during exertion;Long Term: Able to use Dyspnea scale to guide intensity level when exercising independently       Knowledge and understanding of Target Heart Rate Range (THRR) Yes       Intervention Provide education and explanation of THRR including how the numbers were predicted and where they are located for reference       Expected Outcomes Short Term: Able to state/look up THRR;Short Term: Able to use daily as guideline for intensity in rehab;Long Term: Able to use THRR to govern intensity when exercising independently       Able to check pulse independently Yes       Intervention Provide education and demonstration on how to check pulse in carotid and radial arteries.;Review the importance of being able to check your own pulse for safety during independent exercise       Expected Outcomes Short Term: Able to explain why pulse checking is important during independent exercise;Long Term: Able to check pulse independently and accurately        Understanding of Exercise Prescription Yes       Intervention Provide education, explanation, and written materials on patient's individual exercise prescription       Expected Outcomes Short Term: Able to explain program exercise prescription;Long Term: Able to explain home exercise prescription to exercise independently              Exercise Goals Re-Evaluation :   Discharge Exercise Prescription (Final Exercise Prescription Changes):  Exercise Prescription Changes - 07/08/20 1000      Response to Exercise   Blood Pressure (Admit) 132/56    Blood Pressure (Exercise) 138/74    Blood Pressure (Exit) 122/70    Heart Rate (Admit) 67 bpm    Heart Rate (Exercise) 112 bpm    Heart Rate (Exit) 67 bpm    Oxygen Saturation (Admit) 97 %    Oxygen Saturation (Exercise) 99 %    Rating of Perceived Exertion (Exercise) 11    Symptoms none    Comments walk test results           Nutrition:  Target Goals: Understanding of nutrition guidelines, daily intake of sodium 1500mg , cholesterol 200mg , calories 30% from fat and 7% or less from saturated fats, daily to have 5 or more servings of fruits and vegetables.  Education: All About Nutrition: -Group instruction provided by verbal, written material, interactive activities, discussions, models, and posters to present general guidelines for heart healthy nutrition including fat, fiber, MyPlate, the role of sodium in heart healthy nutrition, utilization of the nutrition label, and utilization of this knowledge for meal planning. Follow up email sent as well. Written material given at graduation. Flowsheet Row Cardiac Rehab from 07/08/2020 in [facility] Cardiac and Pulmonary Rehab  Education need identified 07/08/20      Biometrics:  Pre Biometrics - 07/08/20 1025      Pre Biometrics   Height 6' 1.1" (1.857 m)    Weight 191 lb 1.6 oz (86.7 kg)    BMI (Calculated) 25.14    Single Leg Stand 30 seconds            Nutrition Therapy Plan  and Nutrition Goals:  Nutrition Therapy & Goals - 07/08/20 1043      Intervention Plan   Intervention Prescribe, educate and counsel regarding individualized specific dietary modifications aiming towards targeted core components such as weight, hypertension, lipid management, diabetes, heart failure and other comorbidities.    Expected Outcomes Short Term Goal: A plan has been developed with personal nutrition goals set during dietitian appointment.;Short Term Goal: Understand basic principles of dietary content, such as calories, fat, sodium, cholesterol and nutrients.;Long Term Goal: Adherence to prescribed nutrition plan.           Nutrition Assessments:  MEDIFICTS Score Key:  ?70 Need to make dietary changes   40-70 Heart Healthy Diet  ? 40 Therapeutic Level Cholesterol Diet  Flowsheet Row Cardiac Rehab from 07/08/2020 in [facility] Cardiac and Pulmonary Rehab  Picture Your Plate Total Score on Admission 67     Picture Your Plate Scores:  <37 Unhealthy dietary pattern with much room for improvement.  41-50 Dietary pattern unlikely to meet recommendations for good health and room for improvement.  51-60 More healthful dietary pattern, with some room for improvement.   >60 Healthy dietary pattern, although there may be some specific behaviors that could be improved.    Nutrition Goals Re-Evaluation:   Nutrition Goals Discharge (Final Nutrition Goals Re-Evaluation):   Psychosocial: Target Goals: Acknowledge presence or absence of significant depression and/or stress, maximize coping skills, provide positive support system. Participant is able to verbalize types and ability to use techniques and skills needed for reducing stress and depression.   Education: Stress, Anxiety, and Depression - Group verbal and visual presentation to define topics covered.  Reviews how body is impacted by stress, anxiety, and depression.  Also discusses healthy ways to reduce stress and to  treat/manage anxiety and depression.  Written material given at graduation.   Education: Sleep Hygiene -Provides group verbal and written instruction about how sleep can affect your health.  Define sleep hygiene, discuss sleep cycles and impact of sleep habits. Review good sleep hygiene tips.    Initial Review & Psychosocial Screening:  Initial Psych Review & Screening - 07/08/20 0736      Initial Review   Current issues with Current Stress Concerns    Source of Stress Concerns Occupation    Comments Already back work, no mental health history      [place]? Yes   wife and brother  Barriers   Psychosocial barriers to participate in program There are no identifiable barriers or psychosocial needs.      Screening Interventions   Interventions Encouraged to exercise;To provide support and resources with identified psychosocial needs;Provide feedback about the scores to participant    Expected Outcomes Short Term goal: Utilizing psychosocial counselor, staff and physician to assist with identification of specific Stressors or current issues interfering with healing process. Setting desired goal for each stressor or current issue identified.;Long Term Goal: Stressors or current issues are controlled or eliminated.;Short Term goal: Identification and review with participant of any Quality of Life or Depression concerns found by scoring the questionnaire.;Long Term goal: The participant improves quality of Life and PHQ9 Scores as seen by post scores and/or verbalization of changes           Quality of Life Scores:   Quality of Life - 07/08/20 1033      Quality of Life   Select Quality of Life      Quality of Life Scores   Health/Function Pre 24.8 %    Socioeconomic Pre 25.71 %    Psych/Spiritual Pre 23.79 %    Family Pre 27.6 %    GLOBAL Pre 25.19 %          Scores of 19 and below usually indicate a poorer quality of life in these areas.  A  difference of  2-3 points is a clinically meaningful difference.  A difference of 2-3 points in the total score of the Quality of Life Index has been associated with significant improvement in overall quality of life, self-image, physical symptoms, and general health in studies assessing change in quality of life.  PHQ-9: Recent Review Flowsheet Data    Depression screen [facility] 2/9 07/08/2020 12/25/2019 12/19/2018 09/27/2017 07/24/2013   Decreased Interest 0 0 0 0 0   Down, Depressed, Hopeless 0 0 0 0 0   PHQ - 2 Score 0 0 0 0 0   Altered sleeping 0 - - 0 -   Tired, decreased energy 1 - - 1 -   Change in appetite 0 - - 0 -   Feeling bad or failure about yourself  0 - - 0 -   Trouble concentrating 0 - - 0 -   Moving slowly or fidgety/restless 0 - - 0 -   Suicidal thoughts 0 - - 0 -   PHQ-9 Score 1 - - 1 -   Difficult doing work/chores Not difficult at all - - Not difficult at all -     Interpretation of Total Score  Total Score Depression Severity:  1-4 = Minimal depression, 5-9 = Mild depression, 10-14 = Moderate depression, 15-19 = Moderately severe depression, 20-27 = Severe depression   Psychosocial Evaluation and Intervention:  Psychosocial Evaluation - 07/08/20 1026      Psychosocial Evaluation & Interventions   Interventions Encouraged to exercise with the program and follow exercise prescription;Stress management education    Comments Pt is coming into rehab after a heart attack and stent.  He was completely caught off guard with it as he had no previous symptoms and works out with a Clinical research associate at the [employer] three days a week after work.  He does not have any previous heart history other than high blood pressure and lipids.  He has a history of thyroidectomy after thyroid cancer. He denies any history of depression and anxiety. He has great support in his wife and brother (who is also a cardiologist).  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 He is looking foward to learn his limits and how far to push and stress his body now.    Expected Outcomes Short: Attend rehab to boost confidence Long: Continue to stay positive.    Continue Psychosocial Services  Follow up required by staff           Psychosocial Re-Evaluation:   Psychosocial Discharge (Final Psychosocial Re-Evaluation):   Vocational Rehabilitation: Provide vocational rehab assistance to qualifying candidates.   Vocational Rehab Evaluation & Intervention:   Education: Education Goals: Education classes will be provided on a variety of topics geared toward better understanding of heart health and risk factor modification. Participant will state understanding/return demonstration of topics presented as noted by education test scores.  Learning Barriers/Preferences:  Learning Barriers/Preferences - 07/08/20 0736      Learning Barriers/Preferences   Learning Barriers Sight   glasses   Learning Preferences Skilled Demonstration           General Cardiac Education Topics:  AED/CPR: - Group verbal and written instruction with the use of models to demonstrate the basic use of the AED with the basic ABC's of resuscitation.   Anatomy and Cardiac Procedures: - Group verbal and visual presentation and models provide information about basic cardiac anatomy and function. Reviews the testing methods done to diagnose heart disease and the outcomes of the test results. Describes the treatment choices: Medical Management, Angioplasty, or Coronary Bypass Surgery for treating various heart conditions including Myocardial Infarction, Angina, Valve Disease, and Cardiac Arrhythmias.  Written material given at graduation.   Medication Safety: - Group verbal and visual instruction to review commonly prescribed medications for heart and lung disease. Reviews the medication, class of the drug, and side effects. Includes the steps to properly store meds and maintain the  prescription regimen.  Written material given at graduation.   Intimacy: - Group verbal instruction through game format to discuss how heart and lung disease can affect sexual intimacy. Written material given at graduation..   Know Your Numbers and Heart Failure: - Group verbal and visual instruction to discuss disease risk factors for cardiac and pulmonary disease and treatment options.  Reviews associated critical values for Overweight/Obesity, Hypertension, Cholesterol, and Diabetes.  Discusses basics of heart failure: signs/symptoms and treatments.  Introduces Heart Failure Zone chart for action plan for heart failure.  Written material given at graduation.   Infection Prevention: - Provides verbal and written material to individual with discussion of infection control including proper hand washing and proper equipment cleaning during exercise session. Flowsheet Row Cardiac Rehab from 07/08/2020 in [facility] Cardiac and Pulmonary Rehab  Date 07/08/20  Educator [facility]  Instruction Review Code 1- Verbalizes Understanding      Falls Prevention: - Provides verbal and written material to individual with discussion of falls prevention and safety. Flowsheet Row Cardiac Rehab from 07/08/2020 in [facility] Cardiac and Pulmonary Rehab  Date 07/08/20  Educator [facility]  Instruction Review Code 1- Verbalizes Understanding      Other: -Provides group and verbal instruction on various topics (see comments)   Knowledge Questionnaire Score:  Knowledge Questionnaire Score - 07/08/20 1044      Knowledge Questionnaire Score   Pre Score 23/26 Education Focus: Exercise, Nutrition           Core Components/Risk Factors/Patient Goals at Admission:  Personal Goals and Risk Factors at Admission - 07/08/20 1044      Core  Components/Risk Factors/Patient Goals on Admission    Weight Management Yes;Weight Loss    Intervention Weight Management: Develop a combined nutrition and exercise program designed to reach  desired caloric intake, while maintaining appropriate intake of nutrient and fiber, sodium and fats, and appropriate energy expenditure required for the weight goal.;Weight Management: Provide education and appropriate resources to help participant work on and attain dietary goals.;Weight Management/Obesity: Establish reasonable short term and long term weight goals.    Admit Weight 191 lb 1.6 oz (86.7 kg)    Goal Weight: Short Term 185 lb (83.9 kg)    Goal Weight: Long Term 185 lb (83.9 kg)    Expected Outcomes Short Term: Continue to assess and modify interventions until short term weight is achieved;Long Term: Adherence to nutrition and physical activity/exercise program aimed toward attainment of established weight goal;Weight Loss: Understanding of general recommendations for a balanced deficit meal plan, which promotes 1-2 lb weight loss per week and includes a negative energy balance of [phone] kcal/d;Understanding recommendations for meals to include 15-35% energy as protein, 25-35% energy from fat, 35-60% energy from carbohydrates, less than 200mg  of dietary cholesterol, 20-35 gm of total fiber daily;Understanding of distribution of calorie intake throughout the day with the consumption of 4-5 meals/snacks    Hypertension Yes    Intervention Provide education on lifestyle modifcations including regular physical activity/exercise, weight management, moderate sodium restriction and increased consumption of fresh fruit, vegetables, and low fat dairy, alcohol moderation, and smoking cessation.;Monitor prescription use compliance.    Expected Outcomes Short Term: Continued assessment and intervention until BP is < 140/7mm HG in hypertensive participants. < 130/75mm HG in hypertensive participants with diabetes, heart failure or chronic kidney disease.;Long Term: Maintenance of blood pressure at goal levels.    Lipids Yes    Intervention Provide education and support for participant on nutrition &  aerobic/resistive exercise along with prescribed medications to achieve LDL 70mg , HDL >40mg .    Expected Outcomes Long Term: Cholesterol controlled with medications as prescribed, with individualized exercise RX and with personalized nutrition plan. Value goals: LDL < 70mg , HDL > 40 mg.;Short Term: Participant states understanding of desired cholesterol values and is compliant with medications prescribed. Participant is following exercise prescription and nutrition guidelines.           Education:Diabetes - Individual verbal and written instruction to review signs/symptoms of diabetes, desired ranges of glucose level fasting, after meals and with exercise. Acknowledge that pre and post exercise glucose checks will be done for 3 sessions at entry of program.   Core Components/Risk Factors/Patient Goals Review:    Core Components/Risk Factors/Patient Goals at Discharge (Final Review):    ITP Comments:  ITP Comments    Row Name 07/08/20 1020           ITP Comments Completed 6MWT and gym orientation. Documentation for diagnosis can be found in [facility] encounter for 06/16/20.  Initial ITP created and sent for review to Dr. Emily Filbert, Medical Director.              Comments: Initial ITP

## 2020-07-08 NOTE — Patient Instructions (Signed)
Patient Instructions  Patient Details  Name: Matthew Kelley MRN: [id number] Date of Birth: [birth year] Referring Provider:  [name], MD  Below are your personal goals for exercise, nutrition, and risk factors. Our goal is to help you stay on track towards obtaining and maintaining these goals. We will be discussing your progress on these goals with you throughout the program.  Initial Exercise Prescription:  Initial Exercise Prescription - 07/08/20 1000      Date of Initial Exercise RX and Referring Provider   Date 07/08/20    Referring Provider Paraschos, Alexander MD      Treadmill   MPH 3.3    Grade 2    Minutes 15    METs 4.44      Elliptical   Level 1    Speed 4.1    Minutes 15    METs 4      REL-XR   Level 4    Speed 50    Minutes 15    METs 4      Prescription Details   Frequency (times per week) 3    Duration Progress to 30 minutes of continuous aerobic without signs/symptoms of physical distress      Intensity   THRR 40-80% of Max Heartrate 104-141    Ratings of Perceived Exertion 11-13    Perceived Dyspnea 0-4      Progression   Progression Continue to progress workloads to maintain intensity without signs/symptoms of physical distress.      Resistance Training   Training Prescription Yes    Weight 10 lb    Reps 10-15           Exercise Goals: Frequency: Be able to perform aerobic exercise two to three times per week in program working toward 2-5 days per week of home exercise.  Intensity: Work with a perceived exertion of 11 (fairly light) - 15 (hard) while following your exercise prescription.  We will make changes to your prescription with you as you progress through the program.   Duration: Be able to do 30 to 45 minutes of continuous aerobic exercise in addition to a 5 minute warm-up and a 5 minute cool-down routine.   Nutrition Goals: Your personal nutrition goals will be established when you do your nutrition analysis with the  dietician.  The following are general nutrition guidelines to follow: Cholesterol < 200mg /day Sodium < 1500mg /day Fiber: Men over 50 yrs - 30 grams per day  Personal Goals:  Personal Goals and Risk Factors at Admission - 07/08/20 1044      Core Components/Risk Factors/Patient Goals on Admission    Weight Management Yes;Weight Loss    Intervention Weight Management: Develop a combined nutrition and exercise program designed to reach desired caloric intake, while maintaining appropriate intake of nutrient and fiber, sodium and fats, and appropriate energy expenditure required for the weight goal.;Weight Management: Provide education and appropriate resources to help participant work on and attain dietary goals.;Weight Management/Obesity: Establish reasonable short term and long term weight goals.    Admit Weight 191 lb 1.6 oz (86.7 kg)    Goal Weight: Short Term 185 lb (83.9 kg)    Goal Weight: Long Term 185 lb (83.9 kg)    Expected Outcomes Short Term: Continue to assess and modify interventions until short term weight is achieved;Long Term: Adherence to nutrition and physical activity/exercise program aimed toward attainment of established weight goal;Weight Loss: Understanding of general recommendations for a balanced deficit meal plan, which promotes 1-2 lb weight loss  per week and includes a negative energy balance of [phone] kcal/d;Understanding recommendations for meals to include 15-35% energy as protein, 25-35% energy from fat, 35-60% energy from carbohydrates, less than 200mg  of dietary cholesterol, 20-35 gm of total fiber daily;Understanding of distribution of calorie intake throughout the day with the consumption of 4-5 meals/snacks    Hypertension Yes    Intervention Provide education on lifestyle modifcations including regular physical activity/exercise, weight management, moderate sodium restriction and increased consumption of fresh fruit, vegetables, and low fat dairy, alcohol  moderation, and smoking cessation.;Monitor prescription use compliance.    Expected Outcomes Short Term: Continued assessment and intervention until BP is < 140/72mm HG in hypertensive participants. < 130/71mm HG in hypertensive participants with diabetes, heart failure or chronic kidney disease.;Long Term: Maintenance of blood pressure at goal levels.    Lipids Yes    Intervention Provide education and support for participant on nutrition & aerobic/resistive exercise along with prescribed medications to achieve LDL 70mg , HDL >40mg .    Expected Outcomes Long Term: Cholesterol controlled with medications as prescribed, with individualized exercise RX and with personalized nutrition plan. Value goals: LDL < 70mg , HDL > 40 mg.;Short Term: Participant states understanding of desired cholesterol values and is compliant with medications prescribed. Participant is following exercise prescription and nutrition guidelines.           Tobacco Use Initial Evaluation: Social History   Tobacco Use  Smoking Status Never Smoker  Smokeless Tobacco Never Used    Exercise Goals and Review:  Exercise Goals    Row Name 07/08/20 1024             Exercise Goals   Increase Physical Activity Yes       Intervention Provide advice, education, support and counseling about physical activity/exercise needs.;Develop an individualized exercise prescription for aerobic and resistive training based on initial evaluation findings, risk stratification, comorbidities and participant's personal goals.       Expected Outcomes Short Term: Attend rehab on a regular basis to increase amount of physical activity.;Long Term: Add in home exercise to make exercise part of routine and to increase amount of physical activity.;Long Term: Exercising regularly at least 3-5 days a week.       Increase Strength and Stamina Yes       Intervention Provide advice, education, support and counseling about physical activity/exercise  needs.;Develop an individualized exercise prescription for aerobic and resistive training based on initial evaluation findings, risk stratification, comorbidities and participant's personal goals.       Expected Outcomes Short Term: Increase workloads from initial exercise prescription for resistance, speed, and METs.;Short Term: Perform resistance training exercises routinely during rehab and add in resistance training at home;Long Term: Improve cardiorespiratory fitness, muscular endurance and strength as measured by increased METs and functional capacity (6MWT)       Able to understand and use rate of perceived exertion (RPE) scale Yes       Intervention Provide education and explanation on how to use RPE scale       Expected Outcomes Short Term: Able to use RPE daily in rehab to express subjective intensity level;Long Term:  Able to use RPE to guide intensity level when exercising independently       Able to understand and use Dyspnea scale Yes       Intervention Provide education and explanation on how to use Dyspnea scale       Expected Outcomes Short Term: Able to use Dyspnea scale daily in rehab to express  subjective sense of shortness of breath during exertion;Long Term: Able to use Dyspnea scale to guide intensity level when exercising independently       Knowledge and understanding of Target Heart Rate Range (THRR) Yes       Intervention Provide education and explanation of THRR including how the numbers were predicted and where they are located for reference       Expected Outcomes Short Term: Able to state/look up THRR;Short Term: Able to use daily as guideline for intensity in rehab;Long Term: Able to use THRR to govern intensity when exercising independently       Able to check pulse independently Yes       Intervention Provide education and demonstration on how to check pulse in carotid and radial arteries.;Review the importance of being able to check your own pulse for safety during  independent exercise       Expected Outcomes Short Term: Able to explain why pulse checking is important during independent exercise;Long Term: Able to check pulse independently and accurately       Understanding of Exercise Prescription Yes       Intervention Provide education, explanation, and written materials on patient's individual exercise prescription       Expected Outcomes Short Term: Able to explain program exercise prescription;Long Term: Able to explain home exercise prescription to exercise independently              Copy of goals given to participant.

## 2020-07-11 ENCOUNTER — Encounter: Payer: 59 | Admitting: *Deleted

## 2020-07-11 ENCOUNTER — Other Ambulatory Visit: Payer: Self-pay

## 2020-07-11 DIAGNOSIS — Z955 Presence of coronary angioplasty implant and graft: Secondary | ICD-10-CM | POA: Diagnosis not present

## 2020-07-11 DIAGNOSIS — Z79899 Other long term (current) drug therapy: Secondary | ICD-10-CM | POA: Diagnosis not present

## 2020-07-11 DIAGNOSIS — I213 ST elevation (STEMI) myocardial infarction of unspecified site: Secondary | ICD-10-CM | POA: Diagnosis not present

## 2020-07-11 DIAGNOSIS — Z7901 Long term (current) use of anticoagulants: Secondary | ICD-10-CM | POA: Diagnosis not present

## 2020-07-11 DIAGNOSIS — Z7982 Long term (current) use of aspirin: Secondary | ICD-10-CM | POA: Diagnosis not present

## 2020-07-11 DIAGNOSIS — I2102 ST elevation (STEMI) myocardial infarction involving left anterior descending coronary artery: Secondary | ICD-10-CM

## 2020-07-11 NOTE — Progress Notes (Signed)
Daily Session Note  Patient Details  Name: Matthew Kelley MRN: [id number] Date of Birth: 03-23-1961 Referring Provider:   Flowsheet Row Cardiac Rehab from 07/08/2020 in [facility] Cardiac and Pulmonary Rehab  Referring Provider Matthew Cowman MD      Encounter Date: 07/11/2020  Check In:  Session Check In - 07/11/20 1720      Check-In   Supervising physician immediately available to respond to emergencies See telemetry face sheet for immediately available ER MD    Location [facility]-Cardiac & Pulmonary Rehab    Staff Present Matthew Papa, RN Matthew Kelley, BS, ACSM CEP, Exercise Physiologist;Matthew Eliezer Bottom, MS Exercise Physiologist    Virtual Visit No    Medication changes reported     No    Fall or balance concerns reported    No    Warm-up and Cool-down Performed on first and last piece of equipment    Resistance Training Performed Yes    VAD Patient? No    PAD/SET Patient? No      Pain Assessment   Currently in Pain? No/denies              Social History   Tobacco Use  Smoking Status Never Smoker  Smokeless Tobacco Never Used    Goals Met:  Independence with exercise equipment Exercise tolerated well No report of cardiac concerns or symptoms Strength training completed today  Goals Unmet:  Not Applicable  Comments: First full day of exercise!  Patient was oriented to gym and equipment including functions, settings, policies, and procedures.  Patient's individual exercise prescription and treatment plan were reviewed.  All starting workloads were established based on the results of the 6 minute walk test done at initial orientation visit.  The plan for exercise progression was also introduced and progression will be customized based on patient's performance and goals.    Dr. Emily Kelley is Medical Director for [place] and [facility].

## 2020-07-13 ENCOUNTER — Other Ambulatory Visit (HOSPITAL_COMMUNITY): Payer: Self-pay | Admitting: *Deleted

## 2020-07-13 ENCOUNTER — Other Ambulatory Visit: Payer: Self-pay

## 2020-07-13 DIAGNOSIS — Z7982 Long term (current) use of aspirin: Secondary | ICD-10-CM | POA: Diagnosis not present

## 2020-07-13 DIAGNOSIS — Z955 Presence of coronary angioplasty implant and graft: Secondary | ICD-10-CM

## 2020-07-13 DIAGNOSIS — Z79899 Other long term (current) drug therapy: Secondary | ICD-10-CM | POA: Diagnosis not present

## 2020-07-13 DIAGNOSIS — Z7901 Long term (current) use of anticoagulants: Secondary | ICD-10-CM | POA: Diagnosis not present

## 2020-07-13 DIAGNOSIS — I2102 ST elevation (STEMI) myocardial infarction involving left anterior descending coronary artery: Secondary | ICD-10-CM

## 2020-07-13 DIAGNOSIS — I213 ST elevation (STEMI) myocardial infarction of unspecified site: Secondary | ICD-10-CM | POA: Diagnosis not present

## 2020-07-13 MED ORDER — LISINOPRIL 5 MG PO TABS
ORAL_TABLET | ORAL | 3 refills | Status: DC
  Filled 2020-07-13: qty 90, 90d supply, fill #0
  Filled 2020-10-05: qty 90, 90d supply, fill #1

## 2020-07-13 MED ORDER — METOPROLOL TARTRATE 25 MG PO TABS
25.0000 mg | ORAL_TABLET | Freq: Two times a day (BID) | ORAL | 3 refills | Status: DC
  Filled 2020-07-13: qty 180, 90d supply, fill #0
  Filled 2020-10-05: qty 180, 90d supply, fill #1
  Filled 2021-01-17: qty 180, 90d supply, fill #2
  Filled 2021-04-26: qty 180, 90d supply, fill #3

## 2020-07-13 MED ORDER — BRILINTA 90 MG PO TABS
90.0000 mg | ORAL_TABLET | Freq: Two times a day (BID) | ORAL | 3 refills | Status: DC
  Filled 2020-07-13: qty 180, 90d supply, fill #0
  Filled 2020-10-05: qty 180, 90d supply, fill #1

## 2020-07-13 NOTE — Progress Notes (Signed)
Daily Session Note  Patient Details  Name: Matthew Kelley MRN: [id number] Date of Birth: 1960-06-13 Referring Provider:   Flowsheet Row Cardiac Rehab from 07/08/2020 in [facility] Cardiac and Pulmonary Rehab  Referring Provider [name] MD      Encounter Date: 07/13/2020  Check In:  Session Check In - 07/13/20 [place]      Check-In   Supervising physician immediately available to respond to emergencies See telemetry face sheet for immediately available ER MD    Location [facility]-Cardiac & Pulmonary Rehab    Staff Present Birdie Sons, MPA, RN;Joseph Lou Miner, MS Exercise Physiologist;Melissa Caiola RDN, LDN    Virtual Visit No    Medication changes reported     No    Fall or balance concerns reported    No    Warm-up and Cool-down Performed on first and last piece of equipment    Resistance Training Performed Yes    VAD Patient? No    PAD/SET Patient? No      Pain Assessment   Currently in Pain? No/denies              Social History   Tobacco Use  Smoking Status Never Smoker  Smokeless Tobacco Never Used    Goals Met:  Independence with exercise equipment Exercise tolerated well No report of cardiac concerns or symptoms Strength training completed today  Goals Unmet:  Not Applicable  Comments: Pt able to follow exercise prescription today without complaint.  Will continue to monitor for progression.    Dr. Emily Filbert is Medical Director for [place] and [facility].

## 2020-07-15 ENCOUNTER — Other Ambulatory Visit: Payer: Self-pay

## 2020-07-15 MED ORDER — ATORVASTATIN CALCIUM 10 MG PO TABS
1.0000 | ORAL_TABLET | Freq: Every day | ORAL | 3 refills | Status: DC
  Filled 2020-07-15: qty 90, 90d supply, fill #0

## 2020-07-18 ENCOUNTER — Other Ambulatory Visit: Payer: Self-pay

## 2020-07-18 ENCOUNTER — Encounter: Payer: 59 | Admitting: *Deleted

## 2020-07-18 DIAGNOSIS — Z955 Presence of coronary angioplasty implant and graft: Secondary | ICD-10-CM

## 2020-07-18 DIAGNOSIS — Z7901 Long term (current) use of anticoagulants: Secondary | ICD-10-CM | POA: Diagnosis not present

## 2020-07-18 DIAGNOSIS — I2102 ST elevation (STEMI) myocardial infarction involving left anterior descending coronary artery: Secondary | ICD-10-CM

## 2020-07-18 DIAGNOSIS — Z79899 Other long term (current) drug therapy: Secondary | ICD-10-CM | POA: Diagnosis not present

## 2020-07-18 DIAGNOSIS — I213 ST elevation (STEMI) myocardial infarction of unspecified site: Secondary | ICD-10-CM | POA: Diagnosis not present

## 2020-07-18 DIAGNOSIS — Z7982 Long term (current) use of aspirin: Secondary | ICD-10-CM | POA: Diagnosis not present

## 2020-07-18 NOTE — Progress Notes (Signed)
Daily Session Note  Patient Details  Name: Matthew Kelley MRN: [id number] Date of Birth: 17-Aug-1960 Referring Provider:   Flowsheet Row Cardiac Rehab from 07/08/2020 in [facility] Cardiac and Pulmonary Rehab  Referring Provider [name] MD      Encounter Date: 07/18/2020  Check In:  Session Check In - 07/18/20 1731      Check-In   Supervising physician immediately available to respond to emergencies See telemetry face sheet for immediately available ER MD    Location [facility]-Cardiac & Pulmonary Rehab    Staff Present Renita Papa, RN Margurite Auerbach, MS Exercise Physiologist;Kelly Amedeo Plenty, BS, ACSM CEP, Exercise Physiologist    Virtual Visit No    Medication changes reported     No    Fall or balance concerns reported    No    Warm-up and Cool-down Performed on first and last piece of equipment    Resistance Training Performed Yes    VAD Patient? No    PAD/SET Patient? No      Pain Assessment   Currently in Pain? No/denies              Social History   Tobacco Use  Smoking Status Never Smoker  Smokeless Tobacco Never Used    Goals Met:  Independence with exercise equipment Exercise tolerated well No report of cardiac concerns or symptoms Strength training completed today  Goals Unmet:  Not Applicable  Comments: Pt able to follow exercise prescription today without complaint.  Will continue to monitor for progression.    Dr. Emily Filbert is Medical Director for [place] and [facility].

## 2020-07-21 ENCOUNTER — Other Ambulatory Visit: Payer: Self-pay | Admitting: *Deleted

## 2020-07-21 NOTE — Patient Outreach (Addendum)
[facility]) Care Management  07/21/2020  Matthew Kelley 01-Jun-1960 [id number]   Transition of care /Case Closure Unsuccessful outreach    Referral received:06/21/20 Initial outreach:06/21/20 Insurance: [employer]   #4 Call Attempt  Unable to complete post hospital discharge transition of care assessment. No return call form patient after 3 call attempts and no response to request to contact RN Care Coordinator in unsuccessful outreach letter mailed to home on 06/21/20.  Objective: Per the electronic medical record,.[facility] pain, Acute ST elevation infarction, PCI of LAD and DES Comorbidities include: Hyperlipidemia, OSA with CPAP Plan Case closed to [employer] as unsuccessful call attempts x 4  Addendum Incoming call return call from Matthew Kelley, leaving a message to follow up on received voicemail message this am. Placed return call no answer able to leave a HIPAA compliant voicemail message for return call.   [name], RN, BSN  [place] Management Coordinator  [phone]- Mobile [phone]- Toll Free Main Office .

## 2020-07-22 ENCOUNTER — Other Ambulatory Visit: Payer: Self-pay

## 2020-07-26 ENCOUNTER — Other Ambulatory Visit: Payer: Self-pay

## 2020-07-26 MED ORDER — ATORVASTATIN CALCIUM 80 MG PO TABS
ORAL_TABLET | ORAL | 3 refills | Status: DC
  Filled 2020-07-26: qty 90, 90d supply, fill #0

## 2020-07-27 ENCOUNTER — Encounter: Payer: Self-pay | Admitting: *Deleted

## 2020-07-27 DIAGNOSIS — I2102 ST elevation (STEMI) myocardial infarction involving left anterior descending coronary artery: Secondary | ICD-10-CM

## 2020-07-27 DIAGNOSIS — Z955 Presence of coronary angioplasty implant and graft: Secondary | ICD-10-CM

## 2020-07-27 NOTE — Progress Notes (Signed)
Cardiac Individual Treatment Plan  Patient Details  Name: Matthew Kelley MRN: [id number] Date of Birth: 03-Mar-1961 Referring Provider:   Flowsheet Row Cardiac Rehab from 07/08/2020 in [facility] Cardiac and Pulmonary Rehab  Referring Provider [name] MD      Initial Encounter Date:  Flowsheet Row Cardiac Rehab from 07/08/2020 in [facility] Cardiac and Pulmonary Rehab  Date 07/08/20      Visit Diagnosis: ST elevation myocardial infarction involving left anterior descending (LAD) coronary artery [facility])  Status post coronary artery stent placement  Patient's Home Medications on Admission:  Current Outpatient Medications:  .  acetaminophen-codeine (TYLENOL #3) 300-30 MG tablet, TAKE 1 TO 2 TABLETS BY MOUTH EVERY 6 HOURS AS NEEDED FOR PAIN, Disp: 30 tablet, Rfl: 0 .  aspirin 81 MG chewable tablet, Chew 1 tablet (81 mg total) by mouth daily., Disp: 30 tablet, Rfl: 2 .  atorvastatin (LIPITOR) 10 MG tablet, Take 1 tablet (10 mg total) by mouth once daily, Disp: 90 tablet, Rfl: 3 .  atorvastatin (LIPITOR) 20 MG tablet, TAKE 1 TABLET BY MOUTH DAILY., Disp: 90 tablet, Rfl: 1 .  atorvastatin (LIPITOR) 80 MG tablet, Take 1 tablet (80 mg total) by mouth daily., Disp: 30 tablet, Rfl: 1 .  atorvastatin (LIPITOR) 80 MG tablet, Take 1 tablet (80 mg total) by mouth once daily, Disp: 90 tablet, Rfl: 3 .  BRILINTA 90 MG TABS tablet, Take 1 tablet (90 mg total) by mouth 2 (two) times daily, Disp: 180 tablet, Rfl: 3 .  cephALEXin (KEFLEX) 500 MG capsule, TAKE 1 CAPSULE BY MOUTH 2 TIMES DAILY FOR 5 DAYS, Disp: 10 capsule, Rfl: 0 .  cholecalciferol (VITAMIN D) 1000 units tablet, Take 1 tablet (1,000 Units total) by mouth daily., Disp: 90 tablet, Rfl: 1 .  cholecalciferol (VITAMIN D3) 25 MCG (1000 UNIT) tablet, Take 1,000 Units by mouth daily., Disp: , Rfl:  .  cycloSPORINE (RESTASIS) 0.05 % ophthalmic emulsion, INSTILL ONE DROP INTO EACH EYE TWO TIMES DAILY, Disp: 180 mL, Rfl: 4 .  diazepam (VALIUM) 5 MG tablet,  DO NOT TAKE, BRING TO OFFICE, FOR PROCEDURE ONLY, Disp: 2 tablet, Rfl: 0 .  finasteride (PROPECIA) 1 MG tablet, Take 1 mg by mouth daily., Disp: , Rfl:  .  finasteride (PROPECIA) 1 MG tablet, TAKE 1 TABLET BY MOUTH ONCE DAILY, Disp: 90 tablet, Rfl: 10 .  ketoconazole (NIZORAL) 2 % shampoo, Apply 1 application topically daily as needed., Disp: , Rfl:  .  ketoconazole (NIZORAL) 2 % shampoo, MASSAGE INTO SCALP ONCE DAILY AS NEEDED. LET SIT SEVERAL MINUTES BEFORE RINSING., Disp: 360 mL, Rfl: 3 .  levothyroxine (SYNTHROID) 175 MCG tablet, Take 175 mcg by mouth daily before breakfast., Disp: , Rfl:  .  levothyroxine (SYNTHROID, LEVOTHROID) 175 MCG tablet, Take 1 tablet (175 mcg total) by mouth daily before breakfast., Disp: 90 tablet, Rfl: 1 .  lisinopril (ZESTRIL) 5 MG tablet, Take 1 tablet (5 mg total) by mouth daily., Disp: 30 tablet, Rfl: 1 .  lisinopril (ZESTRIL) 5 MG tablet, Take 1 tablet (5 mg total) by mouth once daily, Disp: 90 tablet, Rfl: 3 .  metoprolol tartrate (LOPRESSOR) 25 MG tablet, Take 1 tablet (25 mg total) by mouth 2 (two) times daily., Disp: 60 tablet, Rfl: 1 .  metoprolol tartrate (LOPRESSOR) 25 MG tablet, Take 1 tablet (25 mg total) by mouth 2 (two) times daily, Disp: 180 tablet, Rfl: 3 .  Multiple Vitamin (MULTIVITAMIN WITH MINERALS) TABS tablet, Take 1 tablet by mouth daily. AREDS vitamin, Disp: , Rfl:  .  Multiple Vitamins-Minerals (PRESERVISION AREDS 2) CAPS, , Disp: , Rfl:  .  omeprazole (PRILOSEC) 40 MG capsule, Take 40 mg by mouth 2 (two) times daily., Disp: , Rfl:  .  omeprazole (PRILOSEC) 40 MG capsule, TAKE 1 CAPSULE BY MOUTH TWICE DAILY, Disp: 60 capsule, Rfl: 3 .  ondansetron (ZOFRAN) 4 MG tablet, TAKE 1 TO 2 TABLETS BY MOUTH BY MOUTH EVERY 6 HOURS AS NEEDED FOR NAUSEA, Disp: 20 tablet, Rfl: 0 .  RESTASIS 0.05 % ophthalmic emulsion, instill 1 drop into both eyes twice a day, Disp: , Rfl: 0 .  RESTASIS 0.05 % ophthalmic emulsion, Place 1 drop into both eyes 2 (two) times  daily., Disp: , Rfl:  .  ticagrelor (BRILINTA) 90 MG TABS tablet, Take 1 tablet (90 mg total) by mouth 2 (two) times daily., Disp: 60 tablet, Rfl: 1 .  valACYclovir (VALTREX) 1000 MG tablet, Take 1,000 mg by mouth 3 (three) times daily., Disp: , Rfl:  .  valACYclovir (VALTREX) 1000 MG tablet, Take 1,000 mg by mouth 3 (three) times daily., Disp: , Rfl:   Past Medical History: Past Medical History:  Diagnosis Date  . Blood in stool    h/o  . Cancer [facility])    thyroid cancer  . H/O total thyroidectomy   . History of chicken pox   . Hypercholesterolemia   . Hypothyroidism   . Sleep apnea   . Thyroid cancer ([place])   . Vertigo     Tobacco Use: Social History   Tobacco Use  Smoking Status Never Smoker  Smokeless Tobacco Never Used    Labs: Recent Review Flowsheet Data    Labs for ITP Cardiac and Pulmonary Rehab Latest Ref Rng & Units 01/21/2019 04/24/2019 07/17/2019 09/04/2019 12/25/2019   Cholestrol 0 - 200 mg/dL 166 192 147 162 135   LDLCALC 0 - 99 mg/dL 113 133(H) 91 102(H) 79   HDL >39.00 mg/dL 41 47.00 42 41.80 42.40   Trlycerides 0.0 - 149.0 mg/dL 58 61.0 68 90.0 66.0   Hemoglobin A1c 4.6 - 6.5 % - - - 5.7 -       Exercise Target Goals: Exercise Program Goal: Individual exercise prescription set using results from initial 6 min walk test and THRR while considering  patient's activity barriers and safety.   Exercise Prescription Goal: Initial exercise prescription builds to 30-45 minutes a day of aerobic activity, 2-3 days per week.  Home exercise guidelines will be given to patient during program as part of exercise prescription that the participant will acknowledge.   Education: Aerobic Exercise: - Group verbal and visual presentation on the components of exercise prescription. Introduces F.I.T.T principle from ACSM for exercise prescriptions.  Reviews F.I.T.T. principles of aerobic exercise including progression. Written material given at graduation. Flowsheet Row Cardiac  Rehab from 07/08/2020 in [facility] Cardiac and Pulmonary Rehab  Education need identified 07/08/20      Education: Resistance Exercise: - Group verbal and visual presentation on the components of exercise prescription. Introduces F.I.T.T principle from ACSM for exercise prescriptions  Reviews F.I.T.T. principles of resistance exercise including progression. Written material given at graduation.    Education: Exercise & Equipment Safety: - Individual verbal instruction and demonstration of equipment use and safety with use of the equipment. Flowsheet Row Cardiac Rehab from 07/08/2020 in [facility] Cardiac and Pulmonary Rehab  Date 07/08/20  Educator [facility]  Instruction Review Code 1- Verbalizes Understanding      Education: Exercise Physiology & General Exercise Guidelines: - Group verbal and written instruction with  models to review the exercise physiology of the cardiovascular system and associated critical values. Provides general exercise guidelines with specific guidelines to those with heart or lung disease.    Education: Flexibility, Balance, Mind/Body Relaxation: - Group verbal and visual presentation with interactive activity on the components of exercise prescription. Introduces F.I.T.T principle from ACSM for exercise prescriptions. Reviews F.I.T.T. principles of flexibility and balance exercise training including progression. Also discusses the mind body connection.  Reviews various relaxation techniques to help reduce and manage stress (i.e. Deep breathing, progressive muscle relaxation, and visualization). Balance handout provided to take home. Written material given at graduation.   Activity Barriers & Risk Stratification:  Activity Barriers & Cardiac Risk Stratification - 07/08/20 0736      Activity Barriers & Cardiac Risk Stratification   Activity Barriers Back Problems   occasional back pain   Cardiac Risk Stratification Moderate           6 Minute Walk:  6 Minute Walk    Row  Name 07/08/20 1020         6 Minute Walk   Phase Initial     Distance 1770 feet     Walk Time 6 minutes     # of Rest Breaks 0     MPH 3.35     METS 4.64     RPE 11     VO2 Peak 16.23     Symptoms No     Resting HR 67 bpm     Resting BP 132/56     Resting Oxygen Saturation  97 %     Exercise Oxygen Saturation  during 6 min walk 99 %     Max Ex. HR 112 bpm     Max Ex. BP 138/74     2 Minute Post BP 122/70            Oxygen Initial Assessment:   Oxygen Re-Evaluation:   Oxygen Discharge (Final Oxygen Re-Evaluation):   Initial Exercise Prescription:  Initial Exercise Prescription - 07/08/20 1000      Date of Initial Exercise RX and Referring Provider   Date 07/08/20    Referring Provider Paraschos, Alexander MD      Treadmill   MPH 3.3    Grade 2    Minutes 15    METs 4.44      Elliptical   Level 1    Speed 4.1    Minutes 15    METs 4      REL-XR   Level 4    Speed 50    Minutes 15    METs 4      Prescription Details   Frequency (times per week) 3    Duration Progress to 30 minutes of continuous aerobic without signs/symptoms of physical distress      Intensity   THRR 40-80% of Max Heartrate 104-141    Ratings of Perceived Exertion 11-13    Perceived Dyspnea 0-4      Progression   Progression Continue to progress workloads to maintain intensity without signs/symptoms of physical distress.      Resistance Training   Training Prescription Yes    Weight 10 lb    Reps 10-15           Perform Capillary Blood Glucose checks as needed.  Exercise Prescription Changes:  Exercise Prescription Changes    Row Name 07/08/20 1000 07/19/20 1300           Response to Exercise   Blood  Pressure (Admit) 132/56 128/60      Blood Pressure (Exercise) 138/74 152/82      Blood Pressure (Exit) 122/70 100/58      Heart Rate (Admit) 67 bpm 84 bpm      Heart Rate (Exercise) 112 bpm 118 bpm      Heart Rate (Exit) 67 bpm 93 bpm      Oxygen Saturation  (Admit) 97 % --      Oxygen Saturation (Exercise) 99 % --      Rating of Perceived Exertion (Exercise) 11 12      Symptoms none none      Comments walk test results third day      Duration -- Continue with 30 min of aerobic exercise without signs/symptoms of physical distress.      Intensity -- THRR unchanged             Progression   Progression -- Continue to progress workloads to maintain intensity without signs/symptoms of physical distress.      Average METs -- 6.35             Resistance Training   Training Prescription -- Yes      Weight -- 10 lb      Reps -- 10-15             Elliptical   Level -- 5      Minutes -- 15      METs -- 4.6             REL-XR   Level -- 12      Speed -- 50      Minutes -- 15      METs -- 8.1             Exercise Comments:   Exercise Goals and Review:  Exercise Goals    Row Name 07/08/20 1024             Exercise Goals   Increase Physical Activity Yes       Intervention Provide advice, education, support and counseling about physical activity/exercise needs.;Develop an individualized exercise prescription for aerobic and resistive training based on initial evaluation findings, risk stratification, comorbidities and participant's personal goals.       Expected Outcomes Short Term: Attend rehab on a regular basis to increase amount of physical activity.;Long Term: Add in home exercise to make exercise part of routine and to increase amount of physical activity.;Long Term: Exercising regularly at least 3-5 days a week.       Increase Strength and Stamina Yes       Intervention Provide advice, education, support and counseling about physical activity/exercise needs.;Develop an individualized exercise prescription for aerobic and resistive training based on initial evaluation findings, risk stratification, comorbidities and participant's personal goals.       Expected Outcomes Short Term: Increase workloads from initial exercise  prescription for resistance, speed, and METs.;Short Term: Perform resistance training exercises routinely during rehab and add in resistance training at home;Long Term: Improve cardiorespiratory fitness, muscular endurance and strength as measured by increased METs and functional capacity (6MWT)       Able to understand and use rate of perceived exertion (RPE) scale Yes       Intervention Provide education and explanation on how to use RPE scale       Expected Outcomes Short Term: Able to use RPE daily in rehab to express subjective intensity level;Long Term:  Able to use RPE to guide intensity level  when exercising independently       Able to understand and use Dyspnea scale Yes       Intervention Provide education and explanation on how to use Dyspnea scale       Expected Outcomes Short Term: Able to use Dyspnea scale daily in rehab to express subjective sense of shortness of breath during exertion;Long Term: Able to use Dyspnea scale to guide intensity level when exercising independently       Knowledge and understanding of Target Heart Rate Range (THRR) Yes       Intervention Provide education and explanation of THRR including how the numbers were predicted and where they are located for reference       Expected Outcomes Short Term: Able to state/look up THRR;Short Term: Able to use daily as guideline for intensity in rehab;Long Term: Able to use THRR to govern intensity when exercising independently       Able to check pulse independently Yes       Intervention Provide education and demonstration on how to check pulse in carotid and radial arteries.;Review the importance of being able to check your own pulse for safety during independent exercise       Expected Outcomes Short Term: Able to explain why pulse checking is important during independent exercise;Long Term: Able to check pulse independently and accurately       Understanding of Exercise Prescription Yes       Intervention Provide  education, explanation, and written materials on patient's individual exercise prescription       Expected Outcomes Short Term: Able to explain program exercise prescription;Long Term: Able to explain home exercise prescription to exercise independently              Exercise Goals Re-Evaluation :  Exercise Goals Re-Evaluation    Row Name 07/11/20 1721 07/19/20 1323           Exercise Goal Re-Evaluation   Exercise Goals Review Increase Physical Activity;Able to understand and use rate of perceived exertion (RPE) scale;Knowledge and understanding of Target Heart Rate Range (THRR);Understanding of Exercise Prescription;Increase Strength and Stamina;Able to check pulse independently Increase Physical Activity;Increase Strength and Stamina      Comments Reviewed RPE and dyspnea scales, THR and program prescription with pt today.  Pt voiced understanding and was given a copy of goals to take home. Jhaden is progressing well with exercise.  He does XR at level 12 and Elliptical at level 5.  Staff will monitor progress.      Expected Outcomes Short: Use RPE daily to regulate intensity. Long: Follow program prescription in THR. Short attend consistently Long: increase overall MET level             Discharge Exercise Prescription (Final Exercise Prescription Changes):  Exercise Prescription Changes - 07/19/20 1300      Response to Exercise   Blood Pressure (Admit) 128/60    Blood Pressure (Exercise) 152/82    Blood Pressure (Exit) 100/58    Heart Rate (Admit) 84 bpm    Heart Rate (Exercise) 118 bpm    Heart Rate (Exit) 93 bpm    Rating of Perceived Exertion (Exercise) 12    Symptoms none    Comments third day    Duration Continue with 30 min of aerobic exercise without signs/symptoms of physical distress.    Intensity THRR unchanged      Progression   Progression Continue to progress workloads to maintain intensity without signs/symptoms of physical distress.    Average  METs 6.35       Resistance Training   Training Prescription Yes    Weight 10 lb    Reps 10-15      Elliptical   Level 5    Minutes 15    METs 4.6      REL-XR   Level 12    Speed 50    Minutes 15    METs 8.1           Nutrition:  Target Goals: Understanding of nutrition guidelines, daily intake of sodium '1500mg'$ , cholesterol '200mg'$ , calories 30% from fat and 7% or less from saturated fats, daily to have 5 or more servings of fruits and vegetables.  Education: All About Nutrition: -Group instruction provided by verbal, written material, interactive activities, discussions, models, and posters to present general guidelines for heart healthy nutrition including fat, fiber, MyPlate, the role of sodium in heart healthy nutrition, utilization of the nutrition label, and utilization of this knowledge for meal planning. Follow up email sent as well. Written material given at graduation. Flowsheet Row Cardiac Rehab from 07/08/2020 in [facility] Cardiac and Pulmonary Rehab  Education need identified 07/08/20      Biometrics:  Pre Biometrics - 07/08/20 1025      Pre Biometrics   Height 6' 1.1" (1.857 m)    Weight 191 lb 1.6 oz (86.7 kg)    BMI (Calculated) 25.14    Single Leg Stand 30 seconds            Nutrition Therapy Plan and Nutrition Goals:  Nutrition Therapy & Goals - 07/08/20 1043      Intervention Plan   Intervention Prescribe, educate and counsel regarding individualized specific dietary modifications aiming towards targeted core components such as weight, hypertension, lipid management, diabetes, heart failure and other comorbidities.    Expected Outcomes Short Term Goal: A plan has been developed with personal nutrition goals set during dietitian appointment.;Short Term Goal: Understand basic principles of dietary content, such as calories, fat, sodium, cholesterol and nutrients.;Long Term Goal: Adherence to prescribed nutrition plan.           Nutrition Assessments:  MEDIFICTS Score  Key:  ?70 Need to make dietary changes   40-70 Heart Healthy Diet  ? 40 Therapeutic Level Cholesterol Diet  Flowsheet Row Cardiac Rehab from 07/08/2020 in [facility] Cardiac and Pulmonary Rehab  Picture Your Plate Total Score on Admission 67     Picture Your Plate Scores:  <35 Unhealthy dietary pattern with much room for improvement.  41-50 Dietary pattern unlikely to meet recommendations for good health and room for improvement.  51-60 More healthful dietary pattern, with some room for improvement.   >60 Healthy dietary pattern, although there may be some specific behaviors that could be improved.    Nutrition Goals Re-Evaluation:   Nutrition Goals Discharge (Final Nutrition Goals Re-Evaluation):   Psychosocial: Target Goals: Acknowledge presence or absence of significant depression and/or stress, maximize coping skills, provide positive support system. Participant is able to verbalize types and ability to use techniques and skills needed for reducing stress and depression.   Education: Stress, Anxiety, and Depression - Group verbal and visual presentation to define topics covered.  Reviews how body is impacted by stress, anxiety, and depression.  Also discusses healthy ways to reduce stress and to treat/manage anxiety and depression.  Written material given at graduation.   Education: Sleep Hygiene -Provides group verbal and written instruction about how sleep can affect your health.  Define sleep hygiene, discuss sleep cycles  and impact of sleep habits. Review good sleep hygiene tips.    Initial Review & Psychosocial Screening:  Initial Psych Review & Screening - 07/08/20 0736      Initial Review   Current issues with Current Stress Concerns    Source of Stress Concerns Occupation    Comments Already back work, no mental health history      [place]? Yes   wife and brother     Barriers   Psychosocial barriers to participate in program There  are no identifiable barriers or psychosocial needs.      Screening Interventions   Interventions Encouraged to exercise;To provide support and resources with identified psychosocial needs;Provide feedback about the scores to participant    Expected Outcomes Short Term goal: Utilizing psychosocial counselor, staff and physician to assist with identification of specific Stressors or current issues interfering with healing process. Setting desired goal for each stressor or current issue identified.;Long Term Goal: Stressors or current issues are controlled or eliminated.;Short Term goal: Identification and review with participant of any Quality of Life or Depression concerns found by scoring the questionnaire.;Long Term goal: The participant improves quality of Life and PHQ9 Scores as seen by post scores and/or verbalization of changes           Quality of Life Scores:   Quality of Life - 07/08/20 1033      Quality of Life   Select Quality of Life      Quality of Life Scores   Health/Function Pre 24.8 %    Socioeconomic Pre 25.71 %    Psych/Spiritual Pre 23.79 %    Family Pre 27.6 %    GLOBAL Pre 25.19 %          Scores of 19 and below usually indicate a poorer quality of life in these areas.  A difference of  2-3 points is a clinically meaningful difference.  A difference of 2-3 points in the total score of the Quality of Life Index has been associated with significant improvement in overall quality of life, self-image, physical symptoms, and general health in studies assessing change in quality of life.  PHQ-9: Recent Review Flowsheet Data    Depression screen [facility] 2/9 07/08/2020 12/25/2019 12/19/2018 09/27/2017 07/24/2013   Decreased Interest 0 0 0 0 0   Down, Depressed, Hopeless 0 0 0 0 0   PHQ - 2 Score 0 0 0 0 0   Altered sleeping 0 - - 0 -   Tired, decreased energy 1 - - 1 -   Change in appetite 0 - - 0 -   Feeling bad or failure about yourself  0 - - 0 -   Trouble concentrating 0  - - 0 -   Moving slowly or fidgety/restless 0 - - 0 -   Suicidal thoughts 0 - - 0 -   PHQ-9 Score 1 - - 1 -   Difficult doing work/chores Not difficult at all - - Not difficult at all -     Interpretation of Total Score  Total Score Depression Severity:  1-4 = Minimal depression, 5-9 = Mild depression, 10-14 = Moderate depression, 15-19 = Moderately severe depression, 20-27 = Severe depression   Psychosocial Evaluation and Intervention:  Psychosocial Evaluation - 07/08/20 1026      Psychosocial Evaluation & Interventions   Interventions Encouraged to exercise with the program and follow exercise prescription;Stress management education    Comments Pt is coming into rehab after a  heart attack and stent.  He was completely caught off guard with it as he had no previous symptoms and works out with a Clinical research associate at the [employer] three days a week after work.  He does not have any previous heart history other than high blood pressure and lipids.  He has a history of thyroidectomy after thyroid cancer. He denies any history of depression and anxiety. He has great support in his wife and brother (who is also a cardiologist).  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 He is looking foward to learn his limits and how far to push and stress his body now.    Expected Outcomes Short: Attend rehab to boost confidence Long: Continue to stay positive.    Continue Psychosocial Services  Follow up required by staff           Psychosocial Re-Evaluation:   Psychosocial Discharge (Final Psychosocial Re-Evaluation):   Vocational Rehabilitation: Provide vocational rehab assistance to qualifying candidates.   Vocational Rehab Evaluation & Intervention:   Education: Education Goals: Education classes will be provided on a variety of topics geared toward better understanding of heart health and risk factor modification. Participant will state  understanding/return demonstration of topics presented as noted by education test scores.  Learning Barriers/Preferences:  Learning Barriers/Preferences - 07/08/20 0736      Learning Barriers/Preferences   Learning Barriers Sight   glasses   Learning Preferences Skilled Demonstration           General Cardiac Education Topics:  AED/CPR: - Group verbal and written instruction with the use of models to demonstrate the basic use of the AED with the basic ABC's of resuscitation.   Anatomy and Cardiac Procedures: - Group verbal and visual presentation and models provide information about basic cardiac anatomy and function. Reviews the testing methods done to diagnose heart disease and the outcomes of the test results. Describes the treatment choices: Medical Management, Angioplasty, or Coronary Bypass Surgery for treating various heart conditions including Myocardial Infarction, Angina, Valve Disease, and Cardiac Arrhythmias.  Written material given at graduation.   Medication Safety: - Group verbal and visual instruction to review commonly prescribed medications for heart and lung disease. Reviews the medication, class of the drug, and side effects. Includes the steps to properly store meds and maintain the prescription regimen.  Written material given at graduation.   Intimacy: - Group verbal instruction through game format to discuss how heart and lung disease can affect sexual intimacy. Written material given at graduation..   Know Your Numbers and Heart Failure: - Group verbal and visual instruction to discuss disease risk factors for cardiac and pulmonary disease and treatment options.  Reviews associated critical values for Overweight/Obesity, Hypertension, Cholesterol, and Diabetes.  Discusses basics of heart failure: signs/symptoms and treatments.  Introduces Heart Failure Zone chart for action plan for heart failure.  Written material given at graduation.   Infection  Prevention: - Provides verbal and written material to individual with discussion of infection control including proper hand washing and proper equipment cleaning during exercise session. Flowsheet Row Cardiac Rehab from 07/08/2020 in [facility] Cardiac and Pulmonary Rehab  Date 07/08/20  Educator [facility]  Instruction Review Code 1- Verbalizes Understanding      Falls Prevention: - Provides verbal and written material to individual with discussion of falls prevention and safety. Flowsheet Row Cardiac Rehab from 07/08/2020 in [facility] Cardiac and Pulmonary Rehab  Date 07/08/20  Educator [facility]  Instruction Review Code 1- Verbalizes Understanding      Other: -Provides group and verbal instruction on various topics (see comments)   Knowledge Questionnaire Score:  Knowledge Questionnaire Score - 07/08/20 1044      Knowledge Questionnaire Score   Pre Score 23/26 Education Focus: Exercise, Nutrition           Core Components/Risk Factors/Patient Goals at Admission:  Personal Goals and Risk Factors at Admission - 07/08/20 1044      Core Components/Risk Factors/Patient Goals on Admission    Weight Management Yes;Weight Loss    Intervention Weight Management: Develop a combined nutrition and exercise program designed to reach desired caloric intake, while maintaining appropriate intake of nutrient and fiber, sodium and fats, and appropriate energy expenditure required for the weight goal.;Weight Management: Provide education and appropriate resources to help participant work on and attain dietary goals.;Weight Management/Obesity: Establish reasonable short term and long term weight goals.    Admit Weight 191 lb 1.6 oz (86.7 kg)    Goal Weight: Short Term 185 lb (83.9 kg)    Goal Weight: Long Term 185 lb (83.9 kg)    Expected Outcomes Short Term: Continue to assess and modify interventions until short term weight is achieved;Long Term: Adherence to nutrition and physical activity/exercise program aimed toward  attainment of established weight goal;Weight Loss: Understanding of general recommendations for a balanced deficit meal plan, which promotes 1-2 lb weight loss per week and includes a negative energy balance of [phone] kcal/d;Understanding recommendations for meals to include 15-35% energy as protein, 25-35% energy from fat, 35-60% energy from carbohydrates, less than 25m of dietary cholesterol, 20-35 gm of total fiber daily;Understanding of distribution of calorie intake throughout the day with the consumption of 4-5 meals/snacks    Hypertension Yes    Intervention Provide education on lifestyle modifcations including regular physical activity/exercise, weight management, moderate sodium restriction and increased consumption of fresh fruit, vegetables, and low fat dairy, alcohol moderation, and smoking cessation.;Monitor prescription use compliance.    Expected Outcomes Short Term: Continued assessment and intervention until BP is < 140/929mHG in hypertensive participants. < 130/8035mG in hypertensive participants with diabetes, heart failure or chronic kidney disease.;Long Term: Maintenance of blood pressure at goal levels.    Lipids Yes    Intervention Provide education and support for participant on nutrition & aerobic/resistive exercise along with prescribed medications to achieve LDL <46m50mDL >40mg54m Expected Outcomes Long Term: Cholesterol controlled with medications as prescribed, with individualized exercise RX and with personalized nutrition plan. Value goals: LDL < 46mg,69m > 40 mg.;Short Term: Participant states understanding of desired cholesterol values and is compliant with medications prescribed. Participant is following exercise prescription and nutrition guidelines.           Education:Diabetes - Individual verbal and written instruction to review signs/symptoms of diabetes, desired ranges of glucose level fasting, after meals and with exercise. Acknowledge that pre and post  exercise glucose checks will be done for 3 sessions at entry of program.   Core Components/Risk Factors/Patient Goals Review:    Core Components/Risk Factors/Patient Goals at Discharge (Final Review):    ITP Comments:  ITP Comments    Row Name 07/08/20 1020 07/11/20 1721 07/27/20 1009       ITP Comments Completed 6MWT and gym orientation. [facility].  Initial ITP created and sent for review to Dr. Mark MEmily Filbertcal Director. First full day of exercise!  Patient  was oriented to gym and equipment including functions, settings, policies, and procedures.  Patient's individual exercise prescription and treatment plan were reviewed.  All starting workloads were established based on the results of the 6 minute walk test done at initial orientation visit.  The plan for exercise progression was also introduced and progression will be customized based on patient's performance and goals. 30 Day review completed. Medical Director ITP review done, changes made as directed, and signed approval by Medical Director.  New to program            Comments:

## 2020-07-29 ENCOUNTER — Ambulatory Visit: Admission: RE | Admit: 2020-07-29 | Payer: 59 | Source: Ambulatory Visit

## 2020-08-02 ENCOUNTER — Other Ambulatory Visit: Payer: Self-pay

## 2020-08-02 DIAGNOSIS — I2510 Atherosclerotic heart disease of native coronary artery without angina pectoris: Secondary | ICD-10-CM | POA: Diagnosis not present

## 2020-08-02 MED ORDER — PRALUENT 75 MG/ML ~~LOC~~ SOAJ
SUBCUTANEOUS | 6 refills | Status: DC
  Filled 2020-08-02: qty 2, 28d supply, fill #0
  Filled 2020-08-26: qty 2, 28d supply, fill #1
  Filled 2020-09-30: qty 2, 28d supply, fill #2
  Filled 2020-10-28: qty 2, 28d supply, fill #3

## 2020-08-03 ENCOUNTER — Other Ambulatory Visit: Payer: Self-pay

## 2020-08-04 ENCOUNTER — Encounter: Payer: Self-pay | Admitting: *Deleted

## 2020-08-04 ENCOUNTER — Other Ambulatory Visit: Payer: Self-pay

## 2020-08-04 ENCOUNTER — Telehealth: Payer: Self-pay

## 2020-08-04 DIAGNOSIS — I2102 ST elevation (STEMI) myocardial infarction involving left anterior descending coronary artery: Secondary | ICD-10-CM

## 2020-08-04 DIAGNOSIS — Z955 Presence of coronary angioplasty implant and graft: Secondary | ICD-10-CM

## 2020-08-04 NOTE — Telephone Encounter (Signed)
Pt returned call and will be back to class next week

## 2020-08-04 NOTE — Telephone Encounter (Signed)
LMOM

## 2020-08-05 ENCOUNTER — Other Ambulatory Visit: Payer: Self-pay

## 2020-08-05 ENCOUNTER — Other Ambulatory Visit: Payer: Self-pay | Admitting: Internal Medicine

## 2020-08-05 MED ORDER — OMEPRAZOLE 40 MG PO CPDR
DELAYED_RELEASE_CAPSULE | Freq: Two times a day (BID) | ORAL | 3 refills | Status: DC
  Filled 2020-08-05: qty 60, 30d supply, fill #0

## 2020-08-05 MED FILL — Cyclosporine (Ophth) Emulsion 0.05%: OPHTHALMIC | 90 days supply | Qty: 180 | Fill #0 | Status: AC

## 2020-08-05 MED FILL — Finasteride Tab 1 MG: ORAL | 90 days supply | Qty: 90 | Fill #0 | Status: AC

## 2020-08-08 ENCOUNTER — Other Ambulatory Visit: Payer: Self-pay

## 2020-08-08 ENCOUNTER — Encounter: Payer: 59 | Attending: Cardiology | Admitting: *Deleted

## 2020-08-08 DIAGNOSIS — Z955 Presence of coronary angioplasty implant and graft: Secondary | ICD-10-CM | POA: Insufficient documentation

## 2020-08-08 DIAGNOSIS — I2102 ST elevation (STEMI) myocardial infarction involving left anterior descending coronary artery: Secondary | ICD-10-CM | POA: Diagnosis not present

## 2020-08-08 NOTE — Progress Notes (Signed)
Daily Session Note  Patient Details  Name: Tykel Badie MRN: [id number] Date of Birth: 1961-01-16 Referring Provider:   Flowsheet Row Cardiac Rehab from 07/08/2020 in [facility] Cardiac and Pulmonary Rehab  Referring Provider [name] MD      Encounter Date: 08/08/2020  Check In:  Session Check In - 08/08/20 1728      Check-In   Supervising physician immediately available to respond to emergencies See telemetry face sheet for immediately available ER MD    Location [facility]-Cardiac & Pulmonary Rehab    Staff Present Earlean Shawl, BS, ACSM CEP, Exercise Physiologist;Teige Rountree Sherryll Burger, RN Margurite Auerbach, MS Exercise Physiologist    Virtual Visit No    Medication changes reported     Yes    Comments off lipitor and on praluent    Fall or balance concerns reported    No    Warm-up and Cool-down Performed on first and last piece of equipment    Resistance Training Performed Yes    VAD Patient? No    PAD/SET Patient? No      Pain Assessment   Currently in Pain? No/denies              Social History   Tobacco Use  Smoking Status Never Smoker  Smokeless Tobacco Never Used    Goals Met:  Independence with exercise equipment Exercise tolerated well No report of cardiac concerns or symptoms Strength training completed today  Goals Unmet:  Not Applicable  Comments: Pt able to follow exercise prescription today without complaint.  Will continue to monitor for progression.    Dr. Emily Filbert is Medical Director for [place] and [facility].

## 2020-08-10 ENCOUNTER — Other Ambulatory Visit: Payer: Self-pay

## 2020-08-10 DIAGNOSIS — I2102 ST elevation (STEMI) myocardial infarction involving left anterior descending coronary artery: Secondary | ICD-10-CM | POA: Diagnosis not present

## 2020-08-10 DIAGNOSIS — Z955 Presence of coronary angioplasty implant and graft: Secondary | ICD-10-CM

## 2020-08-10 NOTE — Progress Notes (Signed)
Daily Session Note  Patient Details  Name: Matthew Kelley MRN: [id number] Date of Birth: [birth year] Referring Provider:   Flowsheet Row Cardiac Rehab from 07/08/2020 in [facility] Cardiac and Pulmonary Rehab  Referring Provider [name] MD      Encounter Date: 08/10/2020  Check In:  Session Check In - 08/10/20 1738      Check-In   Supervising physician immediately available to respond to emergencies See telemetry face sheet for immediately available ER MD    Location [facility]-Cardiac & Pulmonary Rehab    Staff Present Birdie Sons, MPA, RN;Joseph Lou Miner, MS Exercise Physiologist;Melissa Caiola RDN, LDN    Virtual Visit No    Medication changes reported     No    Fall or balance concerns reported    No    Warm-up and Cool-down Performed on first and last piece of equipment    Resistance Training Performed Yes    VAD Patient? No    PAD/SET Patient? No      Pain Assessment   Currently in Pain? No/denies              Social History   Tobacco Use  Smoking Status Never Smoker  Smokeless Tobacco Never Used    Goals Met:  Independence with exercise equipment Exercise tolerated well No report of cardiac concerns or symptoms Strength training completed today  Goals Unmet:  Not Applicable  Comments: Pt able to follow exercise prescription today without complaint.  Will continue to monitor for progression.    Dr. Emily Filbert is Medical Director for [place] and [facility].

## 2020-08-11 ENCOUNTER — Encounter: Payer: 59 | Admitting: *Deleted

## 2020-08-11 DIAGNOSIS — Z955 Presence of coronary angioplasty implant and graft: Secondary | ICD-10-CM | POA: Diagnosis not present

## 2020-08-11 DIAGNOSIS — I2102 ST elevation (STEMI) myocardial infarction involving left anterior descending coronary artery: Secondary | ICD-10-CM

## 2020-08-11 NOTE — Progress Notes (Signed)
Daily Session Note  Patient Details  Name: Matthew Kelley MRN: [id number] Date of Birth: Dec 16, 1960 Referring Provider:   Flowsheet Row Cardiac Rehab from 07/08/2020 in [facility] Cardiac and Pulmonary Rehab  Referring Provider [name] MD      Encounter Date: 08/11/2020  Check In:  Session Check In - 08/11/20 1737      Check-In   Supervising physician immediately available to respond to emergencies See telemetry face sheet for immediately available ER MD    Location [facility]-Cardiac & Pulmonary Rehab    Staff Present Renita Papa, RN BSN;Joseph Lou Miner, [place] Exercise Physiologist    Virtual Visit No    Medication changes reported     No    Warm-up and Cool-down Performed on first and last piece of equipment    Resistance Training Performed Yes    VAD Patient? No    PAD/SET Patient? No      Pain Assessment   Currently in Pain? No/denies              Social History   Tobacco Use  Smoking Status Never Smoker  Smokeless Tobacco Never Used    Goals Met:  Independence with exercise equipment Exercise tolerated well No report of cardiac concerns or symptoms Strength training completed today  Goals Unmet:  Not Applicable  Comments: Pt able to follow exercise prescription today without complaint.  Will continue to monitor for progression.    Dr. Emily Filbert is Medical Director for [place] and [facility].

## 2020-08-12 ENCOUNTER — Encounter: Payer: Self-pay | Admitting: Internal Medicine

## 2020-08-12 ENCOUNTER — Ambulatory Visit: Payer: 59 | Admitting: Internal Medicine

## 2020-08-12 ENCOUNTER — Other Ambulatory Visit: Payer: Self-pay

## 2020-08-12 VITALS — BP 100/62 | HR 68 | Temp 97.5°F | Ht 73.11 in | Wt 190.6 lb

## 2020-08-12 DIAGNOSIS — Z85850 Personal history of malignant neoplasm of thyroid: Secondary | ICD-10-CM

## 2020-08-12 DIAGNOSIS — I25119 Atherosclerotic heart disease of native coronary artery with unspecified angina pectoris: Secondary | ICD-10-CM | POA: Diagnosis not present

## 2020-08-12 DIAGNOSIS — D509 Iron deficiency anemia, unspecified: Secondary | ICD-10-CM | POA: Diagnosis not present

## 2020-08-12 DIAGNOSIS — G4733 Obstructive sleep apnea (adult) (pediatric): Secondary | ICD-10-CM | POA: Diagnosis not present

## 2020-08-12 DIAGNOSIS — E7800 Pure hypercholesterolemia, unspecified: Secondary | ICD-10-CM

## 2020-08-12 LAB — BASIC METABOLIC PANEL
BUN: 21 mg/dL (ref 6–23)
CO2: 29 mEq/L (ref 19–32)
Calcium: 8.8 mg/dL (ref 8.4–10.5)
Chloride: 107 mEq/L (ref 96–112)
Creatinine, Ser: 1.13 mg/dL (ref 0.40–1.50)
GFR: 69.95 mL/min (ref >60.00)
Glucose, Bld: 99 mg/dL (ref 70–99)
Potassium: 4.6 mEq/L (ref 3.5–5.1)
Sodium: 142 mEq/L (ref 135–145)

## 2020-08-12 LAB — CBC WITH DIFFERENTIAL/PLATELET
Basophils Absolute: 0.1 10*3/uL (ref 0.0–0.1)
Basophils Relative: 1.9 % (ref 0.0–3.0)
Eosinophils Absolute: 0.3 10*3/uL (ref 0.0–0.7)
Eosinophils Relative: 7.3 % — ABNORMAL HIGH (ref 0.0–5.0)
HCT: 32.1 % — ABNORMAL LOW (ref 39.0–52.0)
Hemoglobin: 10.9 g/dL — ABNORMAL LOW (ref 13.0–17.0)
Lymphocytes Relative: 28.1 % (ref 12.0–46.0)
Lymphs Abs: 1 10*3/uL (ref 0.7–4.0)
MCHC: 33.9 g/dL (ref 30.0–36.0)
MCV: 81.7 fl (ref 78.0–100.0)
Monocytes Absolute: 0.4 10*3/uL (ref 0.1–1.0)
Monocytes Relative: 11.1 % (ref 3.0–12.0)
Neutro Abs: 1.8 10*3/uL (ref 1.4–7.7)
Neutrophils Relative %: 51.6 % (ref 43.0–77.0)
Platelets: 204 10*3/uL (ref 150.0–400.0)
RBC: 3.93 Mil/uL — ABNORMAL LOW (ref 4.22–5.81)
RDW: 15.4 % (ref 11.5–15.5)
WBC: 3.5 10*3/uL — ABNORMAL LOW (ref 4.0–10.5)

## 2020-08-12 LAB — LIPID PANEL
Cholesterol: 167 mg/dL (ref 0–200)
HDL: 42.6 mg/dL (ref >39.00)
LDL Cholesterol: 112 mg/dL — ABNORMAL HIGH (ref 0–99)
NonHDL: 124.4
Total CHOL/HDL Ratio: 4
Triglycerides: 62 mg/dL (ref 0.0–149.0)
VLDL: 12.4 mg/dL (ref 0.0–40.0)

## 2020-08-12 LAB — HEPATIC FUNCTION PANEL
ALT: 13 U/L (ref 0–53)
AST: 12 U/L (ref 0–37)
Albumin: 4.2 g/dL (ref 3.5–5.2)
Alkaline Phosphatase: 80 U/L (ref 39–117)
Bilirubin, Direct: 0.1 mg/dL (ref 0.0–0.3)
Total Bilirubin: 0.4 mg/dL (ref 0.2–1.2)
Total Protein: 6.4 g/dL (ref 6.0–8.3)

## 2020-08-12 LAB — VITAMIN B12: Vitamin B-12: 654 pg/mL (ref 211–911)

## 2020-08-12 LAB — FERRITIN: Ferritin: 7.1 ng/mL — ABNORMAL LOW (ref 22.0–322.0)

## 2020-08-12 LAB — TSH: TSH: <0.01 u[IU]/mL — ABNORMAL LOW (ref 0.35–4.50)

## 2020-08-12 MED ORDER — OMEPRAZOLE 20 MG PO CPDR
20.0000 mg | DELAYED_RELEASE_CAPSULE | Freq: Every day | ORAL | 3 refills | Status: DC
  Filled 2020-08-12: qty 90, 90d supply, fill #0

## 2020-08-12 NOTE — Assessment & Plan Note (Signed)
Followed by Dr Forde Dandy.  On synthroid.  Check tsh.

## 2020-08-12 NOTE — Assessment & Plan Note (Signed)
Recent admission with anterior STEMI s/p PCI/DES of proximal LAD.  Continue Brilinta and metoprolol.  No chest pain now.  Breathing stable.  Continue cardiac rehab.

## 2020-08-12 NOTE — Assessment & Plan Note (Addendum)
Recently had EGD and colonoscopy.  Check cbc and ferritin.  Has been on prilosec.  Initially on 40mg  bid.  Recently on 40mg  q day (for the last year).  Decrease to 20mg  q day.  Currently asymptomatic.  Follow.

## 2020-08-12 NOTE — Assessment & Plan Note (Signed)
Continue cpap.  

## 2020-08-12 NOTE — Progress Notes (Signed)
Patient ID: Matthew Kelley, male   DOB: 04-29-1960, 60 y.o.   MRN: [id number]   Subjective:    Patient ID: Matthew Kelley, male    DOB: 10-17-1960, 60 y.o.   MRN: [id number]  HPI This visit occurred during the SARS-CoV-2 public health emergency.  Safety protocols were in place, including screening questions prior to the visit, additional usage of staff PPE, and extensive cleaning of exam room while observing appropriate contact time as indicated for disinfecting solutions.  Patient here for a scheduled follow up.  Was recently admitted with anterior STEMI s/p PCI/DES proximal LAD.  EF 40-45%.  On aspirin and brilinta now.  Going to cardiac rehab.  No chest pain. Works out with a Physiological scientist after cardiac rehab.  No sob.  No acid reflux.  No abdominal pain or bowel change reported. No bleeding.  Currently taking prilosec 40mg  q day.  No upper symptoms.  Discussed decreasing dose to 20mg  q day. Handling stress.   Past Medical History:  Diagnosis Date  . Blood in stool    h/o  . Cancer [facility])    thyroid cancer  . H/O total thyroidectomy   . History of chicken pox   . Hypercholesterolemia   . Hypothyroidism   . Sleep apnea   . Thyroid cancer ([place])   . Vertigo    Past Surgical History:  Procedure Laterality Date  . CHOLECYSTECTOMY  2000  . CORONARY/GRAFT ACUTE MI REVASCULARIZATION N/A 06/16/2020   Procedure: Coronary/Graft Acute MI Revascularization;  Surgeon: [name], MD;  Location: [facility];  Service: Cardiovascular;  Laterality: N/A;  . LEFT HEART CATH AND CORONARY ANGIOGRAPHY N/A 06/16/2020   Procedure: LEFT HEART CATH AND CORONARY ANGIOGRAPHY;  Surgeon: [name], MD;  Location: [facility];  Service: Cardiovascular;  Laterality: N/A;  . TOTAL THYROIDECTOMY  2009   Family History  Problem Relation Age of Onset  . Hyperlipidemia Father   . Heart disease Father    Social History   Socioeconomic History  . Marital status: Married     Spouse name: Not on file  . Number of children: Not on file  . Years of education: Not on file  . Highest education level: Not on file  Occupational History  . Not on file  Tobacco Use  . Smoking status: Never Smoker  . Smokeless tobacco: Never Used  Vaping Use  . Vaping Use: Never used  Substance and Sexual Activity  . Alcohol use: Yes    Alcohol/week: 0.0 standard drinks  . Drug use: No  . Sexual activity: Not on file  Other Topics Concern  . Not on file  Social History Narrative   ** Merged History Encounter **       Social Determinants of Health   Financial Resource Strain: Not on file  Food Insecurity: Not on file  Transportation Needs: Not on file  Physical Activity: Not on file  Stress: Not on file  Social Connections: Not on file    Outpatient Encounter Medications as of 08/12/2020  Medication Sig  . Alirocumab (PRALUENT) 75 MG/ML SOAJ Inject 75 mg subcutaneously every 14 (fourteen) days  . aspirin 81 MG chewable tablet Chew 1 tablet (81 mg total) by mouth daily.  Marland Kitchen BRILINTA 90 MG TABS tablet Take 1 tablet (90 mg total) by mouth 2 (two) times daily  . cholecalciferol (VITAMIN D) 1000 units tablet Take 1 tablet (1,000 Units total) by mouth daily.  . cycloSPORINE (RESTASIS) 0.05 % ophthalmic emulsion INSTILL ONE  DROP INTO EACH EYE TWO TIMES DAILY  . finasteride (PROPECIA) 1 MG tablet TAKE 1 TABLET BY MOUTH ONCE DAILY  . ketoconazole (NIZORAL) 2 % shampoo MASSAGE INTO SCALP ONCE DAILY AS NEEDED. LET SIT SEVERAL MINUTES BEFORE RINSING.  Marland Kitchen levothyroxine (SYNTHROID) 175 MCG tablet Take 175 mcg by mouth daily before breakfast.  . lisinopril (ZESTRIL) 5 MG tablet Take 1 tablet (5 mg total) by mouth once daily  . metoprolol tartrate (LOPRESSOR) 25 MG tablet Take 1 tablet (25 mg total) by mouth 2 (two) times daily  . Multiple Vitamins-Minerals (PRESERVISION AREDS 2) CAPS   . omeprazole (PRILOSEC) 20 MG capsule Take 1 capsule (20 mg total) by mouth daily.  . [DISCONTINUED]  omeprazole (PRILOSEC) 40 MG capsule TAKE 1 CAPSULE BY MOUTH TWICE DAILY  . ticagrelor (BRILINTA) 90 MG TABS tablet Take 1 tablet (90 mg total) by mouth 2 (two) times daily. (Patient not taking: Reported on 08/12/2020)  . [DISCONTINUED] acetaminophen-codeine (TYLENOL #3) 300-30 MG tablet TAKE 1 TO 2 TABLETS BY MOUTH EVERY 6 HOURS AS NEEDED FOR PAIN (Patient not taking: Reported on 08/12/2020)  . [DISCONTINUED] atorvastatin (LIPITOR) 10 MG tablet Take 1 tablet (10 mg total) by mouth once daily (Patient not taking: Reported on 08/12/2020)  . [DISCONTINUED] atorvastatin (LIPITOR) 20 MG tablet TAKE 1 TABLET BY MOUTH DAILY. (Patient not taking: Reported on 08/12/2020)  . [DISCONTINUED] atorvastatin (LIPITOR) 80 MG tablet Take 1 tablet (80 mg total) by mouth daily. (Patient not taking: Reported on 08/12/2020)  . [DISCONTINUED] atorvastatin (LIPITOR) 80 MG tablet Take 1 tablet (80 mg total) by mouth once daily (Patient not taking: Reported on 08/12/2020)  . [DISCONTINUED] cephALEXin (KEFLEX) 500 MG capsule TAKE 1 CAPSULE BY MOUTH 2 TIMES DAILY FOR 5 DAYS (Patient not taking: Reported on 08/12/2020)  . [DISCONTINUED] cholecalciferol (VITAMIN D3) 25 MCG (1000 UNIT) tablet Take 1,000 Units by mouth daily. (Patient not taking: Reported on 08/12/2020)  . [DISCONTINUED] diazepam (VALIUM) 5 MG tablet DO NOT TAKE, BRING TO OFFICE, FOR PROCEDURE ONLY (Patient not taking: Reported on 08/12/2020)  . [DISCONTINUED] finasteride (PROPECIA) 1 MG tablet Take 1 mg by mouth daily. (Patient not taking: Reported on 08/12/2020)  . [DISCONTINUED] ketoconazole (NIZORAL) 2 % shampoo Apply 1 application topically daily as needed. (Patient not taking: Reported on 08/12/2020)  . [DISCONTINUED] levothyroxine (SYNTHROID, LEVOTHROID) 175 MCG tablet Take 1 tablet (175 mcg total) by mouth daily before breakfast. (Patient not taking: Reported on 08/12/2020)  . [DISCONTINUED] lisinopril (ZESTRIL) 5 MG tablet Take 1 tablet (5 mg total) by mouth daily. (Patient not  taking: Reported on 08/12/2020)  . [DISCONTINUED] metoprolol tartrate (LOPRESSOR) 25 MG tablet Take 1 tablet (25 mg total) by mouth 2 (two) times daily. (Patient not taking: Reported on 08/12/2020)  . [DISCONTINUED] Multiple Vitamin (MULTIVITAMIN WITH MINERALS) TABS tablet Take 1 tablet by mouth daily. AREDS vitamin (Patient not taking: Reported on 08/12/2020)  . [DISCONTINUED] omeprazole (PRILOSEC) 40 MG capsule Take 40 mg by mouth 2 (two) times daily. (Patient not taking: Reported on 08/12/2020)  . [DISCONTINUED] ondansetron (ZOFRAN) 4 MG tablet TAKE 1 TO 2 TABLETS BY MOUTH BY MOUTH EVERY 6 HOURS AS NEEDED FOR NAUSEA (Patient not taking: Reported on 08/12/2020)  . [DISCONTINUED] RESTASIS 0.05 % ophthalmic emulsion instill 1 drop into both eyes twice a day (Patient not taking: Reported on 08/12/2020)  . [DISCONTINUED] RESTASIS 0.05 % ophthalmic emulsion Place 1 drop into both eyes 2 (two) times daily. (Patient not taking: Reported on 08/12/2020)  . [DISCONTINUED] valACYclovir (VALTREX) 1000 MG  tablet Take 1,000 mg by mouth 3 (three) times daily. (Patient not taking: Reported on 08/12/2020)  . [DISCONTINUED] valACYclovir (VALTREX) 1000 MG tablet Take 1,000 mg by mouth 3 (three) times daily. (Patient not taking: Reported on 08/12/2020)   No facility-administered encounter medications on file as of 08/12/2020.    Review of Systems  Constitutional: Negative for appetite change and unexpected weight change.  HENT: Negative for congestion and sinus pressure.   Respiratory: Negative for cough, chest tightness and shortness of breath.   Cardiovascular: Negative for chest pain, palpitations and leg swelling.  Gastrointestinal: Negative for abdominal pain, diarrhea, nausea and vomiting.  Genitourinary: Negative for difficulty urinating and dysuria.  Musculoskeletal: Negative for joint swelling and myalgias.  Skin: Negative for color change and rash.  Neurological: Negative for dizziness, light-headedness and headaches.   Psychiatric/Behavioral: Negative for agitation and dysphoric mood.       Objective:    Physical Exam Vitals reviewed.  Constitutional:      General: He is not in acute distress.    Appearance: Normal appearance. He is well-developed.  HENT:     Head: Normocephalic and atraumatic.     Right Ear: External ear normal.     Left Ear: External ear normal.  Eyes:     General: No scleral icterus.       Right eye: No discharge.        Left eye: No discharge.     Conjunctiva/sclera: Conjunctivae normal.  Cardiovascular:     Rate and Rhythm: Normal rate and regular rhythm.  Pulmonary:     Effort: Pulmonary effort is normal. No respiratory distress.     Breath sounds: Normal breath sounds.  Abdominal:     General: Bowel sounds are normal.     Palpations: Abdomen is soft.     Tenderness: There is no abdominal tenderness.  Musculoskeletal:        General: No swelling or tenderness.     Cervical back: Neck supple. No tenderness.  Lymphadenopathy:     Cervical: No cervical adenopathy.  Skin:    Findings: No erythema or rash.  Neurological:     Mental Status: He is alert.  Psychiatric:        Mood and Affect: Mood normal.        Behavior: Behavior normal.     BP 100/62 (BP Location: Left Arm, Patient Position: Sitting)   Pulse 68   Temp (!) 97.5 F (36.4 C)   Ht 6' 1.11" (1.857 m)   Wt 190 lb 9.6 oz (86.5 kg)   SpO2 99%   BMI 25.07 kg/m  Wt Readings from Last 3 Encounters:  08/12/20 190 lb 9.6 oz (86.5 kg)  07/08/20 191 lb 1.6 oz (86.7 kg)  06/16/20 163 lb 5.8 oz (74.1 kg)     Lab Results  Component Value Date   WBC 3.5 (L) 08/12/2020   HGB 10.9 (L) 08/12/2020   HCT 32.1 (L) 08/12/2020   PLT 204.0 08/12/2020   GLUCOSE 99 08/12/2020   CHOL 167 08/12/2020   TRIG 62.0 08/12/2020   HDL 42.60 08/12/2020   LDLCALC 112 (H) 08/12/2020   ALT 13 08/12/2020   AST 12 08/12/2020   NA 142 08/12/2020   K 4.6 08/12/2020   CL 107 08/12/2020   CREATININE 1.13 08/12/2020    BUN 21 08/12/2020   CO2 29 08/12/2020   TSH <0.01 Repeated and verified X2. (L) 08/12/2020   PSA 0.16 12/25/2019   INR 1.0 06/16/2020   HGBA1C  5.7 09/04/2019    ECHOCARDIOGRAM COMPLETE  Result Date: 06/17/2020    ECHOCARDIOGRAM REPORT   Patient Name:   ASCENCION COYE Date of Exam: 06/17/2020 Medical Rec #:  [id number]        Height:       73.0 in Accession #:    [id number]       Weight:       163.4 lb Date of Birth:  [birth year]         BSA:          1.974 m Patient Age:    29 years         BP:           116/70 mmHg Patient Gender: M                HR:           63 bpm. Exam Location:  [facility] Procedure: 2D Echo, Cardiac Doppler and Color Doppler Indications:     Acute myocardial infarction I21.9  History:         Patient has no prior history of Echocardiogram examinations.                  Hypothyroidism.  Sonographer:     Sherrie Sport RDCS (AE) Referring Phys:  [place] Diagnosing Phys: Bartholome Bill MD IMPRESSIONS  1. Left ventricular ejection fraction, by estimation, is 40 to 45%. The left ventricle has normal function. The left ventricle demonstrates regional wall motion abnormalities (see scoring diagram/findings for description). Left ventricular diastolic parameters were normal.  2. Right ventricular systolic function is normal. The right ventricular size is normal.  3. The mitral valve is grossly normal. Trivial mitral valve regurgitation.  4. The aortic valve is grossly normal. Aortic valve regurgitation is not visualized. FINDINGS  Left Ventricle: Left ventricular ejection fraction, by estimation, is 40 to 45%. The left ventricle has normal function. The left ventricle demonstrates regional wall motion abnormalities. The left ventricular internal cavity size was normal in size. There is no left ventricular hypertrophy. Left ventricular diastolic parameters were normal.  LV Wall Scoring: The mid anteroseptal segment is akinetic. The basal anteroseptal segment, mid inferolateral segment,  basal anterior segment, and basal inferoseptal segment are hypokinetic. The basal inferolateral segment, basal anterolateral segment, and basal inferior segment are normal. Right Ventricle: The right ventricular size is normal. No increase in right ventricular wall thickness. Right ventricular systolic function is normal. Left Atrium: Left atrial size was normal in size. Right Atrium: Right atrial size was normal in size. Pericardium: There is no evidence of pericardial effusion. Mitral Valve: The mitral valve is grossly normal. Trivial mitral valve regurgitation. Tricuspid Valve: The tricuspid valve is not well visualized. Tricuspid valve regurgitation is trivial. Aortic Valve: The aortic valve is grossly normal. Aortic valve regurgitation is not visualized. Aortic valve mean gradient measures 2.0 mmHg. Aortic valve peak gradient measures 3.9 mmHg. Aortic valve area, by VTI measures 3.12 cm. Pulmonic Valve: The pulmonic valve was not well visualized. Pulmonic valve regurgitation is not visualized. Aorta: The aortic root is normal in size and structure. IAS/Shunts: The interatrial septum was not assessed.  LEFT VENTRICLE PLAX 2D LVIDd:         4.99 cm      Diastology LVIDs:         3.83 cm      LV e' medial:    5.77 cm/s LV PW:         0.66 cm  LV E/e' medial:  8.6 LV IVS:        0.75 cm      LV e' lateral:   4.90 cm/s LVOT diam:     2.00 cm      LV E/e' lateral: 10.1 LV SV:         55 LV SV Index:   28 LVOT Area:     3.14 cm  LV Volumes (MOD) LV vol d, MOD A2C: 69.9 ml LV vol d, MOD A4C: 103.0 ml LV vol s, MOD A2C: 34.9 ml LV vol s, MOD A4C: 61.6 ml LV SV MOD A2C:     35.0 ml LV SV MOD A4C:     103.0 ml LV SV MOD BP:      38.8 ml RIGHT VENTRICLE RV Basal diam:  2.76 cm RV S prime:     13.70 cm/s TAPSE (M-mode): 4.4 cm LEFT ATRIUM           Index       RIGHT ATRIUM           Index LA diam:      3.00 cm 1.52 cm/m  RA Area:     15.00 cm LA Vol (A2C): 47.1 ml 23.86 ml/m RA Volume:   38.30 ml  19.40 ml/m LA  Vol (A4C): 15.3 ml 7.75 ml/m  AORTIC VALVE                   PULMONIC VALVE AV Area (Vmax):    2.73 cm    PV Vmax:        0.37 m/s AV Area (Vmean):   2.45 cm    PV Peak grad:   0.6 mmHg AV Area (VTI):     3.12 cm    RVOT Peak grad: 2 mmHg AV Vmax:           98.30 cm/s AV Vmean:          73.350 cm/s AV VTI:            0.178 m AV Peak Grad:      3.9 mmHg AV Mean Grad:      2.0 mmHg LVOT Vmax:         85.50 cm/s LVOT Vmean:        57.100 cm/s LVOT VTI:          0.176 m LVOT/AV VTI ratio: 0.99  AORTA Ao Root diam: 3.63 cm MITRAL VALVE               TRICUSPID VALVE MV Area (PHT): 3.39 cm    TR Peak grad:   18.0 mmHg MV Decel Time: 224 msec    TR Vmax:        212.00 cm/s MV E velocity: 49.70 cm/s MV A velocity: 74.60 cm/s  SHUNTS MV E/A ratio:  0.67        Systemic VTI:  0.18 m                            Systemic Diam: 2.00 cm Bartholome Bill MD Electronically signed by Bartholome Bill MD Signature Date/Time: 06/17/2020/1:18:14 PM    Final        Assessment & Plan:   Problem List Items Addressed This Visit    Anemia - Primary    Recently had EGD and colonoscopy.  Check cbc and ferritin.  Has been on prilosec.  Initially on 40mg  bid.  Recently on 40mg  q day (  for the last year).  Decrease to 20mg  q day.  Currently asymptomatic.  Follow.       Relevant Orders   CBC with Differential/Platelet (Completed)   Ferritin (Completed)   Vitamin B12 (Completed)   Coronary artery disease with angina pectoris [facility])    Recent admission with anterior STEMI s/p PCI/DES of proximal LAD.  Continue Brilinta and metoprolol.  No chest pain now.  Breathing stable.  Continue cardiac rehab.        Hypercholesterolemia    Intolerant to statin medication (high dose statin).  Currently on praluent.  Low cholesterol diet and exercise.  Follow lipid panel and liver function tests.        Relevant Orders   Hepatic function panel (Completed)   Lipid panel (Completed)   Basic metabolic panel (Completed)   Obstructive sleep apnea     Continue cpap.        THYROID CANCER, HX OF    Followed by Dr Forde Dandy.  On synthroid.  Check tsh.       Relevant Orders   TSH (Completed)       Einar Pheasant, MD

## 2020-08-12 NOTE — Assessment & Plan Note (Signed)
Intolerant to statin medication (high dose statin).  Currently on praluent.  Low cholesterol diet and exercise.  Follow lipid panel and liver function tests.

## 2020-08-15 ENCOUNTER — Encounter: Payer: Self-pay | Admitting: Internal Medicine

## 2020-08-15 DIAGNOSIS — D509 Iron deficiency anemia, unspecified: Secondary | ICD-10-CM

## 2020-08-15 DIAGNOSIS — E7800 Pure hypercholesterolemia, unspecified: Secondary | ICD-10-CM

## 2020-08-15 DIAGNOSIS — E890 Postprocedural hypothyroidism: Secondary | ICD-10-CM

## 2020-08-16 ENCOUNTER — Other Ambulatory Visit: Payer: Self-pay

## 2020-08-17 ENCOUNTER — Other Ambulatory Visit: Payer: Self-pay

## 2020-08-17 DIAGNOSIS — Z955 Presence of coronary angioplasty implant and graft: Secondary | ICD-10-CM

## 2020-08-17 DIAGNOSIS — I2102 ST elevation (STEMI) myocardial infarction involving left anterior descending coronary artery: Secondary | ICD-10-CM | POA: Diagnosis not present

## 2020-08-17 NOTE — Telephone Encounter (Signed)
LMTCB and schedule non fasting lab in 6wks

## 2020-08-17 NOTE — Telephone Encounter (Signed)
Orders placed for f/u labs. Please schedule non fasting lab in 6 weeks.  Thanks

## 2020-08-17 NOTE — Progress Notes (Signed)
Daily Session Note  Patient Details  Name: Suhayb Anzalone MRN: [id number] Date of Birth: [birth year] Referring Provider:   Flowsheet Row Cardiac Rehab from 07/08/2020 in [facility]  Referring Provider [name] MD      Encounter Date: 08/17/2020  Check In:  Session Check In - 08/17/20 1736      Check-In   Supervising physician immediately available to respond to emergencies See telemetry face sheet for immediately available ER MD    Location [facility]-Cardiac & Pulmonary Rehab    Staff Present Birdie Sons, MPA, RN;Meredith Sherryll Burger, RN BSN;Joseph Lou Miner, [place] Exercise Physiologist    Virtual Visit No    Medication changes reported     No    Fall or balance concerns reported    No    Warm-up and Cool-down Performed on first and last piece of equipment    Resistance Training Performed Yes    VAD Patient? No    PAD/SET Patient? No      Pain Assessment   Currently in Pain? No/denies              Social History   Tobacco Use  Smoking Status Never Smoker  Smokeless Tobacco Never Used    Goals Met:  Independence with exercise equipment Exercise tolerated well No report of cardiac concerns or symptoms Strength training completed today  Goals Unmet:  Not Applicable  Comments: Pt able to follow exercise prescription today without complaint.  Will continue to monitor for progression.    Dr. Emily Filbert is Medical Director for [place] and [facility].

## 2020-08-18 ENCOUNTER — Encounter: Payer: Self-pay | Admitting: Internal Medicine

## 2020-08-24 ENCOUNTER — Encounter: Payer: Self-pay | Admitting: *Deleted

## 2020-08-24 ENCOUNTER — Other Ambulatory Visit: Payer: Self-pay

## 2020-08-24 DIAGNOSIS — Z955 Presence of coronary angioplasty implant and graft: Secondary | ICD-10-CM

## 2020-08-24 DIAGNOSIS — I2102 ST elevation (STEMI) myocardial infarction involving left anterior descending coronary artery: Secondary | ICD-10-CM

## 2020-08-24 NOTE — Progress Notes (Signed)
Cardiac Individual Treatment Plan  Patient Details  Name: Matthew Kelley MRN: [id number] Date of Birth: 1961-03-24 Referring Provider:   Flowsheet Row Cardiac Rehab from 07/08/2020 in [facility] Cardiac and Pulmonary Rehab  Referring Provider [name] MD      Initial Encounter Date:  Flowsheet Row Cardiac Rehab from 07/08/2020 in [facility] Cardiac and Pulmonary Rehab  Date 07/08/20      Visit Diagnosis: Status post coronary artery stent placement  ST elevation myocardial infarction involving left anterior descending (LAD) coronary artery [facility])  Patient's Home Medications on Admission:  Current Outpatient Medications:  .  Alirocumab (PRALUENT) 75 MG/ML SOAJ, Inject 75 mg subcutaneously every 14 (fourteen) days, Disp: 2 mL, Rfl: 6 .  aspirin 81 MG chewable tablet, Chew 1 tablet (81 mg total) by mouth daily., Disp: 30 tablet, Rfl: 2 .  BRILINTA 90 MG TABS tablet, Take 1 tablet (90 mg total) by mouth 2 (two) times daily, Disp: 180 tablet, Rfl: 3 .  cholecalciferol (VITAMIN D) 1000 units tablet, Take 1 tablet (1,000 Units total) by mouth daily., Disp: 90 tablet, Rfl: 1 .  cycloSPORINE (RESTASIS) 0.05 % ophthalmic emulsion, INSTILL ONE DROP INTO EACH EYE TWO TIMES DAILY, Disp: 180 each, Rfl: 4 .  finasteride (PROPECIA) 1 MG tablet, TAKE 1 TABLET BY MOUTH ONCE DAILY, Disp: 90 tablet, Rfl: 10 .  ketoconazole (NIZORAL) 2 % shampoo, MASSAGE INTO SCALP ONCE DAILY AS NEEDED. LET SIT SEVERAL MINUTES BEFORE RINSING., Disp: 360 mL, Rfl: 3 .  levothyroxine (SYNTHROID) 175 MCG tablet, Take 175 mcg by mouth daily before breakfast., Disp: , Rfl:  .  lisinopril (ZESTRIL) 5 MG tablet, Take 1 tablet (5 mg total) by mouth once daily, Disp: 90 tablet, Rfl: 3 .  metoprolol tartrate (LOPRESSOR) 25 MG tablet, Take 1 tablet (25 mg total) by mouth 2 (two) times daily, Disp: 180 tablet, Rfl: 3 .  Multiple Vitamins-Minerals (PRESERVISION AREDS 2) CAPS, , Disp: , Rfl:  .  omeprazole (PRILOSEC) 20 MG capsule, Take 1  capsule (20 mg total) by mouth daily., Disp: 90 capsule, Rfl: 3 .  ticagrelor (BRILINTA) 90 MG TABS tablet, Take 1 tablet (90 mg total) by mouth 2 (two) times daily. (Patient not taking: Reported on 08/12/2020), Disp: 60 tablet, Rfl: 1  Past Medical History: Past Medical History:  Diagnosis Date  . Blood in stool    h/o  . Cancer [facility])    thyroid cancer  . H/O total thyroidectomy   . History of chicken pox   . Hypercholesterolemia   . Hypothyroidism   . Sleep apnea   . Thyroid cancer ([facility])   . Vertigo     Tobacco Use: Social History   Tobacco Use  Smoking Status Never Smoker  Smokeless Tobacco Never Used    Labs: Recent Review Flowsheet Data    Labs for ITP Cardiac and Pulmonary Rehab Latest Ref Rng & Units 04/24/2019 07/17/2019 09/04/2019 12/25/2019 08/12/2020   Cholestrol 0 - 200 mg/dL 192 147 162 135 167   LDLCALC 0 - 99 mg/dL 133(H) 91 102(H) 79 112(H)   HDL >39.00 mg/dL 47.00 42 41.80 42.40 42.60   Trlycerides 0.0 - 149.0 mg/dL 61.0 68 90.0 66.0 62.0   Hemoglobin A1c 4.6 - 6.5 % - - 5.7 - -       Exercise Target Goals: Exercise Program Goal: Individual exercise prescription set using results from initial 6 min walk test and THRR while considering  patient's activity barriers and safety.   Exercise Prescription Goal: Initial exercise prescription builds  to 30-45 minutes a day of aerobic activity, 2-3 days per week.  Home exercise guidelines will be given to patient during program as part of exercise prescription that the participant will acknowledge.   Education: Aerobic Exercise: - Group verbal and visual presentation on the components of exercise prescription. Introduces F.I.T.T principle from ACSM for exercise prescriptions.  Reviews F.I.T.T. principles of aerobic exercise including progression. Written material given at graduation. Flowsheet Row Cardiac Rehab from 07/08/2020 in [facility] Cardiac and Pulmonary Rehab  Education need identified 07/08/20      Education:  Resistance Exercise: - Group verbal and visual presentation on the components of exercise prescription. Introduces F.I.T.T principle from ACSM for exercise prescriptions  Reviews F.I.T.T. principles of resistance exercise including progression. Written material given at graduation.    Education: Exercise & Equipment Safety: - Individual verbal instruction and demonstration of equipment use and safety with use of the equipment. Flowsheet Row Cardiac Rehab from 07/08/2020 in [facility] Cardiac and Pulmonary Rehab  Date 07/08/20  Educator [facility]  Instruction Review Code 1- Verbalizes Understanding      Education: Exercise Physiology & General Exercise Guidelines: - Group verbal and written instruction with models to review the exercise physiology of the cardiovascular system and associated critical values. Provides general exercise guidelines with specific guidelines to those with heart or lung disease.    Education: Flexibility, Balance, Mind/Body Relaxation: - Group verbal and visual presentation with interactive activity on the components of exercise prescription. Introduces F.I.T.T principle from ACSM for exercise prescriptions. Reviews F.I.T.T. principles of flexibility and balance exercise training including progression. Also discusses the mind body connection.  Reviews various relaxation techniques to help reduce and manage stress (i.e. Deep breathing, progressive muscle relaxation, and visualization). Balance handout provided to take home. Written material given at graduation.   Activity Barriers & Risk Stratification:  Activity Barriers & Cardiac Risk Stratification - 07/08/20 0736      Activity Barriers & Cardiac Risk Stratification   Activity Barriers Back Problems   occasional back pain   Cardiac Risk Stratification Moderate           6 Minute Walk:  6 Minute Walk    Row Name 07/08/20 1020         6 Minute Walk   Phase Initial     Distance 1770 feet     Walk Time 6 minutes      # of Rest Breaks 0     MPH 3.35     METS 4.64     RPE 11     VO2 Peak 16.23     Symptoms No     Resting HR 67 bpm     Resting BP 132/56     Resting Oxygen Saturation  97 %     Exercise Oxygen Saturation  during 6 min walk 99 %     Max Ex. HR 112 bpm     Max Ex. BP 138/74     2 Minute Post BP 122/70            Oxygen Initial Assessment:   Oxygen Re-Evaluation:   Oxygen Discharge (Final Oxygen Re-Evaluation):   Initial Exercise Prescription:  Initial Exercise Prescription - 07/08/20 1000      Date of Initial Exercise RX and Referring Provider   Date 07/08/20    Referring Provider [name] MD      Treadmill   MPH 3.3    Grade 2    Minutes 15    METs 4.44  Elliptical   Level 1    Speed 4.1    Minutes 15    METs 4      REL-XR   Level 4    Speed 50    Minutes 15    METs 4      Prescription Details   Frequency (times per week) 3    Duration Progress to 30 minutes of continuous aerobic without signs/symptoms of physical distress      Intensity   THRR 40-80% of Max Heartrate 104-141    Ratings of Perceived Exertion 11-13    Perceived Dyspnea 0-4      Progression   Progression Continue to progress workloads to maintain intensity without signs/symptoms of physical distress.      Resistance Training   Training Prescription Yes    Weight 10 lb    Reps 10-15           Perform Capillary Blood Glucose checks as needed.  Exercise Prescription Changes:  Exercise Prescription Changes    Row Name 07/08/20 1000 07/19/20 1300 08/17/20 1300         Response to Exercise   Blood Pressure (Admit) 132/56 128/60 118/68     Blood Pressure (Exercise) 138/74 152/82 130/54     Blood Pressure (Exit) 122/70 100/58 96/58     Heart Rate (Admit) 67 bpm 84 bpm 80 bpm     Heart Rate (Exercise) 112 bpm 118 bpm 127 bpm     Heart Rate (Exit) 67 bpm 93 bpm 90 bpm     Oxygen Saturation (Admit) 97 % -- --     Oxygen Saturation (Exercise) 99 % -- --      Rating of Perceived Exertion (Exercise) _0 Symptoms none none none     Comments walk test results third day --     Duration -- Continue with 30 min of aerobic exercise without signs/symptoms of physical distress. Continue with 30 min of aerobic exercise without signs/symptoms of physical distress.     Intensity -- THRR unchanged THRR unchanged           Progression   Progression -- Continue to progress workloads to maintain intensity without signs/symptoms of physical distress. Continue to progress workloads to maintain intensity without signs/symptoms of physical distress.     Average METs -- 6.35 4.6           Resistance Training   Training Prescription -- Yes Yes     Weight -- 10 lb 10 lb     Reps -- 10-15 10-15           Treadmill   MPH -- -- 3.5     Grade -- -- 1     Minutes -- -- 15     METs -- -- 4.16           Elliptical   Level -- 5 7     Minutes -- 15 15     METs -- 4.6 5           REL-XR   Level -- 12 --     Speed -- 50 --     Minutes -- 15 --     METs -- 8.1 --            Exercise Comments:   Exercise Goals and Review:  Exercise Goals    Row Name 07/08/20 1024             Exercise Goals  Increase Physical Activity Yes       Intervention Provide advice, education, support and counseling about physical activity/exercise needs.;Develop an individualized exercise prescription for aerobic and resistive training based on initial evaluation findings, risk stratification, comorbidities and participant's personal goals.       Expected Outcomes Short Term: Attend rehab on a regular basis to increase amount of physical activity.;Long Term: Add in home exercise to make exercise part of routine and to increase amount of physical activity.;Long Term: Exercising regularly at least 3-5 days a week.       Increase Strength and Stamina Yes       Intervention Provide advice, education, support and counseling about physical activity/exercise needs.;Develop  an individualized exercise prescription for aerobic and resistive training based on initial evaluation findings, risk stratification, comorbidities and participant's personal goals.       Expected Outcomes Short Term: Increase workloads from initial exercise prescription for resistance, speed, and METs.;Short Term: Perform resistance training exercises routinely during rehab and add in resistance training at home;Long Term: Improve cardiorespiratory fitness, muscular endurance and strength as measured by increased METs and functional capacity (6MWT)       Able to understand and use rate of perceived exertion (RPE) scale Yes       Intervention Provide education and explanation on how to use RPE scale       Expected Outcomes Short Term: Able to use RPE daily in rehab to express subjective intensity level;Long Term:  Able to use RPE to guide intensity level when exercising independently       Able to understand and use Dyspnea scale Yes       Intervention Provide education and explanation on how to use Dyspnea scale       Expected Outcomes Short Term: Able to use Dyspnea scale daily in rehab to express subjective sense of shortness of breath during exertion;Long Term: Able to use Dyspnea scale to guide intensity level when exercising independently       Knowledge and understanding of Target Heart Rate Range (THRR) Yes       Intervention Provide education and explanation of THRR including how the numbers were predicted and where they are located for reference       Expected Outcomes Short Term: Able to state/look up THRR;Short Term: Able to use daily as guideline for intensity in rehab;Long Term: Able to use THRR to govern intensity when exercising independently       Able to check pulse independently Yes       Intervention Provide education and demonstration on how to check pulse in carotid and radial arteries.;Review the importance of being able to check your own pulse for safety during independent  exercise       Expected Outcomes Short Term: Able to explain why pulse checking is important during independent exercise;Long Term: Able to check pulse independently and accurately       Understanding of Exercise Prescription Yes       Intervention Provide education, explanation, and written materials on patient's individual exercise prescription       Expected Outcomes Short Term: Able to explain program exercise prescription;Long Term: Able to explain home exercise prescription to exercise independently              Exercise Goals Re-Evaluation :  Exercise Goals Re-Evaluation    Row Name 07/11/20 1721 07/19/20 1323 08/04/20 1310 08/17/20 1746       Exercise Goal Re-Evaluation   Exercise Goals Review Increase Physical Activity;Able to  understand and use rate of perceived exertion (RPE) scale;Knowledge and understanding of Target Heart Rate Range (THRR);Understanding of Exercise Prescription;Increase Strength and Stamina;Able to check pulse independently Increase Physical Activity;Increase Strength and Stamina -- Increase Physical Activity;Able to understand and use rate of perceived exertion (RPE) scale;Knowledge and understanding of Target Heart Rate Range (THRR);Able to check pulse independently;Understanding of Exercise Prescription    Comments Reviewed RPE and dyspnea scales, THR and program prescription with pt today.  Pt voiced understanding and was given a copy of goals to take home. Errik is progressing well with exercise.  He does XR at level 12 and Elliptical at level 5.  Staff will monitor progress. Out since last review Reviewed home exercise with pt today.  Pt plans to use gym at [facility] and work with trainer for exercise.  Reviewed THR, pulse, RPE, sign and symptoms, pulse oximetery and when to call 911 or MD.  Also discussed weather considerations and indoor options.  Pt voiced understanding.    Expected Outcomes Short: Use RPE daily to regulate intensity. Long: Follow program  prescription in THR. Short attend consistently Long: increase overall MET level -- SHort: monitor vitals when exercising Long: maintain exercise independently           Discharge Exercise Prescription (Final Exercise Prescription Changes):  Exercise Prescription Changes - 08/17/20 1300      Response to Exercise   Blood Pressure (Admit) 118/68    Blood Pressure (Exercise) 130/54    Blood Pressure (Exit) 96/58    Heart Rate (Admit) 80 bpm    Heart Rate (Exercise) 127 bpm    Heart Rate (Exit) 90 bpm    Rating of Perceived Exertion (Exercise) 13    Symptoms none    Duration Continue with 30 min of aerobic exercise without signs/symptoms of physical distress.    Intensity THRR unchanged      Progression   Progression Continue to progress workloads to maintain intensity without signs/symptoms of physical distress.    Average METs 4.6      Resistance Training   Training Prescription Yes    Weight 10 lb    Reps 10-15      Treadmill   MPH 3.5    Grade 1    Minutes 15    METs 4.16      Elliptical   Level 7    Minutes 15    METs 5           Nutrition:  Target Goals: Understanding of nutrition guidelines, daily intake of sodium <1563m, cholesterol <2025m calories 30% from fat and 7% or less from saturated fats, daily to have 5 or more servings of fruits and vegetables.  Education: All About Nutrition: -Group instruction provided by verbal, written material, interactive activities, discussions, models, and posters to present general guidelines for heart healthy nutrition including fat, fiber, MyPlate, the role of sodium in heart healthy nutrition, utilization of the nutrition label, and utilization of this knowledge for meal planning. Follow up email sent as well. Written material given at graduation. [facility]  Education need identified 07/08/20      Biometrics:  Pre Biometrics - 07/08/20 1025      Pre  Biometrics   Height 6' 1.1" (1.857 m)    Weight 191 lb 1.6 oz (86.7 kg)    BMI (Calculated) 25.14    Single Leg Stand 30 seconds  Nutrition Therapy Plan and Nutrition Goals:  Nutrition Therapy & Goals - 07/08/20 1043      Intervention Plan   Intervention Prescribe, educate and counsel regarding individualized specific dietary modifications aiming towards targeted core components such as weight, hypertension, lipid management, diabetes, heart failure and other comorbidities.    Expected Outcomes Short Term Goal: A plan has been developed with personal nutrition goals set during dietitian appointment.;Short Term Goal: Understand basic principles of dietary content, such as calories, fat, sodium, cholesterol and nutrients.;Long Term Goal: Adherence to prescribed nutrition plan.           Nutrition Assessments:  MEDIFICTS Score Key:  ?70 Need to make dietary changes   40-70 Heart Healthy Diet  ? 40 Therapeutic Level Cholesterol Diet  Flowsheet Row Cardiac Rehab from 07/08/2020 in [facility] Cardiac and Pulmonary Rehab  Picture Your Plate Total Score on Admission 67     Picture Your Plate Scores:  <04 Unhealthy dietary pattern with much room for improvement.  41-50 Dietary pattern unlikely to meet recommendations for good health and room for improvement.  51-60 More healthful dietary pattern, with some room for improvement.   >60 Healthy dietary pattern, although there may be some specific behaviors that could be improved.    Nutrition Goals Re-Evaluation:   Nutrition Goals Discharge (Final Nutrition Goals Re-Evaluation):   Psychosocial: Target Goals: Acknowledge presence or absence of significant depression and/or stress, maximize coping skills, provide positive support system. Participant is able to verbalize types and ability to use techniques and skills needed for reducing stress and depression.   Education: Stress, Anxiety, and Depression - Group verbal and  visual presentation to define topics covered.  Reviews how body is impacted by stress, anxiety, and depression.  Also discusses healthy ways to reduce stress and to treat/manage anxiety and depression.  Written material given at graduation.   Education: Sleep Hygiene -Provides group verbal and written instruction about how sleep can affect your health.  Define sleep hygiene, discuss sleep cycles and impact of sleep habits. Review good sleep hygiene tips.    Initial Review & Psychosocial Screening:  Initial Psych Review & Screening - 07/08/20 0736      Initial Review   Current issues with Current Stress Concerns    Source of Stress Concerns Occupation    Comments Already back work, no mental health history      [place]? Yes   wife and brother     Barriers   Psychosocial barriers to participate in program There are no identifiable barriers or psychosocial needs.      Screening Interventions   Interventions Encouraged to exercise;To provide support and resources with identified psychosocial needs;Provide feedback about the scores to participant    Expected Outcomes Short Term goal: Utilizing psychosocial counselor, staff and physician to assist with identification of specific Stressors or current issues interfering with healing process. Setting desired goal for each stressor or current issue identified.;Long Term Goal: Stressors or current issues are controlled or eliminated.;Short Term goal: Identification and review with participant of any Quality of Life or Depression concerns found by scoring the questionnaire.;Long Term goal: The participant improves quality of Life and PHQ9 Scores as seen by post scores and/or verbalization of changes           Quality of Life Scores:   Quality of Life - 07/08/20 1033      Quality of Life   Select Quality of Life      Quality of  Life Scores   Health/Function Pre 24.8 %    Socioeconomic Pre 25.71 %     Psych/Spiritual Pre 23.79 %    Family Pre 27.6 %    GLOBAL Pre 25.19 %          Scores of 19 and below usually indicate a poorer quality of life in these areas.  A difference of  2-3 points is a clinically meaningful difference.  A difference of 2-3 points in the total score of the Quality of Life Index has been associated with significant improvement in overall quality of life, self-image, physical symptoms, and general health in studies assessing change in quality of life.  PHQ-9: Recent Review Flowsheet Data    Depression screen [facility] 2/9 08/12/2020 07/08/2020 12/25/2019 12/19/2018 09/27/2017   Decreased Interest 0 0 0 0 0   Down, Depressed, Hopeless 0 0 0 0 0   PHQ - 2 Score 0 0 0 0 0   Altered sleeping - 0 - - 0   Tired, decreased energy - 1 - - 1   Change in appetite - 0 - - 0   Feeling bad or failure about yourself  - 0 - - 0   Trouble concentrating - 0 - - 0   Moving slowly or fidgety/restless - 0 - - 0   Suicidal thoughts - 0 - - 0   PHQ-9 Score - 1 - - 1   Difficult doing work/chores - Not difficult at all - - Not difficult at all     Interpretation of Total Score  Total Score Depression Severity:  1-4 = Minimal depression, 5-9 = Mild depression, 10-14 = Moderate depression, 15-19 = Moderately severe depression, 20-27 = Severe depression   Psychosocial Evaluation and Intervention:  Psychosocial Evaluation - 07/08/20 1026      Psychosocial Evaluation & Interventions   Interventions Encouraged to exercise with the program and follow exercise prescription;Stress management education    Comments Pt is coming into rehab after a heart attack and stent.  He was completely caught off guard with it as he had no previous symptoms and works out with a Clinical research associate at the [employer] three days a week after work.  He does not have any previous heart history other than high blood pressure and lipids.  He has a history of thyroidectomy after thyroid cancer. He denies any history of depression and  anxiety. He has great support in his wife and brother (who is also a cardiologist).  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 He is looking foward to learn his limits and how far to push and stress his body now.    Expected Outcomes Short: Attend rehab to boost confidence Long: Continue to stay positive.    Continue Psychosocial Services  Follow up required by staff           Psychosocial Re-Evaluation:   Psychosocial Discharge (Final Psychosocial Re-Evaluation):   Vocational Rehabilitation: Provide vocational rehab assistance to qualifying candidates.   Vocational Rehab Evaluation & Intervention:   Education: Education Goals: Education classes will be provided on a variety of topics geared toward better understanding of heart health and risk factor modification. Participant will state understanding/return demonstration of topics presented as noted by education test scores.  Learning Barriers/Preferences:  Learning Barriers/Preferences - 07/08/20 0736      Learning Barriers/Preferences   Learning Barriers Sight   glasses   Learning Preferences Skilled Demonstration  General Cardiac Education Topics:  AED/CPR: - Group verbal and written instruction with the use of models to demonstrate the basic use of the AED with the basic ABC's of resuscitation.   Anatomy and Cardiac Procedures: - Group verbal and visual presentation and models provide information about basic cardiac anatomy and function. Reviews the testing methods done to diagnose heart disease and the outcomes of the test results. Describes the treatment choices: Medical Management, Angioplasty, or Coronary Bypass Surgery for treating various heart conditions including Myocardial Infarction, Angina, Valve Disease, and Cardiac Arrhythmias.  Written material given at graduation.   Medication Safety: - Group verbal and visual instruction to review commonly  prescribed medications for heart and lung disease. Reviews the medication, class of the drug, and side effects. Includes the steps to properly store meds and maintain the prescription regimen.  Written material given at graduation.   Intimacy: - Group verbal instruction through game format to discuss how heart and lung disease can affect sexual intimacy. Written material given at graduation..   Know Your Numbers and Heart Failure: - Group verbal and visual instruction to discuss disease risk factors for cardiac and pulmonary disease and treatment options.  Reviews associated critical values for Overweight/Obesity, Hypertension, Cholesterol, and Diabetes.  Discusses basics of heart failure: signs/symptoms and treatments.  Introduces Heart Failure Zone chart for action plan for heart failure.  Written material given at graduation.   Infection Prevention: - Provides verbal and written material to individual with discussion of infection control including proper hand washing and proper equipment cleaning during exercise session. Flowsheet Row Cardiac Rehab from 07/08/2020 in [facility] Cardiac and Pulmonary Rehab  Date 07/08/20  Educator [facility]  Instruction Review Code 1- Verbalizes Understanding      Falls Prevention: - Provides verbal and written material to individual with discussion of falls prevention and safety. Flowsheet Row Cardiac Rehab from 07/08/2020 in [facility] Cardiac and Pulmonary Rehab  Date 07/08/20  Educator [facility]  Instruction Review Code 1- Verbalizes Understanding      Other: -Provides group and verbal instruction on various topics (see comments)   Knowledge Questionnaire Score:  Knowledge Questionnaire Score - 07/08/20 1044      Knowledge Questionnaire Score   Pre Score 23/26 Education Focus: Exercise, Nutrition           Core Components/Risk Factors/Patient Goals at Admission:  Personal Goals and Risk Factors at Admission - 07/08/20 1044      Core Components/Risk  Factors/Patient Goals on Admission    Weight Management Yes;Weight Loss    Intervention Weight Management: Develop a combined nutrition and exercise program designed to reach desired caloric intake, while maintaining appropriate intake of nutrient and fiber, sodium and fats, and appropriate energy expenditure required for the weight goal.;Weight Management: Provide education and appropriate resources to help participant work on and attain dietary goals.;Weight Management/Obesity: Establish reasonable short term and long term weight goals.    Admit Weight 191 lb 1.6 oz (86.7 kg)    Goal Weight: Short Term 185 lb (83.9 kg)    Goal Weight: Long Term 185 lb (83.9 kg)    Expected Outcomes Short Term: Continue to assess and modify interventions until short term weight is achieved;Long Term: Adherence to nutrition and physical activity/exercise program aimed toward attainment of established weight goal;Weight Loss: Understanding of general recommendations for a balanced deficit meal plan, which promotes 1-2 lb weight loss per week and includes a negative energy balance of [phone] kcal/d;Understanding recommendations for meals to include 15-35% energy as protein, 25-35%  energy from fat, 35-60% energy from carbohydrates, less than 277m of dietary cholesterol, 20-35 gm of total fiber daily;Understanding of distribution of calorie intake throughout the day with the consumption of 4-5 meals/snacks    Hypertension Yes    Intervention Provide education on lifestyle modifcations including regular physical activity/exercise, weight management, moderate sodium restriction and increased consumption of fresh fruit, vegetables, and low fat dairy, alcohol moderation, and smoking cessation.;Monitor prescription use compliance.    Expected Outcomes Short Term: Continued assessment and intervention until BP is < 140/969mHG in hypertensive participants. < 130/8061mG in hypertensive participants with diabetes, heart failure or  chronic kidney disease.;Long Term: Maintenance of blood pressure at goal levels.    Lipids Yes    Intervention Provide education and support for participant on nutrition & aerobic/resistive exercise along with prescribed medications to achieve LDL <20m79mDL >40mg75m Expected Outcomes Long Term: Cholesterol controlled with medications as prescribed, with individualized exercise RX and with personalized nutrition plan. Value goals: LDL < 20mg,45m > 40 mg.;Short Term: Participant states understanding of desired cholesterol values and is compliant with medications prescribed. Participant is following exercise prescription and nutrition guidelines.           Education:Diabetes - Individual verbal and written instruction to review signs/symptoms of diabetes, desired ranges of glucose level fasting, after meals and with exercise. Acknowledge that pre and post exercise glucose checks will be done for 3 sessions at entry of program.   Core Components/Risk Factors/Patient Goals Review:   Goals and Risk Factor Review    Row Name 08/23/20 0909             Core Components/Risk Factors/Patient Goals Review   Personal Goals Review Other       Review Davids attendance has been inconsistent due to his work schedule.  Staff reviewed home exercise and he works out with a traineClinical research associatede program sessions.       Expected Outcomes Short: continue to exercise consistently Long : manage risk factors on his own              Core Components/Risk Factors/Patient Goals at Discharge (Final Review):   Goals and Risk Factor Review - 08/23/20 0909      Core Components/Risk Factors/Patient Goals Review   Personal Goals Review Other    Review Davids attendance has been inconsistent due to his work schedule.  Staff reviewed home exercise and he works out with a traineClinical research associatede program sessions.    Expected Outcomes Short: continue to exercise consistently Long : manage risk factors on his own           ITP  Comments:  ITP Comments    Row Name 07/08/20 1020 07/11/20 1721 07/27/20 1009 08/04/20 1307 08/24/20 0724   ITP Comments Completed 6MWT and gym orientation. [facility]  Initial ITP created and sent for review to Dr. Mark MEmily Filbertcal Director. First full day of exercise!  Patient was oriented to gym and equipment including functions, settings, policies, and procedures.  Patient's individual exercise prescription and treatment plan were reviewed.  All starting workloads were established based on the results of the 6 minute walk test done at initial orientation visit.  The plan for exercise progression was also introduced and progression will be customized based on patient's performance and goals. 30 Day review completed. Medical Director ITP review done, changes made as directed, and signed approval by Medical Director.  New to  program Out since 07/18/20 with no calls. 30 Day review completed. Medical Director ITP review done, changes made as directed, and signed approval by Medical Director.          Comments:

## 2020-08-24 NOTE — Progress Notes (Signed)
Daily Session Note  Patient Details  Name: Brogen Duell MRN: [id number] Date of Birth: 06/12/1960 Referring Provider:   Flowsheet Row Cardiac Rehab from 07/08/2020 in [facility] Cardiac and Pulmonary Rehab  Referring Provider [name] MD      Encounter Date: 08/24/2020  Check In:  Session Check In - 08/24/20 1803      Check-In   Supervising physician immediately available to respond to emergencies See telemetry face sheet for immediately available ER MD    Location [facility]-Cardiac & Pulmonary Rehab    Staff Present Birdie Sons, MPA, RN;Joseph Lou Miner, MS Exercise Physiologist;Melissa Caiola RDN, LDN    Virtual Visit No    Medication changes reported     No    Fall or balance concerns reported    No    Warm-up and Cool-down Performed on first and last piece of equipment    Resistance Training Performed Yes    VAD Patient? No    PAD/SET Patient? No      Pain Assessment   Currently in Pain? No/denies              Social History   Tobacco Use  Smoking Status Never Smoker  Smokeless Tobacco Never Used    Goals Met:  Independence with exercise equipment Exercise tolerated well No report of cardiac concerns or symptoms Strength training completed today  Goals Unmet:  Not Applicable  Comments: Pt able to follow exercise prescription today without complaint.  Will continue to monitor for progression.    Dr. Emily Filbert is Medical Director for [place] and [facility].

## 2020-08-25 ENCOUNTER — Encounter: Payer: 59 | Admitting: *Deleted

## 2020-08-25 DIAGNOSIS — Z955 Presence of coronary angioplasty implant and graft: Secondary | ICD-10-CM | POA: Diagnosis not present

## 2020-08-25 DIAGNOSIS — I2102 ST elevation (STEMI) myocardial infarction involving left anterior descending coronary artery: Secondary | ICD-10-CM | POA: Diagnosis not present

## 2020-08-25 NOTE — Progress Notes (Signed)
Daily Session Note  Patient Details  Name: Matthew Kelley MRN: [id number] Date of Birth: 1960/07/18 Referring Provider:   Flowsheet Row Cardiac Rehab from 07/08/2020 in [facility] Cardiac and Pulmonary Rehab  Referring Provider [name] MD      Encounter Date: 08/25/2020  Check In:  Session Check In - 08/25/20 1726      Check-In   Supervising physician immediately available to respond to emergencies See telemetry face sheet for immediately available ER MD    Location [facility]-Cardiac & Pulmonary Rehab    Staff Present Renita Papa, RN BSN;Joseph Hood RCP,RRT,BSRT;Amanda Oletta Darter, [place], ACSM CEP, Exercise Physiologist    Virtual Visit No    Medication changes reported     No    Fall or balance concerns reported    No    Warm-up and Cool-down Performed on first and last piece of equipment    Resistance Training Performed Yes    VAD Patient? No    PAD/SET Patient? No      Pain Assessment   Currently in Pain? No/denies              Social History   Tobacco Use  Smoking Status Never Smoker  Smokeless Tobacco Never Used    Goals Met:  Independence with exercise equipment Exercise tolerated well No report of cardiac concerns or symptoms Strength training completed today  Goals Unmet:  Not Applicable  Comments: Pt able to follow exercise prescription today without complaint.  Will continue to monitor for progression.    Dr. Emily Filbert is Medical Director for [place] and [facility].

## 2020-08-26 ENCOUNTER — Other Ambulatory Visit: Payer: Self-pay

## 2020-08-29 ENCOUNTER — Encounter: Payer: 59 | Admitting: *Deleted

## 2020-08-29 ENCOUNTER — Other Ambulatory Visit: Payer: Self-pay

## 2020-08-29 DIAGNOSIS — I2102 ST elevation (STEMI) myocardial infarction involving left anterior descending coronary artery: Secondary | ICD-10-CM | POA: Diagnosis not present

## 2020-08-29 DIAGNOSIS — Z955 Presence of coronary angioplasty implant and graft: Secondary | ICD-10-CM

## 2020-08-29 NOTE — Progress Notes (Signed)
Daily Session Note  Patient Details  Name: Matthew Kelley MRN: [id number] Date of Birth: 1960/08/10 Referring Provider:   Flowsheet Row Cardiac Rehab from 07/08/2020 in [facility] Cardiac and Pulmonary Rehab  Referring Provider [name] MD      Encounter Date: 08/29/2020  Check In:  Session Check In - 08/29/20 1741      Check-In   Supervising physician immediately available to respond to emergencies See telemetry face sheet for immediately available ER MD    Location [facility]-Cardiac & Pulmonary Rehab    Staff Present Renita Papa, RN Margurite Auerbach, MS, ASCM CEP, Exercise Physiologist;Kelly Amedeo Plenty, BS, ACSM CEP, Exercise Physiologist    Virtual Visit No    Medication changes reported     No    Fall or balance concerns reported    No    Warm-up and Cool-down Performed on first and last piece of equipment    Resistance Training Performed Yes    VAD Patient? No    PAD/SET Patient? No      Pain Assessment   Currently in Pain? No/denies              Social History   Tobacco Use  Smoking Status Never Smoker  Smokeless Tobacco Never Used    Goals Met:  Independence with exercise equipment Exercise tolerated well No report of cardiac concerns or symptoms Strength training completed today  Goals Unmet:  Not Applicable  Comments: Pt able to follow exercise prescription today without complaint.  Will continue to monitor for progression.    Dr. Emily Filbert is Medical Director for [place].  Dr. Ottie Glazier is Medical Director for [facility] Pulmonary Rehabilitation.

## 2020-08-31 ENCOUNTER — Other Ambulatory Visit: Payer: Self-pay

## 2020-08-31 ENCOUNTER — Encounter: Payer: Self-pay | Admitting: Internal Medicine

## 2020-08-31 DIAGNOSIS — D649 Anemia, unspecified: Secondary | ICD-10-CM

## 2020-08-31 DIAGNOSIS — Z955 Presence of coronary angioplasty implant and graft: Secondary | ICD-10-CM | POA: Diagnosis not present

## 2020-08-31 DIAGNOSIS — I2102 ST elevation (STEMI) myocardial infarction involving left anterior descending coronary artery: Secondary | ICD-10-CM | POA: Diagnosis not present

## 2020-08-31 DIAGNOSIS — E611 Iron deficiency: Secondary | ICD-10-CM

## 2020-08-31 NOTE — Telephone Encounter (Signed)
Order placed for GI referral.   

## 2020-08-31 NOTE — Telephone Encounter (Signed)
Please notify him that someone should be contacting him with an appointment date and time. He previously saw Dr Tiffany Kocher.  Confirm - referral to [facility].

## 2020-08-31 NOTE — Progress Notes (Signed)
Daily Session Note  Patient Details  Name: Matthew Kelley MRN: [id number] Date of Birth: 04-Dec-1960 Referring Provider:   Flowsheet Row Cardiac Rehab from 07/08/2020 in [facility] Cardiac and Pulmonary Rehab  Referring Provider [name] MD      Encounter Date: 08/31/2020  Check In:  Session Check In - 08/31/20 1723      Check-In   Supervising physician immediately available to respond to emergencies See telemetry face sheet for immediately available ER MD    Location [facility]-Cardiac & Pulmonary Rehab    Staff Present Birdie Sons, MPA, RN;Melissa Newald RDN, LDN;Joseph Lou Miner, MS, ASCM CEP, Exercise Physiologist    Virtual Visit No    Medication changes reported     No    Fall or balance concerns reported    No    Warm-up and Cool-down Performed on first and last piece of equipment    Resistance Training Performed Yes    VAD Patient? No    PAD/SET Patient? No      Pain Assessment   Currently in Pain? No/denies              Social History   Tobacco Use  Smoking Status Never Smoker  Smokeless Tobacco Never Used    Goals Met:  Independence with exercise equipment Exercise tolerated well No report of cardiac concerns or symptoms Strength training completed today  Goals Unmet:  Not Applicable  Comments: Pt able to follow exercise prescription today without complaint.  Will continue to monitor for progression.    Dr. Emily Filbert is Medical Director for [place].  Dr. Ottie Glazier is Medical Director for [facility] Pulmonary Rehabilitation.

## 2020-09-01 ENCOUNTER — Encounter: Payer: 59 | Admitting: *Deleted

## 2020-09-01 DIAGNOSIS — I2102 ST elevation (STEMI) myocardial infarction involving left anterior descending coronary artery: Secondary | ICD-10-CM | POA: Diagnosis not present

## 2020-09-01 DIAGNOSIS — Z955 Presence of coronary angioplasty implant and graft: Secondary | ICD-10-CM

## 2020-09-01 NOTE — Progress Notes (Signed)
Daily Session Note  Patient Details  Name: Matthew Kelley MRN: [id number] Date of Birth: [birth year] Referring Provider:   Flowsheet Row Cardiac Rehab from 07/08/2020 in [facility] Cardiac and Pulmonary Rehab  Referring Provider [name] MD      Encounter Date: 09/01/2020  Check In:  Session Check In - 09/01/20 1728      Check-In   Supervising physician immediately available to respond to emergencies See telemetry face sheet for immediately available ER MD    Location [facility]-Cardiac & Pulmonary Rehab    Staff Present Renita Papa, RN BSN;Melissa Caiola RDN, Rowe Pavy, BA, ACSM CEP, Exercise Physiologist;Kara Eliezer Bottom, MS, ASCM CEP, Exercise Physiologist    Virtual Visit No    Medication changes reported     No    Warm-up and Cool-down Performed on first and last piece of equipment    Resistance Training Performed Yes    VAD Patient? No    PAD/SET Patient? No      Pain Assessment   Currently in Pain? No/denies              Social History   Tobacco Use  Smoking Status Never Smoker  Smokeless Tobacco Never Used    Goals Met:  Independence with exercise equipment Exercise tolerated well No report of cardiac concerns or symptoms Strength training completed today  Goals Unmet:  Not Applicable  Comments: Pt able to follow exercise prescription today without complaint.  Will continue to monitor for progression.    Dr. Emily Filbert is Medical Director for [place].  Dr. Ottie Glazier is Medical Director for [facility] Pulmonary Rehabilitation.

## 2020-09-07 ENCOUNTER — Other Ambulatory Visit: Payer: Self-pay

## 2020-09-07 ENCOUNTER — Encounter: Payer: 59 | Attending: Cardiology | Admitting: *Deleted

## 2020-09-07 DIAGNOSIS — I2102 ST elevation (STEMI) myocardial infarction involving left anterior descending coronary artery: Secondary | ICD-10-CM | POA: Insufficient documentation

## 2020-09-07 DIAGNOSIS — Z955 Presence of coronary angioplasty implant and graft: Secondary | ICD-10-CM | POA: Diagnosis not present

## 2020-09-07 NOTE — Progress Notes (Signed)
Daily Session Note  Patient Details  Name: Matthew Kelley MRN: [id number] Date of Birth: 01/03/1961 Referring Provider:   Flowsheet Row Cardiac Rehab from 07/08/2020 in [facility] Cardiac and Pulmonary Rehab  Referring Provider Paraschos, Alexander MD      Encounter Date: 09/07/2020  Check In:  Session Check In - 09/07/20 1731      Check-In   Supervising physician immediately available to respond to emergencies See telemetry face sheet for immediately available ER MD    Location [facility]-Cardiac & Pulmonary Rehab    Staff Present Meredith Craven, RN BSN;Joseph Hood RCP,RRT,BSRT;Kara Langdon, MS, ASCM CEP, Exercise Physiologist;Amanda Sommer, BA, ACSM CEP, Exercise Physiologist;Melissa Caiola RDN, LDN    Virtual Visit No    Medication changes reported     No    Fall or balance concerns reported    No    Warm-up and Cool-down Performed on first and last piece of equipment    Resistance Training Performed Yes    VAD Patient? No    PAD/SET Patient? No      Pain Assessment   Currently in Pain? No/denies              Social History   Tobacco Use  Smoking Status Never Smoker  Smokeless Tobacco Never Used    Goals Met:  Independence with exercise equipment Exercise tolerated well No report of cardiac concerns or symptoms Strength training completed today  Goals Unmet:  Not Applicable  Comments: Pt able to follow exercise prescription today without complaint.  Will continue to monitor for progression.    Dr. Mark Miller is Medical Director for [facility].  Dr. Fuad Aleskerov is Medical Director for [facility]. 

## 2020-09-08 ENCOUNTER — Encounter: Payer: 59 | Admitting: *Deleted

## 2020-09-08 DIAGNOSIS — Z955 Presence of coronary angioplasty implant and graft: Secondary | ICD-10-CM | POA: Diagnosis not present

## 2020-09-08 DIAGNOSIS — I6523 Occlusion and stenosis of bilateral carotid arteries: Secondary | ICD-10-CM | POA: Diagnosis not present

## 2020-09-08 DIAGNOSIS — I6529 Occlusion and stenosis of unspecified carotid artery: Secondary | ICD-10-CM | POA: Diagnosis not present

## 2020-09-08 DIAGNOSIS — I2102 ST elevation (STEMI) myocardial infarction involving left anterior descending coronary artery: Secondary | ICD-10-CM

## 2020-09-08 DIAGNOSIS — E782 Mixed hyperlipidemia: Secondary | ICD-10-CM | POA: Diagnosis not present

## 2020-09-08 DIAGNOSIS — I208 Other forms of angina pectoris: Secondary | ICD-10-CM | POA: Diagnosis not present

## 2020-09-08 DIAGNOSIS — I2510 Atherosclerotic heart disease of native coronary artery without angina pectoris: Secondary | ICD-10-CM | POA: Diagnosis not present

## 2020-09-08 NOTE — Progress Notes (Signed)
Daily Session Note  Patient Details  Name: Matthew Kelley MRN: [id number] Date of Birth: 03/02/1961 Referring Provider:   Flowsheet Row Cardiac Rehab from 07/08/2020 in [facility] Cardiac and Pulmonary Rehab  Referring Provider Paraschos, Alexander MD      Encounter Date: 09/08/2020  Check In:  Session Check In - 09/08/20 1723      Check-In   Supervising physician immediately available to respond to emergencies See telemetry face sheet for immediately available ER MD    Location [facility]-Cardiac & Pulmonary Rehab    Staff Present Meredith Craven, RN BSN;Joseph Hood RCP,RRT,BSRT;Kara Langdon, MS, ASCM CEP, Exercise Physiologist;Amanda Sommer, BA, ACSM CEP, Exercise Physiologist    Virtual Visit No    Medication changes reported     No    Fall or balance concerns reported    No    Warm-up and Cool-down Performed on first and last piece of equipment    Resistance Training Performed Yes    VAD Patient? No    PAD/SET Patient? No      Pain Assessment   Currently in Pain? No/denies              Social History   Tobacco Use  Smoking Status Never Smoker  Smokeless Tobacco Never Used    Goals Met:  Independence with exercise equipment Exercise tolerated well No report of cardiac concerns or symptoms Strength training completed today  Goals Unmet:  Not Applicable  Comments: Pt able to follow exercise prescription today without complaint.  Will continue to monitor for progression.    Dr. Mark Miller is Medical Director for [facility].  Dr. Fuad Aleskerov is Medical Director for [facility]. 

## 2020-09-09 ENCOUNTER — Other Ambulatory Visit: Payer: Self-pay

## 2020-09-09 DIAGNOSIS — Z8371 Family history of colonic polyps: Secondary | ICD-10-CM | POA: Diagnosis not present

## 2020-09-09 DIAGNOSIS — D500 Iron deficiency anemia secondary to blood loss (chronic): Secondary | ICD-10-CM | POA: Diagnosis not present

## 2020-09-09 DIAGNOSIS — Z955 Presence of coronary angioplasty implant and graft: Secondary | ICD-10-CM

## 2020-09-09 DIAGNOSIS — K5520 Angiodysplasia of colon without hemorrhage: Secondary | ICD-10-CM | POA: Diagnosis not present

## 2020-09-09 DIAGNOSIS — K259 Gastric ulcer, unspecified as acute or chronic, without hemorrhage or perforation: Secondary | ICD-10-CM | POA: Diagnosis not present

## 2020-09-09 NOTE — Progress Notes (Signed)
RD visit

## 2020-09-12 ENCOUNTER — Other Ambulatory Visit: Payer: Self-pay

## 2020-09-14 ENCOUNTER — Other Ambulatory Visit: Payer: Self-pay

## 2020-09-14 VITALS — Ht 73.0 in | Wt 187.4 lb

## 2020-09-14 DIAGNOSIS — I2102 ST elevation (STEMI) myocardial infarction involving left anterior descending coronary artery: Secondary | ICD-10-CM | POA: Diagnosis not present

## 2020-09-14 DIAGNOSIS — Z955 Presence of coronary angioplasty implant and graft: Secondary | ICD-10-CM

## 2020-09-14 NOTE — Patient Instructions (Signed)
Discharge Patient Instructions  Patient Details  Name: Matthew Kelley MRN: [id number] Date of Birth: Aug 23, 1960 Referring Provider:  [name], MD   Number of Visits: 73  Reason for Discharge:  Patient reached a stable level of exercise.   Diagnosis:  Status post coronary artery stent placement  ST elevation myocardial infarction involving left anterior descending (LAD) coronary artery [facility])  Initial Exercise Prescription:  Initial Exercise Prescription - 07/08/20 1000      Date of Initial Exercise RX and Referring Provider   Date 07/08/20    Referring Provider [name] MD      Treadmill   MPH 3.3    Grade 2    Minutes 15    METs 4.44      Elliptical   Level 1    Speed 4.1    Minutes 15    METs 4      REL-XR   Level 4    Speed 50    Minutes 15    METs 4      Prescription Details   Frequency (times per week) 3    Duration Progress to 30 minutes of continuous aerobic without signs/symptoms of physical distress      Intensity   THRR 40-80% of Max Heartrate 104-141    Ratings of Perceived Exertion 11-13    Perceived Dyspnea 0-4      Progression   Progression Continue to progress workloads to maintain intensity without signs/symptoms of physical distress.      Resistance Training   Training Prescription Yes    Weight 10 lb    Reps 10-15           Discharge Exercise Prescription (Final Exercise Prescription Changes):  Exercise Prescription Changes - 09/01/20 0800      Response to Exercise   Blood Pressure (Admit) 118/58    Blood Pressure (Exercise) 128/60    Blood Pressure (Exit) 98/54    Heart Rate (Admit) 60 bpm    Heart Rate (Exercise) 126 bpm    Heart Rate (Exit) 89 bpm    Rating of Perceived Exertion (Exercise) 13    Symptoms none    Duration Continue with 30 min of aerobic exercise without signs/symptoms of physical distress.    Intensity THRR unchanged      Progression   Progression Continue to progress workloads  to maintain intensity without signs/symptoms of physical distress.    Average METs 5.6      Resistance Training   Training Prescription Yes    Weight 10 lb    Reps 10-15      Interval Training   Interval Training No      Treadmill   MPH 3.5    Grade 4    Minutes 15    METs 5.61      Elliptical   Level 10    Speed 4.1    Minutes 15    METs 5.6      Home Exercise Plan   Plans to continue exercise at [facility] (comment)   ACC   Frequency Add 2 additional days to program exercise sessions.    Initial Home Exercises Provided 08/17/20           Functional Capacity:  6 Minute Walk    Row Name 07/08/20 1020 09/14/20 1749       6 Minute Walk   Phase Initial Discharge    Distance 1770 feet 2185 feet    Distance % Change -- 23.4 %  Distance Feet Change -- 415 ft    Walk Time 6 minutes 6 minutes    # of Rest Breaks 0 0    MPH 3.35 4.13    METS 4.64 5.58    RPE 11 13    Perceived Dyspnea  -- 0    VO2 Peak 16.23 19.54    Symptoms No No    Resting HR 67 bpm 68 bpm    Resting BP 132/56 112/60    Resting Oxygen Saturation  97 % 99 %    Exercise Oxygen Saturation  during 6 min walk 99 % 97 %    Max Ex. HR 112 bpm 123 bpm    Max Ex. BP 138/74 146/62    2 Minute Post BP 122/70 --            Nutrition & Weight - Outcomes:  Pre Biometrics - 07/08/20 1025      Pre Biometrics   Height 6' 1.1" (1.857 m)    Weight 191 lb 1.6 oz (86.7 kg)    BMI (Calculated) 25.14    Single Leg Stand 30 seconds           Post Biometrics - 09/14/20 1757       Post  Biometrics   Height 6\' 1"  (1.854 m)    Weight 187 lb 6.4 oz (85 kg)    BMI (Calculated) 24.73    Single Leg Stand 30 seconds           Nutrition:  Nutrition Therapy & Goals - 09/09/20 0929      Nutrition Therapy   Diet Heart healthy, low Na    Protein (specify units) 70g    Fiber 30 grams    Whole Grain Foods 3 servings    Saturated Fats 12 max. 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    Comments B: coffee (1/2 and 1/2) with sweet pastry or small piece of cake (weekday no food until lunch) L: grilled chicken salad or sandwich D: salmon or chicken or other fish or steak with asparagus, broccoli, or salad with starch potato. 60-70% take out from country club. Olive oil, sometimes butter. Rarely uses salt. Drinks: water most of the time, sometimes tea for lunch or dinner 3x/week (unsweet tea or half and half). Discussed heart healthy eating and sources of iron as well as how to improve iron absorption with diet - ongoing anemia.      Intervention Plan   Intervention Prescribe, educate and counsel regarding individualized specific dietary modifications aiming towards targeted core components such as weight, hypertension, lipid management, diabetes, heart failure and other comorbidities.    Expected Outcomes Short Term Goal: A plan has been developed with personal nutrition goals set during dietitian appointment.;Short Term Goal: Understand basic principles of dietary content, such as calories, fat, sodium, cholesterol and nutrients.;Long Term Goal: Adherence to prescribed nutrition plan.              Goals reviewed with patient; copy given to patient.

## 2020-09-14 NOTE — Progress Notes (Signed)
Daily Session Note  Patient Details  Name: Matthew Kelley MRN: [id number] Date of Birth: 07/15/1960 Referring Provider:   Flowsheet Row Cardiac Rehab from 07/08/2020 in [facility]  Referring Provider [name] MD      Encounter Date: 09/14/2020  Check In:  Session Check In - 09/14/20 1736      Check-In   Supervising physician immediately available to respond to emergencies See telemetry face sheet for immediately available ER MD    Location [facility]    Staff Present Birdie Sons, MPA, RN;Melissa Caiola RDN, LDN;Joseph Lou Miner, MS, ASCM CEP, Exercise Physiologist    Virtual Visit No    Medication changes reported     No    Fall or balance concerns reported    No    Warm-up and Cool-down Performed on first and last piece of equipment    Resistance Training Performed Yes    VAD Patient? No    PAD/SET Patient? No      Pain Assessment   Currently in Pain? No/denies            6 Minute Walk    Row Name 07/08/20 1020 09/14/20 1749       6 Minute Walk   Phase Initial Discharge    Distance 1770 feet 2185 feet    Distance % Change -- 23.4 %    Distance Feet Change -- 415 ft    Walk Time 6 minutes 6 minutes    # of Rest Breaks 0 0    MPH 3.35 4.13    METS 4.64 5.58    RPE 11 13    Perceived Dyspnea  -- 0    VO2 Peak 16.23 19.54    Symptoms No No    Resting HR 67 bpm 68 bpm    Resting BP 132/56 112/60    Resting Oxygen Saturation  97 % 99 %    Exercise Oxygen Saturation  during 6 min walk 99 % 97 %    Max Ex. HR 112 bpm 123 bpm    Max Ex. BP 138/74 146/62    2 Minute Post BP 122/70 --            Social History   Tobacco Use  Smoking Status Never Smoker  Smokeless Tobacco Never Used    Goals Met:  Independence with exercise equipment Exercise tolerated well No report of cardiac concerns or symptoms Strength training completed today  Goals Unmet:  Not Applicable  Comments:   Matthew Kelley graduated today from  rehab with 16 sessions completed.  Details of the patient's exercise prescription and what He needs to do in order to continue the prescription and progress were discussed with patient.  Patient was given a copy of prescription and goals.  Patient verbalized understanding.  Marx plans to continue to exercise by working out with personal training and exercising on his own.    Dr. Emily Filbert is Medical Director for [employer].  Dr. Ottie Glazier is Medical Director for [facility].

## 2020-09-21 ENCOUNTER — Encounter: Payer: Self-pay | Admitting: *Deleted

## 2020-09-21 DIAGNOSIS — Z955 Presence of coronary angioplasty implant and graft: Secondary | ICD-10-CM

## 2020-09-21 DIAGNOSIS — I2102 ST elevation (STEMI) myocardial infarction involving left anterior descending coronary artery: Secondary | ICD-10-CM

## 2020-09-21 NOTE — Progress Notes (Signed)
Cardiac Individual Treatment Plan  Patient Details  Name: Matthew Kelley MRN: [id number] Date of Birth: Aug 06, 1960 Referring Provider:   Flowsheet Row Cardiac Rehab from 07/08/2020 in [facility] Cardiac and Pulmonary Rehab  Referring Provider [name] MD       Initial Encounter Date:  Flowsheet Row Cardiac Rehab from 07/08/2020 in [facility] Cardiac and Pulmonary Rehab  Date 07/08/20       Visit Diagnosis: Status post coronary artery stent placement  ST elevation myocardial infarction involving left anterior descending (LAD) coronary artery ([place])  Patient's Home Medications on Admission:  Current Outpatient Medications:    Alirocumab (PRALUENT) 75 MG/ML SOAJ, Inject 75 mg subcutaneously every 14 (fourteen) days, Disp: 2 mL, Rfl: 6   aspirin 81 MG chewable tablet, Chew 1 tablet (81 mg total) by mouth daily., Disp: 30 tablet, Rfl: 2   BRILINTA 90 MG TABS tablet, Take 1 tablet (90 mg total) by mouth 2 (two) times daily, Disp: 180 tablet, Rfl: 3   cholecalciferol (VITAMIN D) 1000 units tablet, Take 1 tablet (1,000 Units total) by mouth daily., Disp: 90 tablet, Rfl: 1   cycloSPORINE (RESTASIS) 0.05 % ophthalmic emulsion, INSTILL ONE DROP INTO EACH EYE TWO TIMES DAILY, Disp: 180 each, Rfl: 4   finasteride (PROPECIA) 1 MG tablet, TAKE 1 TABLET BY MOUTH ONCE DAILY, Disp: 90 tablet, Rfl: 10   ketoconazole (NIZORAL) 2 % shampoo, MASSAGE INTO SCALP ONCE DAILY AS NEEDED. LET SIT SEVERAL MINUTES BEFORE RINSING., Disp: 360 mL, Rfl: 3   levothyroxine (SYNTHROID) 175 MCG tablet, Take 175 mcg by mouth daily before breakfast., Disp: , Rfl:    lisinopril (ZESTRIL) 5 MG tablet, Take 1 tablet (5 mg total) by mouth once daily, Disp: 90 tablet, Rfl: 3   metoprolol tartrate (LOPRESSOR) 25 MG tablet, Take 1 tablet (25 mg total) by mouth 2 (two) times daily, Disp: 180 tablet, Rfl: 3   Multiple Vitamins-Minerals (PRESERVISION AREDS 2) CAPS, , Disp: , Rfl:    omeprazole (PRILOSEC) 20 MG capsule, Take 1 capsule  (20 mg total) by mouth daily., Disp: 90 capsule, Rfl: 3   ticagrelor (BRILINTA) 90 MG TABS tablet, Take 1 tablet (90 mg total) by mouth 2 (two) times daily. (Patient not taking: Reported on 08/12/2020), Disp: 60 tablet, Rfl: 1  Past Medical History: Past Medical History:  Diagnosis Date   Blood in stool    h/o   Cancer ([place])    thyroid cancer   H/O total thyroidectomy    History of chicken pox    Hypercholesterolemia    Hypothyroidism    Sleep apnea    Thyroid cancer ([place])    Vertigo     Tobacco Use: Social History   Tobacco Use  Smoking Status Never  Smokeless Tobacco Never    Labs: Recent Review Flowsheet Data     Labs for ITP Cardiac and Pulmonary Rehab Latest Ref Rng & Units 04/24/2019 07/17/2019 09/04/2019 12/25/2019 08/12/2020   Cholestrol 0 - 200 mg/dL 192 147 162 135 167   LDLCALC 0 - 99 mg/dL 133(H) 91 102(H) 79 112(H)   HDL >39.00 mg/dL 47.00 42 41.80 42.40 42.60   Trlycerides 0.0 - 149.0 mg/dL 61.0 68 90.0 66.0 62.0   Hemoglobin A1c 4.6 - 6.5 % - - 5.7 - -        Exercise Target Goals: Exercise Program Goal: Individual exercise prescription set using results from initial 6 min walk test and THRR while considering  patient's activity barriers and safety.   Exercise Prescription Goal: Initial exercise  prescription builds to 30-45 minutes a day of aerobic activity, 2-3 days per week.  Home exercise guidelines will be given to patient during program as part of exercise prescription that the participant will acknowledge.   Education: Aerobic Exercise: - Group verbal and visual presentation on the components of exercise prescription. Introduces F.I.T.T principle from ACSM for exercise prescriptions.  Reviews F.I.T.T. principles of aerobic exercise including progression. Written material given at graduation. Flowsheet Row Cardiac Rehab from 07/08/2020 in [facility] Cardiac and Pulmonary Rehab  Education need identified 07/08/20       Education: Resistance Exercise: -  Group verbal and visual presentation on the components of exercise prescription. Introduces F.I.T.T principle from ACSM for exercise prescriptions  Reviews F.I.T.T. principles of resistance exercise including progression. Written material given at graduation.    Education: Exercise & Equipment Safety: - Individual verbal instruction and demonstration of equipment use and safety with use of the equipment. Flowsheet Row Cardiac Rehab from 07/08/2020 in [facility] Cardiac and Pulmonary Rehab  Date 07/08/20  Educator [facility]  Instruction Review Code 1- Verbalizes Understanding       Education: Exercise Physiology & General Exercise Guidelines: - Group verbal and written instruction with models to review the exercise physiology of the cardiovascular system and associated critical values. Provides general exercise guidelines with specific guidelines to those with heart or lung disease.    Education: Flexibility, Balance, Mind/Body Relaxation: - Group verbal and visual presentation with interactive activity on the components of exercise prescription. Introduces F.I.T.T principle from ACSM for exercise prescriptions. Reviews F.I.T.T. principles of flexibility and balance exercise training including progression. Also discusses the mind body connection.  Reviews various relaxation techniques to help reduce and manage stress (i.e. Deep breathing, progressive muscle relaxation, and visualization). Balance handout provided to take home. Written material given at graduation.   Activity Barriers & Risk Stratification:  Activity Barriers & Cardiac Risk Stratification - 07/08/20 0736       Activity Barriers & Cardiac Risk Stratification   Activity Barriers Back Problems   occasional back pain   Cardiac Risk Stratification Moderate             6 Minute Walk:  6 Minute Walk     Row Name 07/08/20 1020 09/14/20 1749       6 Minute Walk   Phase Initial Discharge    Distance 1770 feet 2185 feet    Distance %  Change -- 23.4 %    Distance Feet Change -- 415 ft    Walk Time 6 minutes 6 minutes    # of Rest Breaks 0 0    MPH 3.35 4.13    METS 4.64 5.58    RPE 11 13    Perceived Dyspnea  -- 0    VO2 Peak 16.23 19.54    Symptoms No No    Resting HR 67 bpm 68 bpm    Resting BP 132/56 112/60    Resting Oxygen Saturation  97 % 99 %    Exercise Oxygen Saturation  during 6 min walk 99 % 97 %    Max Ex. HR 112 bpm 123 bpm    Max Ex. BP 138/74 146/62    2 Minute Post BP 122/70 --             Oxygen Initial Assessment:   Oxygen Re-Evaluation:   Oxygen Discharge (Final Oxygen Re-Evaluation):   Initial Exercise Prescription:  Initial Exercise Prescription - 07/08/20 1000       Date of Initial Exercise  RX and Referring Provider   Date 07/08/20    Referring Provider Paraschos, Alexander MD      Treadmill   MPH 3.3    Grade 2    Minutes 15    METs 4.44      Elliptical   Level 1    Speed 4.1    Minutes 15    METs 4      REL-XR   Level 4    Speed 50    Minutes 15    METs 4      Prescription Details   Frequency (times per week) 3    Duration Progress to 30 minutes of continuous aerobic without signs/symptoms of physical distress      Intensity   THRR 40-80% of Max Heartrate 104-141    Ratings of Perceived Exertion 11-13    Perceived Dyspnea 0-4      Progression   Progression Continue to progress workloads to maintain intensity without signs/symptoms of physical distress.      Resistance Training   Training Prescription Yes    Weight 10 lb    Reps 10-15             Perform Capillary Blood Glucose checks as needed.  Exercise Prescription Changes:   Exercise Prescription Changes     Row Name 07/08/20 1000 07/19/20 1300 08/17/20 1300 09/01/20 0800       Response to Exercise   Blood Pressure (Admit) 132/56 128/60 118/68 118/58    Blood Pressure (Exercise) 138/74 152/82 130/54 128/60    Blood Pressure (Exit) 122/70 100/58 96/58 98/54     Heart Rate  (Admit) 67 bpm 84 bpm 80 bpm 60 bpm    Heart Rate (Exercise) 112 bpm 118 bpm 127 bpm 126 bpm    Heart Rate (Exit) 67 bpm 93 bpm 90 bpm 89 bpm    Oxygen Saturation (Admit) 97 % -- -- --    Oxygen Saturation (Exercise) 99 % -- -- --    Rating of Perceived Exertion (Exercise) 11 12 13 13     Symptoms none none none none    Comments walk test results third day -- --    Duration -- Continue with 30 min of aerobic exercise without signs/symptoms of physical distress. Continue with 30 min of aerobic exercise without signs/symptoms of physical distress. Continue with 30 min of aerobic exercise without signs/symptoms of physical distress.    Intensity -- THRR unchanged THRR unchanged THRR unchanged         Progression        Progression -- Continue to progress workloads to maintain intensity without signs/symptoms of physical distress. Continue to progress workloads to maintain intensity without signs/symptoms of physical distress. Continue to progress workloads to maintain intensity without signs/symptoms of physical distress.    Average METs -- 6.35 4.6 5.6         Resistance Training        Training Prescription -- Yes Yes Yes    Weight -- 10 lb 10 lb 10 lb    Reps -- 10-15 10-15 10-15         Interval Training        Interval Training -- -- -- No         Treadmill        MPH -- -- 3.5 3.5    Grade -- -- 1 4    Minutes -- -- 15 15    METs -- -- 4.16 5.61  Elliptical        Level -- 5 7 10     Speed -- -- -- 4.1    Minutes -- 15 15 15     METs -- 4.6 5 5.6         REL-XR        Level -- 12 -- --    Speed -- 50 -- --    Minutes -- 15 -- --    METs -- 8.1 -- --         Home Exercise Plan        Plans to continue exercise at -- -- -- [employer] (comment)  [facility]    Frequency -- -- -- Add 2 additional days to program exercise sessions.    Initial Home Exercises Provided -- -- -- 08/17/20            Exercise Comments:   Exercise Goals and Review:   Exercise  Goals     Row Name 07/08/20 1024             Exercise Goals   Increase Physical Activity Yes       Intervention Provide advice, education, support and counseling about physical activity/exercise needs.;Develop an individualized exercise prescription for aerobic and resistive training based on initial evaluation findings, risk stratification, comorbidities and participant's personal goals.       Expected Outcomes Short Term: Attend rehab on a regular basis to increase amount of physical activity.;Long Term: Add in home exercise to make exercise part of routine and to increase amount of physical activity.;Long Term: Exercising regularly at least 3-5 days a week.       Increase Strength and Stamina Yes       Intervention Provide advice, education, support and counseling about physical activity/exercise needs.;Develop an individualized exercise prescription for aerobic and resistive training based on initial evaluation findings, risk stratification, comorbidities and participant's personal goals.       Expected Outcomes Short Term: Increase workloads from initial exercise prescription for resistance, speed, and METs.;Short Term: Perform resistance training exercises routinely during rehab and add in resistance training at home;Long Term: Improve cardiorespiratory fitness, muscular endurance and strength as measured by increased METs and functional capacity (6MWT)       Able to understand and use rate of perceived exertion (RPE) scale Yes       Intervention Provide education and explanation on how to use RPE scale       Expected Outcomes Short Term: Able to use RPE daily in rehab to express subjective intensity level;Long Term:  Able to use RPE to guide intensity level when exercising independently       Able to understand and use Dyspnea scale Yes       Intervention Provide education and explanation on how to use Dyspnea scale       Expected Outcomes Short Term: Able to use Dyspnea scale daily in  rehab to express subjective sense of shortness of breath during exertion;Long Term: Able to use Dyspnea scale to guide intensity level when exercising independently       Knowledge and understanding of Target Heart Rate Range (THRR) Yes       Intervention Provide education and explanation of THRR including how the numbers were predicted and where they are located for reference       Expected Outcomes Short Term: Able to state/look up THRR;Short Term: Able to use daily as guideline for intensity in rehab;Long Term: Able to use THRR to govern intensity when  exercising independently       Able to check pulse independently Yes       Intervention Provide education and demonstration on how to check pulse in carotid and radial arteries.;Review the importance of being able to check your own pulse for safety during independent exercise       Expected Outcomes Short Term: Able to explain why pulse checking is important during independent exercise;Long Term: Able to check pulse independently and accurately       Understanding of Exercise Prescription Yes       Intervention Provide education, explanation, and written materials on patient's individual exercise prescription       Expected Outcomes Short Term: Able to explain program exercise prescription;Long Term: Able to explain home exercise prescription to exercise independently                Exercise Goals Re-Evaluation :  Exercise Goals Re-Evaluation     Row Name 07/11/20 1721 07/19/20 1323 08/04/20 1310 08/17/20 1746 09/01/20 0846     Exercise Goal Re-Evaluation   Exercise Goals Review Increase Physical Activity;Able to understand and use rate of perceived exertion (RPE) scale;Knowledge and understanding of Target Heart Rate Range (THRR);Understanding of Exercise Prescription;Increase Strength and Stamina;Able to check pulse independently Increase Physical Activity;Increase Strength and Stamina -- Increase Physical Activity;Able to understand and  use rate of perceived exertion (RPE) scale;Knowledge and understanding of Target Heart Rate Range (THRR);Able to check pulse independently;Understanding of Exercise Prescription Increase Physical Activity;Increase Strength and Stamina;Understanding of Exercise Prescription   Comments Reviewed RPE and dyspnea scales, THR and program prescription with pt today.  Pt voiced understanding and was given a copy of goals to take home. Jettson is progressing well with exercise.  He does XR at level 12 and Elliptical at level 5.  Staff will monitor progress. Out since last review Reviewed home exercise with pt today.  Pt plans to use gym at [facility] and work with trainer for exercise.  Reviewed THR, pulse, RPE, sign and symptoms, pulse oximetery and when to call 911 or MD.  Also discussed weather considerations and indoor options.  Pt voiced understanding. [place] continues to do well in rehab.  He does run late some days due to his work schedule.  He is up to level 10 on the elliptical!!  We will continue to monitor his progress.   Expected Outcomes Short: Use RPE daily to regulate intensity. Long: Follow program prescription in THR. Short attend consistently Long: increase overall MET level -- SHort: monitor vitals when exercising Long: maintain exercise independently Short: Continue to attend rehab regularly Long: Continue to exercise independently            Discharge Exercise Prescription (Final Exercise Prescription Changes):  Exercise Prescription Changes - 09/01/20 0800       Response to Exercise   Blood Pressure (Admit) 118/58    Blood Pressure (Exercise) 128/60    Blood Pressure (Exit) 98/54    Heart Rate (Admit) 60 bpm    Heart Rate (Exercise) 126 bpm    Heart Rate (Exit) 89 bpm    Rating of Perceived Exertion (Exercise) 13    Symptoms none    Duration Continue with 30 min of aerobic exercise without signs/symptoms of physical distress.    Intensity THRR unchanged      Progression   Progression  Continue to progress workloads to maintain intensity without signs/symptoms of physical distress.    Average METs 5.6      Resistance Training  Training Prescription Yes    Weight 10 lb    Reps 10-15      Interval Training   Interval Training No      Treadmill   MPH 3.5    Grade 4    Minutes 15    METs 5.61      Elliptical   Level 10    Speed 4.1    Minutes 15    METs 5.6      Home Exercise Plan   Plans to continue exercise at [facility] (comment)   [facility]   Frequency Add 2 additional days to program exercise sessions.    Initial Home Exercises Provided 08/17/20             Nutrition:  Target Goals: Understanding of nutrition guidelines, daily intake of sodium <1576m, cholesterol <2057m calories 30% from fat and 7% or less from saturated fats, daily to have 5 or more servings of fruits and vegetables.  Education: All About Nutrition: -Group instruction provided by verbal, written material, interactive activities, discussions, models, and posters to present general guidelines for heart healthy nutrition including fat, fiber, MyPlate, the role of sodium in heart healthy nutrition, utilization of the nutrition label, and utilization of this knowledge for meal planning. Follow up email sent as well. Written material given at graduation. [facility]  Education need identified 07/08/20       Biometrics:  Pre Biometrics - 07/08/20 1025       Pre Biometrics   Height 6' 1.1" (1.857 m)    Weight 191 lb 1.6 oz (86.7 kg)    BMI (Calculated) 25.14    Single Leg Stand 30 seconds             Post Biometrics - 09/14/20 1757        Post  Biometrics   Height 6' 1"  (1.854 m)    Weight 187 lb 6.4 oz (85 kg)    BMI (Calculated) 24.73    Single Leg Stand 30 seconds             Nutrition Therapy Plan and Nutrition Goals:  Nutrition Therapy & Goals - 09/09/20 0929       Nutrition Therapy    Diet Heart healthy, low Na    Protein (specify units) 70g    Fiber 30 grams    Whole Grain Foods 3 servings    Saturated Fats 12 max. 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    Comments B: coffee (1/2 and 1/2) with sweet pastry or small piece of cake (weekday no food until lunch) L: grilled chicken salad or sandwich D: salmon or chicken or other fish or steak with asparagus, broccoli, or salad with starch potato. 60-70% take out from country [employer]. Olive oil, sometimes butter. Rarely uses salt. Drinks: water most of the time, sometimes tea for lunch or dinner 3x/week (unsweet tea or half and half). Discussed heart healthy eating and sources of iron as well as how to improve iron absorption with diet - ongoing anemia.      Intervention Plan   Intervention Prescribe, educate and counsel regarding individualized specific dietary modifications aiming towards targeted core components such as weight,  hypertension, lipid management, diabetes, heart failure and other comorbidities.    Expected Outcomes Short Term Goal: A plan has been developed with personal nutrition goals set during dietitian appointment.;Short Term Goal: Understand basic principles of dietary content, such as calories, fat, sodium, cholesterol and nutrients.;Long Term Goal: Adherence to prescribed nutrition plan.             Nutrition Assessments:  MEDIFICTS Score Key: ?70 Need to make dietary changes  40-70 Heart Healthy Diet ? 40 Therapeutic Level Cholesterol Diet  Flowsheet Row Cardiac Rehab from 09/14/2020 in [facility] Cardiac and Pulmonary Rehab  Picture Your Plate Total Score on Admission 67  Picture Your Plate Total Score on Discharge 66      Picture Your Plate Scores: <79 Unhealthy dietary pattern with much room for  improvement. 41-50 Dietary pattern unlikely to meet recommendations for good health and room for improvement. 51-60 More healthful dietary pattern, with some room for improvement.  >60 Healthy dietary pattern, although there may be some specific behaviors that could be improved.    Nutrition Goals Re-Evaluation:   Nutrition Goals Discharge (Final Nutrition Goals Re-Evaluation):   Psychosocial: Target Goals: Acknowledge presence or absence of significant depression and/or stress, maximize coping skills, provide positive support system. Participant is able to verbalize types and ability to use techniques and skills needed for reducing stress and depression.   Education: Stress, Anxiety, and Depression - Group verbal and visual presentation to define topics covered.  Reviews how body is impacted by stress, anxiety, and depression.  Also discusses healthy ways to reduce stress and to treat/manage anxiety and depression.  Written material given at graduation.   Education: Sleep Hygiene -Provides group verbal and written instruction about how sleep can affect your health.  Define sleep hygiene, discuss sleep cycles and impact of sleep habits. Review good sleep hygiene tips.    Initial Review & Psychosocial Screening:  Initial Psych Review & Screening - 07/08/20 0736       Initial Review   Current issues with Current Stress Concerns    Source of Stress Concerns Occupation    Comments Already back work, no mental health history      [place]? Yes   wife and brother     Barriers   Psychosocial barriers to participate in program There are no identifiable barriers or psychosocial needs.      Screening Interventions   Interventions Encouraged to exercise;To provide support and resources with identified psychosocial needs;Provide feedback about the scores to participant    Expected Outcomes Short Term goal: Utilizing psychosocial counselor, staff and physician  to assist with identification of specific Stressors or current issues interfering with healing process. Setting desired goal for each stressor or current issue identified.;Long Term Goal: Stressors or current issues are controlled or eliminated.;Short Term goal: Identification and review with participant of any Quality of Life or Depression concerns found by scoring the questionnaire.;Long Term goal: The participant improves quality of Life and PHQ9 Scores as seen by post scores and/or verbalization of changes             Quality of Life Scores:   Quality of Life - 09/14/20 1747       Quality of Life Scores   Health/Function Pre 24.8 %    Health/Function Post 26.8 %    Health/Function % Change 8.06 %    Socioeconomic Pre 25.71 %    Socioeconomic Post 27.75 %    Socioeconomic % Change  7.93 %  Psych/Spiritual Pre 23.79 %    Psych/Spiritual Post 24.64 %    Psych/Spiritual % Change 3.57 %    Family Pre 27.6 %    Family Post 27.6 %    Family % Change 0 %    GLOBAL Pre 25.19 %    GLOBAL Post 26.7 %    GLOBAL % Change 5.99 %            Scores of 19 and below usually indicate a poorer quality of life in these areas.  A difference of  2-3 points is a clinically meaningful difference.  A difference of 2-3 points in the total score of the Quality of Life Index has been associated with significant improvement in overall quality of life, self-image, physical symptoms, and general health in studies assessing change in quality of life.  PHQ-9: Recent Review Flowsheet Data     Depression screen [facility] 2/9 09/14/2020 08/12/2020 07/08/2020 12/25/2019 12/19/2018   Decreased Interest 0 0 0 0 0   Down, Depressed, Hopeless 0 0 0 0 0   PHQ - 2 Score 0 0 0 0 0   Altered sleeping 0 - 0 - -   Tired, decreased energy 1 - 1 - -   Change in appetite 0 - 0 - -   Feeling bad or failure about yourself  0 - 0 - -   Trouble concentrating 0 - 0 - -   Moving slowly or fidgety/restless 0 - 0 - -   Suicidal  thoughts 0 - 0 - -   PHQ-9 Score 1 - 1 - -   Difficult doing work/chores Not difficult at all - Not difficult at all - -      Interpretation of Total Score  Total Score Depression Severity:  1-4 = Minimal depression, 5-9 = Mild depression, 10-14 = Moderate depression, 15-19 = Moderately severe depression, 20-27 = Severe depression   Psychosocial Evaluation and Intervention:  Psychosocial Evaluation - 07/08/20 1026       Psychosocial Evaluation & Interventions   Interventions Encouraged to exercise with the program and follow exercise prescription;Stress management education    Comments Pt is coming into rehab after a heart attack and stent.  He was completely caught off guard with it as he had no previous symptoms and works out with a Clinical research associate at the [employer] three days a week after work.  He does not have any previous heart history other than high blood pressure and lipids.  He has a history of thyroidectomy after thyroid cancer. He denies any history of depression and anxiety. He has great support in his wife and brother (who is also a cardiologist).  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 He is looking foward to learn his limits and how far to push and stress his body now.    Expected Outcomes Short: Attend rehab to boost confidence Long: Continue to stay positive.    Continue Psychosocial Services  Follow up required by staff             Psychosocial Re-Evaluation:   Psychosocial Discharge (Final Psychosocial Re-Evaluation):   Vocational Rehabilitation: Provide vocational rehab assistance to qualifying candidates.   Vocational Rehab Evaluation & Intervention:   Education: Education Goals: Education classes will be provided on a variety of topics geared toward better understanding of heart health and risk factor modification. Participant will state understanding/return demonstration of topics presented as noted by  education test  scores.  Learning Barriers/Preferences:  Learning Barriers/Preferences - 07/08/20 0736       Learning Barriers/Preferences   Learning Barriers Sight   glasses   Learning Preferences Skilled Demonstration             General Cardiac Education Topics:  AED/CPR: - Group verbal and written instruction with the use of models to demonstrate the basic use of the AED with the basic ABC's of resuscitation.   Anatomy and Cardiac Procedures: - Group verbal and visual presentation and models provide information about basic cardiac anatomy and function. Reviews the testing methods done to diagnose heart disease and the outcomes of the test results. Describes the treatment choices: Medical Management, Angioplasty, or Coronary Bypass Surgery for treating various heart conditions including Myocardial Infarction, Angina, Valve Disease, and Cardiac Arrhythmias.  Written material given at graduation.   Medication Safety: - Group verbal and visual instruction to review commonly prescribed medications for heart and lung disease. Reviews the medication, class of the drug, and side effects. Includes the steps to properly store meds and maintain the prescription regimen.  Written material given at graduation.   Intimacy: - Group verbal instruction through game format to discuss how heart and lung disease can affect sexual intimacy. Written material given at graduation..   Know Your Numbers and Heart Failure: - Group verbal and visual instruction to discuss disease risk factors for cardiac and pulmonary disease and treatment options.  Reviews associated critical values for Overweight/Obesity, Hypertension, Cholesterol, and Diabetes.  Discusses basics of heart failure: signs/symptoms and treatments.  Introduces Heart Failure Zone chart for action plan for heart failure.  Written material given at graduation.   Infection Prevention: - Provides verbal and written material to individual with  discussion of infection control including proper hand washing and proper equipment cleaning during exercise session. Flowsheet Row Cardiac Rehab from 07/08/2020 in [facility] Cardiac [facility]  Date 07/08/20  Educator [facility]  Instruction Review Code 1- Verbalizes Understanding       Falls Prevention: - Provides verbal and written material to individual with discussion of falls prevention and safety. Flowsheet Row Cardiac Rehab from 07/08/2020 in [facility] Cardiac [facility] Pul[facility] Rehab  Date 07/08/20  Educator [facility]  Instruction Review Code 1- Verbalizes Understanding       Other: -Provides group and verbal instruction on various topics (see comments)   Knowledge Questionnaire Score:  Knowledge Questionnaire Score - 09/14/20 1748       Knowledge Questionnaire Score   Pre Score 23/26 Education Focus: Exercise, Nutrition    Post Score 26/26             Core Components/Risk Factors/Patient Goals at Admission:  Personal Goals and Risk Factors at Admission - 07/08/20 1044       Core Components/Risk Factors/Patient Goals on Admission    Weight Management Yes;Weight Loss    Intervention Weight Management: Develop a combined nutrition and exercise program designed to reach desired caloric intake, while maintaining appropriate intake of nutrient and fiber, sodium and fats, and appropriate energy expenditure required for the weight goal.;Weight Management: Provide education and appropriate resources to help participant work on and attain dietary goals.;Weight Management/Obesity: Establish reasonable short term and long term weight goals.    Admit Weight 191 lb 1.6 oz (86.7 kg)    Goal Weight: Short Term 185 lb (83.9 kg)    Goal Weight: Long Term 185 lb (83.9 kg)    Expected Outcomes Short Term: Continue to assess and modify interventions until short term weight  is achieved;Long Term: Adherence to nutrition and physical activity/exercise program aimed toward attainment of established weight  goal;Weight Loss: Understanding of general recommendations for a balanced deficit meal plan, which promotes 1-2 lb weight loss per week and includes a negative energy balance of [phone] kcal/d;Understanding recommendations for meals to include 15-35% energy as protein, 25-35% energy from fat, 35-60% energy from carbohydrates, less than 228m of dietary cholesterol, 20-35 gm of total fiber daily;Understanding of distribution of calorie intake throughout the day with the consumption of 4-5 meals/snacks    Hypertension Yes    Intervention Provide education on lifestyle modifcations including regular physical activity/exercise, weight management, moderate sodium restriction and increased consumption of fresh fruit, vegetables, and low fat dairy, alcohol moderation, and smoking cessation.;Monitor prescription use compliance.    Expected Outcomes Short Term: Continued assessment and intervention until BP is < 140/935mHG in hypertensive participants. < 130/8029mG in hypertensive participants with diabetes, heart failure or chronic kidney disease.;Long Term: Maintenance of blood pressure at goal levels.    Lipids Yes    Intervention Provide education and support for participant on nutrition & aerobic/resistive exercise along with prescribed medications to achieve LDL <58m10mDL >40mg53m Expected Outcomes Long Term: Cholesterol controlled with medications as prescribed, with individualized exercise RX and with personalized nutrition plan. Value goals: LDL < 58mg,41m > 40 mg.;Short Term: Participant states understanding of desired cholesterol values and is compliant with medications prescribed. Participant is following exercise prescription and nutrition guidelines.             Education:Diabetes - Individual verbal and written instruction to review signs/symptoms of diabetes, desired ranges of glucose level fasting, after meals and with exercise. Acknowledge that pre and post exercise glucose checks will  be done for 3 sessions at entry of program.   Core Components/Risk Factors/Patient Goals Review:   Goals and Risk Factor Review     Row Name 08/23/20 0909             Core Components/Risk Factors/Patient Goals Review   Personal Goals Review Other       Review Davids attendance has been inconsistent due to his work schedule.  Staff reviewed home exercise and he works out with a traineClinical research associatede program sessions.       Expected Outcomes Short: continue to exercise consistently Long : manage risk factors on his own                Core Components/Risk Factors/Patient Goals at Discharge (Final Review):   Goals and Risk Factor Review - 08/23/20 0909       Core Components/Risk Factors/Patient Goals Review   Personal Goals Review Other    Review Davids attendance has been inconsistent due to his work schedule.  Staff reviewed home exercise and he works out with a traineClinical research associatede program sessions.    Expected Outcomes Short: continue to exercise consistently Long : manage risk factors on his own             ITP Comments:  ITP Comments     Row Name 07/08/20 1020 07/11/20 1721 07/27/20 1009 08/04/20 1307 08/24/20 0724   ITP Comments Completed 6MWT and gym orientation. [facility]  Initial ITP created and sent for review to Dr. Mark MEmily Filbertcal Director. First full day of exercise!  Patient was oriented to gym and equipment including functions, settings, policies, and procedures.  Patient's individual exercise prescription and treatment plan were  reviewed.  All starting workloads were established based on the results of the 6 minute walk test done at initial orientation visit.  The plan for exercise progression was also introduced and progression will be customized based on patient's performance and goals. 30 Day review completed. Medical Director ITP review done, changes made as directed, and signed approval by Medical  Director.  New to program Out since 07/18/20 with no calls. 30 Day review completed. Medical Director ITP review done, changes made as directed, and signed approval by Medical Director.    [place] Name 09/09/20 0932 09/14/20 1738 09/21/20 1204       ITP Comments Completed initial RD evaluation Shanon Brow graduated today from  rehab with 16 sessions completed.  Details of the patient's exercise prescription and what He needs to do in order to continue the prescription and progress were discussed with patient.  Patient was given a copy of prescription and goals.  Patient verbalized understanding.  Mathieu plans to continue to exercise by working out with personal training and exercising on his own. Ember graduated today from  rehab with 16 sessions completed.  Details of the patient's exercise prescription and what He needs to do in order to continue the prescription and progress were discussed with patient.  Patient was given a copy of prescription and goals.  Patient verbalized understanding.  Lemar plans to continue to exercise by working out with personal training and exercising on his own.              Comments: discharge ITP

## 2020-09-23 DIAGNOSIS — G4733 Obstructive sleep apnea (adult) (pediatric): Secondary | ICD-10-CM | POA: Diagnosis not present

## 2020-09-30 ENCOUNTER — Other Ambulatory Visit (INDEPENDENT_AMBULATORY_CARE_PROVIDER_SITE_OTHER): Payer: 59

## 2020-09-30 ENCOUNTER — Other Ambulatory Visit: Payer: Self-pay

## 2020-09-30 ENCOUNTER — Other Ambulatory Visit: Payer: Self-pay | Admitting: Internal Medicine

## 2020-09-30 DIAGNOSIS — E7800 Pure hypercholesterolemia, unspecified: Secondary | ICD-10-CM

## 2020-09-30 DIAGNOSIS — D509 Iron deficiency anemia, unspecified: Secondary | ICD-10-CM | POA: Diagnosis not present

## 2020-09-30 DIAGNOSIS — E890 Postprocedural hypothyroidism: Secondary | ICD-10-CM | POA: Diagnosis not present

## 2020-09-30 LAB — CBC WITH DIFFERENTIAL/PLATELET
Basophils Absolute: 0.1 10*3/uL (ref 0.0–0.1)
Basophils Relative: 1.3 % (ref 0.0–3.0)
Eosinophils Absolute: 0.5 10*3/uL (ref 0.0–0.7)
Eosinophils Relative: 10 % — ABNORMAL HIGH (ref 0.0–5.0)
HCT: 32.8 % — ABNORMAL LOW (ref 39.0–52.0)
Hemoglobin: 11 g/dL — ABNORMAL LOW (ref 13.0–17.0)
Lymphocytes Relative: 30.2 % (ref 12.0–46.0)
Lymphs Abs: 1.4 10*3/uL (ref 0.7–4.0)
MCHC: 33.6 g/dL (ref 30.0–36.0)
MCV: 86.2 fl (ref 78.0–100.0)
Monocytes Absolute: 0.5 10*3/uL (ref 0.1–1.0)
Monocytes Relative: 10.4 % (ref 3.0–12.0)
Neutro Abs: 2.3 10*3/uL (ref 1.4–7.7)
Neutrophils Relative %: 48.1 % (ref 43.0–77.0)
Platelets: 224 10*3/uL (ref 150.0–400.0)
RBC: 3.8 Mil/uL — ABNORMAL LOW (ref 4.22–5.81)
RDW: 16.5 % — ABNORMAL HIGH (ref 11.5–15.5)
WBC: 4.7 10*3/uL (ref 4.0–10.5)

## 2020-09-30 LAB — TSH: TSH: 0.04 u[IU]/mL — ABNORMAL LOW (ref 0.35–4.50)

## 2020-09-30 LAB — LIPID PANEL
Cholesterol: 171 mg/dL (ref 0–200)
HDL: 42.2 mg/dL (ref >39.00)
LDL Cholesterol: 110 mg/dL — ABNORMAL HIGH (ref 0–99)
NonHDL: 128.91
Total CHOL/HDL Ratio: 4
Triglycerides: 93 mg/dL (ref 0.0–149.0)
VLDL: 18.6 mg/dL (ref 0.0–40.0)

## 2020-09-30 LAB — FERRITIN: Ferritin: 36.7 ng/mL (ref 22.0–322.0)

## 2020-09-30 NOTE — Addendum Note (Signed)
Addended by: Ezequiel Ganser on: 09/30/2020 02:17 PM   Modules accepted: Orders

## 2020-09-30 NOTE — Progress Notes (Signed)
Order placed for lipid panel.

## 2020-10-03 ENCOUNTER — Other Ambulatory Visit: Payer: Self-pay

## 2020-10-04 ENCOUNTER — Other Ambulatory Visit: Payer: Self-pay

## 2020-10-05 ENCOUNTER — Other Ambulatory Visit: Payer: Self-pay

## 2020-10-26 ENCOUNTER — Other Ambulatory Visit: Payer: Self-pay

## 2020-10-28 ENCOUNTER — Other Ambulatory Visit: Payer: Self-pay

## 2020-10-28 DIAGNOSIS — H353132 Nonexudative age-related macular degeneration, bilateral, intermediate dry stage: Secondary | ICD-10-CM | POA: Diagnosis not present

## 2020-10-28 DIAGNOSIS — H35372 Puckering of macula, left eye: Secondary | ICD-10-CM | POA: Diagnosis not present

## 2020-10-28 DIAGNOSIS — H43812 Vitreous degeneration, left eye: Secondary | ICD-10-CM | POA: Diagnosis not present

## 2020-10-28 DIAGNOSIS — H31091 Other chorioretinal scars, right eye: Secondary | ICD-10-CM | POA: Diagnosis not present

## 2020-10-28 MED FILL — Finasteride Tab 1 MG: ORAL | 90 days supply | Qty: 90 | Fill #1 | Status: AC

## 2020-10-31 ENCOUNTER — Other Ambulatory Visit: Payer: Self-pay

## 2020-11-14 DIAGNOSIS — D509 Iron deficiency anemia, unspecified: Secondary | ICD-10-CM | POA: Diagnosis not present

## 2020-11-15 ENCOUNTER — Encounter: Payer: Self-pay | Admitting: Internal Medicine

## 2020-11-15 ENCOUNTER — Other Ambulatory Visit (INDEPENDENT_AMBULATORY_CARE_PROVIDER_SITE_OTHER): Payer: 59

## 2020-11-15 ENCOUNTER — Other Ambulatory Visit: Payer: Self-pay

## 2020-11-15 ENCOUNTER — Telehealth: Payer: Self-pay | Admitting: Internal Medicine

## 2020-11-15 DIAGNOSIS — D649 Anemia, unspecified: Secondary | ICD-10-CM

## 2020-11-15 LAB — CBC WITH DIFFERENTIAL/PLATELET
Basophils Absolute: 0.1 10*3/uL (ref 0.0–0.1)
Basophils Relative: 2.1 % (ref 0.0–3.0)
Eosinophils Absolute: 0.4 10*3/uL (ref 0.0–0.7)
Eosinophils Relative: 11.1 % — ABNORMAL HIGH (ref 0.0–5.0)
HCT: 32.8 % — ABNORMAL LOW (ref 39.0–52.0)
Hemoglobin: 10.8 g/dL — ABNORMAL LOW (ref 13.0–17.0)
Lymphocytes Relative: 30.2 % (ref 12.0–46.0)
Lymphs Abs: 1 10*3/uL (ref 0.7–4.0)
MCHC: 32.9 g/dL (ref 30.0–36.0)
MCV: 89.2 fl (ref 78.0–100.0)
Monocytes Absolute: 0.4 10*3/uL (ref 0.1–1.0)
Monocytes Relative: 11.5 % (ref 3.0–12.0)
Neutro Abs: 1.5 10*3/uL (ref 1.4–7.7)
Neutrophils Relative %: 45.1 % (ref 43.0–77.0)
Platelets: 212 10*3/uL (ref 150.0–400.0)
RBC: 3.67 Mil/uL — ABNORMAL LOW (ref 4.22–5.81)
RDW: 14.2 % (ref 11.5–15.5)
WBC: 3.4 10*3/uL — ABNORMAL LOW (ref 4.0–10.5)

## 2020-11-15 LAB — IBC + FERRITIN
Ferritin: 26.6 ng/mL (ref 22.0–322.0)
Iron: 29 ug/dL — ABNORMAL LOW (ref 42–165)
Saturation Ratios: 7.4 % — ABNORMAL LOW (ref 20.0–50.0)
Transferrin: 280 mg/dL (ref 212.0–360.0)

## 2020-11-15 NOTE — Telephone Encounter (Signed)
Per phone note patient coming in at 1300. Nothing else needed

## 2020-11-15 NOTE — Telephone Encounter (Signed)
I am ok to schedule iron studies and CBC.  Does he want me to schedule any other labs (cholesterol, etc)?

## 2020-11-15 NOTE — Telephone Encounter (Signed)
Labs ordered.  See if wants anything else checked.

## 2020-11-15 NOTE — Telephone Encounter (Signed)
LMTCB to let him know that we cannot do labs here at 1:00. No appt for lab until 2:30 this PM. I can order for medical mall so he can go by at his convenience but need to confirm with you that you are ok to order CBC and Ferritin for GI and add anything. I will order everything once ok is given.

## 2020-11-15 NOTE — Telephone Encounter (Signed)
Patient stated he does not need anything else at the moment just those two. Lab appointment has been scheduled for 1300 today.Marland Kitchen

## 2020-11-15 NOTE — Telephone Encounter (Signed)
Left message for patient to return call back.  

## 2020-11-15 NOTE — Telephone Encounter (Signed)
Patient is requesting to have a CBC w/diff and iron studies labs done.Please call him at [phone],patient stated it is ok to leave a voicemail.

## 2020-11-25 DIAGNOSIS — I447 Left bundle-branch block, unspecified: Secondary | ICD-10-CM | POA: Diagnosis not present

## 2020-11-25 DIAGNOSIS — I2102 ST elevation (STEMI) myocardial infarction involving left anterior descending coronary artery: Secondary | ICD-10-CM | POA: Diagnosis not present

## 2020-11-25 DIAGNOSIS — I2109 ST elevation (STEMI) myocardial infarction involving other coronary artery of anterior wall: Secondary | ICD-10-CM

## 2020-11-25 HISTORY — DX: ST elevation (STEMI) myocardial infarction involving other coronary artery of anterior wall: I21.09

## 2020-11-30 ENCOUNTER — Other Ambulatory Visit: Payer: Self-pay

## 2020-11-30 MED ORDER — PRALUENT 150 MG/ML ~~LOC~~ SOAJ
SUBCUTANEOUS | 12 refills | Status: DC
  Filled 2020-11-30: qty 2, 28d supply, fill #0
  Filled 2021-01-17: qty 2, 28d supply, fill #1
  Filled 2021-02-03: qty 2, 28d supply, fill #2
  Filled 2021-02-10 – 2021-03-03 (×2): qty 2, 28d supply, fill #3

## 2020-12-01 ENCOUNTER — Other Ambulatory Visit: Payer: Self-pay

## 2020-12-16 ENCOUNTER — Other Ambulatory Visit: Payer: Self-pay

## 2020-12-24 DIAGNOSIS — G4733 Obstructive sleep apnea (adult) (pediatric): Secondary | ICD-10-CM | POA: Diagnosis not present

## 2020-12-30 ENCOUNTER — Telehealth: Payer: Self-pay | Admitting: Internal Medicine

## 2020-12-30 ENCOUNTER — Telehealth: Payer: Self-pay

## 2020-12-30 ENCOUNTER — Encounter: Payer: Self-pay | Admitting: Internal Medicine

## 2020-12-30 ENCOUNTER — Ambulatory Visit (INDEPENDENT_AMBULATORY_CARE_PROVIDER_SITE_OTHER): Payer: 59 | Admitting: Internal Medicine

## 2020-12-30 ENCOUNTER — Other Ambulatory Visit: Payer: Self-pay

## 2020-12-30 VITALS — BP 118/74 | HR 66 | Temp 97.6°F | Resp 16 | Ht 73.0 in | Wt 193.8 lb

## 2020-12-30 DIAGNOSIS — Z0000 Encounter for general adult medical examination without abnormal findings: Secondary | ICD-10-CM

## 2020-12-30 DIAGNOSIS — Z85850 Personal history of malignant neoplasm of thyroid: Secondary | ICD-10-CM | POA: Diagnosis not present

## 2020-12-30 DIAGNOSIS — G4733 Obstructive sleep apnea (adult) (pediatric): Secondary | ICD-10-CM

## 2020-12-30 DIAGNOSIS — E7800 Pure hypercholesterolemia, unspecified: Secondary | ICD-10-CM

## 2020-12-30 DIAGNOSIS — Z23 Encounter for immunization: Secondary | ICD-10-CM

## 2020-12-30 DIAGNOSIS — I25119 Atherosclerotic heart disease of native coronary artery with unspecified angina pectoris: Secondary | ICD-10-CM

## 2020-12-30 DIAGNOSIS — Z125 Encounter for screening for malignant neoplasm of prostate: Secondary | ICD-10-CM | POA: Diagnosis not present

## 2020-12-30 DIAGNOSIS — D509 Iron deficiency anemia, unspecified: Secondary | ICD-10-CM | POA: Diagnosis not present

## 2020-12-30 LAB — PSA: PSA: 0.16 ng/mL (ref 0.10–4.00)

## 2020-12-30 LAB — CBC WITH DIFFERENTIAL/PLATELET
Basophils Absolute: 0 10*3/uL (ref 0.0–0.1)
Basophils Relative: 0.6 % (ref 0.0–3.0)
Eosinophils Absolute: 0.2 10*3/uL (ref 0.0–0.7)
Eosinophils Relative: 6.2 % — ABNORMAL HIGH (ref 0.0–5.0)
HCT: 34 % — ABNORMAL LOW (ref 39.0–52.0)
Hemoglobin: 11.5 g/dL — ABNORMAL LOW (ref 13.0–17.0)
Lymphocytes Relative: 32.4 % (ref 12.0–46.0)
Lymphs Abs: 1.2 10*3/uL (ref 0.7–4.0)
MCHC: 33.7 g/dL (ref 30.0–36.0)
MCV: 86.1 fl (ref 78.0–100.0)
Monocytes Absolute: 0.4 10*3/uL (ref 0.1–1.0)
Monocytes Relative: 10.4 % (ref 3.0–12.0)
Neutro Abs: 1.8 10*3/uL (ref 1.4–7.7)
Neutrophils Relative %: 50.4 % (ref 43.0–77.0)
Platelets: 198 10*3/uL (ref 150.0–400.0)
RBC: 3.95 Mil/uL — ABNORMAL LOW (ref 4.22–5.81)
RDW: 12.9 % (ref 11.5–15.5)
WBC: 3.6 10*3/uL — ABNORMAL LOW (ref 4.0–10.5)

## 2020-12-30 LAB — HEPATIC FUNCTION PANEL
ALT: 12 U/L (ref 0–53)
AST: 14 U/L (ref 0–37)
Albumin: 4.2 g/dL (ref 3.5–5.2)
Alkaline Phosphatase: 68 U/L (ref 39–117)
Bilirubin, Direct: 0.1 mg/dL (ref 0.0–0.3)
Total Bilirubin: 0.4 mg/dL (ref 0.2–1.2)
Total Protein: 6.3 g/dL (ref 6.0–8.3)

## 2020-12-30 LAB — BASIC METABOLIC PANEL
BUN: 17 mg/dL (ref 6–23)
CO2: 30 mEq/L (ref 19–32)
Calcium: 8.7 mg/dL (ref 8.4–10.5)
Chloride: 105 mEq/L (ref 96–112)
Creatinine, Ser: 1.1 mg/dL (ref 0.40–1.50)
GFR: 72.05 mL/min (ref >60.00)
Glucose, Bld: 101 mg/dL — ABNORMAL HIGH (ref 70–99)
Potassium: 4.3 mEq/L (ref 3.5–5.1)
Sodium: 141 mEq/L (ref 135–145)

## 2020-12-30 LAB — IBC + FERRITIN
Ferritin: 20.8 ng/mL — ABNORMAL LOW (ref 22.0–322.0)
Iron: 59 ug/dL (ref 42–165)
Saturation Ratios: 16.7 % — ABNORMAL LOW (ref 20.0–50.0)
TIBC: 352.8 ug/dL (ref 250.0–450.0)
Transferrin: 252 mg/dL (ref 212.0–360.0)

## 2020-12-30 LAB — TSH: TSH: 0.09 u[IU]/mL — ABNORMAL LOW (ref 0.35–5.50)

## 2020-12-30 NOTE — Telephone Encounter (Signed)
Patient scheduled for labs 03/10/21 as requested by check out note.   Needing orders placed.

## 2020-12-30 NOTE — Telephone Encounter (Signed)
Flu shot letter printed.

## 2020-12-30 NOTE — Progress Notes (Signed)
Patient ID: Matthew Kelley, male   DOB: 05-09-1960, 60 y.o.   MRN: [id number]   Subjective:    Patient ID: Matthew Kelley, male    DOB: 08-07-1960, 60 y.o.   MRN: [id number]  This visit occurred during the SARS-CoV-2 public health emergency.  Safety protocols were in place, including screening questions prior to the visit, additional usage of staff PPE, and extensive cleaning of exam room while observing appropriate contact time as indicated for disinfecting solutions.   Patient here for a physical exam.   Chief Complaint  Patient presents with   Annual Exam   .   HPI Here for his physical exam.  He is doing well.  Exercising 2x/week.  No chest pain or sob.  No acid reflux.  No abdominal pain.  Bowels moving.  No recent bleeding noticed.  History of STEMI.  S/p PCI and DES proximal LAD.  Initially on Brilinta and aspirin.  Off Brilinta now.  Video capsule endoscopy - shoed slow bleeding noted from AVM in mid small intestine.  Also two additional non bleeding AVMs noted in each of the mid small bowel and ileum.  Taking iron now.     Past Medical History:  Diagnosis Date   Blood in stool    h/o   Cancer ([place])    thyroid cancer   H/O total thyroidectomy    History of chicken pox    Hypercholesterolemia    Hypothyroidism    Sleep apnea    Thyroid cancer ([place])    Vertigo    Past Surgical History:  Procedure Laterality Date   CHOLECYSTECTOMY  2000   CORONARY/GRAFT ACUTE MI REVASCULARIZATION N/A 06/16/2020   Procedure: Coronary/Graft Acute MI Revascularization;  Surgeon: [name], MD;  Location: [facility];  Service: Cardiovascular;  Laterality: N/A;   LEFT HEART CATH AND CORONARY ANGIOGRAPHY N/A 06/16/2020   Procedure: LEFT HEART CATH AND CORONARY ANGIOGRAPHY;  Surgeon: [name], MD;  Location: [facility];  Service: Cardiovascular;  Laterality: N/A;   TOTAL THYROIDECTOMY  2009   Family History  Problem Relation Age of Onset   Hyperlipidemia  Father    Heart disease Father    Social History   Socioeconomic History   Marital status: Married    Spouse name: Not on file   Number of children: Not on file   Years of education: Not on file   Highest education level: Not on file  Occupational History   Not on file  Tobacco Use   Smoking status: Never   Smokeless tobacco: Never  Vaping Use   Vaping Use: Never used  Substance and Sexual Activity   Alcohol use: Yes    Alcohol/week: 0.0 standard drinks   Drug use: No   Sexual activity: Not on file  Other Topics Concern   Not on file  Social History Narrative   ** Merged History Encounter **       Social Determinants of Health   Financial Resource Strain: Not on file  Food Insecurity: Not on file  Transportation Needs: Not on file  Physical Activity: Not on file  Stress: Not on file  Social Connections: Not on file     Review of Systems  Constitutional:  Negative for appetite change and unexpected weight change.  HENT:  Negative for congestion, sinus pressure and sore throat.   Eyes:  Negative for pain and visual disturbance.  Respiratory:  Negative for cough, chest tightness and shortness of breath.   Cardiovascular:  Negative  for chest pain, palpitations and leg swelling.  Gastrointestinal:  Negative for abdominal pain, diarrhea, nausea and vomiting.  Genitourinary:  Negative for difficulty urinating and dysuria.  Musculoskeletal:  Negative for joint swelling and myalgias.  Skin:  Negative for color change and rash.  Neurological:  Negative for dizziness, light-headedness and headaches.  Hematological:  Negative for adenopathy. Does not bruise/bleed easily.  Psychiatric/Behavioral:  Negative for agitation and dysphoric mood.       Objective:     BP 118/74   Pulse 66   Temp 97.6 F (36.4 C)   Resp 16   Ht 6\' 1"  (1.854 m)   Wt 193 lb 12.8 oz (87.9 kg)   SpO2 99%   BMI 25.57 kg/m  Wt Readings from Last 3 Encounters:  12/30/20 193 lb 12.8 oz (87.9  kg)  09/14/20 187 lb 6.4 oz (85 kg)  08/12/20 190 lb 9.6 oz (86.5 kg)    Physical Exam Constitutional:      General: He is not in acute distress.    Appearance: Normal appearance. He is well-developed.  HENT:     Head: Normocephalic and atraumatic.     Right Ear: External ear normal.     Left Ear: External ear normal.  Eyes:     General: No scleral icterus.       Right eye: No discharge.        Left eye: No discharge.  Cardiovascular:     Rate and Rhythm: Normal rate and regular rhythm.  Pulmonary:     Effort: Pulmonary effort is normal. No respiratory distress.     Breath sounds: Normal breath sounds.  Abdominal:     General: Bowel sounds are normal.     Palpations: Abdomen is soft.     Tenderness: There is no abdominal tenderness.  Musculoskeletal:        General: No swelling or tenderness.     Cervical back: Neck supple. No tenderness.  Lymphadenopathy:     Cervical: No cervical adenopathy.  Skin:    Findings: No erythema or rash.  Neurological:     Mental Status: He is alert.  Psychiatric:        Mood and Affect: Mood normal.        Behavior: Behavior normal.     Outpatient Encounter Medications as of 12/30/2020  Medication Sig   Alirocumab (PRALUENT) 150 MG/ML SOAJ Inject 150 mg subcutaneously every 14 (fourteen) days   aspirin 81 MG chewable tablet Chew 1 tablet (81 mg total) by mouth daily.   cholecalciferol (VITAMIN D) 1000 units tablet Take 1 tablet (1,000 Units total) by mouth daily.   finasteride (PROPECIA) 1 MG tablet TAKE 1 TABLET BY MOUTH ONCE DAILY   ketoconazole (NIZORAL) 2 % shampoo MASSAGE INTO SCALP ONCE DAILY AS NEEDED. LET SIT SEVERAL MINUTES BEFORE RINSING.   levothyroxine (SYNTHROID) 175 MCG tablet Take 175 mcg by mouth daily before breakfast.   metoprolol tartrate (LOPRESSOR) 25 MG tablet Take 1 tablet (25 mg total) by mouth 2 (two) times daily   Multiple Vitamins-Minerals (PRESERVISION AREDS 2) CAPS    [DISCONTINUED] Alirocumab (PRALUENT) 75  MG/ML SOAJ Inject 75 mg subcutaneously every 14 (fourteen) days   [DISCONTINUED] BRILINTA 90 MG TABS tablet Take 1 tablet (90 mg total) by mouth 2 (two) times daily   [DISCONTINUED] lisinopril (ZESTRIL) 5 MG tablet Take 1 tablet (5 mg total) by mouth once daily   [DISCONTINUED] omeprazole (PRILOSEC) 20 MG capsule Take 1 capsule (20 mg total) by mouth daily.   [  DISCONTINUED] ticagrelor (BRILINTA) 90 MG TABS tablet Take 1 tablet (90 mg total) by mouth 2 (two) times daily. (Patient not taking: Reported on 08/12/2020)   No facility-administered encounter medications on file as of 12/30/2020.     Lab Results  Component Value Date   WBC 3.6 (L) 12/30/2020   HGB 11.5 (L) 12/30/2020   HCT 34.0 (L) 12/30/2020   PLT 198.0 12/30/2020   GLUCOSE 101 (H) 12/30/2020   CHOL 171 09/30/2020   TRIG 93.0 09/30/2020   HDL 42.20 09/30/2020   LDLCALC 110 (H) 09/30/2020   ALT 12 12/30/2020   AST 14 12/30/2020   NA 141 12/30/2020   K 4.3 12/30/2020   CL 105 12/30/2020   CREATININE 1.10 12/30/2020   BUN 17 12/30/2020   CO2 30 12/30/2020   TSH 0.09 (L) 12/30/2020   PSA 0.16 12/30/2020   INR 1.0 06/16/2020   HGBA1C 5.7 09/04/2019    ECHOCARDIOGRAM COMPLETE  Result Date: 06/17/2020    ECHOCARDIOGRAM REPORT   Patient Name:   FARID GRIGORIAN Date of Exam: 06/17/2020 Medical Rec #:  [id number]        Height:       73.0 in Accession #:    [id number]       Weight:       163.4 lb Date of Birth:  [birth year]         BSA:          1.974 m Patient Age:    52 years         BP:           116/70 mmHg Patient Gender: M                HR:           63 bpm. Exam Location:  [facility] Procedure: 2D Echo, Cardiac Doppler and Color Doppler Indications:     Acute myocardial infarction I21.9  History:         Patient has no prior history of Echocardiogram examinations.                  Hypothyroidism.  Sonographer:     Sherrie Sport RDCS (AE) Referring Phys:  [place] Diagnosing Phys: Bartholome Bill MD IMPRESSIONS  1. Left  ventricular ejection fraction, by estimation, is 40 to 45%. The left ventricle has normal function. The left ventricle demonstrates regional wall motion abnormalities (see scoring diagram/findings for description). Left ventricular diastolic parameters were normal.  2. Right ventricular systolic function is normal. The right ventricular size is normal.  3. The mitral valve is grossly normal. Trivial mitral valve regurgitation.  4. The aortic valve is grossly normal. Aortic valve regurgitation is not visualized. FINDINGS  Left Ventricle: Left ventricular ejection fraction, by estimation, is 40 to 45%. The left ventricle has normal function. The left ventricle demonstrates regional wall motion abnormalities. The left ventricular internal cavity size was normal in size. There is no left ventricular hypertrophy. Left ventricular diastolic parameters were normal.  LV Wall Scoring: The mid anteroseptal segment is akinetic. The basal anteroseptal segment, mid inferolateral segment, basal anterior segment, and basal inferoseptal segment are hypokinetic. The basal inferolateral segment, basal anterolateral segment, and basal inferior segment are normal. Right Ventricle: The right ventricular size is normal. No increase in right ventricular wall thickness. Right ventricular systolic function is normal. Left Atrium: Left atrial size was normal in size. Right Atrium: Right atrial size was normal in size. Pericardium: There is no evidence  of pericardial effusion. Mitral Valve: The mitral valve is grossly normal. Trivial mitral valve regurgitation. Tricuspid Valve: The tricuspid valve is not well visualized. Tricuspid valve regurgitation is trivial. Aortic Valve: The aortic valve is grossly normal. Aortic valve regurgitation is not visualized. Aortic valve mean gradient measures 2.0 mmHg. Aortic valve peak gradient measures 3.9 mmHg. Aortic valve area, by VTI measures 3.12 cm. Pulmonic Valve: The pulmonic valve was not well  visualized. Pulmonic valve regurgitation is not visualized. Aorta: The aortic root is normal in size and structure. IAS/Shunts: The interatrial septum was not assessed.  LEFT VENTRICLE PLAX 2D LVIDd:         4.99 cm      Diastology LVIDs:         3.83 cm      LV e' medial:    5.77 cm/s LV PW:         0.66 cm      LV E/e' medial:  8.6 LV IVS:        0.75 cm      LV e' lateral:   4.90 cm/s LVOT diam:     2.00 cm      LV E/e' lateral: 10.1 LV SV:         55 LV SV Index:   28 LVOT Area:     3.14 cm  LV Volumes (MOD) LV vol d, MOD A2C: 69.9 ml LV vol d, MOD A4C: 103.0 ml LV vol s, MOD A2C: 34.9 ml LV vol s, MOD A4C: 61.6 ml LV SV MOD A2C:     35.0 ml LV SV MOD A4C:     103.0 ml LV SV MOD BP:      38.8 ml RIGHT VENTRICLE RV Basal diam:  2.76 cm RV S prime:     13.70 cm/s TAPSE (M-mode): 4.4 cm LEFT ATRIUM           Index       RIGHT ATRIUM           Index LA diam:      3.00 cm 1.52 cm/m  RA Area:     15.00 cm LA Vol (A2C): 47.1 ml 23.86 ml/m RA Volume:   38.30 ml  19.40 ml/m LA Vol (A4C): 15.3 ml 7.75 ml/m  AORTIC VALVE                   PULMONIC VALVE AV Area (Vmax):    2.73 cm    PV Vmax:        0.37 m/s AV Area (Vmean):   2.45 cm    PV Peak grad:   0.6 mmHg AV Area (VTI):     3.12 cm    RVOT Peak grad: 2 mmHg AV Vmax:           98.30 cm/s AV Vmean:          73.350 cm/s AV VTI:            0.178 m AV Peak Grad:      3.9 mmHg AV Mean Grad:      2.0 mmHg LVOT Vmax:         85.50 cm/s LVOT Vmean:        57.100 cm/s LVOT VTI:          0.176 m LVOT/AV VTI ratio: 0.99  AORTA Ao Root diam: 3.63 cm MITRAL VALVE               TRICUSPID VALVE MV Area (PHT): 3.39  cm    TR Peak grad:   18.0 mmHg MV Decel Time: 224 msec    TR Vmax:        212.00 cm/s MV E velocity: 49.70 cm/s MV A velocity: 74.60 cm/s  SHUNTS MV E/A ratio:  0.67        Systemic VTI:  0.18 m                            Systemic Diam: 2.00 cm Bartholome Bill MD Electronically signed by Bartholome Bill MD Signature Date/Time: 06/17/2020/1:18:14 PM    Final         Assessment & Plan:   Problem List Items Addressed This Visit     Anemia    Recent EGD/colonoscopy and video capsule endoscopy - as outlined.  Follow cbc and iron studies.        Relevant Orders   CBC with Differential/Platelet (Completed)   IBC + Ferritin (Completed)   Coronary artery disease with angina pectoris [facility])    Recent admission with anterior STEMI s/p PCI/DES of proximal LAD.  Off Brilinta.  Taking metoprolol and aspirin.  No chest pain now.  Breathing stable.  S/p cardiac rehab.        Health care maintenance    Physical today 12/30/20.  Colonoscopy 05/2018.  Recent video capsule endoscopy as outlined.  Follow psa.       Hypercholesterolemia    On praluent now.  Low cholesterol diet and exercise.  Follow lipid panel.       Relevant Orders   Hepatic function panel (Completed)   TSH (Completed)   Basic metabolic panel (Completed)   Obstructive sleep apnea    Continue cpap.       THYROID CANCER, HX OF    Followed by Dr Forde Dandy. On synthroid.  Follow tsh.       Other Visit Diagnoses     Routine general medical examination at a health care facility    -  Primary   Prostate cancer screening       Relevant Orders   PSA (Completed)   Need for immunization against influenza       Relevant Orders   Flu Vaccine QUAD 78mo+IM (Fluarix, Fluzone & Alfiuria Quad PF) (Completed)        Einar Pheasant, MD

## 2020-12-30 NOTE — Assessment & Plan Note (Addendum)
Physical today 12/30/20.  Colonoscopy 05/2018.  Recent video capsule endoscopy as outlined.  Follow psa.

## 2021-01-02 NOTE — Telephone Encounter (Signed)
Labs placed.

## 2021-01-02 NOTE — Addendum Note (Signed)
Addended by: Lars Masson on: 01/02/2021 11:48 AM   Modules accepted: Orders

## 2021-01-04 ENCOUNTER — Telehealth: Payer: Self-pay

## 2021-01-04 NOTE — Telephone Encounter (Signed)
LMTCB for lab results.  

## 2021-01-05 ENCOUNTER — Encounter: Payer: Self-pay | Admitting: Internal Medicine

## 2021-01-06 ENCOUNTER — Encounter: Payer: Self-pay | Admitting: Internal Medicine

## 2021-01-06 DIAGNOSIS — D509 Iron deficiency anemia, unspecified: Secondary | ICD-10-CM

## 2021-01-07 ENCOUNTER — Encounter: Payer: Self-pay | Admitting: Internal Medicine

## 2021-01-07 NOTE — Telephone Encounter (Signed)
Order placed for hematology referral.  

## 2021-01-07 NOTE — Assessment & Plan Note (Signed)
Continue cpap.  

## 2021-01-07 NOTE — Assessment & Plan Note (Signed)
Followed by Dr South. On synthroid.  Follow tsh.  

## 2021-01-07 NOTE — Assessment & Plan Note (Signed)
Recent EGD/colonoscopy and video capsule endoscopy - as outlined.  Follow cbc and iron studies.

## 2021-01-07 NOTE — Assessment & Plan Note (Signed)
Recent admission with anterior STEMI s/p PCI/DES of proximal LAD.  Off Brilinta.  Taking metoprolol and aspirin.  No chest pain now.  Breathing stable.  S/p cardiac rehab.

## 2021-01-07 NOTE — Assessment & Plan Note (Signed)
On praluent now.  Low cholesterol diet and exercise.  Follow lipid panel.

## 2021-01-17 ENCOUNTER — Other Ambulatory Visit: Payer: Self-pay | Admitting: Dermatology

## 2021-01-17 ENCOUNTER — Other Ambulatory Visit: Payer: Self-pay

## 2021-01-17 MED ORDER — FINASTERIDE 1 MG PO TABS
ORAL_TABLET | Freq: Every day | ORAL | 10 refills | Status: DC
  Filled 2021-01-17: qty 90, 90d supply, fill #0
  Filled 2021-05-19: qty 90, 90d supply, fill #1
  Filled 2021-08-15: qty 90, 90d supply, fill #2
  Filled 2021-11-10: qty 90, 90d supply, fill #3

## 2021-01-18 ENCOUNTER — Other Ambulatory Visit: Payer: Self-pay

## 2021-01-19 ENCOUNTER — Other Ambulatory Visit: Payer: Self-pay

## 2021-01-20 ENCOUNTER — Other Ambulatory Visit: Payer: Self-pay | Admitting: *Deleted

## 2021-01-20 ENCOUNTER — Other Ambulatory Visit: Payer: Self-pay

## 2021-01-20 ENCOUNTER — Inpatient Hospital Stay: Payer: 59 | Attending: Internal Medicine | Admitting: Internal Medicine

## 2021-01-20 ENCOUNTER — Inpatient Hospital Stay: Payer: 59

## 2021-01-20 ENCOUNTER — Encounter: Payer: Self-pay | Admitting: Internal Medicine

## 2021-01-20 DIAGNOSIS — E611 Iron deficiency: Secondary | ICD-10-CM | POA: Insufficient documentation

## 2021-01-20 DIAGNOSIS — Q2730 Arteriovenous malformation, site unspecified: Secondary | ICD-10-CM | POA: Insufficient documentation

## 2021-01-20 DIAGNOSIS — I2510 Atherosclerotic heart disease of native coronary artery without angina pectoris: Secondary | ICD-10-CM | POA: Diagnosis not present

## 2021-01-20 DIAGNOSIS — Z7982 Long term (current) use of aspirin: Secondary | ICD-10-CM | POA: Insufficient documentation

## 2021-01-20 DIAGNOSIS — D509 Iron deficiency anemia, unspecified: Secondary | ICD-10-CM | POA: Insufficient documentation

## 2021-01-20 NOTE — Progress Notes (Signed)
[facility] CONSULT NOTE  Patient Care Team: Einar Pheasant, MD as PCP - General (Internal Medicine) Einar Pheasant, MD (Internal Medicine)  CHIEF COMPLAINTS/PURPOSE OF CONSULTATION: ANEMIA   HEMATOLOGY HISTORY:  # ANEMIA capsule- AVMs- actively bleeding [sep 5176]; EGD-; colonoscopy-< 2017; < 2007- EGD-stretching of esophagus.  # CAD [on asprin 81 mg/day s/p stenting; off brillinta]  HISTORY OF PRESENTING ILLNESS:  Matthew Kelley 60 y.o.  male has been referred to [place] for further evaluation/work-up for anemia.  Patient has a prior history of GI bleed/AVMs more than 15 years ago.  Patient has a history of CAD/MI-was on aspirin and Brilinta.  Currently, taken off Brilinta.  On aspirin.   Patient had a capsule study last month that showed bleeding AVMs.  Patient is considering evaluation at [facility] for possible enteroscopy.  Blood in stools: None Change in bowel habits- None Blood in urine: None Difficulty swallowing: None Abnormal weight loss: None Iron supplementation: for 1 year Prior Blood transfusions: none Bariatric surgery: None EGD- > 10 years- stretching; /Colonoscopy: last 3-5 years  Review of Systems  Constitutional:  Negative for chills, diaphoresis, fever, malaise/fatigue and weight loss.  HENT:  Negative for nosebleeds and sore throat.   Eyes:  Negative for double vision.  Respiratory:  Negative for cough, hemoptysis, sputum production, shortness of breath and wheezing.   Cardiovascular:  Negative for chest pain, palpitations, orthopnea and leg swelling.  Gastrointestinal:  Negative for abdominal pain, blood in stool, constipation, diarrhea, heartburn, melena, nausea and vomiting.  Genitourinary:  Negative for dysuria, frequency and urgency.  Musculoskeletal:  Negative for back pain and joint pain.  Skin: Negative.  Negative for itching and rash.  Neurological:  Negative for dizziness, tingling, focal weakness, weakness and headaches.   Endo/Heme/Allergies:  Does not bruise/bleed easily.  Psychiatric/Behavioral:  Negative for depression. The patient is not nervous/anxious and does not have insomnia.    MEDICAL HISTORY:  Past Medical History:  Diagnosis Date   Blood in stool    h/o   Cancer ([place])    thyroid cancer   H/O total thyroidectomy    History of chicken pox    Hypercholesterolemia    Hypothyroidism    Sleep apnea    Thyroid cancer ([place])    Vertigo     SURGICAL HISTORY: Past Surgical History:  Procedure Laterality Date   CHOLECYSTECTOMY  2000   CORONARY/GRAFT ACUTE MI REVASCULARIZATION N/A 06/16/2020   Procedure: Coronary/Graft Acute MI Revascularization;  Surgeon: [name], MD;  Location: [facility];  Service: Cardiovascular;  Laterality: N/A;   LEFT HEART CATH AND CORONARY ANGIOGRAPHY N/A 06/16/2020   Procedure: LEFT HEART CATH AND CORONARY ANGIOGRAPHY;  Surgeon: [name], MD;  Location: [facility];  Service: Cardiovascular;  Laterality: N/A;   TOTAL THYROIDECTOMY  2009    SOCIAL HISTORY: Social History   Socioeconomic History   Marital status: Married    Spouse name: Not on file   Number of children: Not on file   Years of education: Not on file   Highest education level: Not on file  Occupational History   Not on file  Tobacco Use   Smoking status: Never   Smokeless tobacco: Never  Vaping Use   Vaping Use: Never used  Substance and Sexual Activity   Alcohol use: Yes    Alcohol/week: 0.0 standard drinks   Drug use: No   Sexual activity: Not on file  Other Topics Concern   Not on file  Social History Narrative   **  Merged History Encounter **       Social Determinants of Health   Financial Resource Strain: Not on file  Food Insecurity: Not on file  Transportation Needs: Not on file  Physical Activity: Not on file  Stress: Not on file  Social Connections: Not on file  Intimate Partner Violence: Not on file    FAMILY  HISTORY: Family History  Problem Relation Age of Onset   Hyperlipidemia Father    Heart disease Father     ALLERGIES:  has No Known Allergies.  MEDICATIONS:  Current Outpatient Medications  Medication Sig Dispense Refill   Alirocumab (PRALUENT) 150 MG/ML SOAJ Inject 150 mg subcutaneously every 14 (fourteen) days 2 mL 12   aspirin 81 MG chewable tablet Chew 1 tablet (81 mg total) by mouth daily. 30 tablet 2   cholecalciferol (VITAMIN D) 1000 units tablet Take 1 tablet (1,000 Units total) by mouth daily. 90 tablet 1   finasteride (PROPECIA) 1 MG tablet TAKE 1 TABLET BY MOUTH ONCE DAILY 90 tablet 10   ketoconazole (NIZORAL) 2 % shampoo MASSAGE INTO SCALP ONCE DAILY AS NEEDED. LET SIT SEVERAL MINUTES BEFORE RINSING. 360 mL 3   levothyroxine (SYNTHROID) 175 MCG tablet Take 175 mcg by mouth daily before breakfast.     metoprolol tartrate (LOPRESSOR) 25 MG tablet Take 1 tablet (25 mg total) by mouth 2 (two) times daily 180 tablet 3   Multiple Vitamins-Minerals (PRESERVISION AREDS 2) CAPS      No current facility-administered medications for this visit.      PHYSICAL EXAMINATION:   Vitals:   01/20/21 1359  BP: 116/76  Pulse: 74  Resp: 16  Temp: 98.5 F (36.9 C)  SpO2: 98%   Filed Weights   01/20/21 1359  Weight: 197 lb 3.2 oz (89.4 kg)    Physical Exam Vitals and nursing note reviewed.  HENT:     Head: Normocephalic and atraumatic.     Mouth/Throat:     Pharynx: Oropharynx is clear.  Eyes:     Extraocular Movements: Extraocular movements intact.     Pupils: Pupils are equal, round, and reactive to light.  Cardiovascular:     Rate and Rhythm: Normal rate and regular rhythm.  Abdominal:     Palpations: Abdomen is soft.  Musculoskeletal:        General: Normal range of motion.     Cervical back: Normal range of motion.  Skin:    General: Skin is warm.  Neurological:     General: No focal deficit present.     Mental Status: He is alert and oriented to person, place,  and time.  Psychiatric:        Behavior: Behavior normal.        Judgment: Judgment normal.   LABORATORY DATA:  I have reviewed the data as listed Lab Results  Component Value Date   WBC 3.6 (L) 12/30/2020   HGB 11.5 (L) 12/30/2020   HCT 34.0 (L) 12/30/2020   MCV 86.1 12/30/2020   PLT 198.0 12/30/2020   Recent Labs    06/16/20 1945 06/17/20 0602 06/18/20 0421 08/12/20 0817 12/30/20 0918  NA 139 137 138 142 141  K 3.5 3.8 3.9 4.6 4.3  CL 105 105 108 107 105  CO2 21* 24 24 29 30   GLUCOSE 124* 133* 104* 99 101*  BUN 23 17 20 21 17   CREATININE 1.25* 1.02 1.14 1.13 1.10  CALCIUM 9.0 8.4* 8.5* 8.8 8.7  GFRNONAA >60 >60 >60  --   --  PROT 7.3  --   --  6.4 6.3  ALBUMIN 4.4  --   --  4.2 4.2  AST 22  --   --  12 14  ALT 20  --   --  13 12  ALKPHOS 79  --   --  80 68  BILITOT 1.1  --   --  0.4 0.4  BILIDIR  --   --   --  0.1 0.1     No results found.  Iron deficiency # Anemia- iron deficiency- hemoglobin:11;  Ferritin: 20.  Not significant improvement with oral iron.  # Patient is symptomatic from worsening fatigue-recommend IV iron infusions. Discussed the potential acute infusion reactions with IV iron; which are quite rare.  Patient understands the risk; will proceed with infusions.  #Etiology: Likely secondary to chronic GI bleeding/AVMs-s/p capsule study.  Thank you, Scott for allowing me to participate in the care of your pleasant patient. Please do not hesitate to contact me with questions or concerns in the interim.  # DISPOSITION: No labs today. # venofer weekly [on fridays- pt pref] x4 # follow up in 6 weeks- MD; labs- cbc/possible venofer- Dr.B        All questions were answered. The patient knows to call the clinic with any problems, questions or concerns.      Cammie Sickle, MD 01/20/2021 2:35 PM

## 2021-01-20 NOTE — Progress Notes (Signed)
Patient here for initial visit, he reports feeling fatigues for the past year and sometimes chilled. VSS and WNL.

## 2021-01-20 NOTE — Assessment & Plan Note (Addendum)
#   Anemia- iron deficiency- hemoglobin:11;  Ferritin: 20.  Not significant improvement with oral iron.  # Patient is symptomatic from worsening fatigue-recommend IV iron infusions. Discussed the potential acute infusion reactions with IV iron; which are quite rare.  Patient understands the risk; will proceed with infusions.  #Etiology: Likely secondary to chronic GI bleeding/AVMs-s/p capsule study.  Thank you, Scott for allowing me to participate in the care of your pleasant patient. Please do not hesitate to contact me with questions or concerns in the interim.  # DISPOSITION: No labs today. # venofer weekly [on fridays- pt pref] x4 # follow up in 6 weeks- MD; labs- cbc/possible venofer- Dr.B

## 2021-01-24 ENCOUNTER — Other Ambulatory Visit: Payer: Self-pay

## 2021-01-26 ENCOUNTER — Other Ambulatory Visit: Payer: Self-pay

## 2021-01-27 ENCOUNTER — Inpatient Hospital Stay: Payer: 59

## 2021-01-27 ENCOUNTER — Other Ambulatory Visit: Payer: Self-pay

## 2021-01-27 VITALS — BP 117/71 | HR 70 | Temp 98.7°F | Resp 16

## 2021-01-27 DIAGNOSIS — E7800 Pure hypercholesterolemia, unspecified: Secondary | ICD-10-CM

## 2021-01-27 DIAGNOSIS — R7989 Other specified abnormal findings of blood chemistry: Secondary | ICD-10-CM

## 2021-01-27 DIAGNOSIS — Z7982 Long term (current) use of aspirin: Secondary | ICD-10-CM | POA: Diagnosis not present

## 2021-01-27 DIAGNOSIS — D509 Iron deficiency anemia, unspecified: Secondary | ICD-10-CM | POA: Diagnosis not present

## 2021-01-27 DIAGNOSIS — Z0000 Encounter for general adult medical examination without abnormal findings: Secondary | ICD-10-CM

## 2021-01-27 DIAGNOSIS — E611 Iron deficiency: Secondary | ICD-10-CM

## 2021-01-27 DIAGNOSIS — I2510 Atherosclerotic heart disease of native coronary artery without angina pectoris: Secondary | ICD-10-CM | POA: Diagnosis not present

## 2021-01-27 DIAGNOSIS — Q2730 Arteriovenous malformation, site unspecified: Secondary | ICD-10-CM | POA: Diagnosis not present

## 2021-01-27 MED ORDER — SODIUM CHLORIDE 0.9 % IV SOLN
Freq: Once | INTRAVENOUS | Status: AC
  Filled 2021-01-27: qty 250

## 2021-01-27 MED ORDER — SODIUM CHLORIDE 0.9 % IV SOLN: 200.0000 mg | Freq: Once | INTRAVENOUS | Status: DC

## 2021-01-27 MED ORDER — IRON SUCROSE 20 MG/ML IV SOLN
200.0000 mg | Freq: Once | INTRAVENOUS | Status: AC
  Administered 2021-01-27: 200 mg via INTRAVENOUS
  Filled 2021-01-27: qty 10

## 2021-01-28 ENCOUNTER — Telehealth: Payer: Self-pay | Admitting: Internal Medicine

## 2021-01-28 NOTE — Telephone Encounter (Signed)
Staff message sent back - noted.  Ok.  Thanks.

## 2021-01-30 ENCOUNTER — Other Ambulatory Visit: Payer: Self-pay

## 2021-02-01 ENCOUNTER — Other Ambulatory Visit: Payer: Self-pay

## 2021-02-02 ENCOUNTER — Other Ambulatory Visit: Payer: Self-pay

## 2021-02-03 ENCOUNTER — Inpatient Hospital Stay: Payer: 59

## 2021-02-03 ENCOUNTER — Other Ambulatory Visit: Payer: Self-pay

## 2021-02-03 VITALS — BP 115/72

## 2021-02-03 DIAGNOSIS — E611 Iron deficiency: Secondary | ICD-10-CM

## 2021-02-03 DIAGNOSIS — Q2730 Arteriovenous malformation, site unspecified: Secondary | ICD-10-CM | POA: Diagnosis not present

## 2021-02-03 DIAGNOSIS — Z7982 Long term (current) use of aspirin: Secondary | ICD-10-CM | POA: Diagnosis not present

## 2021-02-03 DIAGNOSIS — I2510 Atherosclerotic heart disease of native coronary artery without angina pectoris: Secondary | ICD-10-CM | POA: Diagnosis not present

## 2021-02-03 DIAGNOSIS — D509 Iron deficiency anemia, unspecified: Secondary | ICD-10-CM | POA: Diagnosis not present

## 2021-02-03 MED ORDER — SODIUM CHLORIDE 0.9 % IV SOLN
Freq: Once | INTRAVENOUS | Status: AC
  Filled 2021-02-03: qty 250

## 2021-02-03 MED ORDER — IRON SUCROSE 20 MG/ML IV SOLN
200.0000 mg | Freq: Once | INTRAVENOUS | Status: AC
  Administered 2021-02-03: 200 mg via INTRAVENOUS
  Filled 2021-02-03: qty 10

## 2021-02-03 MED ORDER — SODIUM CHLORIDE 0.9 % IV SOLN: 200.0000 mg | Freq: Once | INTRAVENOUS | Status: DC

## 2021-02-06 ENCOUNTER — Other Ambulatory Visit: Payer: Self-pay

## 2021-02-08 ENCOUNTER — Other Ambulatory Visit: Payer: Self-pay

## 2021-02-10 ENCOUNTER — Inpatient Hospital Stay: Payer: 59 | Attending: Internal Medicine

## 2021-02-10 ENCOUNTER — Other Ambulatory Visit: Payer: Self-pay

## 2021-02-10 VITALS — BP 124/70 | HR 70 | Temp 97.8°F | Resp 18

## 2021-02-10 DIAGNOSIS — E611 Iron deficiency: Secondary | ICD-10-CM

## 2021-02-10 DIAGNOSIS — D509 Iron deficiency anemia, unspecified: Secondary | ICD-10-CM | POA: Diagnosis not present

## 2021-02-10 MED ORDER — SODIUM CHLORIDE 0.9 % IV SOLN
Freq: Once | INTRAVENOUS | Status: AC
  Filled 2021-02-10: qty 250

## 2021-02-10 MED ORDER — IRON SUCROSE 20 MG/ML IV SOLN
200.0000 mg | Freq: Once | INTRAVENOUS | Status: AC
  Administered 2021-02-10: 200 mg via INTRAVENOUS
  Filled 2021-02-10: qty 10

## 2021-02-10 MED ORDER — SODIUM CHLORIDE 0.9 % IV SOLN: 200.0000 mg | Freq: Once | INTRAVENOUS | Status: DC

## 2021-02-10 NOTE — Patient Instructions (Signed)

## 2021-02-17 ENCOUNTER — Other Ambulatory Visit: Payer: Self-pay

## 2021-02-17 ENCOUNTER — Encounter: Payer: 59 | Admitting: Internal Medicine

## 2021-02-17 ENCOUNTER — Inpatient Hospital Stay: Payer: 59

## 2021-02-17 VITALS — BP 125/74 | HR 72 | Temp 96.3°F | Resp 18

## 2021-02-17 DIAGNOSIS — D509 Iron deficiency anemia, unspecified: Secondary | ICD-10-CM | POA: Diagnosis not present

## 2021-02-17 DIAGNOSIS — H353131 Nonexudative age-related macular degeneration, bilateral, early dry stage: Secondary | ICD-10-CM | POA: Diagnosis not present

## 2021-02-17 DIAGNOSIS — E611 Iron deficiency: Secondary | ICD-10-CM

## 2021-02-17 MED ORDER — SODIUM CHLORIDE 0.9 % IV SOLN: 200.0000 mg | Freq: Once | INTRAVENOUS | Status: DC

## 2021-02-17 MED ORDER — SODIUM CHLORIDE 0.9 % IV SOLN
Freq: Once | INTRAVENOUS | Status: AC
  Filled 2021-02-17: qty 250

## 2021-02-17 MED ORDER — CYCLOSPORINE 0.05 % OP EMUL
OPHTHALMIC | 3 refills | Status: DC
  Filled 2021-02-17: qty 120, 60d supply, fill #0
  Filled 2021-03-24 – 2021-04-04 (×2): qty 180, 90d supply, fill #1
  Filled 2021-08-15: qty 180, 90d supply, fill #2
  Filled 2021-09-21 – 2021-11-10 (×2): qty 180, 90d supply, fill #3
  Filled 2022-02-16: qty 60, 30d supply, fill #4

## 2021-02-17 MED ORDER — IRON SUCROSE 20 MG/ML IV SOLN
200.0000 mg | Freq: Once | INTRAVENOUS | Status: AC
  Administered 2021-02-17: 200 mg via INTRAVENOUS
  Filled 2021-02-17: qty 10

## 2021-02-17 NOTE — Patient Instructions (Signed)
[facility]ER CENTER [place] [facility]  Discharge Instructions: Thank you for choosing [facility] to provide your oncology and hematology care.  If you have a lab appointment with the Cancer Center, please go directly to the Cancer Center and check in at the registration area.  Wear comfortable clothing and clothing appropriate for easy access to any Portacath or PICC line.   We strive to give you quality time with your provider. You may need to reschedule your appointment if you arrive late (15 or more minutes).  Arriving late affects you and other patients whose appointments are after yours.  Also, if you miss three or more appointments without notifying the office, you may be dismissed from the clinic at the provider's discretion.      For prescription refill requests, have your pharmacy contact our office and allow 72 hours for refills to be completed.    Today you received the following chemotherapy and/or immunotherapy agents VENOFER      To help prevent nausea and vomiting after your treatment, we encourage you to take your nausea medication as directed.  BELOW ARE SYMPTOMS THAT SHOULD BE REPORTED IMMEDIATELY: *FEVER GREATER THAN 100.4 F (38 C) OR HIGHER *CHILLS OR SWEATING *NAUSEA AND VOMITING THAT IS NOT CONTROLLED WITH YOUR NAUSEA MEDICATION *UNUSUAL SHORTNESS OF BREATH *UNUSUAL BRUISING OR BLEEDING *URINARY PROBLEMS (pain or burning when urinating, or frequent urination) *BOWEL PROBLEMS (unusual diarrhea, constipation, pain near the anus) TENDERNESS IN MOUTH AND THROAT WITH OR WITHOUT PRESENCE OF ULCERS (sore throat, sores in mouth, or a toothache) UNUSUAL RASH, SWELLING OR PAIN  UNUSUAL VAGINAL DISCHARGE OR ITCHING   Items with * indicate a potential emergency and should be followed up as soon as possible or go to the Emergency Department if any problems should occur.  Please show the CHEMOTHERAPY ALERT CARD or IMMUNOTHERAPY ALERT CARD at check-in to  the Emergency Department and triage nurse.  Should you have questions after your visit or need to cancel or reschedule your appointment, please contact [facility] [place] [facility]  [phone] and follow the prompts.  Office hours are 8:00 a.m. to 4:30 p.m. Monday - Friday. Please note that voicemails left after 4:00 p.m. may not be returned until the following business day.  We are closed weekends and major holidays. You have access to a nurse at all times for urgent questions. Please call the main number to the clinic [phone] and follow the prompts.  For any non-urgent questions, you may also contact your provider using MyChart. We now offer e-Visits for anyone 18 and older to request care online for non-urgent symptoms. For details visit [email or web address].   Also download the MyChart app! Go to the app store, search "MyChart", open the app, select [facility], and log in with your MyChart username and password.  Due to Covid, a mask is required upon entering the hospital/clinic. If you do not have a mask, one will be given to you upon arrival. For doctor visits, patients may have 1 support person aged 18 or older with them. For treatment visits, patients cannot have anyone with them due to current Covid guidelines and our immunocompromised population.   Iron Sucrose Injection What is this medication? IRON SUCROSE (EYE ern SOO krose) treats low levels of iron (iron deficiency anemia) in people with kidney disease. Iron is a mineral that plays an important role in making red blood cells, which carry oxygen from your lungs to the rest of your body. This medicine may   be used for other purposes; ask your health care provider or pharmacist if you have questions. COMMON BRAND NAME(S): Venofer What should I tell my care team before I take this medication? They need to know if you have any of these conditions: Anemia not caused by low iron levels Heart disease High levels  of iron in the blood Kidney disease Liver disease An unusual or allergic reaction to iron, other medications, foods, dyes, or preservatives Pregnant or trying to get pregnant Breast-feeding How should I use this medication? This medication is for infusion into a vein. It is given in a hospital or clinic setting. Talk to your care team about the use of this medication in children. While this medication may be prescribed for children as young as 2 years for selected conditions, precautions do apply. Overdosage: If you think you have taken too much of this medicine contact a poison control center or emergency room at once. NOTE: This medicine is only for you. Do not share this medicine with others. What if I miss a dose? It is important not to miss your dose. Call your care team if you are unable to keep an appointment. What may interact with this medication? Do not take this medication with any of the following: Deferoxamine Dimercaprol Other iron products This medication may also interact with the following: Chloramphenicol Deferasirox This list may not describe all possible interactions. Give your health care provider a list of all the medicines, herbs, non-prescription drugs, or dietary supplements you use. Also tell them if you smoke, drink alcohol, or use illegal drugs. Some items may interact with your medicine. What should I watch for while using this medication? Visit your care team regularly. Tell your care team if your symptoms do not start to get better or if they get worse. You may need blood work done while you are taking this medication. You may need to follow a special diet. Talk to your care team. Foods that contain iron include: whole grains/cereals, dried fruits, beans, or peas, leafy green vegetables, and organ meats (liver, kidney). What side effects may I notice from receiving this medication? Side effects that you should report to your care team as soon as  possible: Allergic reactions--skin rash, itching, hives, swelling of the face, lips, tongue, or throat Low blood pressure--dizziness, feeling faint or lightheaded, blurry vision Shortness of breath Side effects that usually do not require medical attention (report to your care team if they continue or are bothersome): Flushing Headache Joint pain Muscle pain Nausea Pain, redness, or irritation at injection site This list may not describe all possible side effects. Call your doctor for medical advice about side effects. You may report side effects to FDA at 1-800-FDA-1088. Where should I keep my medication? This medication is given in a hospital or clinic and will not be stored at home. NOTE: This sheet is a summary. It may not cover all possible information. If you have questions about this medicine, talk to your doctor, pharmacist, or health care provider.  2022 Elsevier/Gold Standard (2020-08-19 00:00:00)  

## 2021-02-20 ENCOUNTER — Other Ambulatory Visit: Payer: Self-pay

## 2021-02-24 DIAGNOSIS — I2510 Atherosclerotic heart disease of native coronary artery without angina pectoris: Secondary | ICD-10-CM | POA: Diagnosis not present

## 2021-02-24 DIAGNOSIS — I700 Atherosclerosis of aorta: Secondary | ICD-10-CM | POA: Diagnosis not present

## 2021-02-24 DIAGNOSIS — E785 Hyperlipidemia, unspecified: Secondary | ICD-10-CM | POA: Diagnosis not present

## 2021-02-24 DIAGNOSIS — C73 Malignant neoplasm of thyroid gland: Secondary | ICD-10-CM | POA: Diagnosis not present

## 2021-02-24 DIAGNOSIS — D500 Iron deficiency anemia secondary to blood loss (chronic): Secondary | ICD-10-CM | POA: Diagnosis not present

## 2021-02-24 DIAGNOSIS — G4730 Sleep apnea, unspecified: Secondary | ICD-10-CM | POA: Diagnosis not present

## 2021-02-24 DIAGNOSIS — E230 Hypopituitarism: Secondary | ICD-10-CM | POA: Diagnosis not present

## 2021-02-24 DIAGNOSIS — H3530 Unspecified macular degeneration: Secondary | ICD-10-CM | POA: Diagnosis not present

## 2021-02-24 DIAGNOSIS — E890 Postprocedural hypothyroidism: Secondary | ICD-10-CM | POA: Diagnosis not present

## 2021-03-03 ENCOUNTER — Other Ambulatory Visit: Payer: Self-pay

## 2021-03-07 ENCOUNTER — Other Ambulatory Visit: Payer: Self-pay

## 2021-03-07 ENCOUNTER — Ambulatory Visit (INDEPENDENT_AMBULATORY_CARE_PROVIDER_SITE_OTHER): Payer: 59 | Admitting: Dermatology

## 2021-03-07 DIAGNOSIS — L720 Epidermal cyst: Secondary | ICD-10-CM | POA: Diagnosis not present

## 2021-03-07 DIAGNOSIS — D225 Melanocytic nevi of trunk: Secondary | ICD-10-CM

## 2021-03-07 DIAGNOSIS — L820 Inflamed seborrheic keratosis: Secondary | ICD-10-CM | POA: Diagnosis not present

## 2021-03-07 DIAGNOSIS — L80 Vitiligo: Secondary | ICD-10-CM | POA: Diagnosis not present

## 2021-03-07 DIAGNOSIS — W57XXXA Bitten or stung by nonvenomous insect and other nonvenomous arthropods, initial encounter: Secondary | ICD-10-CM

## 2021-03-07 DIAGNOSIS — S30861A Insect bite (nonvenomous) of abdominal wall, initial encounter: Secondary | ICD-10-CM | POA: Diagnosis not present

## 2021-03-07 DIAGNOSIS — L738 Other specified follicular disorders: Secondary | ICD-10-CM

## 2021-03-07 DIAGNOSIS — S40261D Insect bite (nonvenomous) of right shoulder, subsequent encounter: Secondary | ICD-10-CM

## 2021-03-07 DIAGNOSIS — Z7184 Encounter for health counseling related to travel: Secondary | ICD-10-CM

## 2021-03-07 DIAGNOSIS — L700 Acne vulgaris: Secondary | ICD-10-CM | POA: Diagnosis not present

## 2021-03-07 DIAGNOSIS — B001 Herpesviral vesicular dermatitis: Secondary | ICD-10-CM | POA: Diagnosis not present

## 2021-03-07 DIAGNOSIS — D229 Melanocytic nevi, unspecified: Secondary | ICD-10-CM

## 2021-03-07 DIAGNOSIS — Z86018 Personal history of other benign neoplasm: Secondary | ICD-10-CM

## 2021-03-07 DIAGNOSIS — D2262 Melanocytic nevi of left upper limb, including shoulder: Secondary | ICD-10-CM

## 2021-03-07 MED ORDER — OPZELURA 1.5 % EX CREA
1.0000 "application " | TOPICAL_CREAM | Freq: Two times a day (BID) | CUTANEOUS | 3 refills | Status: DC
  Filled 2021-03-07: qty 180, 90d supply, fill #0
  Filled 2021-03-09: qty 60, 30d supply, fill #0
  Filled 2021-03-09: qty 180, 90d supply, fill #0

## 2021-03-07 MED ORDER — ISOTRETINOIN 20 MG PO CAPS
20.0000 mg | ORAL_CAPSULE | Freq: Every day | ORAL | 0 refills | Status: DC
  Filled 2021-03-07: qty 30, 30d supply, fill #0

## 2021-03-07 MED ORDER — VALACYCLOVIR HCL 1 G PO TABS
1000.0000 mg | ORAL_TABLET | Freq: Two times a day (BID) | ORAL | 3 refills | Status: DC
  Filled 2021-03-07: qty 180, 90d supply, fill #0
  Filled 2021-09-21: qty 180, 90d supply, fill #1

## 2021-03-07 MED ORDER — CIPROFLOXACIN HCL 500 MG PO TABS
500.0000 mg | ORAL_TABLET | Freq: Two times a day (BID) | ORAL | 3 refills | Status: DC
Start: 2021-03-07 — End: 2021-06-29
  Filled 2021-03-07: qty 60, 30d supply, fill #0

## 2021-03-07 MED ORDER — DOXYCYCLINE HYCLATE 100 MG PO CAPS
100.0000 mg | ORAL_CAPSULE | Freq: Two times a day (BID) | ORAL | 6 refills | Status: DC
  Filled 2021-03-07 – 2021-03-08 (×2): qty 60, 30d supply, fill #0

## 2021-03-07 MED ORDER — AZITHROMYCIN 250 MG PO TABS
ORAL_TABLET | ORAL | 3 refills | Status: DC
  Filled 2021-03-07: qty 6, 5d supply, fill #0

## 2021-03-07 NOTE — Patient Instructions (Addendum)
Doxycycline should be taken with food to prevent nausea. Do not lay down for 30 minutes after taking. Be cautious with sun exposure and use good sun protection while on this medication. Pregnant women should not take this medication.   While taking isotretinoin, do not share pills and do not donate blood. Generic isotretinoin is best absorbed when taken with a fatty meal. Isotretinoin can make you sensitive to the sun. Daily careful sun protection including sunscreen SPF 30+ when outdoors is recommended.  For Cyst surgery   Pre-Operative Instructions  You are scheduled for a surgical procedure at [facility]. We recommend you read the following instructions. If you have any questions or concerns, please call the office at [phone].  Shower and wash the entire body with soap and water the day of your surgery paying special attention to cleansing at and around the planned surgery site.  Avoid aspirin or aspirin containing products at least fourteen (14) days prior to your surgical procedure and for at least one week (7 Days) after your surgical procedure. If you take aspirin on a regular basis for heart disease or history of stroke or for any other reason, we may recommend you continue taking aspirin but please notify us if you take this on a regular basis. Aspirin can cause more bleeding to occur during surgery as well as prolonged bleeding and bruising after surgery.   Avoid other nonsteroidal pain medications at least one week prior to surgery and at least one week prior to your surgery. These include medications such as Ibuprofen (Motrin, Advil and Nuprin), Naprosyn, Voltaren, Relafen, etc. If medications are used for therapeutic reasons, please inform us as they can cause increased bleeding or prolonged bleeding during and bruising after surgical procedures.   Please advise [place] if you are taking any "blood thinner" medications such as Coumadin or Dipyridamole or Plavix or similar  medications. These cause increased bleeding and prolonged bleeding during procedures and bruising after surgical procedures. We may have to consider discontinuing these medications briefly prior to and shortly after your surgery if safe to do so.   Please inform us of all medications you are currently taking. All medications that are taken regularly should be taken the day of surgery as you always do. Nevertheless, we need to be informed of what medications you are taking prior to surgery to know whether they will affect the procedure or cause any complications.   Please inform us of any medication allergies. Also inform us of whether you have allergies to Latex or rubber products or whether you have had any adverse reaction to Lidocaine or Epinephrine.  Please inform us of any prosthetic or artificial body parts such as artificial heart valve, joint replacements, etc., or similar condition that might require preoperative antibiotics.   We recommend avoidance of alcohol at least two weeks prior to surgery and continued avoidance for at least two weeks after surgery.   We recommend discontinuation of tobacco smoking at least two weeks prior to surgery and continued abstinence for at least two weeks after surgery.  Do not plan strenuous exercise, strenuous work or strenuous lifting for approximately four weeks after your surgery.   We request if you are unable to make your scheduled surgical appointment, please call us at least a week in advance or as soon as you are aware of a problem so that we can cancel or reschedule the appointment.   You MAY TAKE TYLENOL (acetaminophen) for pain as it is not a blood  thinner.   PLEASE PLAN TO BE IN TOWN FOR TWO WEEKS FOLLOWING SURGERY, THIS IS IMPORTANT SO YOU CAN BE CHECKED FOR DRESSING CHANGES, SUTURE REMOVAL AND TO MONITOR FOR POSSIBLE COMPLICATIONS.        Melanoma ABCDEs  Melanoma is the most dangerous type of skin cancer, and is the leading cause  of death from skin disease.  You are more likely to develop melanoma if you: Have light-colored skin, light-colored eyes, or red or blond hair Spend a lot of time in the sun Tan regularly, either outdoors or in a tanning bed Have had blistering sunburns, especially during childhood Have a close family member who has had a melanoma Have atypical moles or large birthmarks  Early detection of melanoma is key since treatment is typically straightforward and cure rates are extremely high if we catch it early.   The first sign of melanoma is often a change in a mole or a new dark spot.  The ABCDE system is a way of remembering the signs of melanoma.  A for asymmetry:  The two halves do not match. B for border:  The edges of the growth are irregular. C for color:  A mixture of colors are present instead of an even brown color. D for diameter:  Melanomas are usually (but not always) greater than 45mm - the size of a pencil eraser. E for evolution:  The spot keeps changing in size, shape, and color.  Please check your skin once per month between visits. You can use a small mirror in front and a large mirror behind you to keep an eye on the back side or your body.   If you see any new or changing lesions before your next follow-up, please call to schedule a visit.  Please continue daily skin protection including broad spectrum sunscreen SPF 30+ to sun-exposed areas, reapplying every 2 hours as needed when you're outdoors.   Staying in the shade or wearing long sleeves, sun glasses (UVA+UVB protection) and wide brim hats (4-inch brim around the entire circumference of the hat) are also recommended for sun protection.          If You Need Anything After Your Visit  If you have any questions or concerns for your doctor, please call our main line at [phone] and press option 4 to reach your doctor's medical assistant. If no one answers, please leave a voicemail as directed and we will return  your call as soon as possible. Messages left after 4 pm will be answered the following business day.   You may also send [place] a message via [place]. We typically respond to MyChart messages within 1-2 business days.  For prescription refills, please ask your pharmacy to contact our office. Our fax number is [phone].  If you have an urgent issue when the clinic is closed that cannot wait until the next business day, you can page your doctor at the number below.    Please note that while we do our best to be available for urgent issues outside of office hours, we are not available 24/7.   If you have an urgent issue and are unable to reach [place], you may choose to seek medical care at your doctor's office, retail clinic, urgent care center, or emergency room.  If you have a medical emergency, please immediately call 911 or go to the emergency department.  Pager Numbers  - Dr. Nehemiah Massed: [phone]  - Dr. Laurence Ferrari: [phone]  - Dr. Nicole Kindred: [phone]  In the event of inclement weather, please call our main line at [phone] for an update on the status of any delays or closures.  Dermatology Medication Tips: Please keep the boxes that topical medications come in in order to help keep track of the instructions about where and how to use these. Pharmacies typically print the medication instructions only on the boxes and not directly on the medication tubes.   If your medication is too expensive, please contact our office at [phone] option 4 or send [place] a message through [place].   We are unable to tell what your co-pay for medications will be in advance as this is different depending on your insurance coverage. However, we may be able to find a substitute medication at lower cost or fill out paperwork to get insurance to cover a needed medication.   If a prior authorization is required to get your medication covered by your insurance company, please allow [place] 1-2 business days to  complete this process.  Drug prices often vary depending on where the prescription is filled and some pharmacies may offer cheaper prices.  The website www.goodrx.com contains coupons for medications through different pharmacies. The prices here do not account for what the cost may be with help from insurance (it may be cheaper with your insurance), but the website can give you the price if you did not use any insurance.  - You can print the associated coupon and take it with your prescription to the pharmacy.  - You may also stop by our office during regular business hours and pick up a GoodRx coupon card.  - If you need your prescription sent electronically to a different pharmacy, notify our office through [facility] or by phone at [phone] option 4.     Si Usted Necesita Algo Despus de Su Visita  Tambin puede enviarnos un mensaje a travs de Pharmacist, community. Por lo general respondemos a los mensajes de MyChart en el transcurso de 1 a 2 das hbiles.  Para renovar recetas, por favor pida a su farmacia que se ponga en contacto con nuestra oficina. Harland Dingwall de fax es [place] [phone].  Si tiene un asunto urgente cuando la clnica est cerrada y que no puede esperar hasta el siguiente da hbil, puede llamar/localizar a su doctor(a) al nmero que aparece a continuacin.   Por favor, tenga en cuenta que aunque hacemos todo lo posible para estar disponibles para asuntos urgentes fuera del horario de [place], no estamos disponibles las 24 horas del da, los 7 das de la [place].   Si tiene un problema urgente y no puede comunicarse con nosotros, puede optar por buscar atencin mdica  en el consultorio de su doctor(a), en una clnica privada, en un centro de atencin urgente o en una sala de emergencias.  Si tiene Engineering geologist, por favor llame inmediatamente al 911 o vaya a la sala de emergencias.  Nmeros de bper  - Dr. Nehemiah Massed: [phone]  - Dra. Moye:  [phone]  - Dra. Nicole Kindred: [phone]  En caso de inclemencias del [place], por favor llame a Johnsie Kindred principal al [phone] para una actualizacin sobre el [place] de cualquier retraso o cierre.  Consejos para la medicacin en dermatologa: Por favor, guarde las cajas en las que vienen los medicamentos de uso tpico para ayudarle a seguir las instrucciones sobre dnde y cmo usarlos. Las farmacias generalmente imprimen las instrucciones del medicamento slo en las cajas y no directamente en los tubos del [place].  Si su medicamento es muy caro, por favor, pngase en contacto con Zigmund Daniel llamando al [phone] y presione la opcin 4 o envenos un mensaje a travs de Pharmacist, community.   No podemos decirle cul ser su copago por los medicamentos por adelantado ya que esto es diferente dependiendo de la cobertura de su seguro. Sin embargo, es posible que podamos encontrar un medicamento sustituto a Electrical engineer un formulario para que el seguro cubra el medicamento que se considera necesario.   Si se requiere una autorizacin previa para que su compaa de seguros [place] su medicamento, por favor permtanos de 1 a 2 das hbiles para completar este proceso.  Los precios de los medicamentos varan con frecuencia dependiendo del Environmental consultant de dnde se surte la receta y alguna farmacias pueden ofrecer precios ms baratos.  El sitio web www.goodrx.com tiene cupones para medicamentos de Airline pilot. Los precios aqu no tienen en cuenta lo que podra costar con la ayuda del seguro (puede ser ms barato con su seguro), pero el sitio web puede darle el precio si no utiliz Research scientist (physical sciences).  - Puede imprimir el cupn correspondiente y llevarlo con su receta a la farmacia.  - Tambin puede pasar por nuestra oficina durante el horario de atencin regular y Charity fundraiser una tarjeta de cupones de GoodRx.  - Si necesita que su receta se enve electrnicamente a una farmacia diferente,  informe a nuestra oficina a travs de MyChart de [place] o por telfono llamando al [phone] y presione la opcin 4.

## 2021-03-07 NOTE — Progress Notes (Signed)
Follow-Up Visit   Subjective  Matthew Kelley is a 60 y.o. male who presents for the following: Follow-up (Patient here today to discuss vitiligo at wrist and neck, acne, occasional fever blisters inside mouth, frequent tick bites he is getting on his property and travel prophylaxis for up coming trip. ).  He has irritated growths on R temple hairline. Cyst on neck is growing. Has used tretinoin, doxycycline, and BPO for acne in past.   The following portions of the chart were reviewed this encounter and updated as appropriate:      Review of Systems: No other skin or systemic complaints except as noted in HPI or Assessment and Plan.   Objective  Well appearing patient in no apparent distress; mood and affect are within normal limits.  A focused examination was performed including face, neck, back, arms, chest, wrist. Relevant physical exam findings are noted in the Assessment and Plan.  b/l wrist,anterior / posterior neck, b/l axilla Depigmented patches at axilla, anterior and posterior neck, and bilateral wrist.            Right Abdomen (side) - Upper Resolved today  body Clear today  Mid Lower Vermilion Lip Clear today  face Scattered closed comedones.  right temporal hairline x 3 (3) Erythematous keratotic or waxy stuck-on papules  left lower neck 0.7 cm firm subcutaneous nodule.   right abdomen 1.5 mm medium dark brown macule   Left posterior upper arm 2 mm medium brown macule   Assessment & Plan  Vitiligo b/l wrist,anterior / posterior neck, b/l axilla  Reviewed chronic nature, no cure and can be difficult to treat.  Vitiligo is an autoimmune condition which causes loss of skin pigment and is commonly seen on the face and may also involve areas of trauma like hands, elbows, knees, and ankles.  Treatments include topical steroids and other topical anti-inflammatory ointments/creams and topical and oral Jak inhibitors.  Sometimes narrow band UV light  therapy or Xtrac laser is helpful, both of which require twice weekly treatments for at least 3-6 months.  Antioxidant vitamins, such as Vitamins A,C,E,D, Folic Acid and B12 may be added to enhance treatment.  Start Opzelura 1.5 % cream - apply topically twice daily to aa's of body .  3 month supply with 1 year rf    Related Medications Ruxolitinib Phosphate (OPZELURA) 1.5 % CREA Apply 1 application topically in the morning and at bedtime for 1 dose. To wrist and neck for vitiligo  Tick bite of right shoulder, subsequent encounter Right Abdomen (side) - Upper  Patient [place] getting [place] ticks [place]  Request medication to prevent ECM/lyme disease  After tick bite, start doxycycline 100 mg - take 1 cap by mouth bid with food x 2 weeks.  Do not take while taking isotretinoin due to drug interaction.  Doxycycline should be taken with food to prevent nausea. Do not lay down for 30 minutes after taking. Be cautious with sun exposure and use good sun protection while on this medication. Pregnant women should not take this medication.    doxycycline (MONODOX) 100 MG capsule - Right Abdomen (side) - Upper Take 1 capsule (100 mg total) by mouth 2 (two) times daily. Take with food  Encounter for health counseling related to travel body  Patient is planning to travel outside of country    Azithromycin 250 mg tablet (Z-pack) - take po as directed   Cipro 500 mg tablet - Take  1 tab po bid as directed.     azithromycin (ZITHROMAX Z-PAK) 250 MG tablet - body Take po as directed  ciprofloxacin (CIPRO) 500 MG tablet - body Take 1 tablet (500 mg total) by mouth 2 (two) times daily.  Fever blister Mid Lower Vermilion Lip  Patient reports history of fever blisters at lips, needs rfs of Valtrex   Upon onset of prodrome (tingling, burning) start valacyclovir 1000 mg tablet by mouth bid as directed.   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  It is infectious and can be spread from person to person by direct contact.  It is not curable, but is treatable with topical and oral medication.     valACYclovir (VALTREX) 1000 MG tablet - Mid Lower Vermilion Lip Take 1 tablet (1,000 mg total) by mouth 2 (two) times daily.  Acne vulgaris face  Chronic and persistent  Labs reviewed for 09/30/20 lipid panel and 12/30/20 hepatic were normal  Reviewed potential side effects of isotretinoin including xerosis, cheilitis, hepatitis, hyperlipidemia, and severe birth defects if taken by a pregnant woman. Reviewed reports of suicidal ideation in those with a history of depression while taking isotretinoin and reports of diagnosis of inflammatory bowl disease while taking isotretinoin as well as the lack of evidence for a causal relationship between isotretinoin, depression and IBD. Patient advised to reach out with any questions or concerns. Patient advised not to share pills or donate blood while on treatment or for one month after completing treatment.  Patient confirmed in iPledge and isotretinoin sent to pharmacy. Ipledge #[id number] [facility] employee pharmacy   Start Absorica 20 mg by mouth daily. 30 tabs no refills      ISOtretinoin (ABSORICA) 20 MG capsule - face Take 1 capsule (20 mg total) by mouth daily.  Inflamed seborrheic keratosis right temporal hairline x 3  Destruction of lesion - right temporal hairline x 3  Destruction method: cryotherapy   Informed consent: discussed and consent obtained   Lesion destroyed using liquid nitrogen: Yes   Region frozen until ice ball extended beyond lesion: Yes   Outcome: patient tolerated procedure well with no complications   Post-procedure details: wound care instructions given   Additional details:   Prior to procedure, discussed risks of blister formation, small wound, skin dyspigmentation, or rare scar following cryotherapy. Recommend Vaseline ointment to treated areas while healing.   Epidermal inclusion cyst left lower neck  Cyst with symptoms and/or recent change.  Discussed surgical excision to remove, including resulting scar and possible recurrence.  Patient will schedule for surgery. Pre-op information given.   Patient will schedule surgery   Nevus (2) Left posterior upper arm; right abdomen  Benign-appearing.  Observation.  Call clinic for new or changing lesions.  Recommend daily use of broad spectrum spf 30+ sunscreen to sun-exposed areas.    Melanocytic Nevi - Tan-brown and/or pink-flesh-colored symmetric macules and papules - Benign appearing on exam today - Observation - Call clinic for new or changing moles - Recommend daily use of broad spectrum spf 30+ sunscreen to sun-exposed areas.   Sebaceous Hyperplasia - Small yellow papules with a central dell L upper nasal dorsum - Benign - Observe  History of Dysplastic Nevi - No evidence of recurrence today - Recommend regular full body skin exams - Recommend daily broad spectrum sunscreen SPF 30+ to sun-exposed areas, reapply  every 2 hours as needed.  - Call if any new or changing lesions are noted between office visits   Return for 30 day isotretinoin follow up, cyst removal surgery . I, Ruthell Rummage, CMA, am acting as scribe for Brendolyn Patty, MD.  Documentation: I have reviewed the above documentation for accuracy and completeness, and I agree with the above.  Brendolyn Patty MD

## 2021-03-08 ENCOUNTER — Other Ambulatory Visit: Payer: Self-pay

## 2021-03-08 ENCOUNTER — Encounter: Payer: Self-pay | Admitting: Internal Medicine

## 2021-03-09 ENCOUNTER — Encounter: Payer: Self-pay | Admitting: Internal Medicine

## 2021-03-09 ENCOUNTER — Other Ambulatory Visit: Payer: Self-pay

## 2021-03-10 ENCOUNTER — Inpatient Hospital Stay: Payer: 59 | Attending: Internal Medicine

## 2021-03-10 ENCOUNTER — Other Ambulatory Visit: Payer: Self-pay

## 2021-03-10 ENCOUNTER — Inpatient Hospital Stay: Payer: 59

## 2021-03-10 ENCOUNTER — Other Ambulatory Visit: Payer: 59

## 2021-03-10 ENCOUNTER — Inpatient Hospital Stay (HOSPITAL_BASED_OUTPATIENT_CLINIC_OR_DEPARTMENT_OTHER): Payer: 59 | Admitting: Internal Medicine

## 2021-03-10 DIAGNOSIS — R7989 Other specified abnormal findings of blood chemistry: Secondary | ICD-10-CM

## 2021-03-10 DIAGNOSIS — E611 Iron deficiency: Secondary | ICD-10-CM

## 2021-03-10 DIAGNOSIS — D509 Iron deficiency anemia, unspecified: Secondary | ICD-10-CM | POA: Diagnosis not present

## 2021-03-10 DIAGNOSIS — Z0000 Encounter for general adult medical examination without abnormal findings: Secondary | ICD-10-CM

## 2021-03-10 DIAGNOSIS — E7800 Pure hypercholesterolemia, unspecified: Secondary | ICD-10-CM | POA: Insufficient documentation

## 2021-03-10 LAB — LIPID PANEL
Cholesterol: 170 mg/dL (ref 0–200)
HDL: 49 mg/dL (ref >40)
LDL Cholesterol: 110 mg/dL — ABNORMAL HIGH (ref 0–99)
Total CHOL/HDL Ratio: 3.5 RATIO
Triglycerides: 56 mg/dL (ref <150)
VLDL: 11 mg/dL (ref 0–40)

## 2021-03-10 LAB — COMPREHENSIVE METABOLIC PANEL
ALT: 29 U/L (ref 0–44)
AST: 19 U/L (ref 15–41)
Albumin: 4.2 g/dL (ref 3.5–5.0)
Alkaline Phosphatase: 69 U/L (ref 38–126)
Anion gap: 6 (ref 5–15)
BUN: 20 mg/dL (ref 8–23)
CO2: 28 mmol/L (ref 22–32)
Calcium: 8.5 mg/dL — ABNORMAL LOW (ref 8.9–10.3)
Chloride: 105 mmol/L (ref 98–111)
Creatinine, Ser: 1.12 mg/dL (ref 0.61–1.24)
GFR, Estimated: >60 mL/min (ref >60)
Glucose, Bld: 113 mg/dL — ABNORMAL HIGH (ref 70–99)
Potassium: 4.3 mmol/L (ref 3.5–5.1)
Sodium: 139 mmol/L (ref 135–145)
Total Bilirubin: 0.4 mg/dL (ref 0.3–1.2)
Total Protein: 6.8 g/dL (ref 6.5–8.1)

## 2021-03-10 LAB — CBC WITH DIFFERENTIAL/PLATELET
Abs Immature Granulocytes: 0.01 10*3/uL (ref 0.00–0.07)
Basophils Absolute: 0 10*3/uL (ref 0.0–0.1)
Basophils Relative: 1 %
Eosinophils Absolute: 0.2 10*3/uL (ref 0.0–0.5)
Eosinophils Relative: 6 %
HCT: 37.7 % — ABNORMAL LOW (ref 39.0–52.0)
Hemoglobin: 12.4 g/dL — ABNORMAL LOW (ref 13.0–17.0)
Immature Granulocytes: 0 %
Lymphocytes Relative: 29 %
Lymphs Abs: 1.2 10*3/uL (ref 0.7–4.0)
MCH: 28.6 pg (ref 26.0–34.0)
MCHC: 32.9 g/dL (ref 30.0–36.0)
MCV: 86.9 fL (ref 80.0–100.0)
Monocytes Absolute: 0.3 10*3/uL (ref 0.1–1.0)
Monocytes Relative: 8 %
Neutro Abs: 2.4 10*3/uL (ref 1.7–7.7)
Neutrophils Relative %: 56 %
Platelets: 201 10*3/uL (ref 150–400)
RBC: 4.34 MIL/uL (ref 4.22–5.81)
RDW: 13.7 % (ref 11.5–15.5)
WBC: 4.2 10*3/uL (ref 4.0–10.5)
nRBC: 0 % (ref 0.0–0.2)

## 2021-03-10 MED ORDER — SODIUM CHLORIDE 0.9 % IV SOLN: 200.0000 mg | Freq: Once | INTRAVENOUS | Status: DC

## 2021-03-10 MED ORDER — IRON SUCROSE 20 MG/ML IV SOLN
200.0000 mg | Freq: Once | INTRAVENOUS | Status: AC
  Administered 2021-03-10: 200 mg via INTRAVENOUS
  Filled 2021-03-10: qty 10

## 2021-03-10 MED ORDER — SODIUM CHLORIDE 0.9 % IV SOLN
Freq: Once | INTRAVENOUS | Status: AC
Start: 2021-03-10 — End: 2021-03-10
  Filled 2021-03-10: qty 250

## 2021-03-10 NOTE — Assessment & Plan Note (Addendum)
#   Anemia- iron deficiency- hemoglobin:11;  Ferritin: 20. S/p IV Venofer x4.  Hemoglobin today improved at 12.5.  Proceed with Venofer today; and 1 more.  #Etiology: Likely secondary to chronic GI bleeding/AVMs-s/p capsule study- Awaiting with [facility] evaluation [on asprni CAD]   # DISPOSITION:  # Venofer todday # venofer 1-2 weeks [on fridays- pt pref]  # follow up in 3 months- MD; labs- cbc/bmp; iron studies/ferritin-possible venofer- Dr.B

## 2021-03-10 NOTE — Progress Notes (Signed)
[name] CONSULT NOTE  Patient Care Team: Einar Pheasant, MD as PCP - General (Internal Medicine) Einar Pheasant, MD (Internal Medicine)  CHIEF COMPLAINTS/PURPOSE OF CONSULTATION: ANEMIA   HEMATOLOGY HISTORY:  # ANEMIA capsule- AVMs- actively bleeding [sep 2423]; EGD-; colonoscopy-< 2017; < 2007- EGD-stretching of esophagus. prior history of GI bleed/AVMs more than 15 years ago.  # CAD [on asprin 81 mg/day s/p stenting; off brillinta]  HISTORY OF PRESENTING ILLNESS:  Matthew Kelley 59 y.o.  male with symptomatic iron deficiency [with poor response to p.o. iron] is here for follow-up.  Patient s/p IV iron infusion negative.  Notes to have improvement of his energy levels.  Review of Systems  Constitutional:  Negative for chills, diaphoresis, fever, malaise/fatigue and weight loss.  HENT:  Negative for nosebleeds and sore throat.   Eyes:  Negative for double vision.  Respiratory:  Negative for cough, hemoptysis, sputum production, shortness of breath and wheezing.   Cardiovascular:  Negative for chest pain, palpitations, orthopnea and leg swelling.  Gastrointestinal:  Negative for abdominal pain, blood in stool, constipation, diarrhea, heartburn, melena, nausea and vomiting.  Genitourinary:  Negative for dysuria, frequency and urgency.  Musculoskeletal:  Negative for back pain and joint pain.  Skin: Negative.  Negative for itching and rash.  Neurological:  Negative for dizziness, tingling, focal weakness, weakness and headaches.  Endo/Heme/Allergies:  Does not bruise/bleed easily.  Psychiatric/Behavioral:  Negative for depression. The patient is not nervous/anxious and does not have insomnia.    MEDICAL HISTORY:  Past Medical History:  Diagnosis Date   Blood in stool    h/o   Cancer ([place])    thyroid cancer   H/O total thyroidectomy    History of chicken pox    Hypercholesterolemia    Hypothyroidism    Sleep apnea    Thyroid cancer ([place])    Vertigo      SURGICAL HISTORY: Past Surgical History:  Procedure Laterality Date   CHOLECYSTECTOMY  2000   CORONARY/GRAFT ACUTE MI REVASCULARIZATION N/A 06/16/2020   Procedure: Coronary/Graft Acute MI Revascularization;  Surgeon: [name], MD;  Location: [facility];  Service: Cardiovascular;  Laterality: N/A;   LEFT HEART CATH AND CORONARY ANGIOGRAPHY N/A 06/16/2020   Procedure: LEFT HEART CATH AND CORONARY ANGIOGRAPHY;  Surgeon: [name], MD;  Location: [facility];  Service: Cardiovascular;  Laterality: N/A;   TOTAL THYROIDECTOMY  2009    SOCIAL HISTORY: Social History   Socioeconomic History   Marital status: Married    Spouse name: Not on file   Number of children: Not on file   Years of education: Not on file   Highest education level: Not on file  Occupational History   Not on file  Tobacco Use   Smoking status: Never   Smokeless tobacco: Never  Vaping Use   Vaping Use: Never used  Substance and Sexual Activity   Alcohol use: Yes    Alcohol/week: 0.0 standard drinks   Drug use: No   Sexual activity: Not on file  Other Topics Concern   Not on file  Social History Narrative   ** Merged History Encounter **       Social Determinants of Health   Financial Resource Strain: Not on file  Food Insecurity: Not on file  Transportation Needs: Not on file  Physical Activity: Not on file  Stress: Not on file  Social Connections: Not on file  Intimate Partner Violence: Not on file    FAMILY HISTORY: Family History  Problem Relation Age of Onset   Hyperlipidemia Father    Heart disease Father     ALLERGIES:  has No Known Allergies.  MEDICATIONS:  Current Outpatient Medications  Medication Sig Dispense Refill   Alirocumab (PRALUENT) 150 MG/ML SOAJ Inject 150 mg subcutaneously every 14 (fourteen) days 2 mL 12   aspirin 81 MG chewable tablet Chew 1 tablet (81 mg total) by mouth daily. 30 tablet 2   azithromycin (ZITHROMAX Z-PAK) 250  MG tablet Take 2 tablets by mouth on day 1 then 1 tablet daily on days 2-5 6 each 3   cholecalciferol (VITAMIN D) 1000 units tablet Take 1 tablet (1,000 Units total) by mouth daily. 90 tablet 1   ciprofloxacin (CIPRO) 500 MG tablet Take 1 tablet (500 mg total) by mouth 2 (two) times daily. 60 tablet 3   cycloSPORINE (RESTASIS) 0.05 % ophthalmic emulsion PLACE ONE DROP IN BOTH EYES TWICE A DAY 180 each 3   doxycycline (VIBRAMYCIN) 100 MG capsule Take 1 capsule (100 mg total) by mouth 2 (two) times daily. 60 capsule 6   finasteride (PROPECIA) 1 MG tablet TAKE 1 TABLET BY MOUTH ONCE DAILY 90 tablet 10   ISOtretinoin (ABSORICA) 20 MG capsule Take 1 capsule (20 mg total) by mouth daily. 30 capsule 0   ketoconazole (NIZORAL) 2 % shampoo MASSAGE INTO SCALP ONCE DAILY AS NEEDED. LET SIT SEVERAL MINUTES BEFORE RINSING. 360 mL 3   levothyroxine (SYNTHROID) 175 MCG tablet Take 175 mcg by mouth daily before breakfast.     metoprolol tartrate (LOPRESSOR) 25 MG tablet Take 1 tablet (25 mg total) by mouth 2 (two) times daily 180 tablet 3   Multiple Vitamins-Minerals (PRESERVISION AREDS 2) CAPS      Ruxolitinib Phosphate (OPZELURA) 1.5 % CREA Apply 1 application topically in the morning and at bedtime for 1 dose. To wrist and neck for vitiligo 180 g 3   valACYclovir (VALTREX) 1000 MG tablet Take 1 tablet (1,000 mg total) by mouth 2 (two) times daily. 180 tablet 3   No current facility-administered medications for this visit.      PHYSICAL EXAMINATION:   Vitals:   03/10/21 0857  BP: 123/79  Pulse: 66  Resp: 18  Temp: (!) 96.9 F (36.1 C)   Filed Weights   03/10/21 0857  Weight: 195 lb (88.5 kg)    Physical Exam Vitals and nursing note reviewed.  HENT:     Head: Normocephalic and atraumatic.     Mouth/Throat:     Pharynx: Oropharynx is clear.  Eyes:     Extraocular Movements: Extraocular movements intact.     Pupils: Pupils are equal, round, and reactive to light.  Cardiovascular:     Rate  and Rhythm: Normal rate and regular rhythm.  Abdominal:     Palpations: Abdomen is soft.  Musculoskeletal:        General: Normal range of motion.     Cervical back: Normal range of motion.  Skin:    General: Skin is warm.  Neurological:     General: No focal deficit present.     Mental Status: He is alert and oriented to person, place, and time.  Psychiatric:        Behavior: Behavior normal.        Judgment: Judgment normal.   LABORATORY DATA:  I have reviewed the data as listed Lab Results  Component Value Date   WBC 4.2 03/10/2021   HGB 12.4 (L) 03/10/2021   HCT 37.7 (L) 03/10/2021  MCV 86.9 03/10/2021   PLT 201 03/10/2021   Recent Labs    06/17/20 0602 06/18/20 0421 08/12/20 0817 12/30/20 0918 03/10/21 0817  NA 137 138 142 141 139  K 3.8 3.9 4.6 4.3 4.3  CL 105 108 107 105 105  CO2 24 24 29 30 28   GLUCOSE 133* 104* 99 101* 113*  BUN 17 20 21 17 20   CREATININE 1.02 1.14 1.13 1.10 1.12  CALCIUM 8.4* 8.5* 8.8 8.7 8.5*  GFRNONAA >60 >60  --   --  >60  PROT  --   --  6.4 6.3 6.8  ALBUMIN  --   --  4.2 4.2 4.2  AST  --   --  12 14 19   ALT  --   --  13 12 29   ALKPHOS  --   --  80 68 69  BILITOT  --   --  0.4 0.4 0.4  BILIDIR  --   --  0.1 0.1  --      No results found.  Iron deficiency # Anemia- iron deficiency- hemoglobin:11;  Ferritin: 20. S/p IV Venofer x4.  Hemoglobin today improved at 12.5.  Proceed with Venofer today; and 1 more.  #Etiology: Likely secondary to chronic GI bleeding/AVMs-s/p capsule study- Awaiting with [facility] evaluation [on asprni CAD]   # DISPOSITION:  # Venofer todday # venofer 1-2 weeks [on fridays- pt pref]  # follow up in 3 months- MD; labs- cbc/bmp; iron studies/ferritin-possible venofer- Dr.B        All questions were answered. The patient knows to call the clinic with any problems, questions or concerns.      Cammie Sickle, MD 03/10/2021 1:09 PM

## 2021-03-10 NOTE — Patient Instructions (Signed)
[facility]  Discharge Instructions: ?Thank you for choosing [facility] to provide your oncology and hematology care.  ?If you have a lab appointment with the Cancer Center, please go directly to the Cancer Center and check in at the registration area. ? ?Wear comfortable clothing and clothing appropriate for easy access to any Portacath or PICC line.  ? ?We strive to give you quality time with your provider. You may need to reschedule your appointment if you arrive late (15 or more minutes).  Arriving late affects you and other patients whose appointments are after yours.  Also, if you miss three or more appointments without notifying the office, you may be dismissed from the clinic at the provider?s discretion.    ?  ?For prescription refill requests, have your pharmacy contact our office and allow 72 hours for refills to be completed.   ? ?Today you received the following chemotherapy and/or immunotherapy agents VENOFER    ?  ?To help prevent nausea and vomiting after your treatment, we encourage you to take your nausea medication as directed. ? ?BELOW ARE SYMPTOMS THAT SHOULD BE REPORTED IMMEDIATELY: ?*FEVER GREATER THAN 100.4 F (38 ?C) OR HIGHER ?*CHILLS OR SWEATING ?*NAUSEA AND VOMITING THAT IS NOT CONTROLLED WITH YOUR NAUSEA MEDICATION ?*UNUSUAL SHORTNESS OF BREATH ?*UNUSUAL BRUISING OR BLEEDING ?*URINARY PROBLEMS (pain or burning when urinating, or frequent urination) ?*BOWEL PROBLEMS (unusual diarrhea, constipation, pain near the anus) ?TENDERNESS IN MOUTH AND THROAT WITH OR WITHOUT PRESENCE OF ULCERS (sore throat, sores in mouth, or a toothache) ?UNUSUAL RASH, SWELLING OR PAIN  ?UNUSUAL VAGINAL DISCHARGE OR ITCHING  ? ?Items with * indicate a potential emergency and should be followed up as soon as possible or go to the Emergency Department if any problems should occur. ? ?Please show the CHEMOTHERAPY ALERT CARD or IMMUNOTHERAPY ALERT CARD at check-in to the  Emergency Department and triage nurse. ? ?Should you have questions after your visit or need to cancel or reschedule your appointment, please contact [facility]  [phone] and follow the prompts.  Office hours are 8:00 a.m. to 4:30 p.m. Monday - Friday. Please note that voicemails left after 4:00 p.m. may not be returned until the following business day.  We are closed weekends and major holidays. You have access to a nurse at all times for urgent questions. Please call the main number to the clinic [phone] and follow the prompts. ? ?For any non-urgent questions, you may also contact your provider using MyChart. We now offer e-Visits for anyone 18 and older to request care online for non-urgent symptoms. For details visit [email or web address]. ?  ?Also download the MyChart app! Go to the app store, search "MyChart", open the app, select , and log in with your MyChart username and password. ? ?Due to Covid, a mask is required upon entering the hospital/clinic. If you do not have a mask, one will be given to you upon arrival. For doctor visits, patients may have 1 support person aged 18 or older with them. For treatment visits, patients cannot have anyone with them due to current Covid guidelines and our immunocompromised population.  ? ?Iron Sucrose Injection ?What is this medication? ?IRON SUCROSE (EYE ern SOO krose) treats low levels of iron (iron deficiency anemia) in people with kidney disease. Iron is a mineral that plays an important role in making red blood cells, which carry oxygen from your lungs to the rest of your body. ?This medicine may   be used for other purposes; ask your health care provider or pharmacist if you have questions. ?COMMON BRAND NAME(S): Venofer ?What should I tell my care team before I take this medication? ?They need to know if you have any of these conditions: ?Anemia not caused by low iron levels ?Heart disease ?High levels of  iron in the blood ?Kidney disease ?Liver disease ?An unusual or allergic reaction to iron, other medications, foods, dyes, or preservatives ?Pregnant or trying to get pregnant ?Breast-feeding ?How should I use this medication? ?This medication is for infusion into a vein. It is given in a hospital or clinic setting. ?Talk to your care team about the use of this medication in children. While this medication may be prescribed for children as young as 2 years for selected conditions, precautions do apply. ?Overdosage: If you think you have taken too much of this medicine contact a poison control center or emergency room at once. ?NOTE: This medicine is only for you. Do not share this medicine with others. ?What if I miss a dose? ?It is important not to miss your dose. Call your care team if you are unable to keep an appointment. ?What may interact with this medication? ?Do not take this medication with any of the following: ?Deferoxamine ?Dimercaprol ?Other iron products ?This medication may also interact with the following: ?Chloramphenicol ?Deferasirox ?This list may not describe all possible interactions. Give your health care provider a list of all the medicines, herbs, non-prescription drugs, or dietary supplements you use. Also tell them if you smoke, drink alcohol, or use illegal drugs. Some items may interact with your medicine. ?What should I watch for while using this medication? ?Visit your care team regularly. Tell your care team if your symptoms do not start to get better or if they get worse. You may need blood work done while you are taking this medication. ?You may need to follow a special diet. Talk to your care team. Foods that contain iron include: whole grains/cereals, dried fruits, beans, or peas, leafy green vegetables, and organ meats (liver, kidney). ?What side effects may I notice from receiving this medication? ?Side effects that you should report to your care team as soon as  possible: ?Allergic reactions--skin rash, itching, hives, swelling of the face, lips, tongue, or throat ?Low blood pressure--dizziness, feeling faint or lightheaded, blurry vision ?Shortness of breath ?Side effects that usually do not require medical attention (report to your care team if they continue or are bothersome): ?Flushing ?Headache ?Joint pain ?Muscle pain ?Nausea ?Pain, redness, or irritation at injection site ?This list may not describe all possible side effects. Call your doctor for medical advice about side effects. You may report side effects to FDA at 1-800-FDA-1088. ?Where should I keep my medication? ?This medication is given in a hospital or clinic and will not be stored at home. ?NOTE: This sheet is a summary. It may not cover all possible information. If you have questions about this medicine, talk to your doctor, pharmacist, or health care provider. ?? 2022 Elsevier/Gold Standard (2020-08-19 00:00:00) ? ?

## 2021-03-13 ENCOUNTER — Other Ambulatory Visit: Payer: Self-pay | Admitting: Dermatology

## 2021-03-13 ENCOUNTER — Other Ambulatory Visit: Payer: Self-pay

## 2021-03-13 MED ORDER — HYDROCOD POLST-CPM POLST ER 10-8 MG/5ML PO SUER: 5.0000 mL | Freq: Every evening | ORAL | 0 refills | Status: AC | PRN

## 2021-03-13 MED ORDER — PRALUENT 150 MG/ML ~~LOC~~ SOAJ
SUBCUTANEOUS | 4 refills | Status: DC
  Filled 2021-03-13 – 2021-04-04 (×2): qty 6, 84d supply, fill #0
  Filled 2021-07-21: qty 6, 84d supply, fill #1
  Filled 2021-09-21: qty 6, 84d supply, fill #2
  Filled 2022-01-12: qty 6, 84d supply, fill #3

## 2021-03-13 MED ORDER — ATORVASTATIN CALCIUM 10 MG PO TABS
10.0000 mg | ORAL_TABLET | Freq: Every day | ORAL | 4 refills | Status: DC
  Filled 2021-03-13: qty 90, 90d supply, fill #0

## 2021-03-13 MED ORDER — METOPROLOL TARTRATE 25 MG PO TABS
25.0000 mg | ORAL_TABLET | Freq: Two times a day (BID) | ORAL | 3 refills | Status: DC
  Filled 2021-03-13: qty 180, 90d supply, fill #0

## 2021-03-14 ENCOUNTER — Other Ambulatory Visit: Payer: Self-pay

## 2021-03-17 ENCOUNTER — Ambulatory Visit: Payer: 59

## 2021-03-24 ENCOUNTER — Inpatient Hospital Stay: Payer: 59

## 2021-03-24 ENCOUNTER — Other Ambulatory Visit: Payer: Self-pay

## 2021-03-24 VITALS — BP 126/75 | HR 64 | Temp 96.1°F

## 2021-03-24 DIAGNOSIS — D509 Iron deficiency anemia, unspecified: Secondary | ICD-10-CM | POA: Diagnosis not present

## 2021-03-24 DIAGNOSIS — E611 Iron deficiency: Secondary | ICD-10-CM

## 2021-03-24 DIAGNOSIS — E7800 Pure hypercholesterolemia, unspecified: Secondary | ICD-10-CM | POA: Diagnosis not present

## 2021-03-24 MED ORDER — IRON SUCROSE 20 MG/ML IV SOLN
200.0000 mg | Freq: Once | INTRAVENOUS | Status: AC
  Administered 2021-03-24: 200 mg via INTRAVENOUS
  Filled 2021-03-24: qty 10

## 2021-03-24 MED ORDER — SODIUM CHLORIDE 0.9 % IV SOLN
Freq: Once | INTRAVENOUS | Status: AC
  Filled 2021-03-24: qty 250

## 2021-03-24 MED ORDER — SODIUM CHLORIDE 0.9 % IV SOLN: 200.0000 mg | Freq: Once | INTRAVENOUS | Status: DC

## 2021-03-28 DIAGNOSIS — G4733 Obstructive sleep apnea (adult) (pediatric): Secondary | ICD-10-CM | POA: Diagnosis not present

## 2021-03-29 ENCOUNTER — Other Ambulatory Visit: Payer: Self-pay

## 2021-04-04 ENCOUNTER — Other Ambulatory Visit: Payer: Self-pay

## 2021-04-04 ENCOUNTER — Ambulatory Visit (INDEPENDENT_AMBULATORY_CARE_PROVIDER_SITE_OTHER): Payer: 59 | Admitting: Dermatology

## 2021-04-04 DIAGNOSIS — L700 Acne vulgaris: Secondary | ICD-10-CM | POA: Diagnosis not present

## 2021-04-04 MED ORDER — ISOTRETINOIN 20 MG PO CAPS
20.0000 mg | ORAL_CAPSULE | Freq: Two times a day (BID) | ORAL | 0 refills | Status: DC
  Filled 2021-04-04 (×2): qty 60, 30d supply, fill #0

## 2021-04-04 MED ORDER — MUPIROCIN 2 % EX OINT
1.0000 "application " | TOPICAL_OINTMENT | Freq: Every day | CUTANEOUS | 11 refills | Status: DC
  Filled 2021-04-04: qty 22, 15d supply, fill #0

## 2021-04-04 NOTE — Patient Instructions (Signed)

## 2021-04-04 NOTE — Progress Notes (Signed)
° °  Follow-Up Visit   Subjective  Matthew Kelley is a 60 y.o. male who presents for the following: Acne (30 day follow up - Isotretinoin 20 mg 1 po qd).   Isotretinoin F/U - 04/04/21 1400       Isotretinoin Follow Up   iPledge # [id number]    Date 04/04/21    Acne breakouts since last visit? No      Dosage   Target Dosage (mg) 13275    Current (To Date) Dosage (mg) 600    To Go Dosage (mg) 12675      Side Effects   Skin WNL    Gastrointestinal WNL    Neurological WNL    Constitutional WNL                The following portions of the chart were reviewed this encounter and updated as appropriate:       Review of Systems:  No other skin or systemic complaints except as noted in HPI or Assessment and Plan.  Objective  Well appearing patient in no apparent distress; mood and affect are within normal limits.  A focused examination was performed including face. Relevant physical exam findings are noted in the Assessment and Plan.  Face Scattered yellow papules and closed comedons    Assessment & Plan  Acne vulgaris Face  Week #4 isotretinoin Increase Isotretinoin 20 mg 2 po qd   Patient confirmed in iPledge and isotretinoin sent to pharmacy. Ipledge #[phone] [facility] employee pharmacy   ISOtretinoin (ABSORICA) 20 MG capsule - Face Take 1 capsule (20 mg total) by mouth 2 (two) times daily.  mupirocin ointment (BACTROBAN) 2 % - Face Apply 1 application topically daily.   Return in about 30 days (around 05/04/2021) for Isotretinoin.  I, Ashok Cordia, CMA, am acting as scribe for Brendolyn Patty, MD .  Documentation: I have reviewed the above documentation for accuracy and completeness, and I agree with the above.  Brendolyn Patty MD

## 2021-04-05 ENCOUNTER — Other Ambulatory Visit: Payer: Self-pay

## 2021-04-05 MED ORDER — KETOCONAZOLE 2 % EX SHAM
1.0000 "application " | MEDICATED_SHAMPOO | CUTANEOUS | 3 refills | Status: DC
  Filled 2021-04-05: qty 360, 90d supply, fill #0
  Filled 2021-04-05: qty 240, 60d supply, fill #0
  Filled 2021-07-21: qty 240, 60d supply, fill #1
  Filled 2021-09-22: qty 240, 60d supply, fill #2
  Filled 2022-01-12: qty 240, 60d supply, fill #3
  Filled ????-??-??: fill #4

## 2021-04-06 ENCOUNTER — Other Ambulatory Visit: Payer: Self-pay

## 2021-04-07 ENCOUNTER — Other Ambulatory Visit: Payer: Self-pay

## 2021-04-21 ENCOUNTER — Telehealth: Payer: Self-pay | Admitting: Internal Medicine

## 2021-04-21 NOTE — Telephone Encounter (Signed)
Pt called and states that he is finished with his treatments and wants to know if he has appts soon for Jan. Or Feb. Can put on mychart or give him a call at [phone]

## 2021-04-21 NOTE — Telephone Encounter (Signed)
Patient is scheduled on 06/09/2021 for MD/labs/possible venofer as 3 month f/u from 03/10/21 checkout.  Confirmed with patient that he is not requesting to bee seen sooner he did not have this appointment on his calendar.

## 2021-04-21 NOTE — Telephone Encounter (Signed)
Just wanted to make sure you didn't need to see any sooner.

## 2021-04-26 ENCOUNTER — Other Ambulatory Visit: Payer: Self-pay

## 2021-04-28 DIAGNOSIS — H31091 Other chorioretinal scars, right eye: Secondary | ICD-10-CM | POA: Diagnosis not present

## 2021-04-28 DIAGNOSIS — H35372 Puckering of macula, left eye: Secondary | ICD-10-CM | POA: Diagnosis not present

## 2021-04-28 DIAGNOSIS — H353132 Nonexudative age-related macular degeneration, bilateral, intermediate dry stage: Secondary | ICD-10-CM | POA: Diagnosis not present

## 2021-04-28 DIAGNOSIS — H43812 Vitreous degeneration, left eye: Secondary | ICD-10-CM | POA: Diagnosis not present

## 2021-05-12 DIAGNOSIS — D509 Iron deficiency anemia, unspecified: Secondary | ICD-10-CM | POA: Diagnosis not present

## 2021-05-12 DIAGNOSIS — R933 Abnormal findings on diagnostic imaging of other parts of digestive tract: Secondary | ICD-10-CM | POA: Diagnosis not present

## 2021-05-19 ENCOUNTER — Encounter: Payer: Self-pay | Admitting: Internal Medicine

## 2021-05-19 ENCOUNTER — Other Ambulatory Visit: Payer: Self-pay

## 2021-05-30 DIAGNOSIS — K921 Melena: Secondary | ICD-10-CM | POA: Diagnosis not present

## 2021-05-31 ENCOUNTER — Other Ambulatory Visit: Payer: Self-pay | Admitting: *Deleted

## 2021-05-31 DIAGNOSIS — E611 Iron deficiency: Secondary | ICD-10-CM

## 2021-06-01 ENCOUNTER — Encounter: Payer: Self-pay | Admitting: Dermatology

## 2021-06-02 DIAGNOSIS — K580 Irritable bowel syndrome with diarrhea: Secondary | ICD-10-CM | POA: Diagnosis not present

## 2021-06-02 DIAGNOSIS — I2102 ST elevation (STEMI) myocardial infarction involving left anterior descending coronary artery: Secondary | ICD-10-CM | POA: Diagnosis not present

## 2021-06-02 DIAGNOSIS — E782 Mixed hyperlipidemia: Secondary | ICD-10-CM | POA: Diagnosis not present

## 2021-06-02 DIAGNOSIS — I2510 Atherosclerotic heart disease of native coronary artery without angina pectoris: Secondary | ICD-10-CM | POA: Diagnosis not present

## 2021-06-02 DIAGNOSIS — Z85850 Personal history of malignant neoplasm of thyroid: Secondary | ICD-10-CM | POA: Diagnosis not present

## 2021-06-02 DIAGNOSIS — G4733 Obstructive sleep apnea (adult) (pediatric): Secondary | ICD-10-CM | POA: Diagnosis not present

## 2021-06-02 DIAGNOSIS — K259 Gastric ulcer, unspecified as acute or chronic, without hemorrhage or perforation: Secondary | ICD-10-CM | POA: Diagnosis not present

## 2021-06-02 DIAGNOSIS — D500 Iron deficiency anemia secondary to blood loss (chronic): Secondary | ICD-10-CM | POA: Diagnosis not present

## 2021-06-02 DIAGNOSIS — Z8371 Family history of colonic polyps: Secondary | ICD-10-CM | POA: Diagnosis not present

## 2021-06-07 ENCOUNTER — Other Ambulatory Visit
Admission: RE | Admit: 2021-06-07 | Discharge: 2021-06-07 | Disposition: A | Discharge status: Home / Self Care | Payer: 59 | Source: Ambulatory Visit | Attending: Internal Medicine | Admitting: Internal Medicine

## 2021-06-07 DIAGNOSIS — R079 Chest pain, unspecified: Secondary | ICD-10-CM | POA: Insufficient documentation

## 2021-06-07 LAB — TROPONIN I (HIGH SENSITIVITY): Troponin I (High Sensitivity): 2 ng/L (ref <18)

## 2021-06-09 ENCOUNTER — Inpatient Hospital Stay: Payer: 59

## 2021-06-09 ENCOUNTER — Inpatient Hospital Stay (HOSPITAL_BASED_OUTPATIENT_CLINIC_OR_DEPARTMENT_OTHER): Payer: 59 | Admitting: Internal Medicine

## 2021-06-09 ENCOUNTER — Encounter: Payer: Self-pay | Admitting: Internal Medicine

## 2021-06-09 ENCOUNTER — Inpatient Hospital Stay: Payer: 59 | Attending: Internal Medicine

## 2021-06-09 ENCOUNTER — Other Ambulatory Visit: Payer: Self-pay

## 2021-06-09 DIAGNOSIS — D509 Iron deficiency anemia, unspecified: Secondary | ICD-10-CM | POA: Insufficient documentation

## 2021-06-09 DIAGNOSIS — E611 Iron deficiency: Secondary | ICD-10-CM | POA: Diagnosis not present

## 2021-06-09 LAB — CBC WITH DIFFERENTIAL/PLATELET
Abs Immature Granulocytes: 0.01 10*3/uL (ref 0.00–0.07)
Basophils Absolute: 0 10*3/uL (ref 0.0–0.1)
Basophils Relative: 1 %
Eosinophils Absolute: 0.2 10*3/uL (ref 0.0–0.5)
Eosinophils Relative: 4 %
HCT: 26.4 % — ABNORMAL LOW (ref 39.0–52.0)
Hemoglobin: 8.7 g/dL — ABNORMAL LOW (ref 13.0–17.0)
Immature Granulocytes: 0 %
Lymphocytes Relative: 32 %
Lymphs Abs: 1.2 10*3/uL (ref 0.7–4.0)
MCH: 28.9 pg (ref 26.0–34.0)
MCHC: 33 g/dL (ref 30.0–36.0)
MCV: 87.7 fL (ref 80.0–100.0)
Monocytes Absolute: 0.4 10*3/uL (ref 0.1–1.0)
Monocytes Relative: 11 %
Neutro Abs: 2 10*3/uL (ref 1.7–7.7)
Neutrophils Relative %: 52 %
Platelets: 254 10*3/uL (ref 150–400)
RBC: 3.01 MIL/uL — ABNORMAL LOW (ref 4.22–5.81)
RDW: 13.3 % (ref 11.5–15.5)
WBC: 3.9 10*3/uL — ABNORMAL LOW (ref 4.0–10.5)
nRBC: 0 % (ref 0.0–0.2)

## 2021-06-09 LAB — BASIC METABOLIC PANEL
Anion gap: 4 — ABNORMAL LOW (ref 5–15)
BUN: 21 mg/dL (ref 8–23)
CO2: 28 mmol/L (ref 22–32)
Calcium: 8.2 mg/dL — ABNORMAL LOW (ref 8.9–10.3)
Chloride: 105 mmol/L (ref 98–111)
Creatinine, Ser: 1.09 mg/dL (ref 0.61–1.24)
GFR, Estimated: >60 mL/min (ref >60)
Glucose, Bld: 104 mg/dL — ABNORMAL HIGH (ref 70–99)
Potassium: 4.1 mmol/L (ref 3.5–5.1)
Sodium: 137 mmol/L (ref 135–145)

## 2021-06-09 LAB — IRON AND TIBC
Iron: 22 ug/dL — ABNORMAL LOW (ref 45–182)
Saturation Ratios: 6 % — ABNORMAL LOW (ref 17.9–39.5)
TIBC: 395 ug/dL (ref 250–450)
UIBC: 373 ug/dL

## 2021-06-09 LAB — FERRITIN: Ferritin: 17 ng/mL — ABNORMAL LOW (ref 24–336)

## 2021-06-09 MED ORDER — IRON SUCROSE 20 MG/ML IV SOLN
200.0000 mg | Freq: Once | INTRAVENOUS | Status: AC
  Administered 2021-06-09: 200 mg via INTRAVENOUS
  Filled 2021-06-09: qty 10

## 2021-06-09 MED ORDER — SODIUM CHLORIDE 0.9 % IV SOLN
Freq: Once | INTRAVENOUS | Status: AC
  Filled 2021-06-09: qty 250

## 2021-06-09 MED ORDER — SODIUM CHLORIDE 0.9 % IV SOLN: 200.0000 mg | Freq: Once | INTRAVENOUS | Status: DC

## 2021-06-09 NOTE — Progress Notes (Signed)
[facility] CONSULT NOTE  Patient Care Team: Einar Pheasant, MD as PCP - General (Internal Medicine) Einar Pheasant, MD (Internal Medicine)  CHIEF COMPLAINTS/PURPOSE OF CONSULTATION: ANEMIA   HEMATOLOGY HISTORY:  # ANEMIA capsule- AVMs- actively bleeding [sep 5397]; EGD-; colonoscopy-< 2017; < 2007- EGD-stretching of esophagus. prior history of GI bleed/AVMs more than 15 years ago.  # CAD [on asprin 81 mg/day s/p stenting; off brillinta]  HISTORY OF PRESENTING ILLNESS: Ambulating independently.  Alone. Matthew Kelley 61 y.o.  male with symptomatic iron deficiency [with poor response to p.o. iron] is here for follow-up.  Approximately 2 weeks ago patient had GI bleed and hgb dropped to 10.  Patient is scheduled for DB enteroscopy GI procedure with Dr. Malissa Hippo on 06/19/21 at [facility].  Had chest pain 2 days ago and had cardiac work up with hgb of 8.4.    Patient is currently on aspirin.  Off Brilinta  Review of Systems  Constitutional:  Positive for malaise/fatigue. Negative for chills, diaphoresis, fever and weight loss.  HENT:  Negative for nosebleeds and sore throat.   Eyes:  Negative for double vision.  Respiratory:  Negative for cough, hemoptysis, sputum production, shortness of breath and wheezing.   Cardiovascular:  Negative for chest pain, palpitations, orthopnea and leg swelling.  Gastrointestinal:  Negative for abdominal pain, blood in stool, constipation, diarrhea, heartburn, melena, nausea and vomiting.  Genitourinary:  Negative for dysuria, frequency and urgency.  Musculoskeletal:  Negative for back pain and joint pain.  Skin: Negative.  Negative for itching and rash.  Neurological:  Negative for dizziness, tingling, focal weakness, weakness and headaches.  Endo/Heme/Allergies:  Does not bruise/bleed easily.  Psychiatric/Behavioral:  Negative for depression. The patient is not nervous/anxious and does not have insomnia.    MEDICAL HISTORY:  Past Medical  History:  Diagnosis Date   Atypical nevus 09/07/2008   Left scapula, lateral. Moderate atypia.   Blood in stool    h/o   Cancer ([place])    thyroid cancer   Dysplastic nevus 12/20/2009   Spinal lower back. Moderate atypia, edges free   Dysplastic nevus 12/20/2009   Left paraspinal lower back. Mild atypia, edges free.   Dysplastic nevus 11/24/2018   Right lower back. Mild atypia, deep margin involved.   Dysplastic nevus 11/24/2018   Right mid back. Moderate atypia and halo nevus effect. Limited margins free.   H/O total thyroidectomy    History of chicken pox    Hypercholesterolemia    Hypothyroidism    Sleep apnea    Thyroid cancer ([place])    Vertigo     SURGICAL HISTORY: Past Surgical History:  Procedure Laterality Date   CHOLECYSTECTOMY  2000   CORONARY/GRAFT ACUTE MI REVASCULARIZATION N/A 06/16/2020   Procedure: Coronary/Graft Acute MI Revascularization;  Surgeon: [name], MD;  Location: [facility];  Service: Cardiovascular;  Laterality: N/A;   LEFT HEART CATH AND CORONARY ANGIOGRAPHY N/A 06/16/2020   Procedure: LEFT HEART CATH AND CORONARY ANGIOGRAPHY;  Surgeon: [name], MD;  Location: [facility];  Service: Cardiovascular;  Laterality: N/A;   TOTAL THYROIDECTOMY  2009    SOCIAL HISTORY: Social History   Socioeconomic History   Marital status: Married    Spouse name: Not on file   Number of children: Not on file   Years of education: Not on file   Highest education level: Not on file  Occupational History   Not on file  Tobacco Use   Smoking status: Never   Smokeless  tobacco: Never  Vaping Use   Vaping Use: Never used  Substance and Sexual Activity   Alcohol use: Yes    Alcohol/week: 0.0 standard drinks   Drug use: No   Sexual activity: Not on file  Other Topics Concern   Not on file  Social History Narrative   ** Merged History Encounter **       Social Determinants of Health    Financial Resource Strain: Not on file  Food Insecurity: Not on file  Transportation Needs: Not on file  Physical Activity: Not on file  Stress: Not on file  Social Connections: Not on file  Intimate Partner Violence: Not on file    FAMILY HISTORY: Family History  Problem Relation Age of Onset   Hyperlipidemia Father    Heart disease Father     ALLERGIES:  has No Known Allergies.  MEDICATIONS:  Current Outpatient Medications  Medication Sig Dispense Refill   Alirocumab (PRALUENT) 150 MG/ML SOAJ Inject 150 mg subcutaneously every 14 (fourteen) days 2 mL 12   Alirocumab (PRALUENT) 150 MG/ML SOAJ Inject 150 mg subcutaneously every 14 (fourteen) days 6 mL 4   aspirin 81 MG chewable tablet Chew 1 tablet (81 mg total) by mouth daily. 30 tablet 2   atorvastatin (LIPITOR) 10 MG tablet Take 1 tablet (10 mg total) by mouth once daily 90 tablet 4   cholecalciferol (VITAMIN D) 1000 units tablet Take 1 tablet (1,000 Units total) by mouth daily. 90 tablet 1   cycloSPORINE (RESTASIS) 0.05 % ophthalmic emulsion PLACE ONE DROP IN BOTH EYES TWICE A DAY 180 each 3   doxycycline (VIBRAMYCIN) 100 MG capsule Take 1 capsule (100 mg total) by mouth 2 (two) times daily. 60 capsule 6   finasteride (PROPECIA) 1 MG tablet TAKE 1 TABLET BY MOUTH ONCE DAILY 90 tablet 10   ketoconazole (NIZORAL) 2 % shampoo Apply 1 application topically 2 (two) times a week. 360 mL 3   levothyroxine (SYNTHROID) 175 MCG tablet Take 175 mcg by mouth daily before breakfast.     metoprolol tartrate (LOPRESSOR) 25 MG tablet Take 1 tablet (25 mg total) by mouth 2 (two) times daily 180 tablet 3   Multiple Vitamins-Minerals (PRESERVISION AREDS 2) CAPS      mupirocin ointment (BACTROBAN) 2 % Apply 1 application topically daily. 22 g 11   valACYclovir (VALTREX) 1000 MG tablet Take by mouth.     azithromycin (ZITHROMAX Z-PAK) 250 MG tablet Take 2 tablets by mouth on day 1 then 1 tablet daily on days 2-5 6 each 3    ciprofloxacin (CIPRO) 500 MG tablet Take 1 tablet (500 mg total) by mouth 2 (two) times daily. 60 tablet 3   ISOtretinoin (ABSORICA) 20 MG capsule Take 1 capsule (20 mg total) by mouth 2 (two) times daily. 60 capsule 0   metoprolol tartrate (LOPRESSOR) 25 MG tablet Take 1 tablet (25 mg total) by mouth 2 (two) times daily 180 tablet 3   valACYclovir (VALTREX) 1000 MG tablet Take 1 tablet (1,000 mg total) by mouth 2 (two) times daily. 180 tablet 3   No current facility-administered medications for this visit.      PHYSICAL EXAMINATION:   Vitals:   06/09/21 1300  BP: 126/79  Pulse: 88  Temp: 98.5 F (36.9 C)   Filed Weights   06/09/21 1300  Weight: 194 lb 12.8 oz (88.4 kg)    Physical Exam Vitals and nursing note reviewed.  HENT:     Head: Normocephalic and atraumatic.  Mouth/Throat:     Pharynx: Oropharynx is clear.  Eyes:     Extraocular Movements: Extraocular movements intact.     Pupils: Pupils are equal, round, and reactive to light.  Cardiovascular:     Rate and Rhythm: Normal rate and regular rhythm.  Abdominal:     Palpations: Abdomen is soft.  Musculoskeletal:        General: Normal range of motion.     Cervical back: Normal range of motion.  Skin:    General: Skin is warm.  Neurological:     General: No focal deficit present.     Mental Status: He is alert and oriented to person, place, and time.  Psychiatric:        Behavior: Behavior normal.        Judgment: Judgment normal.   LABORATORY DATA:  I have reviewed the data as listed Lab Results  Component Value Date   WBC 3.9 (L) 06/09/2021   HGB 8.7 (L) 06/09/2021   HCT 26.4 (L) 06/09/2021   MCV 87.7 06/09/2021   PLT 254 06/09/2021   Recent Labs    06/18/20 0421 08/12/20 0817 12/30/20 0918 03/10/21 0817 06/09/21 1257  NA 138 142 141 139 137  K 3.9 4.6 4.3 4.3 4.1  CL 108 107 105 105 105  CO2 24 29 30 28 28   GLUCOSE 104* 99 101* 113* 104*  BUN 20 21 17 20 21   CREATININE 1.14 1.13  1.10 1.12 1.09  CALCIUM 8.5* 8.8 8.7 8.5* 8.2*  GFRNONAA >60  --   --  >60 >60  PROT  --  6.4 6.3 6.8  --   ALBUMIN  --  4.2 4.2 4.2  --   AST  --  12 14 19   --   ALT  --  13 12 29   --   ALKPHOS  --  80 68 69  --   BILITOT  --  0.4 0.4 0.4  --   BILIDIR  --  0.1 0.1  --   --      No results found.  Iron deficiency # Anemia- iron deficiency- hemoglobin:11;  Ferritin: 20. S/p IV Venofer x4.  Hemoglobin today improved at 12.5.  Proceed with Venofer today; and 3 more.  #Etiology: Likely secondary to chronic GI bleeding/AVMs-s/p capsule study- Awaiting with [facility] evaluation.    # CAD - [off Brillinta- dec 2022; on asprin 81 mg/day]-STABLE.   # DISPOSITION:  # Venofer todday # venofer weekly x3 more [starting 3/24] # follow up in 3 months- MD; labs- cbc/bmp; iron studies/ferritin-possible venofer- Dr.B        All questions were answered. The patient knows to call the clinic with any problems, questions or concerns.      Cammie Sickle, MD 06/09/2021 2:03 PM

## 2021-06-09 NOTE — Progress Notes (Signed)
2 weeks ago patient had GI bleed and hgb dropped to 10.  Is scheduled for GI procedure with Dr. Malissa Hippo on 06/19/21 at [facility].  Had chest pain 2 days ago and had cardiac work up with hgb of 8.4.   ?

## 2021-06-09 NOTE — Assessment & Plan Note (Addendum)
#   Anemia- iron deficiency- hemoglobin:11;  Ferritin: 20. S/p IV Venofer x4.  Hemoglobin today improved at 12.5.  Proceed with Venofer today; and 3 more. ? ?#Etiology: Likely secondary to chronic GI bleeding/AVMs-s/p capsule study- Awaiting with [facility] evaluation.   ? ?# CAD - [off Brillinta- dec 2022; on asprin 81 mg/day]-STABLE.  ? ?# DISPOSITION:  ?# Venofer todday ?# venofer weekly x3 more [starting 3/24] ?# follow up in 3 months- MD; labs- cbc/bmp; iron studies/ferritin-possible venofer- Dr.B   ?

## 2021-06-16 ENCOUNTER — Inpatient Hospital Stay: Payer: 59

## 2021-06-16 ENCOUNTER — Other Ambulatory Visit: Payer: Self-pay

## 2021-06-16 VITALS — BP 128/83 | HR 61 | Temp 98.2°F | Resp 16

## 2021-06-16 DIAGNOSIS — D509 Iron deficiency anemia, unspecified: Secondary | ICD-10-CM | POA: Diagnosis not present

## 2021-06-16 DIAGNOSIS — E611 Iron deficiency: Secondary | ICD-10-CM

## 2021-06-16 MED ORDER — SODIUM CHLORIDE 0.9 % IV SOLN: 200.0000 mg | Freq: Once | INTRAVENOUS | Status: DC

## 2021-06-16 MED ORDER — IRON SUCROSE 20 MG/ML IV SOLN
200.0000 mg | Freq: Once | INTRAVENOUS | Status: AC
  Administered 2021-06-16: 200 mg via INTRAVENOUS

## 2021-06-16 MED ORDER — SODIUM CHLORIDE 0.9 % IV SOLN
Freq: Once | INTRAVENOUS | Status: AC
  Filled 2021-06-16: qty 250

## 2021-06-19 DIAGNOSIS — G4733 Obstructive sleep apnea (adult) (pediatric): Secondary | ICD-10-CM | POA: Diagnosis not present

## 2021-06-19 DIAGNOSIS — D649 Anemia, unspecified: Secondary | ICD-10-CM | POA: Diagnosis not present

## 2021-06-19 DIAGNOSIS — D500 Iron deficiency anemia secondary to blood loss (chronic): Secondary | ICD-10-CM | POA: Diagnosis not present

## 2021-06-19 DIAGNOSIS — Z8616 Personal history of COVID-19: Secondary | ICD-10-CM | POA: Diagnosis not present

## 2021-06-19 DIAGNOSIS — R933 Abnormal findings on diagnostic imaging of other parts of digestive tract: Secondary | ICD-10-CM | POA: Diagnosis not present

## 2021-06-19 DIAGNOSIS — K317 Polyp of stomach and duodenum: Secondary | ICD-10-CM | POA: Diagnosis not present

## 2021-06-19 DIAGNOSIS — K580 Irritable bowel syndrome with diarrhea: Secondary | ICD-10-CM | POA: Diagnosis not present

## 2021-06-19 DIAGNOSIS — Z7982 Long term (current) use of aspirin: Secondary | ICD-10-CM | POA: Diagnosis not present

## 2021-06-19 DIAGNOSIS — Z8719 Personal history of other diseases of the digestive system: Secondary | ICD-10-CM | POA: Diagnosis not present

## 2021-06-19 DIAGNOSIS — I10 Essential (primary) hypertension: Secondary | ICD-10-CM | POA: Diagnosis not present

## 2021-06-26 ENCOUNTER — Ambulatory Visit (INDEPENDENT_AMBULATORY_CARE_PROVIDER_SITE_OTHER): Payer: 59 | Admitting: Dermatology

## 2021-06-26 ENCOUNTER — Encounter: Payer: Self-pay | Admitting: Dermatology

## 2021-06-26 ENCOUNTER — Other Ambulatory Visit: Payer: Self-pay | Admitting: Dermatology

## 2021-06-26 ENCOUNTER — Other Ambulatory Visit: Payer: Self-pay

## 2021-06-26 DIAGNOSIS — L720 Epidermal cyst: Secondary | ICD-10-CM | POA: Diagnosis not present

## 2021-06-26 DIAGNOSIS — D485 Neoplasm of uncertain behavior of skin: Secondary | ICD-10-CM

## 2021-06-26 NOTE — Progress Notes (Signed)
? ?  Follow-Up Visit ?  ?Subjective  ?Matthew Kelley is a 61 y.o. male who presents for the following: Cyst (Here for excision of probable cyst on left neck).  Has been growing and is bothersome. ? ? ? ?The following portions of the chart were reviewed this encounter and updated as appropriate:  ?  ?  ? ?Review of Systems:  No other skin or systemic complaints except as noted in HPI or Assessment and Plan. ? ?Objective  ?Well appearing patient in no apparent distress; mood and affect are within normal limits. ? ?A focused examination was performed including neck. Relevant physical exam findings are noted in the Assessment and Plan. ? ?Left Lateral Neck ?Subcutaneous cystic nodule 0.9 cm ? ? ? ?Assessment & Plan  ?Neoplasm of uncertain behavior of skin ?Left Lateral Neck ? ?Skin excision ? ?Lesion length (cm):  0.9 ?Margin per side (cm):  0.1 ?Total excision diameter (cm):  1.1 ?Informed consent: discussed and consent obtained   ?Timeout: patient name, date of birth, surgical site, and procedure verified   ?Procedure prep:  Patient was prepped and draped in usual sterile fashion ?Prep type:  Povidone-iodine ?Anesthesia: the lesion was anesthetized in a standard fashion   ?Anesthetic:  1% lidocaine w/ epinephrine 1-100,000 local infiltration (3 cc lido w/epi, 3 cc 0.25% bupivicaine) ?Instrument used comment:  15 c ?Hemostasis achieved with: pressure and electrodesiccation   ?Outcome: patient tolerated procedure well with no complications   ? ?Skin repair ?Complexity:  Intermediate ?Final length (cm):  1.3 ?Informed consent: discussed and consent obtained   ?Timeout: patient name, date of birth, surgical site, and procedure verified   ?Reason for type of repair: reduce tension to allow closure, reduce the risk of dehiscence, infection, and necrosis, reduce subcutaneous dead space and avoid a hematoma, preserve normal anatomical and functional relationships and enhance both functionality and cosmetic results    ?Undermining: edges undermined   ?Subcutaneous layers (deep stitches):  ?Suture size:  4-0 ?Suture type: Vicryl (polyglactin 910)   ?Stitches:  Buried vertical mattress ?Fine/surface layer approximation (top stitches):  ?Suture size:  5-0 ?Suture type: nylon   ?Stitches: simple interrupted   ?Suture removal (days):  7 ?Hemostasis achieved with: pressure and electrodesiccation ?Outcome: patient tolerated procedure well with no complications   ?Post-procedure details: sterile dressing applied and wound care instructions given   ?Dressing type: pressure dressing (Mupirocin ointment)   ?Additional details:  Mupirocin and a pressure dressing applied ? ?Specimen 1 - Surgical pathology ?Differential Diagnosis: cyst vs other ? ?Check Margins: No ?0.9 cm subcutaneous cystic nodule ? ? ?Return in about 1 week (around 07/03/2021) for Suture Removal. ? ?Graciella Belton, RMA, am acting as scribe for Brendolyn Patty, MD . ? ?Documentation: I have reviewed the above documentation for accuracy and completeness, and I agree with the above. ? ?Brendolyn Patty MD  ? ?

## 2021-06-26 NOTE — Patient Instructions (Signed)
Wound Care Instructions ? ?Cleanse wound gently with soap and water once a day then pat dry with clean gauze. Apply a thing coat of Petrolatum (petroleum jelly, "Vaseline") over the wound (unless you have an allergy to this). We recommend that you use a new, sterile tube of Vaseline. Do not pick or remove scabs. Do not remove the yellow or white "healing tissue" from the base of the wound. ? ?Cover the wound with fresh, clean, nonstick gauze and secure with paper tape. You may use Band-Aids in place of gauze and tape if the would is small enough, but would recommend trimming much of the tape off as there is often too much. Sometimes Band-Aids can irritate the skin. ? ?You should call the office for your biopsy report after 1 week if you have not already been contacted. ? ?If you experience any problems, such as abnormal amounts of bleeding, swelling, significant bruising, significant pain, or evidence of infection, please call the office immediately. ? ?FOR ADULT SURGERY PATIENTS: If you need something for pain relief you may take 1 extra strength Tylenol (acetaminophen) AND 2 Ibuprofen ('200mg'$  each) together every 4 hours as needed for pain. (do not take these if you are allergic to them or if you have a reason you should not take them.) Typically, you may only need pain medication for 1 to 3 days.  ? ?Melanoma ABCDEs ? ?Melanoma is the most dangerous type of skin cancer, and is the leading cause of death from skin disease.  You are more likely to develop melanoma if you: ?Have light-colored skin, light-colored eyes, or red or blond hair ?Spend a lot of time in the sun ?Tan regularly, either outdoors or in a tanning bed ?Have had blistering sunburns, especially during childhood ?Have a close family member who has had a melanoma ?Have atypical moles or large birthmarks ? ?Early detection of melanoma is key since treatment is typically straightforward and cure rates are extremely high if we catch it early.  ? ?The  first sign of melanoma is often a change in a mole or a new dark spot.  The ABCDE system is a way of remembering the signs of melanoma. ? ?A for asymmetry:  The two halves do not match. ?B for border:  The edges of the growth are irregular. ?C for color:  A mixture of colors are present instead of an even brown color. ?D for diameter:  Melanomas are usually (but not always) greater than 67m - the size of a pencil eraser. ?E for evolution:  The spot keeps changing in size, shape, and color. ? ?Please check your skin once per month between visits. You can use a small mirror in front and a large mirror behind you to keep an eye on the back side or your body.  ? ?If you see any new or changing lesions before your next follow-up, please call to schedule a visit. ? ?Please continue daily skin protection including broad spectrum sunscreen SPF 30+ to sun-exposed areas, reapplying every 2 hours as needed when you're outdoors.   ?

## 2021-06-27 ENCOUNTER — Telehealth: Payer: Self-pay

## 2021-06-27 NOTE — Telephone Encounter (Signed)
Patient doing well following yesterday's surgery. Excision site cleaned with Puracyn, mupirocin applied and covered with band aid. ?Lurlean Horns., RMA ?

## 2021-06-29 ENCOUNTER — Encounter: Payer: Self-pay | Admitting: Internal Medicine

## 2021-06-30 ENCOUNTER — Inpatient Hospital Stay: Payer: 59

## 2021-06-30 ENCOUNTER — Ambulatory Visit: Payer: 59 | Admitting: Internal Medicine

## 2021-06-30 ENCOUNTER — Other Ambulatory Visit: Payer: Self-pay

## 2021-06-30 ENCOUNTER — Telehealth: Payer: Self-pay | Admitting: Internal Medicine

## 2021-06-30 VITALS — BP 131/71 | HR 81 | Temp 98.8°F | Resp 16

## 2021-06-30 DIAGNOSIS — I25119 Atherosclerotic heart disease of native coronary artery with unspecified angina pectoris: Secondary | ICD-10-CM

## 2021-06-30 DIAGNOSIS — E7800 Pure hypercholesterolemia, unspecified: Secondary | ICD-10-CM | POA: Diagnosis not present

## 2021-06-30 DIAGNOSIS — G4733 Obstructive sleep apnea (adult) (pediatric): Secondary | ICD-10-CM

## 2021-06-30 DIAGNOSIS — Z85850 Personal history of malignant neoplasm of thyroid: Secondary | ICD-10-CM

## 2021-06-30 DIAGNOSIS — D509 Iron deficiency anemia, unspecified: Secondary | ICD-10-CM

## 2021-06-30 DIAGNOSIS — E611 Iron deficiency: Secondary | ICD-10-CM

## 2021-06-30 MED ORDER — IRON SUCROSE 20 MG/ML IV SOLN
200.0000 mg | Freq: Once | INTRAVENOUS | Status: AC
  Administered 2021-06-30: 200 mg via INTRAVENOUS
  Filled 2021-06-30: qty 10

## 2021-06-30 MED ORDER — SODIUM CHLORIDE 0.9 % IV SOLN
Freq: Once | INTRAVENOUS | Status: AC
  Filled 2021-06-30: qty 250

## 2021-06-30 MED ORDER — SODIUM CHLORIDE 0.9 % IV SOLN: 200.0000 mg | Freq: Once | INTRAVENOUS | Status: DC

## 2021-06-30 NOTE — Progress Notes (Signed)
Patient ID: Matthew Kelley, male   DOB: 31-Jan-1961, 61 y.o.   MRN: [id number] ? ? ?Subjective:  ? ? Patient ID: Matthew Kelley, male    DOB: 1960-12-08, 61 y.o.   MRN: [id number] ? ?This visit occurred during the SARS-CoV-2 public health emergency.  Safety protocols were in place, including screening questions prior to the visit, additional usage of staff PPE, and extensive cleaning of exam room while observing appropriate contact time as indicated for disinfecting solutions.  ? ?Patient here for a scheduled follow up.  ? ?Chief Complaint  ?Patient presents with  ? Follow-up  ? .  ? ?HPI ?Recent GI bleed/melena.  Hgb 8.4.  seeing Dr Rogue Bussing.  Receiving IV iron infustions.  (Venofer 06/09/21).  Planning for iron infusion this pm. Seeing Dr Malissa Hippo.  S/p enteroscopy 06/19/21 - few fundic gland polyps, normal duodenum, jejunam and ileum - normal.  Planning for capsule study 07/19/21.  Feeling better. No further melena.  Three weeks ago - chest pain.  Had EKG and stress test - ok.  No further chest pain.  Is exercising.  No sob.  No acid reflux or abdominal pain reported.   ? ? ?Past Medical History:  ?Diagnosis Date  ? Atypical nevus 09/07/2008  ? Left scapula, lateral. Moderate atypia.  ? Blood in stool   ? h/o  ? Cancer [facility])   ? thyroid cancer  ? Dysplastic nevus 12/20/2009  ? Spinal lower back. Moderate atypia, edges free  ? Dysplastic nevus 12/20/2009  ? Left paraspinal lower back. Mild atypia, edges free.  ? Dysplastic nevus 11/24/2018  ? Right lower back. Mild atypia, deep margin involved.  ? Dysplastic nevus 11/24/2018  ? Right mid back. Moderate atypia and halo nevus effect. Limited margins free.  ? H/O total thyroidectomy   ? History of chicken pox   ? Hypercholesterolemia   ? Hypothyroidism   ? Sleep apnea   ? Thyroid cancer ([place])   ? Vertigo   ? ?Past Surgical History:  ?Procedure Laterality Date  ? CHOLECYSTECTOMY  2000  ? CORONARY/GRAFT ACUTE MI REVASCULARIZATION N/A 06/16/2020  ? Procedure: Coronary/Graft Acute MI  Revascularization;  Surgeon: [name], MD;  Location: [facility];  Service: Cardiovascular;  Laterality: N/A;  ? LEFT HEART CATH AND CORONARY ANGIOGRAPHY N/A 06/16/2020  ? Procedure: LEFT HEART CATH AND CORONARY ANGIOGRAPHY;  Surgeon: [name], MD;  Location: [facility];  Service: Cardiovascular;  Laterality: N/A;  ? TOTAL THYROIDECTOMY  2009  ? ?Family History  ?Problem Relation Age of Onset  ? Hyperlipidemia Father   ? Heart disease Father   ? ?Social History  ? ?Socioeconomic History  ? Marital status: Married  ?  Spouse name: Not on file  ? Number of children: Not on file  ? Years of education: Not on file  ? Highest education level: Not on file  ?Occupational History  ? Not on file  ?Tobacco Use  ? Smoking status: Never  ? Smokeless tobacco: Never  ?Vaping Use  ? Vaping Use: Never used  ?Substance and Sexual Activity  ? Alcohol use: Yes  ?  Alcohol/week: 0.0 standard drinks  ? Drug use: No  ? Sexual activity: Not on file  ?Other Topics Concern  ? Not on file  ?Social History Narrative  ? ** Merged History Encounter **  ?    ? ?Social Determinants of Health  ? ?Financial Resource Strain: Not on file  ?Food Insecurity: Not on file  ?Transportation Needs: Not on file  ?  Physical Activity: Not on file  ?Stress: Not on file  ?Social Connections: Not on file  ? ? ? ?Review of Systems  ?Constitutional:  Negative for appetite change and unexpected weight change.  ?HENT:  Negative for congestion and sinus pressure.   ?Respiratory:  Negative for cough, chest tightness and shortness of breath.   ?Cardiovascular:  Negative for chest pain, palpitations and leg swelling.  ?Gastrointestinal:  Negative for abdominal pain, diarrhea, nausea and vomiting.  ?Genitourinary:  Negative for difficulty urinating and dysuria.  ?Musculoskeletal:  Negative for joint swelling and myalgias.  ?Skin:  Negative for color change and rash.  ?Neurological:  Negative for dizziness, light-headedness and  headaches.  ?Psychiatric/Behavioral:  Negative for agitation and dysphoric mood.   ? ?   ?Objective:  ?  ? ?BP 134/72   Pulse 89   Temp 97.9 ?F (36.6 ?C)   Resp 16   Ht '6\' 1"'$  (1.854 m)   Wt 200 lb (90.7 kg)   SpO2 98%   BMI 26.39 kg/m?  ?Wt Readings from Last 3 Encounters:  ?06/29/21 200 lb (90.7 kg)  ?06/09/21 194 lb 12.8 oz (88.4 kg)  ?03/10/21 195 lb (88.5 kg)  ? ? ?Physical Exam ?Constitutional:   ?   General: He is not in acute distress. ?   Appearance: Normal appearance. He is well-developed.  ?HENT:  ?   Head: Normocephalic and atraumatic.  ?   Right Ear: External ear normal.  ?   Left Ear: External ear normal.  ?Eyes:  ?   General: No scleral icterus.    ?   Right eye: No discharge.     ?   Left eye: No discharge.  ?Cardiovascular:  ?   Rate and Rhythm: Normal rate and regular rhythm.  ?Pulmonary:  ?   Effort: Pulmonary effort is normal. No respiratory distress.  ?   Breath sounds: Normal breath sounds.  ?Abdominal:  ?   General: Bowel sounds are normal.  ?   Palpations: Abdomen is soft.  ?   Tenderness: There is no abdominal tenderness.  ?Musculoskeletal:     ?   General: No swelling or tenderness.  ?   Cervical back: Neck supple. No tenderness.  ?Lymphadenopathy:  ?   Cervical: No cervical adenopathy.  ?Skin: ?   Findings: No erythema or rash.  ?Neurological:  ?   Mental Status: He is alert.  ?Psychiatric:     ?   Mood and Affect: Mood normal.     ?   Behavior: Behavior normal.  ? ? ? ?Outpatient Encounter Medications as of 06/30/2021  ?Medication Sig  ? Alirocumab (PRALUENT) 150 MG/ML SOAJ Inject 150 mg subcutaneously every 14 (fourteen) days  ? aspirin 81 MG chewable tablet Chew 1 tablet (81 mg total) by mouth daily.  ? atorvastatin (LIPITOR) 10 MG tablet Take 1 tablet (10 mg total) by mouth once daily  ? cholecalciferol (VITAMIN D) 1000 units tablet Take 1 tablet (1,000 Units total) by mouth daily.  ? cycloSPORINE (RESTASIS) 0.05 % ophthalmic emulsion PLACE ONE DROP IN BOTH EYES TWICE A DAY  ?  finasteride (PROPECIA) 1 MG tablet TAKE 1 TABLET BY MOUTH ONCE DAILY  ? ISOtretinoin (ABSORICA) 20 MG capsule Take 1 capsule (20 mg total) by mouth 2 (two) times daily.  ? ketoconazole (NIZORAL) 2 % shampoo Apply 1 application topically 2 (two) times a week.  ? levothyroxine (SYNTHROID) 175 MCG tablet Take 175 mcg by mouth daily before breakfast.  ? metoprolol tartrate (LOPRESSOR) 25  MG tablet Take 1 tablet (25 mg total) by mouth 2 (two) times daily  ? Multiple Vitamins-Minerals (PRESERVISION AREDS 2) CAPS   ? valACYclovir (VALTREX) 1000 MG tablet Take 1 tablet (1,000 mg total) by mouth 2 (two) times daily.  ? [DISCONTINUED] Alirocumab (PRALUENT) 150 MG/ML SOAJ Inject 150 mg subcutaneously every 14 (fourteen) days  ? [DISCONTINUED] azithromycin (ZITHROMAX Z-PAK) 250 MG tablet Take 2 tablets by mouth on day 1 then 1 tablet daily on days 2-5  ? [DISCONTINUED] ciprofloxacin (CIPRO) 500 MG tablet Take 1 tablet (500 mg total) by mouth 2 (two) times daily.  ? [DISCONTINUED] doxycycline (VIBRAMYCIN) 100 MG capsule Take 1 capsule (100 mg total) by mouth 2 (two) times daily.  ? [DISCONTINUED] metoprolol tartrate (LOPRESSOR) 25 MG tablet Take 1 tablet (25 mg total) by mouth 2 (two) times daily  ? [DISCONTINUED] mupirocin ointment (BACTROBAN) 2 % Apply 1 application topically daily.  ? [DISCONTINUED] valACYclovir (VALTREX) 1000 MG tablet Take by mouth.  ? ?No facility-administered encounter medications on file as of 06/30/2021.  ?  ? ?Lab Results  ?Component Value Date  ? WBC 3.9 (L) 06/09/2021  ? HGB 8.7 (L) 06/09/2021  ? HCT 26.4 (L) 06/09/2021  ? PLT 254 06/09/2021  ? GLUCOSE 104 (H) 06/09/2021  ? CHOL 170 03/10/2021  ? TRIG 56 03/10/2021  ? HDL 49 03/10/2021  ? LDLCALC 110 (H) 03/10/2021  ? ALT 29 03/10/2021  ? AST 19 03/10/2021  ? NA 137 06/09/2021  ? K 4.1 06/09/2021  ? CL 105 06/09/2021  ? CREATININE 1.09 06/09/2021  ? BUN 21 06/09/2021  ? CO2 28 06/09/2021  ? TSH 0.09 (L) 12/30/2020  ? PSA 0.16 12/30/2020  ? INR 1.0  06/16/2020  ? HGBA1C 5.7 09/04/2019  ? ? ?   ?Assessment & Plan:  ? ?Problem List Items Addressed This Visit   ? ? Anemia  ?  Receiving IV iron infusions.  Scheduled this pm.  Seeing GI.  Scheduled to have capsule st

## 2021-06-30 NOTE — Telephone Encounter (Signed)
Pt is requesting lab orders... No new lab orders has been placed... Pt requesting callback...  ?

## 2021-07-01 ENCOUNTER — Encounter: Payer: Self-pay | Admitting: Internal Medicine

## 2021-07-01 NOTE — Assessment & Plan Note (Signed)
On praluent now.  Low cholesterol diet and exercise.  Follow lipid panel.  ?

## 2021-07-01 NOTE — Assessment & Plan Note (Signed)
CPAP.  

## 2021-07-01 NOTE — Assessment & Plan Note (Signed)
Followed by Dr South. On synthroid.  Follow tsh.  

## 2021-07-01 NOTE — Assessment & Plan Note (Addendum)
History of anterior STEMI s/p PCI/DES of proximal LAD.  Continue metoprolol, aspirin and praluent.  Recent chest pain as outlined.  Recent stress test ok.  No chest pain now.  Follow.  ?

## 2021-07-01 NOTE — Assessment & Plan Note (Signed)
Receiving IV iron infusions.  Scheduled this pm.  Seeing GI.  Scheduled to have capsule study 07/19/21.  Follow cbc.  ?

## 2021-07-03 NOTE — Telephone Encounter (Signed)
Labs are placed.

## 2021-07-07 ENCOUNTER — Inpatient Hospital Stay: Payer: 59

## 2021-07-07 VITALS — BP 130/79 | HR 72 | Temp 98.2°F

## 2021-07-07 DIAGNOSIS — D509 Iron deficiency anemia, unspecified: Secondary | ICD-10-CM | POA: Diagnosis not present

## 2021-07-07 DIAGNOSIS — E611 Iron deficiency: Secondary | ICD-10-CM

## 2021-07-07 MED ORDER — SODIUM CHLORIDE 0.9 % IV SOLN
Freq: Once | INTRAVENOUS | Status: AC
  Filled 2021-07-07: qty 250

## 2021-07-07 MED ORDER — IRON SUCROSE 20 MG/ML IV SOLN
200.0000 mg | Freq: Once | INTRAVENOUS | Status: AC
  Administered 2021-07-07: 200 mg via INTRAVENOUS
  Filled 2021-07-07: qty 10

## 2021-07-07 MED ORDER — SODIUM CHLORIDE 0.9 % IV SOLN: 200.0000 mg | Freq: Once | INTRAVENOUS | Status: DC

## 2021-07-11 DIAGNOSIS — G4733 Obstructive sleep apnea (adult) (pediatric): Secondary | ICD-10-CM | POA: Diagnosis not present

## 2021-07-19 DIAGNOSIS — D509 Iron deficiency anemia, unspecified: Secondary | ICD-10-CM | POA: Diagnosis not present

## 2021-07-21 ENCOUNTER — Other Ambulatory Visit: Payer: Self-pay

## 2021-07-24 ENCOUNTER — Other Ambulatory Visit: Payer: Self-pay

## 2021-07-25 ENCOUNTER — Other Ambulatory Visit: Payer: Self-pay

## 2021-07-25 MED ORDER — MINOXIDIL 2.5 MG PO TABS
5.0000 mg | ORAL_TABLET | Freq: Every day | ORAL | 3 refills | Status: DC
  Filled 2021-07-25: qty 180, 90d supply, fill #0
  Filled 2021-09-21 – 2021-11-10 (×2): qty 180, 90d supply, fill #1
  Filled 2022-02-16: qty 180, 90d supply, fill #2
  Filled 2022-06-11: qty 60, 30d supply, fill #3
  Filled 2022-07-20: qty 60, 30d supply, fill #4

## 2021-07-26 ENCOUNTER — Encounter: Payer: Self-pay | Admitting: Internal Medicine

## 2021-07-26 ENCOUNTER — Other Ambulatory Visit: Payer: Self-pay

## 2021-07-27 ENCOUNTER — Other Ambulatory Visit: Payer: Self-pay

## 2021-07-27 ENCOUNTER — Inpatient Hospital Stay: Payer: 59

## 2021-07-28 ENCOUNTER — Inpatient Hospital Stay: Payer: 59 | Attending: Internal Medicine

## 2021-07-28 VITALS — BP 124/68 | HR 78 | Temp 97.7°F

## 2021-07-28 DIAGNOSIS — E611 Iron deficiency: Secondary | ICD-10-CM

## 2021-07-28 DIAGNOSIS — D509 Iron deficiency anemia, unspecified: Secondary | ICD-10-CM | POA: Insufficient documentation

## 2021-07-28 MED ORDER — SODIUM CHLORIDE 0.9 % IV SOLN
Freq: Once | INTRAVENOUS | Status: AC
  Filled 2021-07-28: qty 250

## 2021-07-28 MED ORDER — IRON SUCROSE 20 MG/ML IV SOLN
200.0000 mg | Freq: Once | INTRAVENOUS | Status: AC
  Administered 2021-07-28: 200 mg via INTRAVENOUS
  Filled 2021-07-28 (×2): qty 5

## 2021-07-28 MED ORDER — SODIUM CHLORIDE 0.9 % IV SOLN: 200.0000 mg | Freq: Once | INTRAVENOUS | Status: DC

## 2021-08-03 ENCOUNTER — Inpatient Hospital Stay: Payer: 59

## 2021-08-04 ENCOUNTER — Inpatient Hospital Stay: Payer: 59

## 2021-08-04 VITALS — BP 144/74 | HR 85 | Temp 97.6°F

## 2021-08-04 DIAGNOSIS — E611 Iron deficiency: Secondary | ICD-10-CM

## 2021-08-04 DIAGNOSIS — D509 Iron deficiency anemia, unspecified: Secondary | ICD-10-CM | POA: Diagnosis not present

## 2021-08-04 MED ORDER — SODIUM CHLORIDE 0.9 % IV SOLN
Freq: Once | INTRAVENOUS | Status: AC
  Filled 2021-08-04: qty 250

## 2021-08-04 MED ORDER — IRON SUCROSE 20 MG/ML IV SOLN
200.0000 mg | Freq: Once | INTRAVENOUS | Status: AC
  Administered 2021-08-04: 200 mg via INTRAVENOUS
  Filled 2021-08-04: qty 10

## 2021-08-04 MED ORDER — SODIUM CHLORIDE 0.9 % IV SOLN: 200.0000 mg | Freq: Once | INTRAVENOUS | Status: DC

## 2021-08-04 NOTE — Patient Instructions (Signed)

## 2021-08-10 ENCOUNTER — Inpatient Hospital Stay: Payer: 59

## 2021-08-11 ENCOUNTER — Inpatient Hospital Stay: Payer: 59

## 2021-08-11 ENCOUNTER — Inpatient Hospital Stay: Payer: 59 | Attending: Internal Medicine

## 2021-08-11 VITALS — BP 107/63 | HR 72 | Temp 97.8°F | Resp 18

## 2021-08-11 DIAGNOSIS — K922 Gastrointestinal hemorrhage, unspecified: Secondary | ICD-10-CM | POA: Diagnosis not present

## 2021-08-11 DIAGNOSIS — D509 Iron deficiency anemia, unspecified: Secondary | ICD-10-CM | POA: Insufficient documentation

## 2021-08-11 DIAGNOSIS — E611 Iron deficiency: Secondary | ICD-10-CM

## 2021-08-11 MED ORDER — SODIUM CHLORIDE 0.9 % IV SOLN
Freq: Once | INTRAVENOUS | Status: AC
  Filled 2021-08-11: qty 250

## 2021-08-11 MED ORDER — SODIUM CHLORIDE 0.9 % IV SOLN: 200.0000 mg | Freq: Once | INTRAVENOUS | Status: DC

## 2021-08-11 MED ORDER — IRON SUCROSE 20 MG/ML IV SOLN
200.0000 mg | Freq: Once | INTRAVENOUS | Status: AC
  Administered 2021-08-11: 200 mg via INTRAVENOUS
  Filled 2021-08-11: qty 10

## 2021-08-11 NOTE — Patient Instructions (Signed)
[facility]  Discharge Instructions: ?Thank you for choosing [facility] to provide your oncology and hematology care.  ?If you have a lab appointment with the Cancer Center, please go directly to the Cancer Center and check in at the registration area. ? ?Wear comfortable clothing and clothing appropriate for easy access to any Portacath or PICC line.  ? ?We strive to give you quality time with your provider. You may need to reschedule your appointment if you arrive late (15 or more minutes).  Arriving late affects you and other patients whose appointments are after yours.  Also, if you miss three or more appointments without notifying the office, you may be dismissed from the clinic at the provider?s discretion.    ?  ?For prescription refill requests, have your pharmacy contact our office and allow 72 hours for refills to be completed.   ? ?Today you received the following chemotherapy and/or immunotherapy agents VENOFER ?    ?  ?To help prevent nausea and vomiting after your treatment, we encourage you to take your nausea medication as directed. ? ?BELOW ARE SYMPTOMS THAT SHOULD BE REPORTED IMMEDIATELY: ?*FEVER GREATER THAN 100.4 F (38 ?C) OR HIGHER ?*CHILLS OR SWEATING ?*NAUSEA AND VOMITING THAT IS NOT CONTROLLED WITH YOUR NAUSEA MEDICATION ?*UNUSUAL SHORTNESS OF BREATH ?*UNUSUAL BRUISING OR BLEEDING ?*URINARY PROBLEMS (pain or burning when urinating, or frequent urination) ?*BOWEL PROBLEMS (unusual diarrhea, constipation, pain near the anus) ?TENDERNESS IN MOUTH AND THROAT WITH OR WITHOUT PRESENCE OF ULCERS (sore throat, sores in mouth, or a toothache) ?UNUSUAL RASH, SWELLING OR PAIN  ?UNUSUAL VAGINAL DISCHARGE OR ITCHING  ? ?Items with * indicate a potential emergency and should be followed up as soon as possible or go to the Emergency Department if any problems should occur. ? ?Please show the CHEMOTHERAPY ALERT CARD or IMMUNOTHERAPY ALERT CARD at check-in to  the Emergency Department and triage nurse. ? ?Should you have questions after your visit or need to cancel or reschedule your appointment, please contact [facility]  [phone] and follow the prompts.  Office hours are 8:00 a.m. to 4:30 p.m. Monday - Friday. Please note that voicemails left after 4:00 p.m. may not be returned until the following business day.  We are closed weekends and major holidays. You have access to a nurse at all times for urgent questions. Please call the main number to the clinic [phone] and follow the prompts. ? ?For any non-urgent questions, you may also contact your provider using MyChart. We now offer e-Visits for anyone 18 and older to request care online for non-urgent symptoms. For details visit [email or web address]. ?  ?Also download the MyChart app! Go to the app store, search "MyChart", open the app, select [facility], and log in with your MyChart username and password. ? ?Due to Covid, a mask is required upon entering the hospital/clinic. If you do not have a mask, one will be given to you upon arrival. For doctor visits, patients may have 1 support person aged 18 or older with them. For treatment visits, patients cannot have anyone with them due to current Covid guidelines and our immunocompromised population.  ? ?Iron Sucrose Injection ?What is this medication? ?IRON SUCROSE (EYE ern SOO krose) treats low levels of iron (iron deficiency anemia) in people with kidney disease. Iron is a mineral that plays an important role in making red blood cells, which carry oxygen from your lungs to the rest of your body. ?This medicine   may be used for other purposes; ask your health care provider or pharmacist if you have questions. ?COMMON BRAND NAME(S): Venofer ?What should I tell my care team before I take this medication? ?They need to know if you have any of these conditions: ?Anemia not caused by low iron levels ?Heart disease ?High levels of  iron in the blood ?Kidney disease ?Liver disease ?An unusual or allergic reaction to iron, other medications, foods, dyes, or preservatives ?Pregnant or trying to get pregnant ?Breast-feeding ?How should I use this medication? ?This medication is for infusion into a vein. It is given in a hospital or clinic setting. ?Talk to your care team about the use of this medication in children. While this medication may be prescribed for children as young as 2 years for selected conditions, precautions do apply. ?Overdosage: If you think you have taken too much of this medicine contact a poison control center or emergency room at once. ?NOTE: This medicine is only for you. Do not share this medicine with others. ?What if I miss a dose? ?It is important not to miss your dose. Call your care team if you are unable to keep an appointment. ?What may interact with this medication? ?Do not take this medication with any of the following: ?Deferoxamine ?Dimercaprol ?Other iron products ?This medication may also interact with the following: ?Chloramphenicol ?Deferasirox ?This list may not describe all possible interactions. Give your health care provider a list of all the medicines, herbs, non-prescription drugs, or dietary supplements you use. Also tell them if you smoke, drink alcohol, or use illegal drugs. Some items may interact with your medicine. ?What should I watch for while using this medication? ?Visit your care team regularly. Tell your care team if your symptoms do not start to get better or if they get worse. You may need blood work done while you are taking this medication. ?You may need to follow a special diet. Talk to your care team. Foods that contain iron include: whole grains/cereals, dried fruits, beans, or peas, leafy green vegetables, and organ meats (liver, kidney). ?What side effects may I notice from receiving this medication? ?Side effects that you should report to your care team as soon as  possible: ?Allergic reactions--skin rash, itching, hives, swelling of the face, lips, tongue, or throat ?Low blood pressure--dizziness, feeling faint or lightheaded, blurry vision ?Shortness of breath ?Side effects that usually do not require medical attention (report to your care team if they continue or are bothersome): ?Flushing ?Headache ?Joint pain ?Muscle pain ?Nausea ?Pain, redness, or irritation at injection site ?This list may not describe all possible side effects. Call your doctor for medical advice about side effects. You may report side effects to FDA at 1-800-FDA-1088. ?Where should I keep my medication? ?This medication is given in a hospital or clinic and will not be stored at home. ?NOTE: This sheet is a summary. It may not cover all possible information. If you have questions about this medicine, talk to your doctor, pharmacist, or health care provider. ?? 2023 Elsevier/Gold Standard (2020-08-19 00:00:00) ? ?

## 2021-08-15 ENCOUNTER — Encounter: Payer: Self-pay | Admitting: Internal Medicine

## 2021-08-15 ENCOUNTER — Other Ambulatory Visit: Payer: Self-pay

## 2021-08-16 ENCOUNTER — Other Ambulatory Visit: Payer: Self-pay

## 2021-08-17 ENCOUNTER — Inpatient Hospital Stay: Payer: 59

## 2021-08-18 ENCOUNTER — Inpatient Hospital Stay: Payer: 59

## 2021-08-24 ENCOUNTER — Ambulatory Visit: Payer: 59

## 2021-08-25 ENCOUNTER — Inpatient Hospital Stay: Payer: 59

## 2021-08-25 VITALS — BP 122/93 | HR 84 | Temp 97.1°F | Resp 18

## 2021-08-25 DIAGNOSIS — D509 Iron deficiency anemia, unspecified: Secondary | ICD-10-CM | POA: Diagnosis not present

## 2021-08-25 DIAGNOSIS — E611 Iron deficiency: Secondary | ICD-10-CM

## 2021-08-25 DIAGNOSIS — K922 Gastrointestinal hemorrhage, unspecified: Secondary | ICD-10-CM | POA: Diagnosis not present

## 2021-08-25 MED ORDER — SODIUM CHLORIDE 0.9 % IV SOLN
Freq: Once | INTRAVENOUS | Status: AC
  Filled 2021-08-25: qty 250

## 2021-08-25 MED ORDER — IRON SUCROSE 20 MG/ML IV SOLN
200.0000 mg | Freq: Once | INTRAVENOUS | Status: AC
  Administered 2021-08-25: 200 mg via INTRAVENOUS
  Filled 2021-08-25: qty 10

## 2021-08-25 MED ORDER — SODIUM CHLORIDE 0.9 % IV SOLN: 200.0000 mg | Freq: Once | INTRAVENOUS | Status: DC

## 2021-08-25 NOTE — Patient Instructions (Signed)
[facility]  Discharge Instructions: ?Thank you for choosing [facility] to provide your oncology and hematology care.  ?If you have a lab appointment with the Cancer Center, please go directly to the Cancer Center and check in at the registration area. ? ?Wear comfortable clothing and clothing appropriate for easy access to any Portacath or PICC line.  ? ?We strive to give you quality time with your provider. You may need to reschedule your appointment if you arrive late (15 or more minutes).  Arriving late affects you and other patients whose appointments are after yours.  Also, if you miss three or more appointments without notifying the office, you may be dismissed from the clinic at the provider?s discretion.    ?  ?For prescription refill requests, have your pharmacy contact our office and allow 72 hours for refills to be completed.   ? ?Today you received the following chemotherapy and/or immunotherapy agents VENOFER ?    ?  ?To help prevent nausea and vomiting after your treatment, we encourage you to take your nausea medication as directed. ? ?BELOW ARE SYMPTOMS THAT SHOULD BE REPORTED IMMEDIATELY: ?*FEVER GREATER THAN 100.4 F (38 ?C) OR HIGHER ?*CHILLS OR SWEATING ?*NAUSEA AND VOMITING THAT IS NOT CONTROLLED WITH YOUR NAUSEA MEDICATION ?*UNUSUAL SHORTNESS OF BREATH ?*UNUSUAL BRUISING OR BLEEDING ?*URINARY PROBLEMS (pain or burning when urinating, or frequent urination) ?*BOWEL PROBLEMS (unusual diarrhea, constipation, pain near the anus) ?TENDERNESS IN MOUTH AND THROAT WITH OR WITHOUT PRESENCE OF ULCERS (sore throat, sores in mouth, or a toothache) ?UNUSUAL RASH, SWELLING OR PAIN  ?UNUSUAL VAGINAL DISCHARGE OR ITCHING  ? ?Items with * indicate a potential emergency and should be followed up as soon as possible or go to the Emergency Department if any problems should occur. ? ?Please show the CHEMOTHERAPY ALERT CARD or IMMUNOTHERAPY ALERT CARD at check-in to  the Emergency Department and triage nurse. ? ?Should you have questions after your visit or need to cancel or reschedule your appointment, please contact [facility]  [phone] and follow the prompts.  Office hours are 8:00 a.m. to 4:30 p.m. Monday - Friday. Please note that voicemails left after 4:00 p.m. may not be returned until the following business day.  We are closed weekends and major holidays. You have access to a nurse at all times for urgent questions. Please call the main number to the clinic [phone] and follow the prompts. ? ?For any non-urgent questions, you may also contact your provider using MyChart. We now offer e-Visits for anyone 18 and older to request care online for non-urgent symptoms. For details visit [email or web address]. ?  ?Also download the MyChart app! Go to the app store, search "MyChart", open the app, select [facility], and log in with your MyChart username and password. ? ?Due to Covid, a mask is required upon entering the hospital/clinic. If you do not have a mask, one will be given to you upon arrival. For doctor visits, patients may have 1 support person aged 18 or older with them. For treatment visits, patients cannot have anyone with them due to current Covid guidelines and our immunocompromised population.  ? ?Iron Sucrose Injection ?What is this medication? ?IRON SUCROSE (EYE ern SOO krose) treats low levels of iron (iron deficiency anemia) in people with kidney disease. Iron is a mineral that plays an important role in making red blood cells, which carry oxygen from your lungs to the rest of your body. ?This medicine   may be used for other purposes; ask your health care provider or pharmacist if you have questions. ?COMMON BRAND NAME(S): Venofer ?What should I tell my care team before I take this medication? ?They need to know if you have any of these conditions: ?Anemia not caused by low iron levels ?Heart disease ?High levels of  iron in the blood ?Kidney disease ?Liver disease ?An unusual or allergic reaction to iron, other medications, foods, dyes, or preservatives ?Pregnant or trying to get pregnant ?Breast-feeding ?How should I use this medication? ?This medication is for infusion into a vein. It is given in a hospital or clinic setting. ?Talk to your care team about the use of this medication in children. While this medication may be prescribed for children as young as 2 years for selected conditions, precautions do apply. ?Overdosage: If you think you have taken too much of this medicine contact a poison control center or emergency room at once. ?NOTE: This medicine is only for you. Do not share this medicine with others. ?What if I miss a dose? ?It is important not to miss your dose. Call your care team if you are unable to keep an appointment. ?What may interact with this medication? ?Do not take this medication with any of the following: ?Deferoxamine ?Dimercaprol ?Other iron products ?This medication may also interact with the following: ?Chloramphenicol ?Deferasirox ?This list may not describe all possible interactions. Give your health care provider a list of all the medicines, herbs, non-prescription drugs, or dietary supplements you use. Also tell them if you smoke, drink alcohol, or use illegal drugs. Some items may interact with your medicine. ?What should I watch for while using this medication? ?Visit your care team regularly. Tell your care team if your symptoms do not start to get better or if they get worse. You may need blood work done while you are taking this medication. ?You may need to follow a special diet. Talk to your care team. Foods that contain iron include: whole grains/cereals, dried fruits, beans, or peas, leafy green vegetables, and organ meats (liver, kidney). ?What side effects may I notice from receiving this medication? ?Side effects that you should report to your care team as soon as  possible: ?Allergic reactions--skin rash, itching, hives, swelling of the face, lips, tongue, or throat ?Low blood pressure--dizziness, feeling faint or lightheaded, blurry vision ?Shortness of breath ?Side effects that usually do not require medical attention (report to your care team if they continue or are bothersome): ?Flushing ?Headache ?Joint pain ?Muscle pain ?Nausea ?Pain, redness, or irritation at injection site ?This list may not describe all possible side effects. Call your doctor for medical advice about side effects. You may report side effects to FDA at 1-800-FDA-1088. ?Where should I keep my medication? ?This medication is given in a hospital or clinic and will not be stored at home. ?NOTE: This sheet is a summary. It may not cover all possible information. If you have questions about this medicine, talk to your doctor, pharmacist, or health care provider. ?? 2023 Elsevier/Gold Standard (2020-08-19 00:00:00) ? ?

## 2021-09-08 ENCOUNTER — Other Ambulatory Visit: Payer: Self-pay

## 2021-09-08 ENCOUNTER — Inpatient Hospital Stay (HOSPITAL_BASED_OUTPATIENT_CLINIC_OR_DEPARTMENT_OTHER): Payer: 59 | Admitting: Internal Medicine

## 2021-09-08 ENCOUNTER — Encounter: Payer: Self-pay | Admitting: Internal Medicine

## 2021-09-08 ENCOUNTER — Inpatient Hospital Stay: Payer: 59 | Attending: Internal Medicine

## 2021-09-08 ENCOUNTER — Inpatient Hospital Stay: Payer: 59

## 2021-09-08 VITALS — BP 125/82 | HR 77 | Temp 99.4°F | Resp 16 | Wt 198.8 lb

## 2021-09-08 DIAGNOSIS — E611 Iron deficiency: Secondary | ICD-10-CM

## 2021-09-08 DIAGNOSIS — E890 Postprocedural hypothyroidism: Secondary | ICD-10-CM

## 2021-09-08 DIAGNOSIS — E785 Hyperlipidemia, unspecified: Secondary | ICD-10-CM | POA: Diagnosis not present

## 2021-09-08 DIAGNOSIS — I700 Atherosclerosis of aorta: Secondary | ICD-10-CM | POA: Diagnosis not present

## 2021-09-08 DIAGNOSIS — H3530 Unspecified macular degeneration: Secondary | ICD-10-CM | POA: Diagnosis not present

## 2021-09-08 DIAGNOSIS — I2510 Atherosclerotic heart disease of native coronary artery without angina pectoris: Secondary | ICD-10-CM | POA: Diagnosis not present

## 2021-09-08 DIAGNOSIS — D500 Iron deficiency anemia secondary to blood loss (chronic): Secondary | ICD-10-CM | POA: Diagnosis not present

## 2021-09-08 DIAGNOSIS — G4730 Sleep apnea, unspecified: Secondary | ICD-10-CM | POA: Diagnosis not present

## 2021-09-08 DIAGNOSIS — E230 Hypopituitarism: Secondary | ICD-10-CM | POA: Diagnosis not present

## 2021-09-08 DIAGNOSIS — D509 Iron deficiency anemia, unspecified: Secondary | ICD-10-CM | POA: Diagnosis not present

## 2021-09-08 DIAGNOSIS — C73 Malignant neoplasm of thyroid gland: Secondary | ICD-10-CM | POA: Diagnosis not present

## 2021-09-08 LAB — CBC WITH DIFFERENTIAL/PLATELET
Abs Immature Granulocytes: 0 10*3/uL (ref 0.00–0.07)
Basophils Absolute: 0.1 10*3/uL (ref 0.0–0.1)
Basophils Relative: 2 %
Eosinophils Absolute: 0.1 10*3/uL (ref 0.0–0.5)
Eosinophils Relative: 3 %
HCT: 34.9 % — ABNORMAL LOW (ref 39.0–52.0)
Hemoglobin: 11.4 g/dL — ABNORMAL LOW (ref 13.0–17.0)
Immature Granulocytes: 0 %
Lymphocytes Relative: 42 %
Lymphs Abs: 1.4 10*3/uL (ref 0.7–4.0)
MCH: 27.2 pg (ref 26.0–34.0)
MCHC: 32.7 g/dL (ref 30.0–36.0)
MCV: 83.3 fL (ref 80.0–100.0)
Monocytes Absolute: 0.3 10*3/uL (ref 0.1–1.0)
Monocytes Relative: 11 %
Neutro Abs: 1.4 10*3/uL — ABNORMAL LOW (ref 1.7–7.7)
Neutrophils Relative %: 42 %
Platelets: 216 10*3/uL (ref 150–400)
RBC: 4.19 MIL/uL — ABNORMAL LOW (ref 4.22–5.81)
RDW: 15.4 % (ref 11.5–15.5)
WBC: 3.2 10*3/uL — ABNORMAL LOW (ref 4.0–10.5)
nRBC: 0 % (ref 0.0–0.2)

## 2021-09-08 LAB — IRON AND TIBC
Iron: 46 ug/dL (ref 45–182)
Saturation Ratios: 12 % — ABNORMAL LOW (ref 17.9–39.5)
TIBC: 389 ug/dL (ref 250–450)
UIBC: 343 ug/dL

## 2021-09-08 LAB — BASIC METABOLIC PANEL
Anion gap: 8 (ref 5–15)
BUN: 19 mg/dL (ref 8–23)
CO2: 25 mmol/L (ref 22–32)
Calcium: 8.3 mg/dL — ABNORMAL LOW (ref 8.9–10.3)
Chloride: 105 mmol/L (ref 98–111)
Creatinine, Ser: 1.05 mg/dL (ref 0.61–1.24)
GFR, Estimated: >60 mL/min (ref >60)
Glucose, Bld: 101 mg/dL — ABNORMAL HIGH (ref 70–99)
Potassium: 3.8 mmol/L (ref 3.5–5.1)
Sodium: 138 mmol/L (ref 135–145)

## 2021-09-08 LAB — T4, FREE: Free T4: 1.34 ng/dL — ABNORMAL HIGH (ref 0.61–1.12)

## 2021-09-08 LAB — LACTATE DEHYDROGENASE: LDH: 117 U/L (ref 98–192)

## 2021-09-08 LAB — TSH: TSH: 1.084 u[IU]/mL (ref 0.350–4.500)

## 2021-09-08 LAB — FERRITIN: Ferritin: 65 ng/mL (ref 24–336)

## 2021-09-08 MED ORDER — SODIUM CHLORIDE 0.9 % IV SOLN: 200.0000 mg | Freq: Once | INTRAVENOUS | Status: DC

## 2021-09-08 MED ORDER — SODIUM CHLORIDE 0.9 % IV SOLN
Freq: Once | INTRAVENOUS | Status: AC
  Filled 2021-09-08: qty 250

## 2021-09-08 MED ORDER — IRON SUCROSE 20 MG/ML IV SOLN
200.0000 mg | Freq: Once | INTRAVENOUS | Status: AC
  Administered 2021-09-08: 200 mg via INTRAVENOUS
  Filled 2021-09-08: qty 10

## 2021-09-08 NOTE — Patient Instructions (Signed)
[facility]  Discharge Instructions: ?Thank you for choosing [facility] to provide your oncology and hematology care.  ?If you have a lab appointment with the Cancer Center, please go directly to the Cancer Center and check in at the registration area. ? ?Wear comfortable clothing and clothing appropriate for easy access to any Portacath or PICC line.  ? ?We strive to give you quality time with your provider. You may need to reschedule your appointment if you arrive late (15 or more minutes).  Arriving late affects you and other patients whose appointments are after yours.  Also, if you miss three or more appointments without notifying the office, you may be dismissed from the clinic at the provider?s discretion.    ?  ?For prescription refill requests, have your pharmacy contact our office and allow 72 hours for refills to be completed.   ? ?Today you received the following chemotherapy and/or immunotherapy agents VENOFER ?    ?  ?To help prevent nausea and vomiting after your treatment, we encourage you to take your nausea medication as directed. ? ?BELOW ARE SYMPTOMS THAT SHOULD BE REPORTED IMMEDIATELY: ?*FEVER GREATER THAN 100.4 F (38 ?C) OR HIGHER ?*CHILLS OR SWEATING ?*NAUSEA AND VOMITING THAT IS NOT CONTROLLED WITH YOUR NAUSEA MEDICATION ?*UNUSUAL SHORTNESS OF BREATH ?*UNUSUAL BRUISING OR BLEEDING ?*URINARY PROBLEMS (pain or burning when urinating, or frequent urination) ?*BOWEL PROBLEMS (unusual diarrhea, constipation, pain near the anus) ?TENDERNESS IN MOUTH AND THROAT WITH OR WITHOUT PRESENCE OF ULCERS (sore throat, sores in mouth, or a toothache) ?UNUSUAL RASH, SWELLING OR PAIN  ?UNUSUAL VAGINAL DISCHARGE OR ITCHING  ? ?Items with * indicate a potential emergency and should be followed up as soon as possible or go to the Emergency Department if any problems should occur. ? ?Please show the CHEMOTHERAPY ALERT CARD or IMMUNOTHERAPY ALERT CARD at check-in to  the Emergency Department and triage nurse. ? ?Should you have questions after your visit or need to cancel or reschedule your appointment, please contact [facility]  [phone] and follow the prompts.  Office hours are 8:00 a.m. to 4:30 p.m. Monday - Friday. Please note that voicemails left after 4:00 p.m. may not be returned until the following business day.  We are closed weekends and major holidays. You have access to a nurse at all times for urgent questions. Please call the main number to the clinic [phone] and follow the prompts. ? ?For any non-urgent questions, you may also contact your provider using MyChart. We now offer e-Visits for anyone 18 and older to request care online for non-urgent symptoms. For details visit [email or web address]. ?  ?Also download the MyChart app! Go to the app store, search "MyChart", open the app, select [facility], and log in with your MyChart username and password. ? ?Due to Covid, a mask is required upon entering the hospital/clinic. If you do not have a mask, one will be given to you upon arrival. For doctor visits, patients may have 1 support person aged 18 or older with them. For treatment visits, patients cannot have anyone with them due to current Covid guidelines and our immunocompromised population.  ? ?Iron Sucrose Injection ?What is this medication? ?IRON SUCROSE (EYE ern SOO krose) treats low levels of iron (iron deficiency anemia) in people with kidney disease. Iron is a mineral that plays an important role in making red blood cells, which carry oxygen from your lungs to the rest of your body. ?This medicine   may be used for other purposes; ask your health care provider or pharmacist if you have questions. ?COMMON BRAND NAME(S): Venofer ?What should I tell my care team before I take this medication? ?They need to know if you have any of these conditions: ?Anemia not caused by low iron levels ?Heart disease ?High levels of  iron in the blood ?Kidney disease ?Liver disease ?An unusual or allergic reaction to iron, other medications, foods, dyes, or preservatives ?Pregnant or trying to get pregnant ?Breast-feeding ?How should I use this medication? ?This medication is for infusion into a vein. It is given in a hospital or clinic setting. ?Talk to your care team about the use of this medication in children. While this medication may be prescribed for children as young as 2 years for selected conditions, precautions do apply. ?Overdosage: If you think you have taken too much of this medicine contact a poison control center or emergency room at once. ?NOTE: This medicine is only for you. Do not share this medicine with others. ?What if I miss a dose? ?It is important not to miss your dose. Call your care team if you are unable to keep an appointment. ?What may interact with this medication? ?Do not take this medication with any of the following: ?Deferoxamine ?Dimercaprol ?Other iron products ?This medication may also interact with the following: ?Chloramphenicol ?Deferasirox ?This list may not describe all possible interactions. Give your health care provider a list of all the medicines, herbs, non-prescription drugs, or dietary supplements you use. Also tell them if you smoke, drink alcohol, or use illegal drugs. Some items may interact with your medicine. ?What should I watch for while using this medication? ?Visit your care team regularly. Tell your care team if your symptoms do not start to get better or if they get worse. You may need blood work done while you are taking this medication. ?You may need to follow a special diet. Talk to your care team. Foods that contain iron include: whole grains/cereals, dried fruits, beans, or peas, leafy green vegetables, and organ meats (liver, kidney). ?What side effects may I notice from receiving this medication? ?Side effects that you should report to your care team as soon as  possible: ?Allergic reactions--skin rash, itching, hives, swelling of the face, lips, tongue, or throat ?Low blood pressure--dizziness, feeling faint or lightheaded, blurry vision ?Shortness of breath ?Side effects that usually do not require medical attention (report to your care team if they continue or are bothersome): ?Flushing ?Headache ?Joint pain ?Muscle pain ?Nausea ?Pain, redness, or irritation at injection site ?This list may not describe all possible side effects. Call your doctor for medical advice about side effects. You may report side effects to FDA at 1-800-FDA-1088. ?Where should I keep my medication? ?This medication is given in a hospital or clinic and will not be stored at home. ?NOTE: This sheet is a summary. It may not cover all possible information. If you have questions about this medicine, talk to your doctor, pharmacist, or health care provider. ?? 2023 Elsevier/Gold Standard (2020-08-19 00:00:00) ? ?

## 2021-09-08 NOTE — Progress Notes (Signed)
[facility] CONSULT NOTE  Patient Care Team: Einar Pheasant, MD as PCP - General (Internal Medicine) Einar Pheasant, MD (Internal Medicine) Cammie Sickle, MD as Consulting Physician (Oncology)  CHIEF COMPLAINTS/PURPOSE OF CONSULTATION: ANEMIA   HEMATOLOGY HISTORY:  # ANEMIA capsule- AVMs- actively bleeding [sep 4174]; EGD-; colonoscopy-FEB, 2020 < 2007- EGD-stretching of esophagus. prior history of GI bleed/AVMs more than 15 years ago. April/MAY 2023- GI balloon double enteroscopy/ May 2023 -capsule study-.study was unremarkable.   # CAD [on asprin 81 mg/day s/p stenting; off brillinta]  HISTORY OF PRESENTING ILLNESS: Ambulating independently.  Alone. Matthew Kelley 61 y.o.  male with symptomatic iron deficiency [with poor response to p.o. iron] is here for follow-up.  Patient had history of GI bleed April 2023.  Had episode of dark-colored stool concerning for melena.  Patient's hemoglobin dropped to 8.3 at that time.  Patient was further evaluated with [facility] balloon double enteroscopy.study was unremarkable.  Also in May 2023 patient had capsule study-at [facility] again unremarkable as per patient.  Patient felt significantly tired post episode of GI bleed. Patient had received IV iron infusions Venofer 2 to 3/month in the last 3 months.  Patient is just on aspirin.  Patient notes his energy level is improved but not back to normal.   Review of Systems  Constitutional:  Positive for malaise/fatigue. Negative for chills, diaphoresis, fever and weight loss.  HENT:  Negative for nosebleeds and sore throat.   Eyes:  Negative for double vision.  Respiratory:  Negative for cough, hemoptysis, sputum production, shortness of breath and wheezing.   Cardiovascular:  Negative for chest pain, palpitations, orthopnea and leg swelling.  Gastrointestinal:  Negative for abdominal pain, blood in stool, constipation, diarrhea, heartburn, melena, nausea and vomiting.   Genitourinary:  Negative for dysuria, frequency and urgency.  Musculoskeletal:  Negative for back pain and joint pain.  Skin: Negative.  Negative for itching and rash.  Neurological:  Negative for dizziness, tingling, focal weakness, weakness and headaches.  Endo/Heme/Allergies:  Does not bruise/bleed easily.  Psychiatric/Behavioral:  Negative for depression. The patient is not nervous/anxious and does not have insomnia.    MEDICAL HISTORY:  Past Medical History:  Diagnosis Date   Atypical nevus 09/07/2008   Left scapula, lateral. Moderate atypia.   Blood in stool    h/o   Cancer ([place])    thyroid cancer   Dysplastic nevus 12/20/2009   Spinal lower back. Moderate atypia, edges free   Dysplastic nevus 12/20/2009   Left paraspinal lower back. Mild atypia, edges free.   Dysplastic nevus 11/24/2018   Right lower back. Mild atypia, deep margin involved.   Dysplastic nevus 11/24/2018   Right mid back. Moderate atypia and halo nevus effect. Limited margins free.   H/O total thyroidectomy    History of chicken pox    Hypercholesterolemia    Hypothyroidism    Sleep apnea    Thyroid cancer ([place])    Vertigo     SURGICAL HISTORY: Past Surgical History:  Procedure Laterality Date   CHOLECYSTECTOMY  2000   CORONARY/GRAFT ACUTE MI REVASCULARIZATION N/A 06/16/2020   Procedure: Coronary/Graft Acute MI Revascularization;  Surgeon: [name], MD;  Location: [facility];  Service: Cardiovascular;  Laterality: N/A;   LEFT HEART CATH AND CORONARY ANGIOGRAPHY N/A 06/16/2020   Procedure: LEFT HEART CATH AND CORONARY ANGIOGRAPHY;  Surgeon: [name], MD;  Location: [facility];  Service: Cardiovascular;  Laterality: N/A;   TOTAL THYROIDECTOMY  2009  SOCIAL HISTORY: Social History   Socioeconomic History   Marital status: Married    Spouse name: Not on file   Number of children: Not on file   Years of education: Not on file   Highest education level:  Not on file  Occupational History   Not on file  Tobacco Use   Smoking status: Never   Smokeless tobacco: Never  Vaping Use   Vaping Use: Never used  Substance and Sexual Activity   Alcohol use: Yes    Alcohol/week: 0.0 standard drinks   Drug use: No   Sexual activity: Not on file  Other Topics Concern   Not on file  Social History Narrative   ** Merged History Encounter **       Social Determinants of Health   Financial Resource Strain: Not on file  Food Insecurity: Not on file  Transportation Needs: Not on file  Physical Activity: Not on file  Stress: Not on file  Social Connections: Not on file  Intimate Partner Violence: Not on file    FAMILY HISTORY: Family History  Problem Relation Age of Onset   Hyperlipidemia Father    Heart disease Father     ALLERGIES:  has No Known Allergies.  MEDICATIONS:  Current Outpatient Medications  Medication Sig Dispense Refill   Alirocumab (PRALUENT) 150 MG/ML SOAJ Inject 150 mg subcutaneously every 14 (fourteen) days 6 mL 4   aspirin 81 MG chewable tablet Chew 1 tablet (81 mg total) by mouth daily. 30 tablet 2   cholecalciferol (VITAMIN D) 1000 units tablet Take 1 tablet (1,000 Units total) by mouth daily. 90 tablet 1   cycloSPORINE (RESTASIS) 0.05 % ophthalmic emulsion PLACE ONE DROP IN BOTH EYES TWICE A DAY 180 each 3   finasteride (PROPECIA) 1 MG tablet TAKE 1 TABLET BY MOUTH ONCE DAILY 90 tablet 10   ketoconazole (NIZORAL) 2 % shampoo Apply 1 application topically 2 (two) times a week. 360 mL 3   levothyroxine (SYNTHROID) 175 MCG tablet Take 175 mcg by mouth daily before breakfast.     minoxidil (LONITEN) 2.5 MG tablet Take 2 tablets (5 mg total) by mouth daily. 180 tablet 3   Multiple Vitamins-Minerals (PRESERVISION AREDS 2) CAPS      valACYclovir (VALTREX) 1000 MG tablet Take 1 tablet (1,000 mg total) by mouth 2 (two) times daily. (Patient taking differently: Take 1,000 mg by mouth 2 (two) times daily as needed.) 180  tablet 3   atorvastatin (LIPITOR) 10 MG tablet Take 1 tablet (10 mg total) by mouth once daily (Patient not taking: Reported on 09/08/2021) 90 tablet 4   ISOtretinoin (ABSORICA) 20 MG capsule Take 1 capsule (20 mg total) by mouth 2 (two) times daily. (Patient not taking: Reported on 09/08/2021) 60 capsule 0   metoprolol tartrate (LOPRESSOR) 25 MG tablet Take 1 tablet (25 mg total) by mouth 2 (two) times daily (Patient not taking: Reported on 09/08/2021) 180 tablet 3   No current facility-administered medications for this visit.      PHYSICAL EXAMINATION:   Vitals:   09/08/21 1300  BP: 125/82  Pulse: 77  Resp: 16  Temp: 99.4 F (37.4 C)   Filed Weights   09/08/21 1300  Weight: 198 lb 12.8 oz (90.2 kg)    Physical Exam Vitals and nursing note reviewed.  HENT:     Head: Normocephalic and atraumatic.     Mouth/Throat:     Pharynx: Oropharynx is clear.  Eyes:     Extraocular Movements: Extraocular movements  intact.     Pupils: Pupils are equal, round, and reactive to light.  Cardiovascular:     Rate and Rhythm: Normal rate and regular rhythm.  Abdominal:     Palpations: Abdomen is soft.  Musculoskeletal:        General: Normal range of motion.     Cervical back: Normal range of motion.  Skin:    General: Skin is warm.  Neurological:     General: No focal deficit present.     Mental Status: He is alert and oriented to person, place, and time.  Psychiatric:        Behavior: Behavior normal.        Judgment: Judgment normal.   LABORATORY DATA:  I have reviewed the data as listed Lab Results  Component Value Date   WBC 3.2 (L) 09/08/2021   HGB 11.4 (L) 09/08/2021   HCT 34.9 (L) 09/08/2021   MCV 83.3 09/08/2021   PLT 216 09/08/2021   Recent Labs    12/30/20 0918 03/10/21 0817 06/09/21 1257 09/08/21 1317  NA 141 139 137 138  K 4.3 4.3 4.1 3.8  CL 105 105 105 105  CO2 '30 28 28 25  '$ GLUCOSE 101* 113* 104* 101*  BUN '17 20 21 19  '$ CREATININE 1.10 1.12 1.09 1.05   CALCIUM 8.7 8.5* 8.2* 8.3*  GFRNONAA  --  >60 >60 >60  PROT 6.3 6.8  --   --   ALBUMIN 4.2 4.2  --   --   AST 14 19  --   --   ALT 12 29  --   --   ALKPHOS 68 69  --   --   BILITOT 0.4 0.4  --   --   BILIDIR 0.1  --   --   --      No results found.  Iron deficiency # Anemia- iron deficiency-likely secondary to occult GI bleed [see below].  S/p Venofer.  #Patient needing Venofer infusions at least 2/month in the last months.  Hemoglobin today is 11.2; proceed with Venofer every 2 weeks.   #Etiology: Iron deficiency. unclear likely occult/ secondary to chronic GI bleeding/AVMs-s/p capsule study/ double balloon enteroscopy/capsule [April/MAY 2023]-do not suspect any bone marrow/PNH.  Check LDH.  Do not see any value to bone marrow at this time.  However recommend a colonoscopy.  We will make a referral to [facility].   # CAD - [off Brillinta- dec 2022; on asprin 81 mg/day]-STABLE.   # DISPOSITION: fridays # referral to [facility]- re: iron def anemia.colonoscopy  add LDH to labs today # Venofer today # venofer every 2 weeks- June; July; Aug # lab- every 4 weeks- cbc # follow up in 3 months- MD; labs- cbc/bmp; iron studies/ferritin-possible venofer- Dr.B        All questions were answered. The patient knows to call the clinic with any problems, questions or concerns.      Cammie Sickle, MD 09/08/2021 3:01 PM

## 2021-09-08 NOTE — Assessment & Plan Note (Addendum)
#   Anemia- iron deficiency-likely secondary to occult GI bleed [see below].  S/p Venofer.  #Patient needing Venofer infusions at least 2/month in the last months.  Hemoglobin today is 11.2; proceed with Venofer every 2 weeks.   #Etiology: Iron deficiency. unclear likely occult/ secondary to chronic GI bleeding/AVMs-s/p capsule study/ double balloon enteroscopy/capsule [April/MAY 2023]-do not suspect any bone marrow/PNH.  Check LDH.  Do not see any value to bone marrow at this time.  However recommend a colonoscopy.  We will make a referral to [facility].   # CAD - [off Brillinta- dec 2022; on asprin 81 mg/day]-STABLE.   # DISPOSITION: fridays # referral to [facility] GI- re: iron def anemia.colonoscopy  add LDH to labs today # Venofer today # venofer every 2 weeks- June; July; Aug # lab- every 4 weeks- cbc # follow up in 3 months- MD; labs- cbc/bmp; iron studies/ferritin-possible venofer- Dr.B

## 2021-09-08 NOTE — Progress Notes (Signed)
Patient wold like to discuss studies done at [facility].

## 2021-09-11 ENCOUNTER — Encounter: Payer: Self-pay | Admitting: Internal Medicine

## 2021-09-19 ENCOUNTER — Ambulatory Visit: Payer: 59 | Admitting: Dermatology

## 2021-09-20 ENCOUNTER — Ambulatory Visit (INDEPENDENT_AMBULATORY_CARE_PROVIDER_SITE_OTHER): Payer: 59 | Admitting: Dermatology

## 2021-09-20 ENCOUNTER — Other Ambulatory Visit: Payer: Self-pay

## 2021-09-20 DIAGNOSIS — B079 Viral wart, unspecified: Secondary | ICD-10-CM | POA: Diagnosis not present

## 2021-09-20 DIAGNOSIS — L80 Vitiligo: Secondary | ICD-10-CM | POA: Diagnosis not present

## 2021-09-20 MED ORDER — OPZELURA 1.5 % EX CREA
1.0000 "application " | TOPICAL_CREAM | Freq: Two times a day (BID) | CUTANEOUS | 3 refills | Status: DC
  Filled 2021-09-20 – 2021-10-27 (×4): qty 180, 90d supply, fill #0

## 2021-09-20 NOTE — Patient Instructions (Addendum)
Cantharidin Plus is a blistering agent that comes from a beetle.  It needs to be washed off in about 4-6 hours after application.  Although it is painless when applied in office, it may cause symptoms of mild pain and burning several hours later.  Treated areas will swell and turn red, and blisters may form.  Vaseline and a bandaid may be applied until wound has healed.  Once healed, the skin may remain temporarily discolored.  It can take weeks to months for pigmentation to return to normal.  Advised to wash off with soap and water in 4-6 hours or sooner if it becomes tender before then.  Cryotherapy Aftercare  Wash gently with soap and water everyday.   Apply Vaseline and Band-Aid daily until healed.        Due to recent changes in healthcare laws, you may see results of your pathology and/or laboratory studies on MyChart before the doctors have had a chance to review them. We understand that in some cases there may be results that are confusing or concerning to you. Please understand that not all results are received at the same time and often the doctors may need to interpret multiple results in order to provide you with the best plan of care or course of treatment. Therefore, we ask that you please give [place] 2 business days to thoroughly review all your results before contacting the office for clarification. Should we see a critical lab result, you will be contacted sooner.   If You Need Anything After Your Visit  If you have any questions or concerns for your doctor, please call our main line at [phone] and press option 4 to reach your doctor's medical assistant. If no one answers, please leave a voicemail as directed and we will return your call as soon as possible. Messages left after 4 pm will be answered the following business day.   You may also send [place] a message via [place]. We typically respond to MyChart messages within 1-2 business days.  For prescription refills, please ask  your pharmacy to contact our office. Our fax number is [phone].  If you have an urgent issue when the clinic is closed that cannot wait until the next business day, you can page your doctor at the number below.    Please note that while we do our best to be available for urgent issues outside of office hours, we are not available 24/7.   If you have an urgent issue and are unable to reach [place], you may choose to seek medical care at your doctor's office, retail clinic, urgent care center, or emergency room.  If you have a medical emergency, please immediately call 911 or go to the emergency department.  Pager Numbers  - Dr. Nehemiah Massed: [phone]  - Dr. Laurence Ferrari: [phone]  - Dr. Nicole Kindred: [phone]  In the event of inclement weather, please call our main line at [phone] for an update on the status of any delays or closures.  Dermatology Medication Tips: Please keep the boxes that topical medications come in in order to help keep track of the instructions about where and how to use these. Pharmacies typically print the medication instructions only on the boxes and not directly on the medication tubes.   If your medication is too expensive, please contact our office at [phone] option 4 or send [place] a message through [place].   We are unable to tell what your co-pay for medications will be in advance as this is  different depending on your insurance coverage. However, we may be able to find a substitute medication at lower cost or fill out paperwork to get insurance to cover a needed medication.   If a prior authorization is required to get your medication covered by your insurance company, please allow [place] 1-2 business days to complete this process.  Drug prices often vary depending on where the prescription is filled and some pharmacies may offer cheaper prices.  The website www.goodrx.com contains coupons for medications through different pharmacies. The prices here do not  account for what the cost may be with help from insurance (it may be cheaper with your insurance), but the website can give you the price if you did not use any insurance.  - You can print the associated coupon and take it with your prescription to the pharmacy.  - You may also stop by our office during regular business hours and pick up a GoodRx coupon card.  - If you need your prescription sent electronically to a different pharmacy, notify our office through [facility] or by phone at [phone] option 4.     Si Usted Necesita Algo Despus de Su Visita  Tambin puede enviarnos un mensaje a travs de Pharmacist, community. Por lo general respondemos a los mensajes de MyChart en el transcurso de 1 a 2 das hbiles.  Para renovar recetas, por favor pida a su farmacia que se ponga en contacto con nuestra oficina. Harland Dingwall de fax es [place] [phone].  Si tiene un asunto urgente cuando la clnica est cerrada y que no puede esperar hasta el siguiente da hbil, puede llamar/localizar a su doctor(a) al nmero que aparece a continuacin.   Por favor, tenga en cuenta que aunque hacemos todo lo posible para estar disponibles para asuntos urgentes fuera del horario de [place], no estamos disponibles las 24 horas del da, los 7 das de la [employer].   Si tiene un problema urgente y no puede comunicarse con nosotros, puede optar por buscar atencin mdica  en el consultorio de su doctor(a), en una clnica privada, en un centro de atencin urgente o en una sala de emergencias.  Si tiene Engineering geologist, por favor llame inmediatamente al 911 o vaya a la sala de emergencias.  Nmeros de bper  - Dr. Nehemiah Massed: [phone]  - Dra. Moye: [phone]  - Dra. Nicole Kindred: [phone]  En caso de inclemencias del [place], por favor llame a Johnsie Kindred principal al [phone] para una actualizacin sobre el [place] de cualquier retraso o cierre.  Consejos para la medicacin en dermatologa: Por  favor, guarde las cajas en las que vienen los medicamentos de uso tpico para ayudarle a seguir las instrucciones sobre dnde y cmo usarlos. Las farmacias generalmente imprimen las instrucciones del medicamento slo en las cajas y no directamente en los tubos del [place].   Si su medicamento es muy caro, por favor, pngase en contacto con Zigmund Daniel llamando al [phone] y presione la opcin 4 o envenos un mensaje a travs de Pharmacist, community.   No podemos decirle cul ser su copago por los medicamentos por adelantado ya que esto es diferente dependiendo de la cobertura de su seguro. Sin embargo, es posible que podamos encontrar un medicamento sustituto a Electrical engineer un formulario para que el seguro cubra el medicamento que se considera necesario.   Si se requiere una autorizacin previa para que su compaa de seguros [place] su medicamento, por favor permtanos de 1 a 2 das hbiles para completar  este proceso.  Los precios de los medicamentos varan con frecuencia dependiendo del Environmental consultant de dnde se surte la receta y alguna farmacias pueden ofrecer precios ms baratos.  El sitio web www.goodrx.com tiene cupones para medicamentos de Airline pilot. Los precios aqu no tienen en cuenta lo que podra costar con la ayuda del seguro (puede ser ms barato con su seguro), pero el sitio web puede darle el precio si no utiliz Research scientist (physical sciences).  - Puede imprimir el cupn correspondiente y llevarlo con su receta a la farmacia.  - Tambin puede pasar por nuestra oficina durante el horario de atencin regular y Charity fundraiser una tarjeta de cupones de GoodRx.  - Si necesita que su receta se enve electrnicamente a una farmacia diferente, informe a nuestra oficina a travs de MyChart de [place] o por telfono llamando al [phone] y presione la opcin 4.

## 2021-09-20 NOTE — Addendum Note (Signed)
Addended by: Ruthell Rummage A on: 09/20/2021 05:08 PM   Modules accepted: Orders

## 2021-09-20 NOTE — Progress Notes (Signed)
Follow-Up Visit   Subjective  Matthew Kelley is a 61 y.o. male who presents for the following: spot at foot (Patient has concerns with spot at left foot. Concerned it could be a callous or a wart. ) and Vitiligo (Using opzelura cream . Noticed new lightened spots at wrist, arms, hands, back of neck at scalp and axilla.).  Uses opzelura off and on on neck.  Hasn't used on hands yet.   The following portions of the chart were reviewed this encounter and updated as appropriate:      Review of Systems: No other skin or systemic complaints except as noted in HPI or Assessment and Plan.   Objective  Well appearing patient in no apparent distress; mood and affect are within normal limits.  A focused examination was performed including left foot, arms, hands, wrist, back of neck. Relevant physical exam findings are noted in the Assessment and Plan.  left mid plantar foot at ball 4 mm firm verrucous papule        b/l wrist,anterior / posterior neck, b/l axilla Depigmented patches               Assessment & Plan  Viral warts, unspecified type left mid plantar foot at ball  Discussed viral etiology and risk of spread.  Discussed multiple treatments may be required to clear warts.  Discussed possible post-treatment dyspigmentation and risk of recurrence.   Post local anesthesia injection and prior to cryotherapy, Candida injection 0.1 cc injected into left foot   Squaric Acid 3% applied to warts today. Prior to application reviewed risk of inflammation and irritation.   Destruction of lesion - left mid plantar foot at ball  Destruction method: cryotherapy   Informed consent: discussed and consent obtained   Timeout:  patient name, date of birth, surgical site, and procedure verified Lesion destroyed using liquid nitrogen: Yes   Region frozen until ice ball extended beyond lesion: Yes   Outcome: patient tolerated procedure well with no complications    Post-procedure details: wound care instructions given   Additional details:  Prior to procedure, discussed risks of blister formation, small wound, skin dyspigmentation, or rare scar following cryotherapy. Recommend Vaseline ointment to treated areas while healing.   Destruction of lesion - left mid plantar foot at ball  Destruction method: chemical removal   Informed consent: discussed and consent obtained   Timeout:  patient name, date of birth, surgical site, and procedure verified Chemical destruction method: cantharidin   Chemical destruction method comment:  Plus Application time:  6 hours Procedure instructions: patient instructed to wash and dry area   Outcome: patient tolerated procedure well with no complications   Post-procedure details: wound care instructions given   Additional details:  Cantharidin Plus is a blistering agent that comes from a beetle.  It needs to be washed off in about 4-6 hours after application.  Although it is painless when applied in office, it may cause symptoms of mild pain and burning several hours later.  Treated areas will swell and turn red, and blisters may form.  Vaseline and a bandaid may be applied until wound has healed.  Once healed, the skin may remain temporarily discolored.  It can take weeks to months for pigmentation to return to normal.  Advised to wash off with soap and water in 4-6 hours or sooner if it becomes tender before then.    Vitiligo b/l wrist,anterior / posterior neck, b/l axilla  Chronic and persistent condition with duration or expected  duration over one year. Condition is symptomatic/ bothersome to patient. Not currently at goal.   Vitiligo is a chronic autoimmune condition which causes loss of skin pigment and is commonly seen on the face and may also involve areas of trauma like hands, elbows, knees, and ankles. There is no cure and it is difficult to treat.  Treatments include topical steroids and other topical  anti-inflammatory ointments/creams and topical and oral Jak inhibitors.  Sometimes narrow band UV light therapy or Xtrac laser is helpful, both of which require twice weekly treatments for at least 3-6 months.  Antioxidant vitamins, such as Vitamins A,C,E,D, Folic Acid and B12 may be added to enhance treatment.  Start Opzelura 1.5 % cream - apply topically twice daily to aa's of body, neck, hands, axilla .  3 month supply with 1 year rf  Ruxolitinib Phosphate (OPZELURA) 1.5 % CREA - b/l wrist,anterior / posterior neck, b/l axilla Apply 1 application  topically in the morning and at bedtime for 1 dose. To wrist and neck for vitiligo   Return if symptoms worsen or fail to improve. I, Ruthell Rummage, CMA, am acting as scribe for Brendolyn Patty, MD.  Documentation: I have reviewed the above documentation for accuracy and completeness, and I agree with the above.  Brendolyn Patty MD

## 2021-09-21 ENCOUNTER — Other Ambulatory Visit: Payer: Self-pay

## 2021-09-22 ENCOUNTER — Inpatient Hospital Stay: Payer: 59

## 2021-09-22 ENCOUNTER — Other Ambulatory Visit: Payer: Self-pay

## 2021-09-22 VITALS — BP 147/69 | HR 96 | Temp 98.4°F | Resp 18

## 2021-09-22 DIAGNOSIS — E611 Iron deficiency: Secondary | ICD-10-CM

## 2021-09-22 DIAGNOSIS — D509 Iron deficiency anemia, unspecified: Secondary | ICD-10-CM | POA: Diagnosis not present

## 2021-09-22 MED ORDER — SODIUM CHLORIDE 0.9 % IV SOLN
Freq: Once | INTRAVENOUS | Status: AC
  Filled 2021-09-22: qty 250

## 2021-09-22 MED ORDER — IRON SUCROSE 20 MG/ML IV SOLN
200.0000 mg | Freq: Once | INTRAVENOUS | Status: AC
  Administered 2021-09-22: 200 mg via INTRAVENOUS
  Filled 2021-09-22: qty 10

## 2021-09-22 MED ORDER — SODIUM CHLORIDE 0.9 % IV SOLN: 200.0000 mg | Freq: Once | INTRAVENOUS | Status: DC

## 2021-09-26 ENCOUNTER — Other Ambulatory Visit: Payer: Self-pay

## 2021-09-29 ENCOUNTER — Inpatient Hospital Stay: Payer: 59

## 2021-09-29 VITALS — BP 132/74 | HR 75 | Temp 98.1°F | Resp 18

## 2021-09-29 DIAGNOSIS — Z8371 Family history of colonic polyps: Secondary | ICD-10-CM | POA: Diagnosis not present

## 2021-09-29 DIAGNOSIS — D509 Iron deficiency anemia, unspecified: Secondary | ICD-10-CM | POA: Diagnosis not present

## 2021-09-29 DIAGNOSIS — K259 Gastric ulcer, unspecified as acute or chronic, without hemorrhage or perforation: Secondary | ICD-10-CM | POA: Diagnosis not present

## 2021-09-29 DIAGNOSIS — K5520 Angiodysplasia of colon without hemorrhage: Secondary | ICD-10-CM | POA: Diagnosis not present

## 2021-09-29 DIAGNOSIS — E611 Iron deficiency: Secondary | ICD-10-CM

## 2021-09-29 MED ORDER — SODIUM CHLORIDE 0.9 % IV SOLN: 200.0000 mg | Freq: Once | INTRAVENOUS | Status: DC

## 2021-09-29 MED ORDER — IRON SUCROSE 20 MG/ML IV SOLN
200.0000 mg | Freq: Once | INTRAVENOUS | Status: AC
  Administered 2021-09-29: 200 mg via INTRAVENOUS
  Filled 2021-09-29: qty 10

## 2021-09-29 MED ORDER — SODIUM CHLORIDE 0.9 % IV SOLN
Freq: Once | INTRAVENOUS | Status: AC
  Filled 2021-09-29: qty 250

## 2021-10-06 ENCOUNTER — Other Ambulatory Visit: Payer: Self-pay

## 2021-10-25 DIAGNOSIS — G4733 Obstructive sleep apnea (adult) (pediatric): Secondary | ICD-10-CM | POA: Diagnosis not present

## 2021-10-27 ENCOUNTER — Other Ambulatory Visit: Payer: Self-pay

## 2021-10-27 ENCOUNTER — Inpatient Hospital Stay: Payer: 59

## 2021-10-27 ENCOUNTER — Inpatient Hospital Stay: Payer: 59 | Attending: Internal Medicine

## 2021-10-27 VITALS — BP 125/77 | HR 70 | Temp 97.6°F | Resp 17

## 2021-10-27 DIAGNOSIS — K922 Gastrointestinal hemorrhage, unspecified: Secondary | ICD-10-CM | POA: Insufficient documentation

## 2021-10-27 DIAGNOSIS — E611 Iron deficiency: Secondary | ICD-10-CM

## 2021-10-27 DIAGNOSIS — D500 Iron deficiency anemia secondary to blood loss (chronic): Secondary | ICD-10-CM | POA: Diagnosis not present

## 2021-10-27 LAB — CBC WITH DIFFERENTIAL/PLATELET
Abs Immature Granulocytes: 0.01 10*3/uL (ref 0.00–0.07)
Basophils Absolute: 0 10*3/uL (ref 0.0–0.1)
Basophils Relative: 1 %
Eosinophils Absolute: 0.2 10*3/uL (ref 0.0–0.5)
Eosinophils Relative: 6 %
HCT: 35.8 % — ABNORMAL LOW (ref 39.0–52.0)
Hemoglobin: 11.9 g/dL — ABNORMAL LOW (ref 13.0–17.0)
Immature Granulocytes: 0 %
Lymphocytes Relative: 35 %
Lymphs Abs: 1.4 10*3/uL (ref 0.7–4.0)
MCH: 28.3 pg (ref 26.0–34.0)
MCHC: 33.2 g/dL (ref 30.0–36.0)
MCV: 85 fL (ref 80.0–100.0)
Monocytes Absolute: 0.4 10*3/uL (ref 0.1–1.0)
Monocytes Relative: 10 %
Neutro Abs: 1.9 10*3/uL (ref 1.7–7.7)
Neutrophils Relative %: 48 %
Platelets: 210 10*3/uL (ref 150–400)
RBC: 4.21 MIL/uL — ABNORMAL LOW (ref 4.22–5.81)
RDW: 16.4 % — ABNORMAL HIGH (ref 11.5–15.5)
WBC: 4 10*3/uL (ref 4.0–10.5)
nRBC: 0 % (ref 0.0–0.2)

## 2021-10-27 MED ORDER — SODIUM CHLORIDE 0.9 % IV SOLN
200.0000 mg | Freq: Once | INTRAVENOUS | Status: AC
  Administered 2021-10-27: 200 mg via INTRAVENOUS
  Filled 2021-10-27: qty 200

## 2021-10-27 MED ORDER — SODIUM CHLORIDE 0.9 % IV SOLN
Freq: Once | INTRAVENOUS | Status: AC
  Filled 2021-10-27: qty 250

## 2021-10-30 ENCOUNTER — Other Ambulatory Visit: Payer: Self-pay

## 2021-11-02 ENCOUNTER — Other Ambulatory Visit: Payer: Self-pay

## 2021-11-02 DIAGNOSIS — L80 Vitiligo: Secondary | ICD-10-CM

## 2021-11-02 MED ORDER — OPZELURA 1.5 % EX CREA: 1.0000 "application " | TOPICAL_CREAM | Freq: Two times a day (BID) | CUTANEOUS | 3 refills | Status: DC

## 2021-11-02 NOTE — Progress Notes (Signed)
Patient requesting medication switch to [place].

## 2021-11-03 ENCOUNTER — Inpatient Hospital Stay: Payer: 59

## 2021-11-03 ENCOUNTER — Other Ambulatory Visit: Payer: Self-pay

## 2021-11-03 VITALS — BP 131/78 | HR 72

## 2021-11-03 DIAGNOSIS — H43812 Vitreous degeneration, left eye: Secondary | ICD-10-CM | POA: Diagnosis not present

## 2021-11-03 DIAGNOSIS — H35411 Lattice degeneration of retina, right eye: Secondary | ICD-10-CM | POA: Diagnosis not present

## 2021-11-03 DIAGNOSIS — D500 Iron deficiency anemia secondary to blood loss (chronic): Secondary | ICD-10-CM | POA: Diagnosis not present

## 2021-11-03 DIAGNOSIS — E611 Iron deficiency: Secondary | ICD-10-CM

## 2021-11-03 DIAGNOSIS — H35372 Puckering of macula, left eye: Secondary | ICD-10-CM | POA: Diagnosis not present

## 2021-11-03 DIAGNOSIS — K922 Gastrointestinal hemorrhage, unspecified: Secondary | ICD-10-CM | POA: Diagnosis not present

## 2021-11-03 DIAGNOSIS — H353132 Nonexudative age-related macular degeneration, bilateral, intermediate dry stage: Secondary | ICD-10-CM | POA: Diagnosis not present

## 2021-11-03 MED ORDER — SODIUM CHLORIDE 0.9 % IV SOLN
200.0000 mg | Freq: Once | INTRAVENOUS | Status: AC
  Administered 2021-11-03: 200 mg via INTRAVENOUS
  Filled 2021-11-03: qty 200

## 2021-11-03 MED ORDER — SODIUM CHLORIDE 0.9 % IV SOLN
Freq: Once | INTRAVENOUS | Status: AC
  Filled 2021-11-03: qty 250

## 2021-11-06 ENCOUNTER — Other Ambulatory Visit: Payer: Self-pay

## 2021-11-06 DIAGNOSIS — L80 Vitiligo: Secondary | ICD-10-CM

## 2021-11-06 MED ORDER — OPZELURA 1.5 % EX CREA
1.0000 "application " | TOPICAL_CREAM | Freq: Two times a day (BID) | CUTANEOUS | 3 refills | Status: DC
  Filled 2021-11-06: qty 60, 30d supply, fill #0
  Filled 2021-12-01 – 2021-12-04 (×2): qty 60, 30d supply, fill #1
  Filled 2022-01-12: qty 60, 30d supply, fill #2
  Filled 2022-02-16: qty 60, 30d supply, fill #3

## 2021-11-06 NOTE — Progress Notes (Signed)
RX to [place]. aw

## 2021-11-07 ENCOUNTER — Other Ambulatory Visit: Payer: Self-pay

## 2021-11-10 ENCOUNTER — Other Ambulatory Visit: Payer: Self-pay

## 2021-11-10 ENCOUNTER — Inpatient Hospital Stay: Payer: 59 | Attending: Internal Medicine

## 2021-11-10 ENCOUNTER — Encounter: Payer: Self-pay | Admitting: Internal Medicine

## 2021-11-10 VITALS — BP 146/83 | HR 72 | Temp 96.8°F | Resp 18

## 2021-11-10 DIAGNOSIS — D509 Iron deficiency anemia, unspecified: Secondary | ICD-10-CM | POA: Insufficient documentation

## 2021-11-10 DIAGNOSIS — E611 Iron deficiency: Secondary | ICD-10-CM

## 2021-11-10 MED ORDER — SODIUM CHLORIDE 0.9 % IV SOLN
Freq: Once | INTRAVENOUS | Status: AC
  Filled 2021-11-10: qty 250

## 2021-11-10 MED ORDER — SODIUM CHLORIDE 0.9 % IV SOLN
200.0000 mg | Freq: Once | INTRAVENOUS | Status: AC
  Administered 2021-11-10: 200 mg via INTRAVENOUS
  Filled 2021-11-10: qty 200

## 2021-11-10 NOTE — Patient Instructions (Addendum)
[facility] CANCER CTR AT [place]  Discharge Instructions: Thank you for choosing [facility] to provide your oncology and hematology care.  If you have a lab appointment with the [facility], please go directly to the [facility] and check in at the registration area.  Wear comfortable clothing and clothing appropriate for easy access to any Portacath or PICC line.   We strive to give you quality time with your provider. You may need to reschedule your appointment if you arrive late (15 or more minutes).  Arriving late affects you and other patients whose appointments are after yours.  Also, if you miss three or more appointments without notifying the office, you may be dismissed from the clinic at the provider's discretion.      For prescription refill requests, have your pharmacy contact our office and allow 72 hours for refills to be completed.    Today you received the following chemotherapy and/or immunotherapy agents venofer      To help prevent nausea and vomiting after your treatment, we encourage you to take your nausea medication as directed.  BELOW ARE SYMPTOMS THAT SHOULD BE REPORTED IMMEDIATELY: *FEVER GREATER THAN 100.4 F (38 C) OR HIGHER *CHILLS OR SWEATING *NAUSEA AND VOMITING THAT IS NOT CONTROLLED WITH YOUR NAUSEA MEDICATION *UNUSUAL SHORTNESS OF BREATH *UNUSUAL BRUISING OR BLEEDING *URINARY PROBLEMS (pain or burning when urinating, or frequent urination) *BOWEL PROBLEMS (unusual diarrhea, constipation, pain near the anus) TENDERNESS IN MOUTH AND THROAT WITH OR WITHOUT PRESENCE OF ULCERS (sore throat, sores in mouth, or a toothache) UNUSUAL RASH, SWELLING OR PAIN  UNUSUAL VAGINAL DISCHARGE OR ITCHING   Items with * indicate a potential emergency and should be followed up as soon as possible or go to the Emergency Department if any problems should occur.  Please show the CHEMOTHERAPY ALERT CARD or IMMUNOTHERAPY ALERT CARD at check-in to the  Emergency Department and triage nurse.  Should you have questions after your visit or need to cancel or reschedule your appointment, please contact [facility] CANCER [place] AT [place]  [phone] and follow the prompts.  Office hours are 8:00 a.m. to 4:30 p.m. Monday - Friday. Please note that voicemails left after 4:00 p.m. may not be returned until the following business day.  We are closed weekends and major holidays. You have access to a nurse at all times for urgent questions. Please call the main number to the clinic [phone] and follow the prompts.  For any non-urgent questions, you may also contact your provider using MyChart. We now offer e-Visits for anyone 10 and older to request care online for non-urgent symptoms. For details visit [email or web address].   Also download the MyChart app! Go to the app store, search "MyChart", open the app, select [facility] [place], and log in with your MyChart username and password.  Masks are optional in the cancer centers. If you would like for your care team to wear a mask while they are taking care of you, please let them know. For doctor visits, patients may have with them one support person who is at least 63 years old. At this time, visitors are not allowed in the infusion area.  \[id number]\Iron Sucrose Injection What is this medication? IRON SUCROSE (EYE ern SOO krose) treats low levels of iron (iron deficiency anemia) in people with kidney disease. Iron is a mineral that plays an important role in making red blood cells, which carry oxygen from your lungs to the rest of your body. This medicine may be  used for other purposes; ask your health care provider or pharmacist if you have questions. COMMON BRAND NAME(S): Venofer What should I tell my care team before I take this medication? They need to know if you have any of these conditions: Anemia not caused by low iron levels Heart disease High levels of iron in the  blood Kidney disease Liver disease An unusual or allergic reaction to iron, other medications, foods, dyes, or preservatives Pregnant or trying to get pregnant Breast-feeding How should I use this medication? This medication is for infusion into a vein. It is given in a hospital or clinic setting. Talk to your care team about the use of this medication in children. While this medication may be prescribed for children as young as 2 years for selected conditions, precautions do apply. Overdosage: If you think you have taken too much of this medicine contact a poison control center or emergency room at once. NOTE: This medicine is only for you. Do not share this medicine with others. What if I miss a dose? It is important not to miss your dose. Call your care team if you are unable to keep an appointment. What may interact with this medication? Do not take this medication with any of the following: Deferoxamine Dimercaprol Other iron products This medication may also interact with the following: Chloramphenicol Deferasirox This list may not describe all possible interactions. Give your health care provider a list of all the medicines, herbs, non-prescription drugs, or dietary supplements you use. Also tell them if you smoke, drink alcohol, or use illegal drugs. Some items may interact with your medicine. What should I watch for while using this medication? Visit your care team regularly. Tell your care team if your symptoms do not start to get better or if they get worse. You may need blood work done while you are taking this medication. You may need to follow a special diet. Talk to your care team. Foods that contain iron include: whole grains/cereals, dried fruits, beans, or peas, leafy green vegetables, and organ meats (liver, kidney). What side effects may I notice from receiving this medication? Side effects that you should report to your care team as soon as possible: Allergic  reactions--skin rash, itching, hives, swelling of the face, lips, tongue, or throat Low blood pressure--dizziness, feeling faint or lightheaded, blurry vision Shortness of breath Side effects that usually do not require medical attention (report to your care team if they continue or are bothersome): Flushing Headache Joint pain Muscle pain Nausea Pain, redness, or irritation at injection site This list may not describe all possible side effects. Call your doctor for medical advice about side effects. You may report side effects to FDA at 1-800-FDA-1088. Where should I keep my medication? This medication is given in a hospital or clinic and will not be stored at home. NOTE: This sheet is a summary. It may not cover all possible information. If you have questions about this medicine, talk to your doctor, pharmacist, or health care provider.  2023 Elsevier/Gold Standard (2007-05-17 00:00:00)

## 2021-11-13 ENCOUNTER — Other Ambulatory Visit: Payer: Self-pay

## 2021-11-14 ENCOUNTER — Other Ambulatory Visit: Payer: Self-pay

## 2021-11-17 ENCOUNTER — Ambulatory Visit: Payer: 59

## 2021-11-27 ENCOUNTER — Other Ambulatory Visit: Payer: Self-pay

## 2021-12-01 ENCOUNTER — Inpatient Hospital Stay: Payer: 59

## 2021-12-01 ENCOUNTER — Other Ambulatory Visit: Payer: Self-pay

## 2021-12-01 VITALS — BP 134/75 | HR 80 | Temp 97.0°F

## 2021-12-01 DIAGNOSIS — D509 Iron deficiency anemia, unspecified: Secondary | ICD-10-CM | POA: Diagnosis not present

## 2021-12-01 DIAGNOSIS — E611 Iron deficiency: Secondary | ICD-10-CM

## 2021-12-01 MED ORDER — SODIUM CHLORIDE 0.9 % IV SOLN
200.0000 mg | Freq: Once | INTRAVENOUS | Status: AC
  Administered 2021-12-01: 200 mg via INTRAVENOUS
  Filled 2021-12-01: qty 200

## 2021-12-01 MED ORDER — SODIUM CHLORIDE 0.9 % IV SOLN
Freq: Once | INTRAVENOUS | Status: AC
  Filled 2021-12-01: qty 250

## 2021-12-01 NOTE — Patient Instructions (Signed)

## 2021-12-04 ENCOUNTER — Other Ambulatory Visit: Payer: Self-pay

## 2021-12-05 ENCOUNTER — Other Ambulatory Visit: Payer: Self-pay

## 2021-12-06 ENCOUNTER — Other Ambulatory Visit: Payer: Self-pay

## 2021-12-07 ENCOUNTER — Other Ambulatory Visit: Payer: Self-pay

## 2021-12-08 ENCOUNTER — Other Ambulatory Visit: Payer: 59

## 2021-12-08 ENCOUNTER — Ambulatory Visit: Payer: 59

## 2021-12-08 ENCOUNTER — Ambulatory Visit: Payer: 59 | Admitting: Internal Medicine

## 2021-12-15 ENCOUNTER — Encounter: Payer: Self-pay | Admitting: Internal Medicine

## 2021-12-15 ENCOUNTER — Inpatient Hospital Stay: Payer: 59

## 2021-12-15 ENCOUNTER — Inpatient Hospital Stay: Payer: 59 | Attending: Internal Medicine

## 2021-12-15 ENCOUNTER — Inpatient Hospital Stay (HOSPITAL_BASED_OUTPATIENT_CLINIC_OR_DEPARTMENT_OTHER): Payer: 59 | Admitting: Internal Medicine

## 2021-12-15 DIAGNOSIS — E611 Iron deficiency: Secondary | ICD-10-CM | POA: Diagnosis not present

## 2021-12-15 DIAGNOSIS — D509 Iron deficiency anemia, unspecified: Secondary | ICD-10-CM | POA: Diagnosis not present

## 2021-12-15 LAB — CBC WITH DIFFERENTIAL/PLATELET
Abs Immature Granulocytes: 0.01 10*3/uL (ref 0.00–0.07)
Basophils Absolute: 0 10*3/uL (ref 0.0–0.1)
Basophils Relative: 1 %
Eosinophils Absolute: 0.2 10*3/uL (ref 0.0–0.5)
Eosinophils Relative: 5 %
HCT: 40.1 % (ref 39.0–52.0)
Hemoglobin: 13.6 g/dL (ref 13.0–17.0)
Immature Granulocytes: 0 %
Lymphocytes Relative: 30 %
Lymphs Abs: 1.3 10*3/uL (ref 0.7–4.0)
MCH: 29.2 pg (ref 26.0–34.0)
MCHC: 33.9 g/dL (ref 30.0–36.0)
MCV: 86.1 fL (ref 80.0–100.0)
Monocytes Absolute: 0.4 10*3/uL (ref 0.1–1.0)
Monocytes Relative: 10 %
Neutro Abs: 2.4 10*3/uL (ref 1.7–7.7)
Neutrophils Relative %: 54 %
Platelets: 216 10*3/uL (ref 150–400)
RBC: 4.66 MIL/uL (ref 4.22–5.81)
RDW: 14.6 % (ref 11.5–15.5)
WBC: 4.4 10*3/uL (ref 4.0–10.5)
nRBC: 0 % (ref 0.0–0.2)

## 2021-12-15 LAB — FERRITIN: Ferritin: 87 ng/mL (ref 24–336)

## 2021-12-15 LAB — IRON AND TIBC
Iron: 67 ug/dL (ref 45–182)
Saturation Ratios: 18 % (ref 17.9–39.5)
TIBC: 372 ug/dL (ref 250–450)
UIBC: 305 ug/dL

## 2021-12-15 LAB — BASIC METABOLIC PANEL
Anion gap: 6 (ref 5–15)
BUN: 20 mg/dL (ref 8–23)
CO2: 27 mmol/L (ref 22–32)
Calcium: 8.7 mg/dL — ABNORMAL LOW (ref 8.9–10.3)
Chloride: 108 mmol/L (ref 98–111)
Creatinine, Ser: 1.13 mg/dL (ref 0.61–1.24)
GFR, Estimated: >60 mL/min (ref >60)
Glucose, Bld: 101 mg/dL — ABNORMAL HIGH (ref 70–99)
Potassium: 3.8 mmol/L (ref 3.5–5.1)
Sodium: 141 mmol/L (ref 135–145)

## 2021-12-15 MED ORDER — SODIUM CHLORIDE 0.9 % IV SOLN
200.0000 mg | Freq: Once | INTRAVENOUS | Status: AC
  Administered 2021-12-15: 200 mg via INTRAVENOUS
  Filled 2021-12-15: qty 200

## 2021-12-15 MED ORDER — SODIUM CHLORIDE 0.9 % IV SOLN
Freq: Once | INTRAVENOUS | Status: AC
  Filled 2021-12-15: qty 250

## 2021-12-15 NOTE — Patient Instructions (Signed)
[facility]  Discharge Instructions: Thank you for choosing [facility] to provide your oncology and hematology care.  If you have a lab appointment with the Cancer Center, please go directly to the Cancer Center and check in at the registration area.  Wear comfortable clothing and clothing appropriate for easy access to any Portacath or PICC line.   We strive to give you quality time with your provider. You may need to reschedule your appointment if you arrive late (15 or more minutes).  Arriving late affects you and other patients whose appointments are after yours.  Also, if you miss three or more appointments without notifying the office, you may be dismissed from the clinic at the provider's discretion.      For prescription refill requests, have your pharmacy contact our office and allow 72 hours for refills to be completed.    Today you received the following chemotherapy and/or immunotherapy agents VENOFER      To help prevent nausea and vomiting after your treatment, we encourage you to take your nausea medication as directed.  BELOW ARE SYMPTOMS THAT SHOULD BE REPORTED IMMEDIATELY: *FEVER GREATER THAN 100.4 F (38 C) OR HIGHER *CHILLS OR SWEATING *NAUSEA AND VOMITING THAT IS NOT CONTROLLED WITH YOUR NAUSEA MEDICATION *UNUSUAL SHORTNESS OF BREATH *UNUSUAL BRUISING OR BLEEDING *URINARY PROBLEMS (pain or burning when urinating, or frequent urination) *BOWEL PROBLEMS (unusual diarrhea, constipation, pain near the anus) TENDERNESS IN MOUTH AND THROAT WITH OR WITHOUT PRESENCE OF ULCERS (sore throat, sores in mouth, or a toothache) UNUSUAL RASH, SWELLING OR PAIN  UNUSUAL VAGINAL DISCHARGE OR ITCHING   Items with * indicate a potential emergency and should be followed up as soon as possible or go to the Emergency Department if any problems should occur.  Please show the CHEMOTHERAPY ALERT CARD or IMMUNOTHERAPY ALERT CARD at check-in to the  Emergency Department and triage nurse.  Should you have questions after your visit or need to cancel or reschedule your appointment, please contact [facility]  [phone] and follow the prompts.  Office hours are 8:00 a.m. to 4:30 p.m. Monday - Friday. Please note that voicemails left after 4:00 p.m. may not be returned until the following business day.  We are closed weekends and major holidays. You have access to a nurse at all times for urgent questions. Please call the main number to the clinic [phone] and follow the prompts.  For any non-urgent questions, you may also contact your provider using MyChart. We now offer e-Visits for anyone 18 and older to request care online for non-urgent symptoms. For details visit [email or web address].   Also download the MyChart app! Go to the app store, search "MyChart", open the app, select [facility], and log in with your MyChart username and password.  Masks are optional in the cancer centers. If you would like for your care team to wear a mask while they are taking care of you, please let them know. For doctor visits, patients may have with them one support person who is at least 63 years old. At this time, visitors are not allowed in the infusion area.  Iron Sucrose Injection What is this medication? IRON SUCROSE (EYE ern SOO krose) treats low levels of iron (iron deficiency anemia) in people with kidney disease. Iron is a mineral that plays an important role in making red blood cells, which carry oxygen from your lungs to the rest of your body. This medicine may be   used for other purposes; ask your health care provider or pharmacist if you have questions. COMMON BRAND NAME(S): Venofer What should I tell my care team before I take this medication? They need to know if you have any of these conditions: Anemia not caused by low iron levels Heart disease High levels of iron in the blood Kidney disease Liver  disease An unusual or allergic reaction to iron, other medications, foods, dyes, or preservatives Pregnant or trying to get pregnant Breast-feeding How should I use this medication? This medication is for infusion into a vein. It is given in a hospital or clinic setting. Talk to your care team about the use of this medication in children. While this medication may be prescribed for children as young as 2 years for selected conditions, precautions do apply. Overdosage: If you think you have taken too much of this medicine contact a poison control center or emergency room at once. NOTE: This medicine is only for you. Do not share this medicine with others. What if I miss a dose? It is important not to miss your dose. Call your care team if you are unable to keep an appointment. What may interact with this medication? Do not take this medication with any of the following: Deferoxamine Dimercaprol Other iron products This medication may also interact with the following: Chloramphenicol Deferasirox This list may not describe all possible interactions. Give your health care provider a list of all the medicines, herbs, non-prescription drugs, or dietary supplements you use. Also tell them if you smoke, drink alcohol, or use illegal drugs. Some items may interact with your medicine. What should I watch for while using this medication? Visit your care team regularly. Tell your care team if your symptoms do not start to get better or if they get worse. You may need blood work done while you are taking this medication. You may need to follow a special diet. Talk to your care team. Foods that contain iron include: whole grains/cereals, dried fruits, beans, or peas, leafy green vegetables, and organ meats (liver, kidney). What side effects may I notice from receiving this medication? Side effects that you should report to your care team as soon as possible: Allergic reactions--skin rash, itching, hives,  swelling of the face, lips, tongue, or throat Low blood pressure--dizziness, feeling faint or lightheaded, blurry vision Shortness of breath Side effects that usually do not require medical attention (report to your care team if they continue or are bothersome): Flushing Headache Joint pain Muscle pain Nausea Pain, redness, or irritation at injection site This list may not describe all possible side effects. Call your doctor for medical advice about side effects. You may report side effects to FDA at 1-800-FDA-1088. Where should I keep my medication? This medication is given in a hospital or clinic and will not be stored at home. NOTE: This sheet is a summary. It may not cover all possible information. If you have questions about this medicine, talk to your doctor, pharmacist, or health care provider.  2023 Elsevier/Gold Standard (2007-05-17 00:00:00)   

## 2021-12-15 NOTE — Progress Notes (Unsigned)
No concerns. 

## 2021-12-15 NOTE — Assessment & Plan Note (Addendum)
#   Anemia- iron deficiency-likely secondary to occult GI bleed [see below].  S/p Venofer.  #Patient needing Venofer infusions at least 2/month in the last 6 months.  Hemoglobin today is 13; proceed with Venofer every 2 weeks.   #Etiology: Iron deficiency. unclear likely occult/ secondary to chronic GI bleeding/AVMs-s/p capsule study/ double balloon enteroscopy/capsule [April/MAY 2023]-do not suspect any bone marrow/PNH.  Check LDH.  Do not see any value to bone marrow at this time.  However recommend a colonoscopy.  We will make a referral to [facility]- EGD/[facility]- OCT-NOV 2023.   # CAD - [off Brillinta- dec 2022; on asprin 81 mg/day]-STABLE.   # DISPOSITION: fridays # Venofer today # Every month-- Venofer- # Labs- every OTHER month- cbc # follow up in 6 months- MD; labs- cbc/bmp; iron studies/ferritin-possible venofer- Dr.B

## 2021-12-15 NOTE — Progress Notes (Signed)
[facility] CONSULT NOTE  Patient Care Team: Einar Pheasant, MD as PCP - General (Internal Medicine) Einar Pheasant, MD (Internal Medicine) Cammie Sickle, MD as Consulting Physician (Oncology)  CHIEF COMPLAINTS/PURPOSE OF CONSULTATION: ANEMIA   HEMATOLOGY HISTORY:  # ANEMIA capsule- AVMs- actively bleeding [sep 6789]; EGD-; colonoscopy-FEB, 2020 < 2007- EGD-stretching of esophagus. prior history of GI bleed/AVMs more than 15 years ago. April/MAY 2023- GI balloon double enteroscopy/ May 2023 -capsule study-.study was unremarkable.   # CAD [on asprin 81 mg/day s/p stenting; off brillinta]  HISTORY OF PRESENTING ILLNESS: Ambulating independently.  Alone. [name] 61 y.o.  male with symptomatic iron deficiency [with poor response to p.o. iron] is here for follow-up.  Patient had history of GI bleed April 2023.  Had episode of dark-colored stool concerning for melena.  Patient's hemoglobin dropped to 8.3 at that time.  Patient was further evaluated with [facility] balloon double enteroscopy.study was unremarkable.  Also in May 2023 patient had capsule study-at [facility] again unremarkable as per patient.  Patient felt significantly tired post episode of GI bleed. Patient had received IV iron infusions Venofer 2 to 3/month in the last 3 months.  Patient is just on aspirin.  Patient notes his energy level is improved but not back to normal.   Review of Systems  Constitutional:  Positive for malaise/fatigue. Negative for chills, diaphoresis, fever and weight loss.  HENT:  Negative for nosebleeds and sore throat.   Eyes:  Negative for double vision.  Respiratory:  Negative for cough, hemoptysis, sputum production, shortness of breath and wheezing.   Cardiovascular:  Negative for chest pain, palpitations, orthopnea and leg swelling.  Gastrointestinal:  Negative for abdominal pain, blood in stool, constipation, diarrhea, heartburn, melena, nausea and vomiting.   Genitourinary:  Negative for dysuria, frequency and urgency.  Musculoskeletal:  Negative for back pain and joint pain.  Skin: Negative.  Negative for itching and rash.  Neurological:  Negative for dizziness, tingling, focal weakness, weakness and headaches.  Endo/Heme/Allergies:  Does not bruise/bleed easily.  Psychiatric/Behavioral:  Negative for depression. The patient is not nervous/anxious and does not have insomnia.     MEDICAL HISTORY:  Past Medical History:  Diagnosis Date  . Atypical nevus 09/07/2008   Left scapula, lateral. Moderate atypia.  . Blood in stool    h/o  . Cancer [facility])    thyroid cancer  . Dysplastic nevus 12/20/2009   Spinal lower back. Moderate atypia, edges free  . Dysplastic nevus 12/20/2009   Left paraspinal lower back. Mild atypia, edges free.  Marland Kitchen Dysplastic nevus 11/24/2018   Right lower back. Mild atypia, deep margin involved.  Marland Kitchen Dysplastic nevus 11/24/2018   Right mid back. Moderate atypia and halo nevus effect. Limited margins free.  . H/O total thyroidectomy   . History of chicken pox   . Hypercholesterolemia   . Hypothyroidism   . Sleep apnea   . Thyroid cancer ([place])   . Vertigo     SURGICAL HISTORY: Past Surgical History:  Procedure Laterality Date  . CHOLECYSTECTOMY  2000  . CORONARY/GRAFT ACUTE MI REVASCULARIZATION N/A 06/16/2020   Procedure: Coronary/Graft Acute MI Revascularization;  Surgeon: [name], MD;  Location: [facility];  Service: Cardiovascular;  Laterality: N/A;  . LEFT HEART CATH AND CORONARY ANGIOGRAPHY N/A 06/16/2020   Procedure: LEFT HEART CATH AND CORONARY ANGIOGRAPHY;  Surgeon: [name], MD;  Location: [facility];  Service: Cardiovascular;  Laterality: N/A;  . TOTAL THYROIDECTOMY  2009  SOCIAL HISTORY: Social History   Socioeconomic History  . Marital status: Married    Spouse name: Not on file  . Number of children: Not on file  . Years of education: Not on file  .  Highest education level: Not on file  Occupational History  . Not on file  Tobacco Use  . Smoking status: Never  . Smokeless tobacco: Never  Vaping Use  . Vaping Use: Never used  Substance and Sexual Activity  . Alcohol use: Yes    Alcohol/week: 0.0 standard drinks of alcohol  . Drug use: No  . Sexual activity: Not on file  Other Topics Concern  . Not on file  Social History Narrative   ** Merged History Encounter **       Social Determinants of Health   Financial Resource Strain: Not on file  Food Insecurity: Not on file  Transportation Needs: Not on file  Physical Activity: Not on file  Stress: Not on file  Social Connections: Not on file  Intimate Partner Violence: Not on file    FAMILY HISTORY: Family History  Problem Relation Age of Onset  . Hyperlipidemia Father   . Heart disease Father     ALLERGIES:  has No Known Allergies.  MEDICATIONS:  Current Outpatient Medications  Medication Sig Dispense Refill  . Alirocumab (PRALUENT) 150 MG/ML SOAJ Inject 150 mg subcutaneously every 14 (fourteen) days 6 mL 4  . aspirin 81 MG chewable tablet Chew 1 tablet (81 mg total) by mouth daily. 30 tablet 2  . cholecalciferol (VITAMIN D) 1000 units tablet Take 1 tablet (1,000 Units total) by mouth daily. 90 tablet 1  . cycloSPORINE (RESTASIS) 0.05 % ophthalmic emulsion PLACE ONE DROP IN BOTH EYES TWICE A DAY 180 each 3  . finasteride (PROPECIA) 1 MG tablet TAKE 1 TABLET BY MOUTH ONCE DAILY 90 tablet 10  . ketoconazole (NIZORAL) 2 % shampoo Apply 1 application topically 2 (two) times a week. 360 mL 3  . levothyroxine (SYNTHROID) 175 MCG tablet Take 175 mcg by mouth daily before breakfast.    . minoxidil (LONITEN) 2.5 MG tablet Take 2 tablets (5 mg total) by mouth daily. 180 tablet 3  . Multiple Vitamins-Minerals (PRESERVISION AREDS 2) CAPS     . Ruxolitinib Phosphate (OPZELURA) 1.5 % CREA Apply 1 application  topically in the morning and at bedtime for 1 dose. To wrist and neck  for vitiligo 60 g 3  . atorvastatin (LIPITOR) 10 MG tablet Take 1 tablet (10 mg total) by mouth once daily (Patient not taking: Reported on 09/08/2021) 90 tablet 4  . ISOtretinoin (ABSORICA) 20 MG capsule Take 1 capsule (20 mg total) by mouth 2 (two) times daily. (Patient not taking: Reported on 09/08/2021) 60 capsule 0  . metoprolol tartrate (LOPRESSOR) 25 MG tablet Take 1 tablet (25 mg total) by mouth 2 (two) times daily (Patient not taking: Reported on 09/08/2021) 180 tablet 3  . valACYclovir (VALTREX) 1000 MG tablet Take 1 tablet (1,000 mg total) by mouth 2 (two) times daily. (Patient taking differently: Take 1,000 mg by mouth 2 (two) times daily as needed.) 180 tablet 3   No current facility-administered medications for this visit.      PHYSICAL EXAMINATION:   Vitals:   12/15/21 1450  BP: 120/78  Pulse: 85  Temp: (!) 97.1 F (36.2 C)  SpO2: 100%   Filed Weights   12/15/21 1450  Weight: 201 lb 6.4 oz (91.4 kg)    Physical Exam Vitals and nursing note  reviewed.  HENT:     Head: Normocephalic and atraumatic.     Mouth/Throat:     Pharynx: Oropharynx is clear.  Eyes:     Extraocular Movements: Extraocular movements intact.     Pupils: Pupils are equal, round, and reactive to light.  Cardiovascular:     Rate and Rhythm: Normal rate and regular rhythm.  Abdominal:     Palpations: Abdomen is soft.  Musculoskeletal:        General: Normal range of motion.     Cervical back: Normal range of motion.  Skin:    General: Skin is warm.  Neurological:     General: No focal deficit present.     Mental Status: He is alert and oriented to person, place, and time.  Psychiatric:        Behavior: Behavior normal.        Judgment: Judgment normal.   LABORATORY DATA:  I have reviewed the data as listed Lab Results  Component Value Date   WBC 4.4 12/15/2021   HGB 13.6 12/15/2021   HCT 40.1 12/15/2021   MCV 86.1 12/15/2021   PLT 216 12/15/2021   Recent Labs    12/30/20 0918  12/30/20 0918 03/10/21 0817 06/09/21 1257 09/08/21 1317 12/15/21 1440  NA 141  --  139 137 138 141  K 4.3  --  4.3 4.1 3.8 3.8  CL 105  --  105 105 105 108  CO2 30  --  '28 28 25 27  '$ GLUCOSE 101*  --  113* 104* 101* 101*  BUN 17  --  '20 21 19 20  '$ CREATININE 1.10  --  1.12 1.09 1.05 1.13  CALCIUM 8.7  --  8.5* 8.2* 8.3* 8.7*  GFRNONAA  --    < > >60 >60 >60 >60  PROT 6.3  --  6.8  --   --   --   ALBUMIN 4.2  --  4.2  --   --   --   AST 14  --  19  --   --   --   ALT 12  --  29  --   --   --   ALKPHOS 68  --  69  --   --   --   BILITOT 0.4  --  0.4  --   --   --   BILIDIR 0.1  --   --   --   --   --    < > = values in this interval not displayed.     No results found.  Iron deficiency # Anemia- iron deficiency-likely secondary to occult GI bleed [see below].  S/p Venofer.  #Patient needing Venofer infusions at least 2/month in the last 6 months.  Hemoglobin today is 13; proceed with Venofer every 2 weeks.   #Etiology: Iron deficiency. unclear likely occult/ secondary to chronic GI bleeding/AVMs-s/p capsule study/ double balloon enteroscopy/capsule [April/MAY 2023]-do not suspect any bone marrow/PNH.  Check LDH.  Do not see any value to bone marrow at this time.  However recommend a colonoscopy.  We will make a referral to [facility]- EGD/[facility]- OCT-NOV 2023.   # CAD - [off Brillinta- dec 2022; on asprin 81 mg/day]-STABLE.   # DISPOSITION: fridays # Venofer today # Every month-- Venofer- # Labs- every OTHER month- cbc # follow up in 6 months- MD; labs- cbc/bmp; iron studies/ferritin-possible venofer- Dr.B        All questions were answered. The patient knows to call the  clinic with any problems, questions or concerns.      Cammie Sickle, MD 12/15/2021 3:12 PM

## 2021-12-18 ENCOUNTER — Encounter: Payer: Self-pay | Admitting: Internal Medicine

## 2022-01-05 ENCOUNTER — Ambulatory Visit (INDEPENDENT_AMBULATORY_CARE_PROVIDER_SITE_OTHER): Payer: 59 | Admitting: Internal Medicine

## 2022-01-05 ENCOUNTER — Encounter: Payer: Self-pay | Admitting: Internal Medicine

## 2022-01-05 VITALS — BP 116/64 | HR 100 | Temp 97.9°F | Ht 73.0 in | Wt 201.6 lb

## 2022-01-05 DIAGNOSIS — Z125 Encounter for screening for malignant neoplasm of prostate: Secondary | ICD-10-CM | POA: Diagnosis not present

## 2022-01-05 DIAGNOSIS — G4733 Obstructive sleep apnea (adult) (pediatric): Secondary | ICD-10-CM

## 2022-01-05 DIAGNOSIS — Z114 Encounter for screening for human immunodeficiency virus [HIV]: Secondary | ICD-10-CM

## 2022-01-05 DIAGNOSIS — I25119 Atherosclerotic heart disease of native coronary artery with unspecified angina pectoris: Secondary | ICD-10-CM

## 2022-01-05 DIAGNOSIS — Z0000 Encounter for general adult medical examination without abnormal findings: Secondary | ICD-10-CM

## 2022-01-05 DIAGNOSIS — E7800 Pure hypercholesterolemia, unspecified: Secondary | ICD-10-CM | POA: Diagnosis not present

## 2022-01-05 DIAGNOSIS — Z85850 Personal history of malignant neoplasm of thyroid: Secondary | ICD-10-CM

## 2022-01-05 DIAGNOSIS — D509 Iron deficiency anemia, unspecified: Secondary | ICD-10-CM | POA: Diagnosis not present

## 2022-01-05 DIAGNOSIS — Z1159 Encounter for screening for other viral diseases: Secondary | ICD-10-CM | POA: Diagnosis not present

## 2022-01-05 NOTE — Progress Notes (Signed)
Patient ID: Matthew Kelley, male   DOB: 1960/11/01, 61 y.o.   MRN: [id number]   Subjective:    Patient ID: Matthew Kelley, male    DOB: [birth year], 61 y.o.   MRN: [id number]   Patient here for  Chief Complaint  Patient presents with   Follow-up    physical   .   HPI Reports he is doing relatively well.  Seeing Dr Rogue Bussing - f/u anemia  has received IV iron infusions. Energy level improved. Last hgb - 13.  EGD and colonoscopy planned 01/26/22.  Back to playing tennis, etc.  No chest pain.  No sob reported.  No increased cough/congestion.  No abdominal pain.  No recent rectal bleeding.  No urinary issues.     Past Medical History:  Diagnosis Date   Atypical nevus 09/07/2008   Left scapula, lateral. Moderate atypia.   Blood in stool    h/o   Cancer ([place])    thyroid cancer   Dysplastic nevus 12/20/2009   Spinal lower back. Moderate atypia, edges free   Dysplastic nevus 12/20/2009   Left paraspinal lower back. Mild atypia, edges free.   Dysplastic nevus 11/24/2018   Right lower back. Mild atypia, deep margin involved.   Dysplastic nevus 11/24/2018   Right mid back. Moderate atypia and halo nevus effect. Limited margins free.   H/O total thyroidectomy    History of chicken pox    Hypercholesterolemia    Hypothyroidism    Sleep apnea    Thyroid cancer ([place])    Vertigo    Past Surgical History:  Procedure Laterality Date   CHOLECYSTECTOMY  2000   CORONARY/GRAFT ACUTE MI REVASCULARIZATION N/A 06/16/2020   Procedure: Coronary/Graft Acute MI Revascularization;  Surgeon: [name], MD;  Location: [facility];  Service: Cardiovascular;  Laterality: N/A;   LEFT HEART CATH AND CORONARY ANGIOGRAPHY N/A 06/16/2020   Procedure: LEFT HEART CATH AND CORONARY ANGIOGRAPHY;  Surgeon: [name], MD;  Location: [facility];  Service: Cardiovascular;  Laterality: N/A;   TOTAL THYROIDECTOMY  2009   Family History  Problem Relation Age of Onset    Hyperlipidemia Father    Heart disease Father    Social History   Socioeconomic History   Marital status: Married    Spouse name: Not on file   Number of children: Not on file   Years of education: Not on file   Highest education level: Not on file  Occupational History   Not on file  Tobacco Use   Smoking status: Never   Smokeless tobacco: Never  Vaping Use   Vaping Use: Never used  Substance and Sexual Activity   Alcohol use: Yes    Alcohol/week: 0.0 standard drinks of alcohol   Drug use: No   Sexual activity: Not on file  Other Topics Concern   Not on file  Social History Narrative   ** Merged History Encounter **       Social Determinants of Health   Financial Resource Strain: Not on file  Food Insecurity: Not on file  Transportation Needs: Not on file  Physical Activity: Not on file  Stress: Not on file  Social Connections: Not on file     Review of Systems  Constitutional:  Negative for appetite change and unexpected weight change.  HENT:  Negative for congestion, sinus pressure and sore throat.   Eyes:  Negative for pain and visual disturbance.  Respiratory:  Negative for cough, chest tightness and shortness of breath.  Cardiovascular:  Negative for chest pain and palpitations.  Gastrointestinal:  Negative for abdominal pain, diarrhea, nausea and vomiting.  Genitourinary:  Negative for difficulty urinating and dysuria.  Musculoskeletal:  Negative for joint swelling and myalgias.  Skin:  Negative for color change and rash.  Neurological:  Negative for dizziness, light-headedness and headaches.  Hematological:  Negative for adenopathy. Does not bruise/bleed easily.  Psychiatric/Behavioral:  Negative for agitation and dysphoric mood.        Objective:     BP 116/64 (BP Location: Left Arm, Patient Position: Sitting, Cuff Size: Normal)   Pulse 100   Temp 97.9 F (36.6 C) (Oral)   Ht '6\' 1"'$  (1.854 m)   Wt 201 lb 9.6 oz (91.4 kg)   SpO2 96%   BMI  26.60 kg/m  Wt Readings from Last 3 Encounters:  01/05/22 201 lb 9.6 oz (91.4 kg)  12/15/21 201 lb 6.4 oz (91.4 kg)  09/08/21 198 lb 12.8 oz (90.2 kg)    Physical Exam Constitutional:      General: He is not in acute distress.    Appearance: Normal appearance. He is well-developed.  HENT:     Head: Normocephalic and atraumatic.     Right Ear: External ear normal.     Left Ear: External ear normal.  Eyes:     General: No scleral icterus.       Right eye: No discharge.        Left eye: No discharge.     Conjunctiva/sclera: Conjunctivae normal.  Neck:     Thyroid: No thyromegaly.  Cardiovascular:     Rate and Rhythm: Normal rate and regular rhythm.  Pulmonary:     Effort: No respiratory distress.     Breath sounds: Normal breath sounds. No wheezing.  Abdominal:     General: Bowel sounds are normal.     Palpations: Abdomen is soft.     Tenderness: There is no abdominal tenderness.  Musculoskeletal:        General: No swelling or tenderness.     Cervical back: Neck supple. No tenderness.  Lymphadenopathy:     Cervical: No cervical adenopathy.  Skin:    Findings: No erythema or rash.  Neurological:     Mental Status: He is alert and oriented to person, place, and time.  Psychiatric:        Mood and Affect: Mood normal.        Behavior: Behavior normal.      Outpatient Encounter Medications as of 01/05/2022  Medication Sig   Alirocumab (PRALUENT) 150 MG/ML SOAJ Inject 150 mg subcutaneously every 14 (fourteen) days   aspirin 81 MG chewable tablet Chew 1 tablet (81 mg total) by mouth daily.   cholecalciferol (VITAMIN D) 1000 units tablet Take 1 tablet (1,000 Units total) by mouth daily.   cycloSPORINE (RESTASIS) 0.05 % ophthalmic emulsion PLACE ONE DROP IN BOTH EYES TWICE A DAY   finasteride (PROPECIA) 1 MG tablet TAKE 1 TABLET BY MOUTH ONCE DAILY   ketoconazole (NIZORAL) 2 % shampoo Apply 1 application topically 2 (two) times a week.   levothyroxine (SYNTHROID) 175 MCG  tablet Take 175 mcg by mouth daily before breakfast.   minoxidil (LONITEN) 2.5 MG tablet Take 2 tablets (5 mg total) by mouth daily.   Multiple Vitamins-Minerals (PRESERVISION AREDS 2) CAPS    Ruxolitinib Phosphate (OPZELURA) 1.5 % CREA Apply 1 application  topically in the morning and at bedtime for 1 dose. To wrist and neck for vitiligo   [DISCONTINUED]  atorvastatin (LIPITOR) 10 MG tablet Take 1 tablet (10 mg total) by mouth once daily   [DISCONTINUED] ISOtretinoin (ABSORICA) 20 MG capsule Take 1 capsule (20 mg total) by mouth 2 (two) times daily.   [DISCONTINUED] metoprolol tartrate (LOPRESSOR) 25 MG tablet Take 1 tablet (25 mg total) by mouth 2 (two) times daily   [DISCONTINUED] valACYclovir (VALTREX) 1000 MG tablet Take 1 tablet (1,000 mg total) by mouth 2 (two) times daily. (Patient taking differently: Take 1,000 mg by mouth 2 (two) times daily as needed.)   No facility-administered encounter medications on file as of 01/05/2022.     Lab Results  Component Value Date   WBC 4.4 12/15/2021   HGB 13.6 12/15/2021   HCT 40.1 12/15/2021   PLT 216 12/15/2021   GLUCOSE 101 (H) 12/15/2021   CHOL 170 03/10/2021   TRIG 56 03/10/2021   HDL 49 03/10/2021   LDLCALC 110 (H) 03/10/2021   ALT 29 03/10/2021   AST 19 03/10/2021   NA 141 12/15/2021   K 3.8 12/15/2021   CL 108 12/15/2021   CREATININE 1.13 12/15/2021   BUN 20 12/15/2021   CO2 27 12/15/2021   TSH 1.084 09/08/2021   PSA 0.16 12/30/2020   INR 1.0 06/16/2020   HGBA1C 5.7 09/04/2019       Assessment & Plan:   Problem List Items Addressed This Visit     Anemia    Has been receiving iron infusions.  Last hgb 13.  Continue f/u with Dr Rogue Bussing.        Coronary artery disease with angina pectoris ([place])    History of anterior STEMI s/p PCI/DES of proximal LAD.  Continue aspirin and praluent. Recent stress test ok.  No chest pain now.  Follow.       Health care maintenance    Physical today 01/05/22.  Colonoscopy 05/2018. F/u  planned 01/2022.  Check psa next labs.       Hypercholesterolemia    On praluent now.  Low cholesterol diet and exercise.  Follow lipid panel. Check labs.  Consider adding back a statin.        Relevant Orders   Hepatic function panel   Lipid panel   Basic metabolic panel   Obstructive sleep apnea    CPAP.       THYROID CANCER, HX OF    Followed by Dr Forde Dandy. On synthroid.  Follow tsh.       Relevant Orders   T4, free   TSH   Other Visit Diagnoses     Routine general medical examination at a health care facility    -  Primary   Prostate cancer screening       Relevant Orders   PSA   Encounter for hepatitis C screening test for low risk patient       Relevant Orders   Hepatitis C antibody   Screening for HIV without presence of risk factors       Relevant Orders   HIV antibody (with reflex)        Einar Pheasant, MD

## 2022-01-11 ENCOUNTER — Other Ambulatory Visit: Payer: Self-pay

## 2022-01-11 MED ORDER — PERMETHRIN 5 % EX CREA
1.0000 | TOPICAL_CREAM | CUTANEOUS | 1 refills | Status: DC
  Filled 2022-01-11: qty 120, 14d supply, fill #0

## 2022-01-12 ENCOUNTER — Other Ambulatory Visit: Payer: Self-pay

## 2022-01-12 ENCOUNTER — Inpatient Hospital Stay: Payer: 59 | Attending: Internal Medicine

## 2022-01-12 VITALS — BP 109/65 | HR 89 | Temp 97.3°F

## 2022-01-12 DIAGNOSIS — E611 Iron deficiency: Secondary | ICD-10-CM

## 2022-01-12 DIAGNOSIS — D509 Iron deficiency anemia, unspecified: Secondary | ICD-10-CM | POA: Insufficient documentation

## 2022-01-12 MED ORDER — SODIUM CHLORIDE 0.9 % IV SOLN
Freq: Once | INTRAVENOUS | Status: AC
  Filled 2022-01-12: qty 250

## 2022-01-12 MED ORDER — SODIUM CHLORIDE 0.9 % IV SOLN
200.0000 mg | Freq: Once | INTRAVENOUS | Status: AC
  Administered 2022-01-12: 200 mg via INTRAVENOUS
  Filled 2022-01-12: qty 200

## 2022-01-13 ENCOUNTER — Encounter: Payer: Self-pay | Admitting: Internal Medicine

## 2022-01-13 NOTE — Assessment & Plan Note (Signed)
Followed by Dr Forde Dandy. On synthroid.  Follow tsh.

## 2022-01-13 NOTE — Assessment & Plan Note (Signed)
History of anterior STEMI s/p PCI/DES of proximal LAD.  Continue aspirin and praluent. Recent stress test ok.  No chest pain now.  Follow.

## 2022-01-13 NOTE — Assessment & Plan Note (Signed)
Physical today 01/05/22.  Colonoscopy 05/2018. F/u planned 01/2022.  Check psa next labs.

## 2022-01-13 NOTE — Assessment & Plan Note (Signed)
On praluent now.  Low cholesterol diet and exercise.  Follow lipid panel. Check labs.  Consider adding back a statin.

## 2022-01-13 NOTE — Assessment & Plan Note (Signed)
Has been receiving iron infusions.  Last hgb 13.  Continue f/u with Dr Rogue Bussing.

## 2022-01-13 NOTE — Assessment & Plan Note (Signed)
CPAP.  

## 2022-01-15 ENCOUNTER — Other Ambulatory Visit: Payer: Self-pay

## 2022-01-26 ENCOUNTER — Other Ambulatory Visit: Payer: Self-pay

## 2022-01-26 DIAGNOSIS — K2950 Unspecified chronic gastritis without bleeding: Secondary | ICD-10-CM | POA: Diagnosis not present

## 2022-01-26 DIAGNOSIS — K641 Second degree hemorrhoids: Secondary | ICD-10-CM | POA: Diagnosis not present

## 2022-01-26 DIAGNOSIS — K2970 Gastritis, unspecified, without bleeding: Secondary | ICD-10-CM | POA: Diagnosis not present

## 2022-01-26 DIAGNOSIS — D509 Iron deficiency anemia, unspecified: Secondary | ICD-10-CM | POA: Diagnosis not present

## 2022-01-26 DIAGNOSIS — K5520 Angiodysplasia of colon without hemorrhage: Secondary | ICD-10-CM | POA: Diagnosis not present

## 2022-01-26 HISTORY — PX: COLONOSCOPY WITH ESOPHAGOGASTRODUODENOSCOPY (EGD): SHX5779

## 2022-01-26 MED ORDER — PANTOPRAZOLE SODIUM 40 MG PO TBEC
DELAYED_RELEASE_TABLET | ORAL | 3 refills | Status: DC
  Filled 2022-01-26: qty 90, 90d supply, fill #0

## 2022-02-07 ENCOUNTER — Other Ambulatory Visit: Payer: Self-pay | Admitting: Dermatology

## 2022-02-07 ENCOUNTER — Other Ambulatory Visit: Payer: Self-pay

## 2022-02-07 DIAGNOSIS — G4733 Obstructive sleep apnea (adult) (pediatric): Secondary | ICD-10-CM | POA: Diagnosis not present

## 2022-02-07 MED ORDER — FINASTERIDE 1 MG PO TABS
ORAL_TABLET | Freq: Every day | ORAL | 4 refills | Status: DC
  Filled 2022-02-16: qty 90, 90d supply, fill #0
  Filled 2022-05-18: qty 90, 90d supply, fill #1
  Filled 2022-08-24: qty 90, 90d supply, fill #2

## 2022-02-14 ENCOUNTER — Ambulatory Visit: Payer: 59

## 2022-02-14 ENCOUNTER — Other Ambulatory Visit: Payer: Self-pay

## 2022-02-14 ENCOUNTER — Other Ambulatory Visit: Payer: 59

## 2022-02-14 MED ORDER — PRALUENT 150 MG/ML ~~LOC~~ SOAJ
150.0000 mg | SUBCUTANEOUS | 4 refills | Status: DC
  Filled 2022-02-14 – 2022-06-18 (×5): qty 6, 84d supply, fill #0
  Filled 2022-09-09: qty 6, 84d supply, fill #1

## 2022-02-16 ENCOUNTER — Other Ambulatory Visit: Payer: Self-pay

## 2022-02-16 ENCOUNTER — Encounter: Payer: Self-pay | Admitting: Internal Medicine

## 2022-02-16 ENCOUNTER — Inpatient Hospital Stay: Payer: 59

## 2022-02-16 ENCOUNTER — Inpatient Hospital Stay: Payer: 59 | Attending: Internal Medicine

## 2022-02-16 VITALS — BP 139/87 | HR 86 | Temp 97.1°F | Resp 16

## 2022-02-16 DIAGNOSIS — E611 Iron deficiency: Secondary | ICD-10-CM

## 2022-02-16 DIAGNOSIS — D509 Iron deficiency anemia, unspecified: Secondary | ICD-10-CM | POA: Insufficient documentation

## 2022-02-16 DIAGNOSIS — R7989 Other specified abnormal findings of blood chemistry: Secondary | ICD-10-CM

## 2022-02-16 LAB — COMPREHENSIVE METABOLIC PANEL
ALT: 33 U/L (ref 0–44)
AST: 25 U/L (ref 15–41)
Albumin: 4.3 g/dL (ref 3.5–5.0)
Alkaline Phosphatase: 76 U/L (ref 38–126)
Anion gap: 9 (ref 5–15)
BUN: 20 mg/dL (ref 8–23)
CO2: 27 mmol/L (ref 22–32)
Calcium: 8.6 mg/dL — ABNORMAL LOW (ref 8.9–10.3)
Chloride: 101 mmol/L (ref 98–111)
Creatinine, Ser: 1.2 mg/dL (ref 0.61–1.24)
GFR, Estimated: >60 mL/min (ref >60)
Glucose, Bld: 128 mg/dL — ABNORMAL HIGH (ref 70–99)
Potassium: 3.7 mmol/L (ref 3.5–5.1)
Sodium: 137 mmol/L (ref 135–145)
Total Bilirubin: 0.5 mg/dL (ref 0.3–1.2)
Total Protein: 7.1 g/dL (ref 6.5–8.1)

## 2022-02-16 LAB — IRON AND TIBC
Iron: 75 ug/dL (ref 45–182)
Saturation Ratios: 21 % (ref 17.9–39.5)
TIBC: 353 ug/dL (ref 250–450)
UIBC: 278 ug/dL

## 2022-02-16 LAB — CBC WITH DIFFERENTIAL/PLATELET
Abs Immature Granulocytes: 0.01 10*3/uL (ref 0.00–0.07)
Basophils Absolute: 0 10*3/uL (ref 0.0–0.1)
Basophils Relative: 1 %
Eosinophils Absolute: 0.2 10*3/uL (ref 0.0–0.5)
Eosinophils Relative: 5 %
HCT: 41.2 % (ref 39.0–52.0)
Hemoglobin: 13.9 g/dL (ref 13.0–17.0)
Immature Granulocytes: 0 %
Lymphocytes Relative: 31 %
Lymphs Abs: 1.3 10*3/uL (ref 0.7–4.0)
MCH: 29 pg (ref 26.0–34.0)
MCHC: 33.7 g/dL (ref 30.0–36.0)
MCV: 86 fL (ref 80.0–100.0)
Monocytes Absolute: 0.4 10*3/uL (ref 0.1–1.0)
Monocytes Relative: 9 %
Neutro Abs: 2.2 10*3/uL (ref 1.7–7.7)
Neutrophils Relative %: 54 %
Platelets: 199 10*3/uL (ref 150–400)
RBC: 4.79 MIL/uL (ref 4.22–5.81)
RDW: 13.6 % (ref 11.5–15.5)
WBC: 4.2 10*3/uL (ref 4.0–10.5)
nRBC: 0 % (ref 0.0–0.2)

## 2022-02-16 LAB — FERRITIN: Ferritin: 88 ng/mL (ref 24–336)

## 2022-02-16 MED ORDER — SODIUM CHLORIDE 0.9 % IV SOLN
Freq: Once | INTRAVENOUS | Status: AC
  Filled 2022-02-16: qty 250

## 2022-02-16 MED ORDER — SODIUM CHLORIDE 0.9 % IV SOLN
200.0000 mg | Freq: Once | INTRAVENOUS | Status: AC
  Administered 2022-02-16: 200 mg via INTRAVENOUS
  Filled 2022-02-16: qty 200

## 2022-02-19 ENCOUNTER — Other Ambulatory Visit: Payer: Self-pay

## 2022-02-20 ENCOUNTER — Other Ambulatory Visit: Payer: Self-pay

## 2022-02-20 MED ORDER — SULFAMETHOXAZOLE-TRIMETHOPRIM 800-160 MG PO TABS
1.0000 | ORAL_TABLET | Freq: Two times a day (BID) | ORAL | 2 refills | Status: AC
  Filled 2022-02-20: qty 20, 10d supply, fill #0
  Filled 2022-03-12: qty 20, 10d supply, fill #1
  Filled 2022-03-23: qty 20, 10d supply, fill #2

## 2022-02-23 DIAGNOSIS — K5520 Angiodysplasia of colon without hemorrhage: Secondary | ICD-10-CM | POA: Diagnosis not present

## 2022-02-23 DIAGNOSIS — D500 Iron deficiency anemia secondary to blood loss (chronic): Secondary | ICD-10-CM | POA: Diagnosis not present

## 2022-02-23 DIAGNOSIS — K259 Gastric ulcer, unspecified as acute or chronic, without hemorrhage or perforation: Secondary | ICD-10-CM | POA: Diagnosis not present

## 2022-03-05 DIAGNOSIS — I25119 Atherosclerotic heart disease of native coronary artery with unspecified angina pectoris: Secondary | ICD-10-CM | POA: Diagnosis not present

## 2022-03-07 ENCOUNTER — Encounter: Payer: Self-pay | Admitting: Internal Medicine

## 2022-03-07 ENCOUNTER — Other Ambulatory Visit: Payer: Self-pay

## 2022-03-07 DIAGNOSIS — G4730 Sleep apnea, unspecified: Secondary | ICD-10-CM

## 2022-03-07 MED ORDER — FLUVASTATIN SODIUM ER 80 MG PO TB24
ORAL_TABLET | ORAL | 4 refills | Status: DC
  Filled 2022-03-07: qty 90, 90d supply, fill #0
  Filled 2022-06-11: qty 90, 90d supply, fill #1

## 2022-03-08 ENCOUNTER — Other Ambulatory Visit: Payer: Self-pay

## 2022-03-08 DIAGNOSIS — L80 Vitiligo: Secondary | ICD-10-CM

## 2022-03-08 MED ORDER — OPZELURA 1.5 % EX CREA
1.0000 "application " | TOPICAL_CREAM | Freq: Every day | CUTANEOUS | 3 refills | Status: DC
  Filled 2022-03-08 – 2022-03-13 (×2): qty 180, fill #0
  Filled 2022-03-16: qty 180, 90d supply, fill #0
  Filled 2022-06-11 – 2022-06-22 (×2): qty 180, 90d supply, fill #1
  Filled 2022-12-06: qty 180, 90d supply, fill #2

## 2022-03-08 NOTE — Telephone Encounter (Signed)
Lab orders are in the system.  Do one of you mind scheduling Dr Nehemiah Massed for a fasting lab appt either on 03/16/22 or 03/23/22 - see his message for times.  Thanks.

## 2022-03-12 ENCOUNTER — Other Ambulatory Visit (HOSPITAL_COMMUNITY): Payer: Self-pay

## 2022-03-12 ENCOUNTER — Other Ambulatory Visit: Payer: Self-pay

## 2022-03-13 ENCOUNTER — Other Ambulatory Visit: Payer: Self-pay

## 2022-03-16 ENCOUNTER — Other Ambulatory Visit: Payer: Self-pay

## 2022-03-16 ENCOUNTER — Inpatient Hospital Stay: Payer: 59 | Attending: Internal Medicine

## 2022-03-16 VITALS — BP 143/87 | HR 87 | Temp 97.3°F | Resp 18

## 2022-03-16 DIAGNOSIS — E785 Hyperlipidemia, unspecified: Secondary | ICD-10-CM | POA: Diagnosis not present

## 2022-03-16 DIAGNOSIS — I2102 ST elevation (STEMI) myocardial infarction involving left anterior descending coronary artery: Secondary | ICD-10-CM | POA: Diagnosis not present

## 2022-03-16 DIAGNOSIS — G4730 Sleep apnea, unspecified: Secondary | ICD-10-CM | POA: Diagnosis not present

## 2022-03-16 DIAGNOSIS — H3530 Unspecified macular degeneration: Secondary | ICD-10-CM | POA: Diagnosis not present

## 2022-03-16 DIAGNOSIS — E230 Hypopituitarism: Secondary | ICD-10-CM | POA: Diagnosis not present

## 2022-03-16 DIAGNOSIS — I700 Atherosclerosis of aorta: Secondary | ICD-10-CM | POA: Diagnosis not present

## 2022-03-16 DIAGNOSIS — C73 Malignant neoplasm of thyroid gland: Secondary | ICD-10-CM | POA: Diagnosis not present

## 2022-03-16 DIAGNOSIS — I447 Left bundle-branch block, unspecified: Secondary | ICD-10-CM | POA: Diagnosis not present

## 2022-03-16 DIAGNOSIS — E890 Postprocedural hypothyroidism: Secondary | ICD-10-CM | POA: Diagnosis not present

## 2022-03-16 DIAGNOSIS — E611 Iron deficiency: Secondary | ICD-10-CM

## 2022-03-16 DIAGNOSIS — I2510 Atherosclerotic heart disease of native coronary artery without angina pectoris: Secondary | ICD-10-CM | POA: Diagnosis not present

## 2022-03-16 DIAGNOSIS — D509 Iron deficiency anemia, unspecified: Secondary | ICD-10-CM | POA: Diagnosis not present

## 2022-03-16 DIAGNOSIS — E559 Vitamin D deficiency, unspecified: Secondary | ICD-10-CM | POA: Diagnosis not present

## 2022-03-16 DIAGNOSIS — D500 Iron deficiency anemia secondary to blood loss (chronic): Secondary | ICD-10-CM | POA: Diagnosis not present

## 2022-03-16 MED ORDER — SODIUM CHLORIDE 0.9 % IV SOLN
200.0000 mg | Freq: Once | INTRAVENOUS | Status: AC
  Administered 2022-03-16: 200 mg via INTRAVENOUS
  Filled 2022-03-16: qty 200

## 2022-03-16 MED ORDER — SODIUM CHLORIDE 0.9 % IV SOLN
INTRAVENOUS | Status: DC
  Filled 2022-03-16: qty 250

## 2022-03-16 MED ORDER — SODIUM CHLORIDE 0.9 % IV SOLN
Freq: Once | INTRAVENOUS | Status: DC
  Filled 2022-03-16: qty 250

## 2022-03-16 NOTE — Patient Instructions (Signed)

## 2022-03-20 ENCOUNTER — Other Ambulatory Visit: Payer: Self-pay

## 2022-03-22 ENCOUNTER — Encounter: Payer: Self-pay | Admitting: Internal Medicine

## 2022-03-23 ENCOUNTER — Other Ambulatory Visit: Payer: Self-pay

## 2022-03-23 ENCOUNTER — Other Ambulatory Visit (INDEPENDENT_AMBULATORY_CARE_PROVIDER_SITE_OTHER): Payer: 59

## 2022-03-23 DIAGNOSIS — Z85850 Personal history of malignant neoplasm of thyroid: Secondary | ICD-10-CM

## 2022-03-23 DIAGNOSIS — Z125 Encounter for screening for malignant neoplasm of prostate: Secondary | ICD-10-CM | POA: Diagnosis not present

## 2022-03-23 DIAGNOSIS — E7800 Pure hypercholesterolemia, unspecified: Secondary | ICD-10-CM

## 2022-03-23 DIAGNOSIS — Z1159 Encounter for screening for other viral diseases: Secondary | ICD-10-CM

## 2022-03-23 DIAGNOSIS — Z114 Encounter for screening for human immunodeficiency virus [HIV]: Secondary | ICD-10-CM | POA: Diagnosis not present

## 2022-03-23 LAB — HEPATIC FUNCTION PANEL
ALT: 18 U/L (ref 0–53)
AST: 16 U/L (ref 0–37)
Albumin: 4.2 g/dL (ref 3.5–5.2)
Alkaline Phosphatase: 65 U/L (ref 39–117)
Bilirubin, Direct: 0.2 mg/dL (ref 0.0–0.3)
Total Bilirubin: 0.8 mg/dL (ref 0.2–1.2)
Total Protein: 6.4 g/dL (ref 6.0–8.3)

## 2022-03-23 LAB — TSH: TSH: 1.01 u[IU]/mL (ref 0.35–5.50)

## 2022-03-23 LAB — BASIC METABOLIC PANEL
BUN: 22 mg/dL (ref 6–23)
CO2: 28 mEq/L (ref 19–32)
Calcium: 8.7 mg/dL (ref 8.4–10.5)
Chloride: 105 mEq/L (ref 96–112)
Creatinine, Ser: 1.11 mg/dL (ref 0.40–1.50)
GFR: 70.66 mL/min (ref >60.00)
Glucose, Bld: 103 mg/dL — ABNORMAL HIGH (ref 70–99)
Potassium: 3.8 mEq/L (ref 3.5–5.1)
Sodium: 141 mEq/L (ref 135–145)

## 2022-03-23 LAB — LIPID PANEL
Cholesterol: 172 mg/dL (ref 0–200)
HDL: 44.7 mg/dL (ref >39.00)
LDL Cholesterol: 109 mg/dL — ABNORMAL HIGH (ref 0–99)
NonHDL: 126.93
Total CHOL/HDL Ratio: 4
Triglycerides: 90 mg/dL (ref 0.0–149.0)
VLDL: 18 mg/dL (ref 0.0–40.0)

## 2022-03-23 LAB — PSA: PSA: 0.13 ng/mL (ref 0.10–4.00)

## 2022-03-23 LAB — T4, FREE: Free T4: 1 ng/dL (ref 0.60–1.60)

## 2022-03-24 ENCOUNTER — Encounter: Payer: Self-pay | Admitting: Internal Medicine

## 2022-03-26 LAB — HEPATITIS C ANTIBODY: Hepatitis C Ab: NONREACTIVE

## 2022-03-26 LAB — HIV ANTIBODY (ROUTINE TESTING W REFLEX): HIV 1&2 Ab, 4th Generation: NONREACTIVE

## 2022-03-29 ENCOUNTER — Encounter: Payer: Self-pay | Admitting: Dermatology

## 2022-03-29 ENCOUNTER — Ambulatory Visit (INDEPENDENT_AMBULATORY_CARE_PROVIDER_SITE_OTHER): Payer: 59 | Admitting: Dermatology

## 2022-03-29 ENCOUNTER — Other Ambulatory Visit: Payer: Self-pay

## 2022-03-29 VITALS — Wt 201.0 lb

## 2022-03-29 DIAGNOSIS — L700 Acne vulgaris: Secondary | ICD-10-CM | POA: Diagnosis not present

## 2022-03-29 DIAGNOSIS — L818 Other specified disorders of pigmentation: Secondary | ICD-10-CM | POA: Diagnosis not present

## 2022-03-29 DIAGNOSIS — Z79899 Other long term (current) drug therapy: Secondary | ICD-10-CM

## 2022-03-29 DIAGNOSIS — K130 Diseases of lips: Secondary | ICD-10-CM

## 2022-03-29 DIAGNOSIS — L853 Xerosis cutis: Secondary | ICD-10-CM | POA: Diagnosis not present

## 2022-03-29 MED ORDER — ISOTRETINOIN 20 MG PO CAPS
20.0000 mg | ORAL_CAPSULE | Freq: Two times a day (BID) | ORAL | 0 refills | Status: AC
  Filled 2022-03-29: qty 60, 30d supply, fill #0

## 2022-03-29 NOTE — Progress Notes (Signed)
   Isotretinoin Follow-Up Visit   Subjective  Matthew Kelley is a 61 y.o. male who presents for the following: Acne (Face. On Isotretinoin '20mg'$  twice daily. Tolerating well).  Week # 8   Isotretinoin F/U - 03/29/22 1300       Isotretinoin Follow Up   iPledge # [id number]    Date 03/29/22    Weight 201 lb (91.2 kg)    Acne breakouts since last visit? Yes      Dosage   Target Dosage (mg) 13710    Current (To Date) Dosage (mg) 1200    To Go Dosage (mg) 12510      Side Effects   Skin Chapped Lips;Dry Skin    Gastrointestinal WNL    Neurological WNL    Constitutional WNL             Side effects: Dry skin, dry lips  Denies changes in night vision, shortness of breath, abdominal pain, nausea, vomiting, diarrhea, blood in stool or urine, visual changes, headaches, epistaxis, joint pain, myalgias, mood changes, depression, or suicidal ideation.   The following portions of the chart were reviewed this encounter and updated as appropriate: medications, allergies, medical history  Review of Systems:  No other skin or systemic complaints except as noted in HPI or Assessment and Plan.  Objective  Well appearing patient in no apparent distress; mood and affect are within normal limits.  An examination of the face, neck, chest, and back was performed and relevant findings are noted below.   Head - Anterior (Face) Closed comedones, sebaceous hyperplasia    Assessment & Plan   Acne vulgaris Head - Anterior (Face)  With [facility]  Severe; On Isotretinoin -  requiring FDA mandated monthly evaluations and laboratory monitoring; Chronic and Persistent; Not to Goal   Continue Isotretinoin '20mg'$  twice daily with food.   Patient confirmed in iPledge and isotretinoin sent to pharmacy.   iPledge: [id number] Pharmacy: [facility]   ISOtretinoin (ACCUTANE) 20 MG capsule - Head - Anterior (Face) Take 1 capsule (20 mg total) by mouth 2 (two) times daily.    Xerosis secondary to  isotretinoin therapy - Continue emollients as directed - Xyzal (levocetirizine) once a day and fish oil 1 gram daily may also help with dryness  Cheilitis secondary to isotretinoin therapy - Continue lip balm as directed, Dr. Luvenia Heller Cortibalm recommended  Long term medication management (isotretinoin) - While taking Isotretinoin and for 30 days after you finish the medication, do not share pills, do not donate blood. Isotretinoin is best absorbed when taken with a fatty meal. Isotretinoin can make you sensitive to the sun. Daily careful sun protection including sunscreen SPF 30+ when outdoors is recommended.  Follow-up in 30 days.  I, Emelia Salisbury, CMA, am acting as scribe for Brendolyn Patty, MD.  Documentation: I have reviewed the above documentation for accuracy and completeness, and I agree with the above.  Brendolyn Patty MD

## 2022-03-29 NOTE — Patient Instructions (Signed)
Continue Isotretinoin '20mg'$  twice daily with food.    While taking isotretinoin, do not share pills and do not donate blood. Generic isotretinoin is best absorbed when taken with a fatty meal. Isotretinoin can make you sensitive to the sun. Daily careful sun protection including sunscreen SPF 30+ when outdoors is recommended.    Due to recent changes in healthcare laws, you may see results of your pathology and/or laboratory studies on MyChart before the doctors have had a chance to review them. We understand that in some cases there may be results that are confusing or concerning to you. Please understand that not all results are received at the same time and often the doctors may need to interpret multiple results in order to provide you with the best plan of care or course of treatment. Therefore, we ask that you please give [place] 2 business days to thoroughly review all your results before contacting the office for clarification. Should we see a critical lab result, you will be contacted sooner.   If You Need Anything After Your Visit  If you have any questions or concerns for your doctor, please call our main line at [phone] and press option 4 to reach your doctor's medical assistant. If no one answers, please leave a voicemail as directed and we will return your call as soon as possible. Messages left after 4 pm will be answered the following business day.   You may also send [place] a message via [place]. We typically respond to MyChart messages within 1-2 business days.  For prescription refills, please ask your pharmacy to contact our office. Our fax number is [phone].  If you have an urgent issue when the clinic is closed that cannot wait until the next business day, you can page your doctor at the number below.    Please note that while we do our best to be available for urgent issues outside of office hours, we are not available 24/7.   If you have an urgent issue and are unable to  reach [place], you may choose to seek medical care at your doctor's office, retail clinic, urgent care center, or emergency room.  If you have a medical emergency, please immediately call 911 or go to the emergency department.  Pager Numbers  - Dr. Nehemiah Massed: [phone]  - Dr. Laurence Ferrari: [phone]  - Dr. Nicole Kindred: [phone]  In the event of inclement weather, please call our main line at [phone] for an update on the status of any delays or closures.  Dermatology Medication Tips: Please keep the boxes that topical medications come in in order to help keep track of the instructions about where and how to use these. Pharmacies typically print the medication instructions only on the boxes and not directly on the medication tubes.   If your medication is too expensive, please contact our office at [phone] option 4 or send [place] a message through [place].   We are unable to tell what your co-pay for medications will be in advance as this is different depending on your insurance coverage. However, we may be able to find a substitute medication at lower cost or fill out paperwork to get insurance to cover a needed medication.   If a prior authorization is required to get your medication covered by your insurance company, please allow [place] 1-2 business days to complete this process.  Drug prices often vary depending on where the prescription is filled and some pharmacies may offer cheaper prices.  The website www.goodrx.com contains  coupons for medications through different pharmacies. The prices here do not account for what the cost may be with help from insurance (it may be cheaper with your insurance), but the website can give you the price if you did not use any insurance.  - You can print the associated coupon and take it with your prescription to the pharmacy.  - You may also stop by our office during regular business hours and pick up a GoodRx coupon card.  - If you need your prescription  sent electronically to a different pharmacy, notify our office through [facility] or by phone at [phone] option 4.     Si Usted Necesita Algo Despus de Su Visita  Tambin puede enviarnos un mensaje a travs de Pharmacist, community. Por lo general respondemos a los mensajes de MyChart en el transcurso de 1 a 2 das hbiles.  Para renovar recetas, por favor pida a su farmacia que se ponga en contacto con nuestra oficina. Harland Dingwall de fax es [place] [phone].  Si tiene un asunto urgente cuando la clnica est cerrada y que no puede esperar hasta el siguiente da hbil, puede llamar/localizar a su doctor(a) al nmero que aparece a continuacin.   Por favor, tenga en cuenta que aunque hacemos todo lo posible para estar disponibles para asuntos urgentes fuera del horario de [place], no estamos disponibles las 24 horas del da, los 7 das de la [place].   Si tiene un problema urgente y no puede comunicarse con nosotros, puede optar por buscar atencin mdica  en el consultorio de su doctor(a), en una clnica privada, en un centro de atencin urgente o en una sala de emergencias.  Si tiene Engineering geologist, por favor llame inmediatamente al 911 o vaya a la sala de emergencias.  Nmeros de bper  - Dr. Nehemiah Massed: [phone]  - Dra. Moye: [phone]  - Dra. Nicole Kindred: [phone]  En caso de inclemencias del [place], por favor llame a Johnsie Kindred principal al [phone] para una actualizacin sobre el [place] de cualquier retraso o cierre.  Consejos para la medicacin en dermatologa: Por favor, guarde las cajas en las que vienen los medicamentos de uso tpico para ayudarle a seguir las instrucciones sobre dnde y cmo usarlos. Las farmacias generalmente imprimen las instrucciones del medicamento slo en las cajas y no directamente en los tubos del [place].   Si su medicamento es muy caro, por favor, pngase en contacto con Zigmund Daniel llamando al [phone] y presione  la opcin 4 o envenos un mensaje a travs de Pharmacist, community.   No podemos decirle cul ser su copago por los medicamentos por adelantado ya que esto es diferente dependiendo de la cobertura de su seguro. Sin embargo, es posible que podamos encontrar un medicamento sustituto a Electrical engineer un formulario para que el seguro cubra el medicamento que se considera necesario.   Si se requiere una autorizacin previa para que su compaa de seguros [place] su medicamento, por favor permtanos de 1 a 2 das hbiles para completar este proceso.  Los precios de los medicamentos varan con frecuencia dependiendo del Environmental consultant de dnde se surte la receta y alguna farmacias pueden ofrecer precios ms baratos.  El sitio web www.goodrx.com tiene cupones para medicamentos de Airline pilot. Los precios aqu no tienen en cuenta lo que podra costar con la ayuda del seguro (puede ser ms barato con su seguro), pero el sitio web puede darle el precio si no utiliz Research scientist (physical sciences).  - Puede imprimir el cupn  correspondiente y llevarlo con su receta a la farmacia.  - Tambin puede pasar por nuestra oficina durante el horario de atencin regular y Charity fundraiser una tarjeta de cupones de GoodRx.  - Si necesita que su receta se enve electrnicamente a una farmacia diferente, informe a nuestra oficina a travs de MyChart de [place] o por telfono llamando al [phone] y presione la opcin 4.

## 2022-03-29 NOTE — Progress Notes (Deleted)
   Follow-Up Visit   Subjective  Matthew Kelley is a 61 y.o. male who presents for the following: No chief complaint on file..  ***  The following portions of the chart were reviewed this encounter and updated as appropriate:      {Review of Systems:34166::"No other skin or systemic complaints."}  Objective  Well appearing patient in no apparent distress; mood and affect are within normal limits.  {HOZY:24825::"O full examination was performed including scalp, head, eyes, ears, nose, lips, neck, chest, axillae, abdomen, back, buttocks, bilateral upper extremities, bilateral lower extremities, hands, feet, fingers, toes, fingernails, and toenails. All findings within normal limits unless otherwise noted below."}   Assessment & Plan   No follow-ups on file.

## 2022-03-30 ENCOUNTER — Other Ambulatory Visit: Payer: Self-pay

## 2022-04-10 ENCOUNTER — Encounter: Payer: Self-pay | Admitting: Internal Medicine

## 2022-04-13 ENCOUNTER — Inpatient Hospital Stay: Payer: 59 | Attending: Internal Medicine

## 2022-04-13 ENCOUNTER — Inpatient Hospital Stay: Payer: 59

## 2022-04-13 VITALS — BP 142/83 | HR 80 | Temp 97.2°F | Resp 16

## 2022-04-13 DIAGNOSIS — D509 Iron deficiency anemia, unspecified: Secondary | ICD-10-CM | POA: Insufficient documentation

## 2022-04-13 DIAGNOSIS — E611 Iron deficiency: Secondary | ICD-10-CM

## 2022-04-13 LAB — CBC WITH DIFFERENTIAL/PLATELET
Abs Immature Granulocytes: 0.01 10*3/uL (ref 0.00–0.07)
Basophils Absolute: 0 10*3/uL (ref 0.0–0.1)
Basophils Relative: 1 %
Eosinophils Absolute: 0.2 10*3/uL (ref 0.0–0.5)
Eosinophils Relative: 4 %
HCT: 38.7 % — ABNORMAL LOW (ref 39.0–52.0)
Hemoglobin: 13.5 g/dL (ref 13.0–17.0)
Immature Granulocytes: 0 %
Lymphocytes Relative: 32 %
Lymphs Abs: 1.5 10*3/uL (ref 0.7–4.0)
MCH: 29.5 pg (ref 26.0–34.0)
MCHC: 34.9 g/dL (ref 30.0–36.0)
MCV: 84.7 fL (ref 80.0–100.0)
Monocytes Absolute: 0.5 10*3/uL (ref 0.1–1.0)
Monocytes Relative: 10 %
Neutro Abs: 2.5 10*3/uL (ref 1.7–7.7)
Neutrophils Relative %: 53 %
Platelets: 214 10*3/uL (ref 150–400)
RBC: 4.57 MIL/uL (ref 4.22–5.81)
RDW: 13.5 % (ref 11.5–15.5)
WBC: 4.7 10*3/uL (ref 4.0–10.5)
nRBC: 0 % (ref 0.0–0.2)

## 2022-04-13 MED ORDER — SODIUM CHLORIDE 0.9 % IV SOLN
200.0000 mg | Freq: Once | INTRAVENOUS | Status: AC
  Administered 2022-04-13: 200 mg via INTRAVENOUS
  Filled 2022-04-13: qty 200

## 2022-04-13 MED ORDER — SODIUM CHLORIDE 0.9 % IV SOLN
Freq: Once | INTRAVENOUS | Status: AC
  Filled 2022-04-13: qty 250

## 2022-04-13 NOTE — Patient Instructions (Signed)

## 2022-04-16 ENCOUNTER — Ambulatory Visit: Payer: 59

## 2022-04-16 ENCOUNTER — Other Ambulatory Visit: Payer: 59

## 2022-04-17 ENCOUNTER — Telehealth: Payer: Self-pay

## 2022-04-17 NOTE — Telephone Encounter (Signed)
Noted  

## 2022-04-17 NOTE — Telephone Encounter (Signed)
Lm for patient to ask if he has had prior sleep study.

## 2022-04-18 ENCOUNTER — Ambulatory Visit (INDEPENDENT_AMBULATORY_CARE_PROVIDER_SITE_OTHER): Payer: 59 | Admitting: Primary Care

## 2022-04-18 ENCOUNTER — Encounter: Payer: Self-pay | Admitting: Primary Care

## 2022-04-18 VITALS — BP 126/78 | HR 75 | Temp 97.7°F | Ht 73.0 in | Wt 204.4 lb

## 2022-04-18 DIAGNOSIS — G4733 Obstructive sleep apnea (adult) (pediatric): Secondary | ICD-10-CM | POA: Diagnosis not present

## 2022-04-18 NOTE — Progress Notes (Signed)
$'@Patient'x$  ID: [name], male    DOB: [birth year], 62 y.o.   MRN: [id number]  Chief Complaint  Patient presents with   Consult    Uses CPAP every night. Has had 3 sleep studies in the past.  Uses nasal pillows and has leakage. Uses medium currently. Has been waking up gasping for 4 months. Increased fatigue.    Referring provider: Einar Pheasant, MD  HPI: 62 year old male, never smoked.  Past medical history significant for OSA, STEMI, coronary artery disease, hyperlipidemia, anemia, low testosterone, thyroid cancer, thyroidectomy.  04/18/2022 Patient presents today for sleep consult. Hx sleep apnea, currently on CPAP. He has had 2 prior sleep studies done at [facility]. Polysomnography in 2017 showed moderate obstructive sleep apnea, AHI 17.9 an hour. He reports increased snoring symptoms and waking up gasping for air at night more recently. He is maintained on CPAP at 7cm h20.  DME was originally sleep med/ now [facility]. He is using tap pap mask, size medium nasal mask. Experiencing some airleaks and wants to try size large mask.   Sleep questionnaire Symptoms- snoring  Typical bedtime-10-11pm Time to fall asleep- 20-34mns Nocturnal awakenings- 3 times Start of day- 6:30am-7am Weight changes- 5 lbs Previous sleep study -  05/05/2015 PSG  >> AHI 17.9 with spO2 low 80%  CPAP use- Yes, 7cm h20  Oxygen use- No Epworth score- 17  Airview download 03/19/22-04/17/21 Usage 30/30 days; 30 days (100%) > 4 hours Average usage 6 hours 52 mins Pressure 7cm h20 Airleaks 5.9L/min (95%) AHI 3.0   No Known Allergies  Immunization History  Administered Date(s) Administered   Influenza,inj,Quad PF,6+ Mos 01/15/2014, 12/25/2019, 12/30/2020, 12/26/2021   PFIZER(Purple Top)SARS-COV-2 Vaccination 04/17/2019, 05/08/2019, 12/17/2019   Tdap 04/10/2015    Past Medical History:  Diagnosis Date   Atypical nevus 09/07/2008   Left scapula, lateral. Moderate atypia.   Blood in stool    h/o   Cancer  ([place]    thyroid cancer   Dysplastic nevus 12/20/2009   Spinal lower back. Moderate atypia, edges free   Dysplastic nevus 12/20/2009   Left paraspinal lower back. Mild atypia, edges free.   Dysplastic nevus 11/24/2018   Right lower back. Mild atypia, deep margin involved.   Dysplastic nevus 11/24/2018   Right mid back. Moderate atypia and halo nevus effect. Limited margins free.   H/O total thyroidectomy    History of chicken pox    Hypercholesterolemia    Hypothyroidism    Sleep apnea    Thyroid cancer (HCC)    Vertigo     Tobacco History: Social History   Tobacco Use  Smoking Status Never  Smokeless Tobacco Never   Counseling given: Not Answered   Outpatient Medications Prior to Visit  Medication Sig Dispense Refill   Alirocumab (PRALUENT) 150 MG/ML SOAJ Inject 150 mg subcutaneously every 14 (fourteen) days 6 mL 4   Alirocumab (PRALUENT) 150 MG/ML SOAJ Inject 150 mg subcutaneously every 14 (fourteen) days 6 mL 4   aspirin 81 MG chewable tablet Chew 1 tablet (81 mg total) by mouth daily. 30 tablet 2   cholecalciferol (VITAMIN D) 1000 units tablet Take 1 tablet (1,000 Units total) by mouth daily. 90 tablet 1   cycloSPORINE (RESTASIS) 0.05 % ophthalmic emulsion PLACE ONE DROP IN BOTH EYES TWICE A DAY 180 each 3   finasteride (PROPECIA) 1 MG tablet TAKE 1 TABLET BY MOUTH ONCE DAILY 90 tablet 4   fluvastatin XL (LESCOL XL) 80 MG 24 hr tablet Take 1 tablet (80 mg total)  by mouth once daily 90 tablet 4   ISOtretinoin (ACCUTANE) 20 MG capsule Take 1 capsule (20 mg total) by mouth 2 (two) times daily. 60 capsule 0   ketoconazole (NIZORAL) 2 % shampoo Apply 1 application topically 2 (two) times a week. 360 mL 3   levothyroxine (SYNTHROID) 175 MCG tablet Take 175 mcg by mouth daily before breakfast.     minoxidil (LONITEN) 2.5 MG tablet Take 2 tablets (5 mg total) by mouth daily. 180 tablet 3   Multiple Vitamins-Minerals (PRESERVISION AREDS 2) CAPS      pantoprazole (PROTONIX) 40 MG  tablet Take 1 tablet (40 mg total) by mouth once daily 30 tablet 3   permethrin (ELIMITE) 5 % cream Apply 1 Application topically as directed. 120 g 1   Ruxolitinib Phosphate (OPZELURA) 1.5 % CREA Apply 1 application  topically daily. 180 g 3   No facility-administered medications prior to visit.   Review of Systems  Review of Systems  Constitutional:  Positive for fatigue.  HENT: Negative.    Respiratory:  Positive for apnea.   Psychiatric/Behavioral:  Positive for sleep disturbance.     Physical Exam  BP 126/78 (BP Location: Left Arm, Cuff Size: Normal)   Pulse 75   Temp 97.7 F (36.5 C)   Ht '6\' 1"'$  (1.854 m)   Wt 204 lb 6.4 oz (92.7 kg)   SpO2 99%   BMI 26.97 kg/m  Physical Exam Constitutional:      Appearance: Normal appearance.  HENT:     Mouth/Throat:     Mouth: Mucous membranes are moist.     Pharynx: Oropharynx is clear.  Cardiovascular:     Rate and Rhythm: Normal rate and regular rhythm.  Pulmonary:     Effort: Pulmonary effort is normal.     Breath sounds: Normal breath sounds.  Neurological:     General: No focal deficit present.     Mental Status: He is alert and oriented to person, place, and time. Mental status is at baseline.  Psychiatric:        Mood and Affect: Mood normal.        Behavior: Behavior normal.        Thought Content: Thought content normal.        Judgment: Judgment normal.      Lab Results:  CBC    Component Value Date/Time   WBC 4.7 04/13/2022 1457   RBC 4.57 04/13/2022 1457   HGB 13.5 04/13/2022 1457   HGB 11.3 (L) 03/05/2018 0934   HCT 38.7 (L) 04/13/2022 1457   HCT 35.3 (L) 03/05/2018 0934   PLT 214 04/13/2022 1457   PLT 230 03/05/2018 0934   MCV 84.7 04/13/2022 1457   MCV 78 (L) 03/05/2018 0934   MCH 29.5 04/13/2022 1457   MCHC 34.9 04/13/2022 1457   RDW 13.5 04/13/2022 1457   RDW 13.6 03/05/2018 0934   LYMPHSABS 1.5 04/13/2022 1457   LYMPHSABS 1.5 03/05/2018 0934   MONOABS 0.5 04/13/2022 1457   EOSABS 0.2  04/13/2022 1457   EOSABS 0.3 03/05/2018 0934   BASOSABS 0.0 04/13/2022 1457   BASOSABS 0.1 03/05/2018 0934    BMET    Component Value Date/Time   NA 141 03/23/2022 0738   NA 141 07/17/2019 0000   K 3.8 03/23/2022 0738   CL 105 03/23/2022 0738   CO2 28 03/23/2022 0738   GLUCOSE 103 (H) 03/23/2022 0738   BUN 22 03/23/2022 0738   BUN 17 07/17/2019 0000   CREATININE 1.11  03/23/2022 0738   CALCIUM 8.7 03/23/2022 0738   GFRNONAA >60 02/16/2022 1444   GFRAA 78 01/21/2019 0000    BNP No results found for: "BNP"  ProBNP No results found for: "PROBNP"  Imaging: No results found.   Assessment & Plan:   OSA (obstructive sleep apnea) - Moderate OSA, PSG in 2017>> AHI 17.9/hour. Patient is compliant with CPAP use nightly. Experiencing airleaks with tap pap mask and waking up more frequently at night gasping for air. Associated daytime sleepiness. Epworth score 17. Current CPAP pressure 7cm h20. He is having some occasional residual apneas, total AHI 3.0/hour. Recommend changing CPAP to auto setting (min pressure 6cm h20 and max pressure 9cm h20). Also recommend patient try using wedge pillow while sleeping. DME order for mask fitting. We also discussed Inspire as potential treatment option for his OSA, he will consider this and let our office know if he wants a referral to ENT. FU in 6 months or sooner if needed.   Martyn Ehrich, NP 04/18/2022

## 2022-04-18 NOTE — Assessment & Plan Note (Addendum)
-   Moderate OSA, PSG in 2017>> AHI 17.9/hour. Patient is compliant with CPAP use nightly. Experiencing airleaks with tap pap mask and waking up more frequently at night gasping for air. Associated daytime sleepiness. Epworth score 17. Current CPAP pressure 7cm h20. He is having some occasional residual apneas, total AHI 3.0/hour. Recommend changing CPAP to auto setting (min pressure 6cm h20 and max pressure 9cm h20). Also recommend patient try using wedge pillow while sleeping. DME order for mask fitting. We also discussed Inspire as potential treatment option for his OSA, he will consider this and let our office know if he wants a referral to ENT. FU in 6 months or sooner if needed.

## 2022-04-18 NOTE — Patient Instructions (Addendum)
You are having some occasional apneas on current pressure settings, recommend changing to auto settings. We settings a min pressure and max pressure to allow cpap machine to self adjust based on how much pressure you need to prevent apneas from occurring   Recommendations: Look into getting wedge pillow to elevate head 30 degrees while sleeping on your back Notify us if you are interested in being referred to ENT for inspire consideration  We will mail copy of inspire packed to you Let me know if pressure settings are not working for you   Orders: Mask fitting and renew cpap supplies with [employer] (size large nasal pillows) Change CPAP pressure to auto setting 6-9cm h20  Follow-up: 6 months with [facility] NP or sooner if needed / then annually   CPAP and BIPAP Information CPAP and BIPAP are methods that use air pressure to keep your airways open and to help you breathe well. CPAP and BIPAP use different amounts of pressure. Your health care provider will tell you whether CPAP or BIPAP would be more helpful for you. CPAP stands for "continuous positive airway pressure." With CPAP, the amount of pressure stays the same while you breathe in (inhale) and out (exhale). BIPAP stands for "bi-level positive airway pressure." With BIPAP, the amount of pressure will be higher when you inhale and lower when you exhale. This allows you to take larger breaths. CPAP or BIPAP may be used in the hospital, or your health care provider may want you to use it at home. You may need to have a sleep study before your health care provider can order a machine for you to use at home. What are the advantages? CPAP or BIPAP can be helpful if you have: Sleep apnea. Chronic obstructive pulmonary disease (COPD). Heart failure. Medical conditions that cause muscle weakness, including muscular dystrophy or amyotrophic lateral sclerosis (ALS). Other problems that cause breathing to be shallow, weak, abnormal, or difficult. CPAP  and BIPAP are most commonly used for obstructive sleep apnea (OSA) to keep the airways from collapsing when the muscles relax during sleep. What are the risks? Generally, this is a safe treatment. However, problems may occur, including: Irritated skin or skin sores if the mask does not fit properly. Dry or stuffy nose or nosebleeds. Dry mouth. Feeling gassy or bloated. Sinus or lung infection if the equipment is not cleaned properly. When should CPAP or BIPAP be used? In most cases, the mask only needs to be worn during sleep. Generally, the mask needs to be worn throughout the night and during any daytime naps. People with certain medical conditions may also need to wear the mask at other times, such as when they are awake. Follow instructions from your health care provider about when to use the machine. What happens during CPAP or BIPAP?  Both CPAP and BIPAP are provided by a small machine with a flexible plastic tube that attaches to a plastic mask that you wear. Air is blown through the mask into your nose or mouth. The amount of pressure that is used to blow the air can be adjusted on the machine. Your health care provider will set the pressure setting and help you find the best mask for you. Tips for using the mask Because the mask needs to be snug, some people feel trapped or closed-in (claustrophobic) when first using the mask. If you feel this way, you may need to get used to the mask. One way to do this is to hold the mask loosely  over your nose or mouth and then gradually apply the mask more snugly. You can also gradually increase the amount of time that you use the mask. Masks are available in various types and sizes. If your mask does not fit well, talk with your health care provider about getting a different one. Some common types of masks include: Full face masks, which fit over the mouth and nose. Nasal masks, which fit over the nose. Nasal pillow or prong masks, which fit into the  nostrils. If you are using a mask that fits over your nose and you tend to breathe through your mouth, a chin strap may be applied to help keep your mouth closed. Use a skin barrier to protect your skin as told by your health care provider. Some CPAP and BIPAP machines have alarms that may sound if the mask comes off or develops a leak. If you have trouble with the mask, it is very important that you talk with your health care provider about finding a way to make the mask easier to tolerate. Do not stop using the mask. There could be a negative impact on your health if you stop using the mask. Tips for using the machine Place your CPAP or BIPAP machine on a secure table or stand near an electrical outlet. Know where the on/off switch is on the machine. Follow instructions from your health care provider about how to set the pressure on your machine and when you should use it. Do not eat or drink while the CPAP or BIPAP machine is on. Food or fluids could get pushed into your lungs by the pressure of the CPAP or BIPAP. For home use, CPAP and BIPAP machines can be rented or purchased through home health care companies. Many different brands of machines are available. Renting a machine before purchasing may help you find out which particular machine works well for you. Your health insurance company may also decide which machine you may get. Keep the CPAP or BIPAP machine and attachments clean. Ask your health care provider for specific instructions. Check the humidifier if you have a dry stuffy nose or nosebleeds. Make sure it is working correctly. Follow these instructions at home: Take over-the-counter and prescription medicines only as told by your health care provider. Ask if you can take sinus medicine if your sinuses are blocked. Do not use any products that contain nicotine or tobacco. These products include cigarettes, chewing tobacco, and vaping devices, such as e-cigarettes. If you need help  quitting, ask your health care provider. Keep all follow-up visits. This is important. Contact a health care provider if: You have redness or pressure sores on your head, face, mouth, or nose from the mask or head gear. You have trouble using the CPAP or BIPAP machine. You cannot tolerate wearing the CPAP or BIPAP mask. Someone tells you that you snore even when wearing your CPAP or BIPAP. Get help right away if: You have trouble breathing. You feel confused. Summary CPAP and BIPAP are methods that use air pressure to keep your airways open and to help you breathe well. If you have trouble with the mask, it is very important that you talk with your health care provider about finding a way to make the mask easier to tolerate. Do not stop using the mask. There could be a negative impact to your health if you stop using the mask. Follow instructions from your health care provider about when to use the machine. This information is not  intended to replace advice given to you by your health care provider. Make sure you discuss any questions you have with your health care provider. Document Revised: 11/02/2020 Document Reviewed: 03/04/2020 Elsevier Patient Education  [employer].

## 2022-04-19 NOTE — Progress Notes (Signed)
Reviewed and agree with assessment/plan.   Chesley Mires, MD [facility] Pulmonary/Critical Care 04/19/2022, 8:21 AM Pager:  [phone]

## 2022-05-11 ENCOUNTER — Inpatient Hospital Stay: Payer: 59 | Attending: Internal Medicine

## 2022-05-11 VITALS — BP 127/77 | HR 81 | Temp 98.7°F | Resp 18

## 2022-05-11 DIAGNOSIS — D500 Iron deficiency anemia secondary to blood loss (chronic): Secondary | ICD-10-CM | POA: Insufficient documentation

## 2022-05-11 DIAGNOSIS — K922 Gastrointestinal hemorrhage, unspecified: Secondary | ICD-10-CM | POA: Diagnosis not present

## 2022-05-11 DIAGNOSIS — E611 Iron deficiency: Secondary | ICD-10-CM

## 2022-05-11 MED ORDER — SODIUM CHLORIDE 0.9 % IV SOLN
200.0000 mg | Freq: Once | INTRAVENOUS | Status: AC
  Administered 2022-05-11: 200 mg via INTRAVENOUS
  Filled 2022-05-11: qty 200

## 2022-05-11 MED ORDER — SODIUM CHLORIDE 0.9 % IV SOLN
Freq: Once | INTRAVENOUS | Status: AC
  Filled 2022-05-11: qty 250

## 2022-05-11 NOTE — Patient Instructions (Signed)

## 2022-05-17 ENCOUNTER — Ambulatory Visit: Payer: 59

## 2022-05-18 ENCOUNTER — Encounter: Payer: Self-pay | Admitting: Internal Medicine

## 2022-05-18 ENCOUNTER — Other Ambulatory Visit: Payer: Self-pay

## 2022-05-28 DIAGNOSIS — G4733 Obstructive sleep apnea (adult) (pediatric): Secondary | ICD-10-CM | POA: Diagnosis not present

## 2022-06-01 DIAGNOSIS — H04123 Dry eye syndrome of bilateral lacrimal glands: Secondary | ICD-10-CM | POA: Diagnosis not present

## 2022-06-01 DIAGNOSIS — H43812 Vitreous degeneration, left eye: Secondary | ICD-10-CM | POA: Diagnosis not present

## 2022-06-01 DIAGNOSIS — H353132 Nonexudative age-related macular degeneration, bilateral, intermediate dry stage: Secondary | ICD-10-CM | POA: Diagnosis not present

## 2022-06-01 DIAGNOSIS — H35411 Lattice degeneration of retina, right eye: Secondary | ICD-10-CM | POA: Diagnosis not present

## 2022-06-01 DIAGNOSIS — H35372 Puckering of macula, left eye: Secondary | ICD-10-CM | POA: Diagnosis not present

## 2022-06-01 DIAGNOSIS — H26492 Other secondary cataract, left eye: Secondary | ICD-10-CM | POA: Diagnosis not present

## 2022-06-11 ENCOUNTER — Other Ambulatory Visit: Payer: Self-pay

## 2022-06-11 DIAGNOSIS — M6282 Rhabdomyolysis: Secondary | ICD-10-CM

## 2022-06-12 ENCOUNTER — Telehealth: Payer: Self-pay

## 2022-06-12 ENCOUNTER — Other Ambulatory Visit: Payer: Self-pay

## 2022-06-12 ENCOUNTER — Encounter: Payer: Self-pay | Admitting: Internal Medicine

## 2022-06-12 LAB — URINALYSIS, COMPLETE
Bilirubin, UA: NEGATIVE
Glucose, UA: NEGATIVE
Ketones, UA: NEGATIVE
Leukocytes,UA: NEGATIVE
Nitrite, UA: NEGATIVE
Protein,UA: NEGATIVE
RBC, UA: NEGATIVE
Specific Gravity, UA: 1.023 (ref 1.005–1.030)
Urobilinogen, Ur: 1 mg/dL (ref 0.2–1.0)
pH, UA: 5.5 (ref 5.0–7.5)

## 2022-06-12 LAB — MICROSCOPIC EXAMINATION
Bacteria, UA: NONE SEEN
Casts: NONE SEEN /lpf
Epithelial Cells (non renal): NONE SEEN /hpf (ref 0–10)
WBC, UA: NONE SEEN /hpf (ref 0–5)

## 2022-06-12 LAB — CK: Total CK: 90 U/L (ref 41–331)

## 2022-06-12 LAB — C-REACTIVE PROTEIN: CRP: <1 mg/L (ref 0–10)

## 2022-06-12 NOTE — Telephone Encounter (Signed)
Advised pt of lab results./sh

## 2022-06-12 NOTE — Telephone Encounter (Signed)
-----   Message from Brendolyn Patty, MD sent at 06/12/2022  1:19 PM EST ----- Labs all normal - please call patient

## 2022-06-13 ENCOUNTER — Other Ambulatory Visit: Payer: Self-pay

## 2022-06-14 ENCOUNTER — Other Ambulatory Visit: Payer: Self-pay

## 2022-06-14 ENCOUNTER — Other Ambulatory Visit: Payer: Self-pay | Admitting: *Deleted

## 2022-06-14 DIAGNOSIS — E611 Iron deficiency: Secondary | ICD-10-CM

## 2022-06-15 ENCOUNTER — Inpatient Hospital Stay: Payer: 59

## 2022-06-15 ENCOUNTER — Other Ambulatory Visit: Payer: Self-pay

## 2022-06-15 ENCOUNTER — Inpatient Hospital Stay (HOSPITAL_BASED_OUTPATIENT_CLINIC_OR_DEPARTMENT_OTHER): Payer: 59 | Admitting: Internal Medicine

## 2022-06-15 ENCOUNTER — Encounter: Payer: Self-pay | Admitting: Internal Medicine

## 2022-06-15 ENCOUNTER — Inpatient Hospital Stay: Payer: 59 | Attending: Internal Medicine

## 2022-06-15 VITALS — BP 132/79 | HR 75 | Resp 16

## 2022-06-15 VITALS — BP 120/75 | HR 80 | Temp 97.0°F | Resp 15 | Wt 207.8 lb

## 2022-06-15 DIAGNOSIS — E611 Iron deficiency: Secondary | ICD-10-CM | POA: Diagnosis not present

## 2022-06-15 DIAGNOSIS — D509 Iron deficiency anemia, unspecified: Secondary | ICD-10-CM | POA: Diagnosis not present

## 2022-06-15 LAB — IRON AND TIBC
Iron: 103 ug/dL (ref 45–182)
Saturation Ratios: 33 % (ref 17.9–39.5)
TIBC: 314 ug/dL (ref 250–450)
UIBC: 211 ug/dL

## 2022-06-15 LAB — CBC WITH DIFFERENTIAL/PLATELET
Abs Immature Granulocytes: 0 10*3/uL (ref 0.00–0.07)
Basophils Absolute: 0.1 10*3/uL (ref 0.0–0.1)
Basophils Relative: 1 %
Eosinophils Absolute: 0.2 10*3/uL (ref 0.0–0.5)
Eosinophils Relative: 5 %
HCT: 36.9 % — ABNORMAL LOW (ref 39.0–52.0)
Hemoglobin: 12.8 g/dL — ABNORMAL LOW (ref 13.0–17.0)
Immature Granulocytes: 0 %
Lymphocytes Relative: 32 %
Lymphs Abs: 1.3 10*3/uL (ref 0.7–4.0)
MCH: 29.8 pg (ref 26.0–34.0)
MCHC: 34.7 g/dL (ref 30.0–36.0)
MCV: 86 fL (ref 80.0–100.0)
Monocytes Absolute: 0.4 10*3/uL (ref 0.1–1.0)
Monocytes Relative: 10 %
Neutro Abs: 2.1 10*3/uL (ref 1.7–7.7)
Neutrophils Relative %: 52 %
Platelets: 188 10*3/uL (ref 150–400)
RBC: 4.29 MIL/uL (ref 4.22–5.81)
RDW: 13.2 % (ref 11.5–15.5)
WBC: 4.1 10*3/uL (ref 4.0–10.5)
nRBC: 0 % (ref 0.0–0.2)

## 2022-06-15 LAB — BASIC METABOLIC PANEL - CANCER CENTER ONLY
Anion gap: 7 (ref 5–15)
BUN: 21 mg/dL (ref 8–23)
CO2: 26 mmol/L (ref 22–32)
Calcium: 8.5 mg/dL — ABNORMAL LOW (ref 8.9–10.3)
Chloride: 105 mmol/L (ref 98–111)
Creatinine: 1.14 mg/dL (ref 0.61–1.24)
GFR, Estimated: >60 mL/min (ref >60)
Glucose, Bld: 121 mg/dL — ABNORMAL HIGH (ref 70–99)
Potassium: 3.8 mmol/L (ref 3.5–5.1)
Sodium: 138 mmol/L (ref 135–145)

## 2022-06-15 LAB — FERRITIN: Ferritin: 107 ng/mL (ref 24–336)

## 2022-06-15 MED ORDER — SODIUM CHLORIDE 0.9 % IV SOLN
200.0000 mg | Freq: Once | INTRAVENOUS | Status: AC
  Administered 2022-06-15: 200 mg via INTRAVENOUS
  Filled 2022-06-15: qty 200

## 2022-06-15 MED ORDER — SODIUM CHLORIDE 0.9 % IV SOLN
Freq: Once | INTRAVENOUS | Status: AC
  Filled 2022-06-15: qty 250

## 2022-06-15 NOTE — Progress Notes (Signed)
Matthew Kelley CONSULT NOTE  Patient Care Team: Einar Pheasant, MD as PCP - General (Internal Medicine) Einar Pheasant, MD (Internal Medicine) Cammie Sickle, MD as Consulting Physician (Oncology)  CHIEF COMPLAINTS/PURPOSE OF CONSULTATION: ANEMIA   HEMATOLOGY HISTORY:  # ANEMIA capsule- AVMs- actively bleeding [sep [id number]; EGD-; colonoscopy-FEB, 2020 < 2007- EGD-stretching of esophagus. prior history of GI bleed/AVMs more than 15 years ago. April/MAY 2023- GI balloon double enteroscopy/ May 2023 -capsule study-.study was unremarkable.   # CAD [on asprin 81 mg/day s/p stenting; off brillinta]  HISTORY OF PRESENTING ILLNESS: Ambulating independently.  Alone.  Matthew Kelley 62 y.o.  male with symptomatic iron deficiency [with poor response to p.o. iron] is here for follow-up.  Patient currently on iron infusion once a month.  Patient is just on aspirin.  Patient notes his energy level is improved but not back to normal.  No further episodes of GI bleed.  Review of Systems  Constitutional:  Positive for malaise/fatigue. Negative for chills, diaphoresis, fever and weight loss.  HENT:  Negative for nosebleeds and sore throat.   Eyes:  Negative for double vision.  Respiratory:  Negative for cough, hemoptysis, sputum production, shortness of breath and wheezing.   Cardiovascular:  Negative for chest pain, palpitations, orthopnea and leg swelling.  Gastrointestinal:  Negative for abdominal pain, blood in stool, constipation, diarrhea, heartburn, melena, nausea and vomiting.  Genitourinary:  Negative for dysuria, frequency and urgency.  Musculoskeletal:  Negative for back pain and joint pain.  Skin: Negative.  Negative for itching and rash.  Neurological:  Negative for dizziness, tingling, focal weakness, weakness and headaches.  Endo/Heme/Allergies:  Does not bruise/bleed easily.  Psychiatric/Behavioral:  Negative for depression. The patient is not nervous/anxious and  does not have insomnia.     MEDICAL HISTORY:  Past Medical History:  Diagnosis Date   Atypical nevus 09/07/2008   Left scapula, lateral. Moderate atypia.   Blood in stool    h/o   Cancer ([place])    thyroid cancer   Dysplastic nevus 12/20/2009   Spinal lower back. Moderate atypia, edges free   Dysplastic nevus 12/20/2009   Left paraspinal lower back. Mild atypia, edges free.   Dysplastic nevus 11/24/2018   Right lower back. Mild atypia, deep margin involved.   Dysplastic nevus 11/24/2018   Right mid back. Moderate atypia and halo nevus effect. Limited margins free.   H/O total thyroidectomy    History of chicken pox    Hypercholesterolemia    Hypothyroidism    Sleep apnea    Thyroid cancer ([place])    Vertigo     SURGICAL HISTORY: Past Surgical History:  Procedure Laterality Date   CHOLECYSTECTOMY  2000   CORONARY/GRAFT ACUTE MI REVASCULARIZATION N/A 06/16/2020   Procedure: Coronary/Graft Acute MI Revascularization;  Surgeon: [name], MD;  Location: [facility];  Service: Cardiovascular;  Laterality: N/A;   LEFT HEART CATH AND CORONARY ANGIOGRAPHY N/A 06/16/2020   Procedure: LEFT HEART CATH AND CORONARY ANGIOGRAPHY;  Surgeon: [name], MD;  Location: [facility];  Service: Cardiovascular;  Laterality: N/A;   TOTAL THYROIDECTOMY  2009    SOCIAL HISTORY: Social History   Socioeconomic History   Marital status: Married    Spouse name: Not on file   Number of children: Not on file   Years of education: Not on file   Highest education level: Not on file  Occupational History   Not on file  Tobacco Use   Smoking status: Never  Smokeless tobacco: Never  Vaping Use   Vaping Use: Never used  Substance and Sexual Activity   Alcohol use: Yes    Alcohol/week: 0.0 standard drinks of alcohol   Drug use: No   Sexual activity: Not on file  Other Topics Concern   Not on file  Social History Narrative   ** Merged History Encounter **        Social Determinants of Health   Financial Resource Strain: Not on file  Food Insecurity: Not on file  Transportation Needs: Not on file  Physical Activity: Not on file  Stress: Not on file  Social Connections: Not on file  Intimate Partner Violence: Not on file    FAMILY HISTORY: Family History  Problem Relation Age of Onset   Hyperlipidemia Father    Heart disease Father     ALLERGIES:  has No Known Allergies.  MEDICATIONS:  Current Outpatient Medications  Medication Sig Dispense Refill   Alirocumab (PRALUENT) 150 MG/ML SOAJ Inject 150 mg subcutaneously every 14 (fourteen) days 6 mL 4   aspirin 81 MG chewable tablet Chew 1 tablet (81 mg total) by mouth daily. 30 tablet 2   cholecalciferol (VITAMIN D) 1000 units tablet Take 1 tablet (1,000 Units total) by mouth daily. 90 tablet 1   cycloSPORINE (RESTASIS) 0.05 % ophthalmic emulsion PLACE ONE DROP IN BOTH EYES TWICE A DAY 180 each 3   finasteride (PROPECIA) 1 MG tablet TAKE 1 TABLET BY MOUTH ONCE DAILY 90 tablet 4   ketoconazole (NIZORAL) 2 % shampoo Apply 1 application topically 2 (two) times a week. 360 mL 3   levothyroxine (SYNTHROID) 175 MCG tablet Take 175 mcg by mouth daily before breakfast.     minoxidil (LONITEN) 2.5 MG tablet Take 2 tablets (5 mg total) by mouth daily. 180 tablet 3   Multiple Vitamins-Minerals (PRESERVISION AREDS 2) CAPS      permethrin (ELIMITE) 5 % cream Apply 1 Application topically as directed. 120 g 1   Ruxolitinib Phosphate (OPZELURA) 1.5 % CREA Apply 1 application  topically daily. 180 g 3   Alirocumab (PRALUENT) 150 MG/ML SOAJ Inject 150 mg into the skin every 14 (fourteen) days. (Patient not taking: Reported on 06/15/2022) 6 mL 4   fluvastatin XL (LESCOL XL) 80 MG 24 hr tablet Take 1 tablet (80 mg total) by mouth once daily (Patient not taking: Reported on 06/15/2022) 90 tablet 4   pantoprazole (PROTONIX) 40 MG tablet Take 1 tablet (40 mg total) by mouth once daily (Patient not taking:  Reported on 06/15/2022) 30 tablet 3   No current facility-administered medications for this visit.      PHYSICAL EXAMINATION:   Vitals:   06/15/22 1506  BP: 120/75  Pulse: 80  Resp: 15  Temp: (!) 97 F (36.1 C)  SpO2: 100%   Filed Weights   06/15/22 1506  Weight: 207 lb 12.8 oz (94.3 kg)    Physical Exam Vitals and nursing note reviewed.  HENT:     Head: Normocephalic and atraumatic.     Mouth/Throat:     Pharynx: Oropharynx is clear.  Eyes:     Extraocular Movements: Extraocular movements intact.     Pupils: Pupils are equal, round, and reactive to light.  Cardiovascular:     Rate and Rhythm: Normal rate and regular rhythm.  Abdominal:     Palpations: Abdomen is soft.  Musculoskeletal:        General: Normal range of motion.     Cervical back: Normal range of  motion.  Skin:    General: Skin is warm.  Neurological:     General: No focal deficit present.     Mental Status: He is alert and oriented to person, place, and time.  Psychiatric:        Behavior: Behavior normal.        Judgment: Judgment normal.    LABORATORY DATA:  I have reviewed the data as listed Lab Results  Component Value Date   WBC 4.1 06/15/2022   HGB 12.8 (L) 06/15/2022   HCT 36.9 (L) 06/15/2022   MCV 86.0 06/15/2022   PLT 188 06/15/2022   Recent Labs    12/15/21 1440 02/16/22 1444 03/23/22 0738 06/15/22 1441  NA 141 137 141 138  K 3.8 3.7 3.8 3.8  CL 108 101 105 105  CO2 '27 27 28 26  '$ GLUCOSE 101* 128* 103* 121*  BUN '20 20 22 21  '$ CREATININE 1.13 1.20 1.11 1.14  CALCIUM 8.7* 8.6* 8.7 8.5*  GFRNONAA >60 >60  --  >60  PROT  --  7.1 6.4  --   ALBUMIN  --  4.3 4.2  --   AST  --  25 16  --   ALT  --  33 18  --   ALKPHOS  --  76 65  --   BILITOT  --  0.5 0.8  --   BILIDIR  --   --  0.2  --      No results found.  Iron deficiency # Anemia- iron deficiency-likely secondary to occult GI bleed [see below].  S/p Venofer.  #Patient currently getting Venofer on a monthly  basis.  Hemoglobin today is 12.8.  The etiology is unclear-check PNH in 2 months/next lab draw.  Again, low clinical suspicion of PNH.  Continue every month-- Venofer.  Labs every other month.  #Etiology: Iron deficiency. unclear likely occult/ secondary to chronic GI bleeding/AVMs-s/p capsule study/ double balloon enteroscopy/capsule [April/MAY 2023]-do not suspect any bone marrow/PNH.  Status post referral to [facility]- EGD/[facility]- OCT-2023- WNL- stable.   # CAD - [off Brillinta- dec 2022; on asprin 81 mg/day]-STABLE.   # DISPOSITION: fridays # Venofer today # Every month- Venofer- # Labs- cbc-  every OTHER month- order; * PNH in 2 months-ordered.  # follow up in 6 months- MD; labs- cbc/bmp; iron studies/ferritin-possible venofer- Dr.B    All questions were answered. The patient knows to call the clinic with any problems, questions or concerns.   Cammie Sickle, MD 06/15/2022 4:20 PM

## 2022-06-15 NOTE — Assessment & Plan Note (Addendum)
#   Anemia- iron deficiency-likely secondary to occult GI bleed [see below].  S/p Venofer.  #Patient currently getting Venofer on a monthly basis.  Hemoglobin today is 12.8.  The etiology is unclear-check PNH in 2 months/next lab draw.  Again, low clinical suspicion of PNH.  Continue every month-- Venofer.  Labs every other month.  #Etiology: Iron deficiency. unclear likely occult/ secondary to chronic GI bleeding/AVMs-s/p capsule study/ double balloon enteroscopy/capsule [April/MAY 2023]-do not suspect any bone marrow/PNH.  Status post referral to [facility]- EGD/[facility]- OCT-2023- WNL- stable.   # CAD - [off Brillinta- dec 2022; on asprin 81 mg/day]-STABLE.   # DISPOSITION: fridays # Venofer today # Every month- Venofer- # Labs- cbc-  every OTHER month- order; * PNH in 2 months-ordered.  # follow up in 6 months- MD; labs- cbc/bmp; iron studies/ferritin-possible venofer- Dr.B

## 2022-06-15 NOTE — Progress Notes (Signed)
No additional concerns today.

## 2022-06-17 ENCOUNTER — Encounter: Payer: Self-pay | Admitting: Internal Medicine

## 2022-06-18 ENCOUNTER — Other Ambulatory Visit: Payer: Self-pay

## 2022-06-19 ENCOUNTER — Encounter: Payer: Self-pay | Admitting: Internal Medicine

## 2022-06-19 ENCOUNTER — Other Ambulatory Visit: Payer: Self-pay

## 2022-06-20 DIAGNOSIS — G4733 Obstructive sleep apnea (adult) (pediatric): Secondary | ICD-10-CM | POA: Diagnosis not present

## 2022-06-22 ENCOUNTER — Other Ambulatory Visit: Payer: Self-pay

## 2022-06-25 ENCOUNTER — Other Ambulatory Visit: Payer: Self-pay

## 2022-06-26 DIAGNOSIS — G4733 Obstructive sleep apnea (adult) (pediatric): Secondary | ICD-10-CM | POA: Diagnosis not present

## 2022-06-27 ENCOUNTER — Other Ambulatory Visit: Payer: Self-pay

## 2022-06-27 MED ORDER — PRALUENT 150 MG/ML ~~LOC~~ SOAJ
150.0000 mg | SUBCUTANEOUS | 4 refills | Status: DC
  Filled 2022-06-27 – 2022-10-05 (×2): qty 6, 84d supply, fill #0
  Filled 2022-12-06: qty 6, 84d supply, fill #1

## 2022-06-29 ENCOUNTER — Ambulatory Visit: Payer: 59 | Admitting: Internal Medicine

## 2022-07-16 ENCOUNTER — Other Ambulatory Visit: Payer: Self-pay

## 2022-07-16 MED ORDER — DOXYCYCLINE MONOHYDRATE 100 MG PO CAPS
100.0000 mg | ORAL_CAPSULE | Freq: Two times a day (BID) | ORAL | 1 refills | Status: DC
  Filled 2022-07-16: qty 60, 30d supply, fill #0
  Filled 2022-08-24: qty 60, 30d supply, fill #1

## 2022-07-16 NOTE — Progress Notes (Signed)
Doxycycline bid with food and drink

## 2022-07-20 ENCOUNTER — Ambulatory Visit: Payer: 59 | Admitting: Internal Medicine

## 2022-07-20 ENCOUNTER — Encounter: Payer: Self-pay | Admitting: Internal Medicine

## 2022-07-20 ENCOUNTER — Inpatient Hospital Stay: Payer: 59 | Attending: Internal Medicine

## 2022-07-20 ENCOUNTER — Other Ambulatory Visit: Payer: Self-pay

## 2022-07-20 VITALS — BP 118/72 | HR 75 | Temp 98.0°F | Resp 16 | Ht 73.0 in | Wt 202.0 lb

## 2022-07-20 VITALS — BP 112/75 | HR 78 | Temp 98.2°F

## 2022-07-20 DIAGNOSIS — D509 Iron deficiency anemia, unspecified: Secondary | ICD-10-CM | POA: Diagnosis not present

## 2022-07-20 DIAGNOSIS — W57XXXA Bitten or stung by nonvenomous insect and other nonvenomous arthropods, initial encounter: Secondary | ICD-10-CM | POA: Diagnosis not present

## 2022-07-20 DIAGNOSIS — S70362A Insect bite (nonvenomous), left thigh, initial encounter: Secondary | ICD-10-CM | POA: Diagnosis not present

## 2022-07-20 DIAGNOSIS — E7800 Pure hypercholesterolemia, unspecified: Secondary | ICD-10-CM | POA: Diagnosis not present

## 2022-07-20 DIAGNOSIS — Z85850 Personal history of malignant neoplasm of thyroid: Secondary | ICD-10-CM | POA: Diagnosis not present

## 2022-07-20 DIAGNOSIS — Z961 Presence of intraocular lens: Secondary | ICD-10-CM | POA: Diagnosis not present

## 2022-07-20 DIAGNOSIS — G4733 Obstructive sleep apnea (adult) (pediatric): Secondary | ICD-10-CM

## 2022-07-20 DIAGNOSIS — Z0000 Encounter for general adult medical examination without abnormal findings: Secondary | ICD-10-CM

## 2022-07-20 DIAGNOSIS — H353131 Nonexudative age-related macular degeneration, bilateral, early dry stage: Secondary | ICD-10-CM | POA: Diagnosis not present

## 2022-07-20 DIAGNOSIS — H26492 Other secondary cataract, left eye: Secondary | ICD-10-CM | POA: Diagnosis not present

## 2022-07-20 DIAGNOSIS — E611 Iron deficiency: Secondary | ICD-10-CM

## 2022-07-20 DIAGNOSIS — I25119 Atherosclerotic heart disease of native coronary artery with unspecified angina pectoris: Secondary | ICD-10-CM

## 2022-07-20 DIAGNOSIS — Z23 Encounter for immunization: Secondary | ICD-10-CM

## 2022-07-20 DIAGNOSIS — H35372 Puckering of macula, left eye: Secondary | ICD-10-CM | POA: Diagnosis not present

## 2022-07-20 LAB — LIPID PANEL
Cholesterol: 118 mg/dL (ref 0–200)
HDL: 42.8 mg/dL (ref >39.00)
LDL Cholesterol: 54 mg/dL (ref 0–99)
NonHDL: 75.57
Total CHOL/HDL Ratio: 3
Triglycerides: 106 mg/dL (ref 0.0–149.0)
VLDL: 21.2 mg/dL (ref 0.0–40.0)

## 2022-07-20 LAB — BASIC METABOLIC PANEL
BUN: 18 mg/dL (ref 6–23)
CO2: 28 mEq/L (ref 19–32)
Calcium: 8.9 mg/dL (ref 8.4–10.5)
Chloride: 105 mEq/L (ref 96–112)
Creatinine, Ser: 1.09 mg/dL (ref 0.40–1.50)
GFR: 72.06 mL/min (ref >60.00)
Glucose, Bld: 99 mg/dL (ref 70–99)
Potassium: 3.9 mEq/L (ref 3.5–5.1)
Sodium: 142 mEq/L (ref 135–145)

## 2022-07-20 LAB — HEPATIC FUNCTION PANEL
ALT: 15 U/L (ref 0–53)
AST: 14 U/L (ref 0–37)
Albumin: 4.4 g/dL (ref 3.5–5.2)
Alkaline Phosphatase: 74 U/L (ref 39–117)
Bilirubin, Direct: 0.1 mg/dL (ref 0.0–0.3)
Total Bilirubin: 0.6 mg/dL (ref 0.2–1.2)
Total Protein: 6.6 g/dL (ref 6.0–8.3)

## 2022-07-20 LAB — T4, FREE: Free T4: 1.32 ng/dL (ref 0.60–1.60)

## 2022-07-20 LAB — TSH: TSH: 0.37 u[IU]/mL (ref 0.35–5.50)

## 2022-07-20 MED ORDER — PREDNISOLONE ACETATE 1 % OP SUSP
1.0000 [drp] | Freq: Four times a day (QID) | OPHTHALMIC | 0 refills | Status: DC
  Filled 2022-07-20: qty 5, 25d supply, fill #0

## 2022-07-20 MED ORDER — SODIUM CHLORIDE 0.9 % IV SOLN
200.0000 mg | Freq: Once | INTRAVENOUS | Status: AC
  Administered 2022-07-20: 200 mg via INTRAVENOUS
  Filled 2022-07-20: qty 200

## 2022-07-20 MED ORDER — SODIUM CHLORIDE 0.9 % IV SOLN
Freq: Once | INTRAVENOUS | Status: AC
  Filled 2022-07-20: qty 250

## 2022-07-20 MED ORDER — CYCLOSPORINE 0.05 % OP EMUL
1.0000 [drp] | Freq: Two times a day (BID) | OPHTHALMIC | 3 refills | Status: DC
  Filled 2022-07-20: qty 180, 90d supply, fill #0
  Filled 2022-10-17: qty 180, 90d supply, fill #1
  Filled 2023-01-12: qty 180, 90d supply, fill #2

## 2022-07-20 NOTE — Progress Notes (Unsigned)
Subjective:    Patient ID: Matthew Kelley, male    DOB: [birth year], 62 y.o.   MRN: [id number]  Patient here for  Chief Complaint  Patient presents with   Medical Management of Chronic Issues    HPI Here for a physical exam. Seeing Dr Donneta Romberg for f/u anemia.  Receiving IV venofer. Also seeing pulmonary - f/u sleep apnea.  Using cpap nightly.  Recently setting changed - auto cpap.  History of anterior ATEMI s/p PCI/DES of proximal LAD.  Continues on aspirin.  Colonoscopy and EGD 01/2022 - ?  Past Medical History:  Diagnosis Date   Atypical nevus 09/07/2008   Left scapula, lateral. Moderate atypia.   Blood in stool    h/o   Cancer    thyroid cancer   Dysplastic nevus 12/20/2009   Spinal lower back. Moderate atypia, edges free   Dysplastic nevus 12/20/2009   Left paraspinal lower back. Mild atypia, edges free.   Dysplastic nevus 11/24/2018   Right lower back. Mild atypia, deep margin involved.   Dysplastic nevus 11/24/2018   Right mid back. Moderate atypia and halo nevus effect. Limited margins free.   H/O total thyroidectomy    History of chicken pox    Hypercholesterolemia    Hypothyroidism    Sleep apnea    Thyroid cancer    Vertigo    Past Surgical History:  Procedure Laterality Date   CHOLECYSTECTOMY  2000   CORONARY/GRAFT ACUTE MI REVASCULARIZATION N/A 06/16/2020   Procedure: Coronary/Graft Acute MI Revascularization;  Surgeon: Marcina Millard, MD;  Location: [facility] INVASIVE CV LAB;  Service: Cardiovascular;  Laterality: N/A;   LEFT HEART CATH AND CORONARY ANGIOGRAPHY N/A 06/16/2020   Procedure: LEFT HEART CATH AND CORONARY ANGIOGRAPHY;  Surgeon: Marcina Millard, MD;  Location: [facility] INVASIVE CV LAB;  Service: Cardiovascular;  Laterality: N/A;   TOTAL THYROIDECTOMY  2009   Family History  Problem Relation Age of Onset   Hyperlipidemia Father    Heart disease Father    Social History   Socioeconomic History   Marital status: Married    Spouse name:  Not on file   Number of children: Not on file   Years of education: Not on file   Highest education level: Not on file  Occupational History   Not on file  Tobacco Use   Smoking status: Never   Smokeless tobacco: Never  Vaping Use   Vaping Use: Never used  Substance and Sexual Activity   Alcohol use: Yes    Alcohol/week: 0.0 standard drinks of alcohol   Drug use: No   Sexual activity: Not on file  Other Topics Concern   Not on file  Social History Narrative   ** Merged History Encounter **       Social Determinants of Health   Financial Resource Strain: Not on file  Food Insecurity: Not on file  Transportation Needs: Not on file  Physical Activity: Not on file  Stress: Not on file  Social Connections: Not on file     Review of Systems     Objective:     BP 118/72   Pulse 75   Temp 98 F (36.7 C)   Resp 16   Ht  (1.854 m)   Wt 202 lb (91.6 kg)   SpO2 98%   BMI 26.65 kg/m  Wt Readings from Last 3 Encounters:  07/20/22 202 lb (91.6 kg)  06/15/22 207 lb 12.8 oz (94.3 kg)  04/18/22 204 lb 6.4 oz (92.7 kg)  Physical Exam   Outpatient Encounter Medications as of 07/20/2022  Medication Sig   doxycycline (MONODOX) 100 MG capsule Take 1 capsule (100 mg total) by mouth 2 (two) times daily. Take with food and drink   Alirocumab (PRALUENT) 150 MG/ML SOAJ Inject 150 mg into the skin every 14 (fourteen) days. (Patient not taking: Reported on 06/15/2022)   Alirocumab (PRALUENT) 150 MG/ML SOAJ Inject 150 mg into the skin every 14 (fourteen) days.   aspirin 81 MG chewable tablet Chew 1 tablet (81 mg total) by mouth daily.   Cholecalciferol (VITAMIN D) 50 MCG (2000 UT) tablet Take 2,000 Units by mouth daily.   cycloSPORINE (RESTASIS) 0.05 % ophthalmic emulsion PLACE ONE DROP IN BOTH EYES TWICE A DAY   finasteride (PROPECIA) 1 MG tablet TAKE 1 TABLET BY MOUTH ONCE DAILY   fluvastatin XL (LESCOL XL) 80 MG 24 hr tablet Take 1 tablet (80 mg total) by mouth once daily  (Patient not taking: Reported on 06/15/2022)   ketoconazole (NIZORAL) 2 % shampoo Apply 1 application topically 2 (two) times a week.   minoxidil (LONITEN) 2.5 MG tablet Take 2 tablets (5 mg total) by mouth daily.   Multiple Vitamins-Minerals (PRESERVISION AREDS 2) CAPS    permethrin (ELIMITE) 5 % cream Apply 1 Application topically as directed.   Ruxolitinib Phosphate (OPZELURA) 1.5 % CREA Apply 1 application  topically daily.   [DISCONTINUED] Alirocumab (PRALUENT) 150 MG/ML SOAJ Inject 150 mg subcutaneously every 14 (fourteen) days   [DISCONTINUED] cholecalciferol (VITAMIN D) 1000 units tablet Take 1 tablet (1,000 Units total) by mouth daily.   [DISCONTINUED] levothyroxine (SYNTHROID) 175 MCG tablet Take 175 mcg by mouth daily before breakfast.   [DISCONTINUED] pantoprazole (PROTONIX) 40 MG tablet Take 1 tablet (40 mg total) by mouth once daily (Patient not taking: Reported on 06/15/2022)   No facility-administered encounter medications on file as of 07/20/2022.     Lab Results  Component Value Date   WBC 4.1 06/15/2022   HGB 12.8 (L) 06/15/2022   HCT 36.9 (L) 06/15/2022   PLT 188 06/15/2022   GLUCOSE 121 (H) 06/15/2022   CHOL 172 03/23/2022   TRIG 90.0 03/23/2022   HDL 44.70 03/23/2022   LDLCALC 109 (H) 03/23/2022   ALT 18 03/23/2022   AST 16 03/23/2022   NA 138 06/15/2022   K 3.8 06/15/2022   CL 105 06/15/2022   CREATININE 1.14 06/15/2022   BUN 21 06/15/2022   CO2 26 06/15/2022   TSH 1.01 03/23/2022   PSA 0.13 03/23/2022   INR 1.0 06/16/2020   HGBA1C 5.7 09/04/2019    No results found.     Assessment & Plan:  Hypercholesterolemia  Personal history of malignant neoplasm of thyroid     Dale Gifford, MD

## 2022-07-22 ENCOUNTER — Encounter: Payer: Self-pay | Admitting: Internal Medicine

## 2022-07-22 NOTE — Assessment & Plan Note (Signed)
On praluent.  Recently started back on lescol.  Low cholesterol diet and exercise.  Follow lipid panel. Check labs.

## 2022-07-22 NOTE — Assessment & Plan Note (Signed)
Followed by Dr South. On synthroid.  Follow tsh.  

## 2022-07-22 NOTE — Assessment & Plan Note (Signed)
Has been receiving iron infusions.  Due today.  Continue f/u with Dr Donneta Romberg.

## 2022-07-22 NOTE — Assessment & Plan Note (Signed)
No current headache, fever or rash.  Knee is better. Complete doxycycline.  Requested lyme titers.

## 2022-07-22 NOTE — Assessment & Plan Note (Signed)
CPAP.  

## 2022-07-22 NOTE — Assessment & Plan Note (Signed)
History of anterior STEMI s/p PCI/DES of proximal LAD.  Continue aspirin and praluent. Recent stress test ok.  No chest pain now.  Follow.  

## 2022-07-23 ENCOUNTER — Other Ambulatory Visit: Payer: Self-pay

## 2022-07-23 LAB — LYME DISEASE SEROLOGY W/REFLEX: Lyme Total Antibody EIA: NEGATIVE

## 2022-08-02 ENCOUNTER — Other Ambulatory Visit: Payer: Self-pay

## 2022-08-14 ENCOUNTER — Other Ambulatory Visit: Payer: Self-pay | Admitting: Dermatology

## 2022-08-14 ENCOUNTER — Other Ambulatory Visit: Payer: Self-pay

## 2022-08-14 MED ORDER — MINOXIDIL 2.5 MG PO TABS
5.0000 mg | ORAL_TABLET | Freq: Every day | ORAL | 3 refills | Status: DC
  Filled 2022-08-24: qty 180, 90d supply, fill #0
  Filled 2022-11-19: qty 180, 90d supply, fill #1
  Filled 2023-02-15: qty 180, 90d supply, fill #2

## 2022-08-15 ENCOUNTER — Other Ambulatory Visit: Payer: Self-pay

## 2022-08-17 ENCOUNTER — Inpatient Hospital Stay: Payer: 59

## 2022-08-17 ENCOUNTER — Other Ambulatory Visit: Payer: Self-pay

## 2022-08-17 ENCOUNTER — Other Ambulatory Visit: Payer: 59

## 2022-08-23 ENCOUNTER — Other Ambulatory Visit: Payer: Self-pay

## 2022-08-23 DIAGNOSIS — E611 Iron deficiency: Secondary | ICD-10-CM

## 2022-08-24 ENCOUNTER — Other Ambulatory Visit: Payer: Self-pay

## 2022-08-24 ENCOUNTER — Inpatient Hospital Stay: Payer: 59 | Attending: Internal Medicine

## 2022-08-24 ENCOUNTER — Encounter: Payer: Self-pay | Admitting: Internal Medicine

## 2022-08-24 ENCOUNTER — Inpatient Hospital Stay: Payer: 59

## 2022-08-24 VITALS — BP 125/62 | HR 81 | Temp 97.6°F | Resp 17

## 2022-08-24 DIAGNOSIS — D509 Iron deficiency anemia, unspecified: Secondary | ICD-10-CM | POA: Insufficient documentation

## 2022-08-24 DIAGNOSIS — E611 Iron deficiency: Secondary | ICD-10-CM

## 2022-08-24 LAB — CBC WITH DIFFERENTIAL (CANCER CENTER ONLY)
Abs Immature Granulocytes: 0.03 10*3/uL (ref 0.00–0.07)
Basophils Absolute: 0.1 10*3/uL (ref 0.0–0.1)
Basophils Relative: 1 %
Eosinophils Absolute: 0.2 10*3/uL (ref 0.0–0.5)
Eosinophils Relative: 4 %
HCT: 39.6 % (ref 39.0–52.0)
Hemoglobin: 13.5 g/dL (ref 13.0–17.0)
Immature Granulocytes: 1 %
Lymphocytes Relative: 34 %
Lymphs Abs: 1.4 10*3/uL (ref 0.7–4.0)
MCH: 29.3 pg (ref 26.0–34.0)
MCHC: 34.1 g/dL (ref 30.0–36.0)
MCV: 85.9 fL (ref 80.0–100.0)
Monocytes Absolute: 0.4 10*3/uL (ref 0.1–1.0)
Monocytes Relative: 9 %
Neutro Abs: 2.2 10*3/uL (ref 1.7–7.7)
Neutrophils Relative %: 51 %
Platelet Count: 210 10*3/uL (ref 150–400)
RBC: 4.61 MIL/uL (ref 4.22–5.81)
RDW: 13.5 % (ref 11.5–15.5)
WBC Count: 4.2 10*3/uL (ref 4.0–10.5)
nRBC: 0 % (ref 0.0–0.2)

## 2022-08-24 MED ORDER — SODIUM CHLORIDE 0.9 % IV SOLN
200.0000 mg | Freq: Once | INTRAVENOUS | Status: AC
  Administered 2022-08-24: 200 mg via INTRAVENOUS
  Filled 2022-08-24: qty 200

## 2022-08-24 MED ORDER — SODIUM CHLORIDE 0.9 % IV SOLN
Freq: Once | INTRAVENOUS | Status: AC
  Filled 2022-08-24: qty 250

## 2022-08-24 NOTE — Patient Instructions (Signed)
[facility]  Discharge Instructions: Thank you for choosing [facility] to provide your oncology and hematology care.  If you have a lab appointment with the Cancer Center, please go directly to the Cancer Center and check in at the registration area.  Wear comfortable clothing and clothing appropriate for easy access to any Portacath or PICC line.   We strive to give you quality time with your provider. You may need to reschedule your appointment if you arrive late (15 or more minutes).  Arriving late affects you and other patients whose appointments are after yours.  Also, if you miss three or more appointments without notifying the office, you may be dismissed from the clinic at the provider's discretion.      For prescription refill requests, have your pharmacy contact our office and allow 72 hours for refills to be completed.    Today you received the following chemotherapy and/or immunotherapy agents Venofer.      To help prevent nausea and vomiting after your treatment, we encourage you to take your nausea medication as directed.  BELOW ARE SYMPTOMS THAT SHOULD BE REPORTED IMMEDIATELY: *FEVER GREATER THAN 100.4 F (38 C) OR HIGHER *CHILLS OR SWEATING *NAUSEA AND VOMITING THAT IS NOT CONTROLLED WITH YOUR NAUSEA MEDICATION *UNUSUAL SHORTNESS OF BREATH *UNUSUAL BRUISING OR BLEEDING *URINARY PROBLEMS (pain or burning when urinating, or frequent urination) *BOWEL PROBLEMS (unusual diarrhea, constipation, pain near the anus) TENDERNESS IN MOUTH AND THROAT WITH OR WITHOUT PRESENCE OF ULCERS (sore throat, sores in mouth, or a toothache) UNUSUAL RASH, SWELLING OR PAIN  UNUSUAL VAGINAL DISCHARGE OR ITCHING   Items with * indicate a potential emergency and should be followed up as soon as possible or go to the Emergency Department if any problems should occur.  Please show the CHEMOTHERAPY ALERT CARD or IMMUNOTHERAPY ALERT CARD at check-in to  the Emergency Department and triage nurse.  Should you have questions after your visit or need to cancel or reschedule your appointment, please contact [facility]  [phone] and follow the prompts.  Office hours are 8:00 a.m. to 4:30 p.m. Monday - Friday. Please note that voicemails left after 4:00 p.m. may not be returned until the following business day.  We are closed weekends and major holidays. You have access to a nurse at all times for urgent questions. Please call the main number to the clinic [phone] and follow the prompts.  For any non-urgent questions, you may also contact your provider using MyChart. We now offer e-Visits for anyone 18 and older to request care online for non-urgent symptoms. For details visit [email or web address].   Also download the MyChart app! Go to the app store, search "MyChart", open the app, select [facility], and log in with your MyChart username and password.   

## 2022-09-09 ENCOUNTER — Other Ambulatory Visit: Payer: Self-pay | Admitting: Dermatology

## 2022-09-10 ENCOUNTER — Encounter: Payer: Self-pay | Admitting: Internal Medicine

## 2022-09-10 ENCOUNTER — Other Ambulatory Visit: Payer: Self-pay

## 2022-09-10 MED ORDER — KETOCONAZOLE 2 % EX SHAM
1.0000 | MEDICATED_SHAMPOO | CUTANEOUS | 3 refills | Status: DC
  Filled 2022-09-10: qty 120, 84d supply, fill #0
  Filled 2022-11-19: qty 120, 84d supply, fill #1
  Filled ????-??-??: fill #2

## 2022-09-11 ENCOUNTER — Other Ambulatory Visit: Payer: Self-pay

## 2022-09-14 ENCOUNTER — Encounter: Payer: Self-pay | Admitting: Internal Medicine

## 2022-09-21 ENCOUNTER — Other Ambulatory Visit: Payer: Self-pay

## 2022-09-21 ENCOUNTER — Telehealth: Payer: Self-pay | Admitting: Primary Care

## 2022-09-21 ENCOUNTER — Ambulatory Visit (INDEPENDENT_AMBULATORY_CARE_PROVIDER_SITE_OTHER): Payer: 59 | Admitting: Urology

## 2022-09-21 ENCOUNTER — Inpatient Hospital Stay: Payer: 59

## 2022-09-21 ENCOUNTER — Encounter: Payer: Self-pay | Admitting: Urology

## 2022-09-21 VITALS — BP 136/74 | HR 89 | Ht 73.0 in | Wt 201.0 lb

## 2022-09-21 DIAGNOSIS — E291 Testicular hypofunction: Secondary | ICD-10-CM | POA: Diagnosis not present

## 2022-09-21 DIAGNOSIS — Z8639 Personal history of other endocrine, nutritional and metabolic disease: Secondary | ICD-10-CM

## 2022-09-21 DIAGNOSIS — R7989 Other specified abnormal findings of blood chemistry: Secondary | ICD-10-CM

## 2022-09-21 NOTE — Progress Notes (Signed)
I, Maysun L Gibbs,acting as a scribe for Riki Altes, MD.,have documented all relevant documentation on the behalf of Riki Altes, MD,as directed by  Riki Altes, MD while in the presence of Riki Altes, MD.  09/21/2022 9:54 AM   Armida Sans 1960/09/07 [id number]  Referring provider: Dale Tusayan, MD [address] [place],  [place] [address]  Chief Complaint  Patient presents with   Hypogonadism    HPI: Matthew Kelley is a 62 y.o. male here to discuss hypogonadism.   I previously followed him for low T 2015-2017 at [facility], and he was on Clomid and Anastrozole.  Last seen 2019 for post-vasectomy epididymitis. 1.5 year history of worsening fatigue, tiredness, decreased libido, and muscle aches. No bothersome LUTS Anemia with periodic iron infusions.  MI approximately two years ago. His testosterone level has not been checked in several years.   PMH: Past Medical History:  Diagnosis Date   Atypical nevus 09/07/2008   Left scapula, lateral. Moderate atypia.   Blood in stool    h/o   Cancer (HCC)    thyroid cancer   Dysplastic nevus 12/20/2009   Spinal lower back. Moderate atypia, edges free   Dysplastic nevus 12/20/2009   Left paraspinal lower back. Mild atypia, edges free.   Dysplastic nevus 11/24/2018   Right lower back. Mild atypia, deep margin involved.   Dysplastic nevus 11/24/2018   Right mid back. Moderate atypia and halo nevus effect. Limited margins free.   H/O total thyroidectomy    History of chicken pox    Hypercholesterolemia    Hypothyroidism    Sleep apnea    Thyroid cancer (HCC)    Vertigo     Surgical History: Past Surgical History:  Procedure Laterality Date   CHOLECYSTECTOMY  2000   CORONARY/GRAFT ACUTE MI REVASCULARIZATION N/A 06/16/2020   Procedure: Coronary/Graft Acute MI Revascularization;  Surgeon: Marcina Millard, MD;  Location: [facility] INVASIVE CV LAB;  Service: Cardiovascular;  Laterality:  N/A;   LEFT HEART CATH AND CORONARY ANGIOGRAPHY N/A 06/16/2020   Procedure: LEFT HEART CATH AND CORONARY ANGIOGRAPHY;  Surgeon: Marcina Millard, MD;  Location: [facility] INVASIVE CV LAB;  Service: Cardiovascular;  Laterality: N/A;   TOTAL THYROIDECTOMY  2009    Home Medications:  Allergies as of 09/21/2022   No Known Allergies      Medication List        Accurate as of September 21, 2022  9:54 AM. If you have any questions, ask your nurse or doctor.          STOP taking these medications    fluvastatin XL 80 MG 24 hr tablet Commonly known as: LESCOL XL Stopped by: Riki Altes, MD       TAKE these medications    aspirin 81 MG chewable tablet Chew 1 tablet (81 mg total) by mouth daily.   doxycycline 100 MG capsule Commonly known as: MONODOX Take 1 capsule (100 mg total) by mouth 2 (two) times daily. Take with food and drink   finasteride 1 MG tablet Commonly known as: PROPECIA TAKE 1 TABLET BY MOUTH ONCE DAILY   ketoconazole 2 % shampoo Commonly known as: NIZORAL Apply 1 application topically 2 (two) times a week.   minoxidil 2.5 MG tablet Commonly known as: LONITEN Take 2 tablets (5 mg total) by mouth daily.   Opzelura 1.5 % Crea Generic drug: Ruxolitinib Phosphate Apply 1 application  topically daily.   permethrin 5 % cream Commonly known as: ELIMITE  Apply 1 Application topically as directed.   Praluent 150 MG/ML Soaj Generic drug: Alirocumab Inject 150 mg into the skin every 14 (fourteen) days. What changed: Another medication with the same name was removed. Continue taking this medication, and follow the directions you see here. Changed by: Riki Altes, MD   prednisoLONE acetate 1 % ophthalmic suspension Commonly known as: PRED FORTE Place 1 drop into affected eyes 4 (four) times daily as directed for a total of 5 days.   PreserVision AREDS 2 Caps   Restasis 0.05 % ophthalmic emulsion Generic drug: cycloSPORINE PLACE ONE DROP IN BOTH EYES  TWICE A DAY   Restasis 0.05 % ophthalmic emulsion Generic drug: cycloSPORINE Place 1 drop into both eyes 2 (two) times daily.   Vitamin D 50 MCG (2000 UT) tablet Take 2,000 Units by mouth daily.        Allergies: No Known Allergies  Family History: Family History  Problem Relation Age of Onset   Hyperlipidemia Father    Heart disease Father     Social History:  reports that he has never smoked. He has never used smokeless tobacco. He reports current alcohol use. He reports that he does not use drugs.   Physical Exam: BP 136/74   Pulse 89   Ht 6\' 1"  (1.854 m)   Wt 201 lb (91.2 kg)   BMI 26.52 kg/m   Constitutional:  Alert and oriented, No acute distress. HEENT: [place] AT, moist mucus membranes.  Trachea midline, no masses. Cardiovascular: No clubbing, cyanosis, or edema. Respiratory: Normal respiratory effort, no increased work of breathing. GI: Abdomen is soft, nontender, nondistended, no abdominal masses Skin: No rashes, bruises or suspicious lesions. Neurologic: Grossly intact, no focal deficits, moving all 4 extremities. Psychiatric: Normal mood and affect.   Assessment & Plan:    1. History of hypogonadism Testosterone and LH drawn this morning, and if low, we'll schedule a repeat lab visit We discussed the diagnosis of testosterone is based on 2 abnormal total testosterone levels drawn in the a.m. admitted with signs and symptoms of low testosterone. We discussed various forms of testosterone placement including topical preparations, intramuscular injections, subcutaneous injections, subcutaneous pellet implantation and oral testosterone.  Pros and cons of each form were discussed.  The risk of transference of topical testosterone. I had an extensive discussion regarding testosterone replacement therapy including the following: Treatment may result in improvements in erectile function, low sex drive, anemia, bone mineral density, lean body mass, and depressive  symptoms; evidence is inconclusive whether testosterone therapy improves cognitive function, measures of diabetes, energy, fatigue, lipid profiles, and quality of life measures; there is no conclusive evidence linking testosterone therapy to the development of prostate cancer; there is no definitive evidence linking testosterone therapy to a higher incidence of venothrombolic events; at the present time it cannot be stated definitively whether testosterone therapy increases or decreases the risk of cardiovascular events including myocardial infarction and stroke. Potential side effects were discussed including erythrocytosis, gynecomastia.  The need for regular monitoring of testosterone levels and hematocrit was discussed.   [facility] [address], [address] [place], [place] [address] [phone]

## 2022-09-21 NOTE — Telephone Encounter (Signed)
Per Efraim Kaufmann, she has left a message for the patient to call back to schedule an appt.

## 2022-09-21 NOTE — Telephone Encounter (Signed)
Needs to be scheduled a video visit with Beth to check on how he is doing with pressure changes on cpap

## 2022-09-22 LAB — PROLACTIN: Prolactin: 11.9 ng/mL (ref 3.6–25.2)

## 2022-09-22 LAB — LUTEINIZING HORMONE: LH: 4.7 m[IU]/mL (ref 1.7–8.6)

## 2022-09-22 LAB — TESTOSTERONE: Testosterone: 294 ng/dL (ref 264–916)

## 2022-09-23 ENCOUNTER — Encounter: Payer: Self-pay | Admitting: Urology

## 2022-09-24 LAB — TESTOSTERONE,FREE AND TOTAL

## 2022-09-24 NOTE — Telephone Encounter (Signed)
Appt schedule 11/02/2022.

## 2022-09-27 DIAGNOSIS — G4733 Obstructive sleep apnea (adult) (pediatric): Secondary | ICD-10-CM | POA: Diagnosis not present

## 2022-09-28 ENCOUNTER — Encounter: Payer: Self-pay | Admitting: Urology

## 2022-09-28 LAB — TESTOSTERONE,FREE AND TOTAL: Testosterone: 306 ng/dL (ref 264–916)

## 2022-09-28 LAB — SPECIMEN STATUS REPORT

## 2022-10-01 ENCOUNTER — Other Ambulatory Visit: Payer: Self-pay

## 2022-10-01 ENCOUNTER — Other Ambulatory Visit: Payer: 59

## 2022-10-01 DIAGNOSIS — R7989 Other specified abnormal findings of blood chemistry: Secondary | ICD-10-CM | POA: Diagnosis not present

## 2022-10-01 DIAGNOSIS — Z125 Encounter for screening for malignant neoplasm of prostate: Secondary | ICD-10-CM

## 2022-10-03 LAB — TESTOSTERONE, BIOAVAILABLE (M): Testosterone: 346 ng/dL (ref 264–916)

## 2022-10-03 LAB — TESTOSTERONE,FREE AND TOTAL

## 2022-10-04 ENCOUNTER — Encounter: Payer: Self-pay | Admitting: Urology

## 2022-10-05 ENCOUNTER — Other Ambulatory Visit: Payer: Self-pay

## 2022-10-18 ENCOUNTER — Encounter: Payer: Self-pay | Admitting: Urology

## 2022-10-18 ENCOUNTER — Telehealth: Payer: Self-pay | Admitting: Urology

## 2022-10-18 NOTE — Telephone Encounter (Signed)
Pt LMOM asking about MyChart messages.  I wasn't sure if he was waiting for a reply.

## 2022-10-18 NOTE — Telephone Encounter (Signed)
Message was sent to Dr. Lonna Cobb  . He will respond

## 2022-10-19 ENCOUNTER — Inpatient Hospital Stay: Payer: 59 | Attending: Internal Medicine

## 2022-10-19 ENCOUNTER — Inpatient Hospital Stay: Payer: 59

## 2022-10-19 ENCOUNTER — Other Ambulatory Visit: Payer: Self-pay | Admitting: Urology

## 2022-10-19 ENCOUNTER — Other Ambulatory Visit: Payer: Self-pay

## 2022-10-19 DIAGNOSIS — D509 Iron deficiency anemia, unspecified: Secondary | ICD-10-CM | POA: Diagnosis not present

## 2022-10-19 DIAGNOSIS — E611 Iron deficiency: Secondary | ICD-10-CM

## 2022-10-19 LAB — CBC WITH DIFFERENTIAL/PLATELET
Abs Immature Granulocytes: 0.01 10*3/uL (ref 0.00–0.07)
Basophils Absolute: 0.1 10*3/uL (ref 0.0–0.1)
Basophils Relative: 1 %
Eosinophils Absolute: 0.2 10*3/uL (ref 0.0–0.5)
Eosinophils Relative: 4 %
HCT: 38.9 % — ABNORMAL LOW (ref 39.0–52.0)
Hemoglobin: 13.2 g/dL (ref 13.0–17.0)
Immature Granulocytes: 0 %
Lymphocytes Relative: 32 %
Lymphs Abs: 1.3 10*3/uL (ref 0.7–4.0)
MCH: 29.2 pg (ref 26.0–34.0)
MCHC: 33.9 g/dL (ref 30.0–36.0)
MCV: 86.1 fL (ref 80.0–100.0)
Monocytes Absolute: 0.4 10*3/uL (ref 0.1–1.0)
Monocytes Relative: 10 %
Neutro Abs: 2.1 10*3/uL (ref 1.7–7.7)
Neutrophils Relative %: 53 %
Platelets: 212 10*3/uL (ref 150–400)
RBC: 4.52 MIL/uL (ref 4.22–5.81)
RDW: 13.4 % (ref 11.5–15.5)
WBC: 4 10*3/uL (ref 4.0–10.5)
nRBC: 0 % (ref 0.0–0.2)

## 2022-10-19 MED ORDER — TESTOSTERONE 20.25 MG/ACT (1.62%) TD GEL
1.0000 | Freq: Every day | TRANSDERMAL | 2 refills | Status: DC
  Filled 2022-10-19 (×2): qty 75, 30d supply, fill #0
  Filled 2022-11-19: qty 75, 30d supply, fill #1

## 2022-10-23 ENCOUNTER — Other Ambulatory Visit: Payer: Self-pay

## 2022-10-27 DIAGNOSIS — G4733 Obstructive sleep apnea (adult) (pediatric): Secondary | ICD-10-CM | POA: Diagnosis not present

## 2022-11-02 ENCOUNTER — Telehealth (INDEPENDENT_AMBULATORY_CARE_PROVIDER_SITE_OTHER): Payer: 59 | Admitting: Primary Care

## 2022-11-02 DIAGNOSIS — G4733 Obstructive sleep apnea (adult) (pediatric): Secondary | ICD-10-CM

## 2022-11-02 NOTE — Progress Notes (Signed)
Virtual Visit via Video Note  I connected with Armida Sans on 11/02/22 at  9:00 AM EDT by a video enabled telemedicine application and verified that I am speaking with the correct person using two identifiers.  Location: Patient: Home Provider: Office    I discussed the limitations of evaluation and management by telemedicine and the availability of in person appointments. The patient expressed understanding and agreed to proceed.  History of Present Illness: 62 year old male, never smoked.  Past medical history significant for OSA, STEMI, coronary artery disease, hyperlipidemia, anemia, low testosterone, thyroid cancer, thyroidectomy.  Previous [facility] pulmonary encounter:  04/18/2022 Patient presents today for sleep consult. Hx sleep apnea, currently on CPAP. He has had 2 prior sleep studies done at [facility]. Polysomnography in 2017 showed moderate obstructive sleep apnea, AHI 17.9 an hour. He reports increased snoring symptoms and waking up gasping for air at night more recently. He is maintained on CPAP at 7cm h20.  DME was originally sleep med/ now [facility]. He is using tap pap mask, size medium nasal mask. Experiencing some airleaks and wants to try size large mask.   Sleep questionnaire Symptoms- snoring  Typical bedtime-10-11pm Time to fall asleep- 20-75mins Nocturnal awakenings- 3 times Start of day- 6:30am-7am Weight changes- 5 lbs Previous sleep study -  05/05/2015 [facility]  >> AHI 17.9 with spO2 low 80%  CPAP use- Yes, 7cm h20  Oxygen use- No Epworth score- 17  Airview download 03/19/22-04/17/21 Usage 30/30 days; 30 days (100%) > 4 hours Average usage 6 hours 52 mins Pressure 7cm h20 Airleaks 5.9L/min (95%) AHI 3.0    11/02/2022- Interim hx  Patient presents today to review OSA/ CPAP. He is doing well. Sleeping through the night. Reports having better mask seal. Currently using nasal mask. Still having a lot of fatigue symptoms. Recently started on Testosterone which has helped. He is  interested in learning more about Inspire device, we reviewed indications and benefits vs risk of hypoglossal nerve stimulator. He would need repeat sleep study if he wishes to pursue. He would like [place] to send him informational packet.   Airview download 10/02/22-10/31/23 Usage 29/30 days (97%) > 4 hours Average usage 7 hours 16 mins Pressure 7cm h20 Airleaks 5.3L/min AHI 2.7    Observations/Objective:  Appears well without overt respiratory symptoms   Assessment and Plan:  Sleep apnea - Well controlled on CPAP; Sleeping well, reports having better mask seal - Current pressure 7cm h20; Residual AHI 2.7/hour - No changes needed  - Reviewed inspire candidacy and indications, sending information packet for patient to review. If he wishes to pursue with work of for hypoglossal nerve stimulator he will need an update home sleep study   Follow Up Instructions:   1 year or sooner if needed   I discussed the assessment and treatment plan with the patient. The patient was provided an opportunity to ask questions and all were answered. The patient agreed with the plan and demonstrated an understanding of the instructions.   The patient was advised to call back or seek an in-person evaluation if the symptoms worsen or if the condition fails to improve as anticipated.  I provided 22 minutes of non-face-to-face time during this encounter.   Glenford Bayley, NP

## 2022-11-16 ENCOUNTER — Other Ambulatory Visit: Payer: Self-pay

## 2022-11-19 ENCOUNTER — Other Ambulatory Visit: Payer: Self-pay

## 2022-11-23 ENCOUNTER — Inpatient Hospital Stay: Payer: 59

## 2022-11-30 ENCOUNTER — Other Ambulatory Visit: Payer: Self-pay | Admitting: *Deleted

## 2022-11-30 DIAGNOSIS — R7989 Other specified abnormal findings of blood chemistry: Secondary | ICD-10-CM

## 2022-12-06 ENCOUNTER — Other Ambulatory Visit: Payer: Self-pay

## 2022-12-06 ENCOUNTER — Other Ambulatory Visit: Payer: 59

## 2022-12-06 DIAGNOSIS — R7989 Other specified abnormal findings of blood chemistry: Secondary | ICD-10-CM | POA: Diagnosis not present

## 2022-12-07 ENCOUNTER — Other Ambulatory Visit: Payer: 59

## 2022-12-07 ENCOUNTER — Other Ambulatory Visit: Payer: Self-pay

## 2022-12-07 ENCOUNTER — Encounter: Payer: Self-pay | Admitting: Urology

## 2022-12-07 LAB — TESTOSTERONE: Testosterone: 349 ng/dL (ref 264–916)

## 2022-12-09 DIAGNOSIS — M5010 Cervical disc disorder with radiculopathy, unspecified cervical region: Secondary | ICD-10-CM

## 2022-12-09 HISTORY — DX: Cervical disc disorder with radiculopathy, unspecified cervical region: M50.10

## 2022-12-10 ENCOUNTER — Other Ambulatory Visit: Payer: Self-pay | Admitting: Urology

## 2022-12-10 ENCOUNTER — Other Ambulatory Visit: Payer: Self-pay

## 2022-12-10 MED ORDER — TESTOSTERONE CYPIONATE 200 MG/ML IM SOLN
200.0000 mg | INTRAMUSCULAR | 0 refills | Status: DC
  Filled 2022-12-10 – 2022-12-12 (×2): qty 4, 56d supply, fill #0

## 2022-12-11 ENCOUNTER — Encounter: Payer: Self-pay | Admitting: *Deleted

## 2022-12-11 ENCOUNTER — Other Ambulatory Visit: Payer: Self-pay

## 2022-12-11 ENCOUNTER — Encounter: Payer: Self-pay | Admitting: Internal Medicine

## 2022-12-12 ENCOUNTER — Other Ambulatory Visit: Payer: Self-pay

## 2022-12-14 ENCOUNTER — Other Ambulatory Visit: Payer: Self-pay

## 2022-12-14 ENCOUNTER — Inpatient Hospital Stay (HOSPITAL_BASED_OUTPATIENT_CLINIC_OR_DEPARTMENT_OTHER): Payer: 59 | Admitting: Internal Medicine

## 2022-12-14 ENCOUNTER — Inpatient Hospital Stay: Payer: 59

## 2022-12-14 ENCOUNTER — Inpatient Hospital Stay: Payer: 59 | Attending: Internal Medicine

## 2022-12-14 ENCOUNTER — Encounter: Payer: Self-pay | Admitting: *Deleted

## 2022-12-14 ENCOUNTER — Encounter: Payer: Self-pay | Admitting: Internal Medicine

## 2022-12-14 VITALS — BP 137/78 | HR 77 | Temp 97.2°F | Ht 73.0 in | Wt 201.6 lb

## 2022-12-14 DIAGNOSIS — H43812 Vitreous degeneration, left eye: Secondary | ICD-10-CM | POA: Diagnosis not present

## 2022-12-14 DIAGNOSIS — D500 Iron deficiency anemia secondary to blood loss (chronic): Secondary | ICD-10-CM | POA: Insufficient documentation

## 2022-12-14 DIAGNOSIS — E611 Iron deficiency: Secondary | ICD-10-CM

## 2022-12-14 DIAGNOSIS — Q2730 Arteriovenous malformation, site unspecified: Secondary | ICD-10-CM | POA: Diagnosis not present

## 2022-12-14 DIAGNOSIS — H35372 Puckering of macula, left eye: Secondary | ICD-10-CM | POA: Diagnosis not present

## 2022-12-14 DIAGNOSIS — H353132 Nonexudative age-related macular degeneration, bilateral, intermediate dry stage: Secondary | ICD-10-CM | POA: Diagnosis not present

## 2022-12-14 DIAGNOSIS — H04123 Dry eye syndrome of bilateral lacrimal glands: Secondary | ICD-10-CM | POA: Diagnosis not present

## 2022-12-14 DIAGNOSIS — H26492 Other secondary cataract, left eye: Secondary | ICD-10-CM | POA: Diagnosis not present

## 2022-12-14 DIAGNOSIS — H35411 Lattice degeneration of retina, right eye: Secondary | ICD-10-CM | POA: Diagnosis not present

## 2022-12-14 LAB — BASIC METABOLIC PANEL - CANCER CENTER ONLY
Anion gap: 8 (ref 5–15)
BUN: 18 mg/dL (ref 8–23)
CO2: 27 mmol/L (ref 22–32)
Calcium: 8.8 mg/dL — ABNORMAL LOW (ref 8.9–10.3)
Chloride: 105 mmol/L (ref 98–111)
Creatinine: 1.04 mg/dL (ref 0.61–1.24)
GFR, Estimated: >60 mL/min (ref >60)
Glucose, Bld: 135 mg/dL — ABNORMAL HIGH (ref 70–99)
Potassium: 4 mmol/L (ref 3.5–5.1)
Sodium: 140 mmol/L (ref 135–145)

## 2022-12-14 LAB — CBC WITH DIFFERENTIAL (CANCER CENTER ONLY)
Abs Immature Granulocytes: 0.01 10*3/uL (ref 0.00–0.07)
Basophils Absolute: 0.1 10*3/uL (ref 0.0–0.1)
Basophils Relative: 1 %
Eosinophils Absolute: 0.2 10*3/uL (ref 0.0–0.5)
Eosinophils Relative: 5 %
HCT: 39.2 % (ref 39.0–52.0)
Hemoglobin: 13.1 g/dL (ref 13.0–17.0)
Immature Granulocytes: 0 %
Lymphocytes Relative: 28 %
Lymphs Abs: 1.3 10*3/uL (ref 0.7–4.0)
MCH: 29.2 pg (ref 26.0–34.0)
MCHC: 33.4 g/dL (ref 30.0–36.0)
MCV: 87.5 fL (ref 80.0–100.0)
Monocytes Absolute: 0.4 10*3/uL (ref 0.1–1.0)
Monocytes Relative: 10 %
Neutro Abs: 2.6 10*3/uL (ref 1.7–7.7)
Neutrophils Relative %: 56 %
Platelet Count: 217 10*3/uL (ref 150–400)
RBC: 4.48 MIL/uL (ref 4.22–5.81)
RDW: 13.3 % (ref 11.5–15.5)
WBC Count: 4.6 10*3/uL (ref 4.0–10.5)
nRBC: 0 % (ref 0.0–0.2)

## 2022-12-14 LAB — FERRITIN: Ferritin: 49 ng/mL (ref 24–336)

## 2022-12-14 LAB — IRON AND TIBC
Iron: 54 ug/dL (ref 45–182)
Saturation Ratios: 15 % — ABNORMAL LOW (ref 17.9–39.5)
TIBC: 358 ug/dL (ref 250–450)
UIBC: 304 ug/dL

## 2022-12-14 NOTE — Assessment & Plan Note (Addendum)
#   Anemia- iron deficiency-likely secondary to occult GI bleed [see below].  S/p Venofer.  #Patient currently getting Venofer on a monthly basis.  Hemoglobin today is 12.8.  The etiology is unclear-check PNH in 2 months/next lab draw.  Again, low clinical suspicion of PNH.  Continue every month-- Venofer.  Labs every other month.  #Etiology: Iron deficiency. unclear likely occult/ secondary to chronic GI bleeding/AVMs-s/p capsule study/ double balloon enteroscopy/capsule [April/MAY 2023]-do not suspect any bone marrow/PNH.  Status post referral to [facility]- EGD/[facility]o- OCT-2023- WNL- stable.   # CAD - [off Brillinta- dec 2022; on asprin 81 mg/day]- stable.   Will call re: venofer infusion.  # DISPOSITION: fridays # HOLD Venofer today # Every month- Venofer- # Labs- cbc-  every OTHER month # follow up in 6 months- MD; labs- cbc/bmp; iron studies/ferritin-possible venofer- Dr.B    Addendum: Iron saturation 15; ferritin 49.  Hemoglobin is 13.  Recommend IV iron infusion.  Once every 4 weeks  x2. Patient will be informed.

## 2022-12-14 NOTE — Progress Notes (Signed)
Fatigue/weakness: yes Dyspena: no Light headedness: no Blood in stool: no  Being tx for low testosterone.

## 2022-12-14 NOTE — Progress Notes (Signed)
[facility] CONSULT NOTE  Patient Care Team: Dale Somers, MD as PCP - General (Internal Medicine) Dale Kieler, MD (Internal Medicine) Earna Coder, MD as Consulting Physician (Oncology)  CHIEF COMPLAINTS/PURPOSE OF CONSULTATION: ANEMIA   HEMATOLOGY HISTORY:  # ANEMIA capsule- AVMs- actively bleeding [sep 2022]; EGD-; colonoscopy-FEB, 2020 < 2007- EGD-stretching of esophagus. prior history of GI bleed/AVMs more than 15 years ago. April/MAY 2023- GI balloon double enteroscopy/ May 2023 -capsule study-.study was unremarkable.   # CAD [on asprin 81 mg/day s/p stenting; off brillinta]  HISTORY OF PRESENTING ILLNESS: Ambulating independently.  Alone.  Matthew Kelley 62 y.o.  male with symptomatic iron deficiency [with poor response to p.o. iron] is here for follow-up.  Patient currently on iron infusion once a month.  Patient is just on aspirin.  Patient notes his energy level is improved but not back to normal.  No further episodes of GI bleed.  Review of Systems  Constitutional:  Positive for malaise/fatigue. Negative for chills, diaphoresis, fever and weight loss.  HENT:  Negative for nosebleeds and sore throat.   Eyes:  Negative for double vision.  Respiratory:  Negative for cough, hemoptysis, sputum production, shortness of breath and wheezing.   Cardiovascular:  Negative for chest pain, palpitations, orthopnea and leg swelling.  Gastrointestinal:  Negative for abdominal pain, blood in stool, constipation, diarrhea, heartburn, melena, nausea and vomiting.  Genitourinary:  Negative for dysuria, frequency and urgency.  Musculoskeletal:  Negative for back pain and joint pain.  Skin: Negative.  Negative for itching and rash.  Neurological:  Negative for dizziness, tingling, focal weakness, weakness and headaches.  Endo/Heme/Allergies:  Does not bruise/bleed easily.  Psychiatric/Behavioral:  Negative for depression. The patient is not nervous/anxious and  does not have insomnia.     MEDICAL HISTORY:  Past Medical History:  Diagnosis Date   Atypical nevus 09/07/2008   Left scapula, lateral. Moderate atypia.   Blood in stool    h/o   Cancer (HCC)    thyroid cancer   Dysplastic nevus 12/20/2009   Spinal lower back. Moderate atypia, edges free   Dysplastic nevus 12/20/2009   Left paraspinal lower back. Mild atypia, edges free.   Dysplastic nevus 11/24/2018   Right lower back. Mild atypia, deep margin involved.   Dysplastic nevus 11/24/2018   Right mid back. Moderate atypia and halo nevus effect. Limited margins free.   H/O total thyroidectomy    History of chicken pox    Hypercholesterolemia    Hypothyroidism    Sleep apnea    Thyroid cancer (HCC)    Vertigo     SURGICAL HISTORY: Past Surgical History:  Procedure Laterality Date   CHOLECYSTECTOMY  2000   CORONARY/GRAFT ACUTE MI REVASCULARIZATION N/A 06/16/2020   Procedure: Coronary/Graft Acute MI Revascularization;  Surgeon: Marcina Millard, MD;  Location: [facility] INVASIVE CV LAB;  Service: Cardiovascular;  Laterality: N/A;   LEFT HEART CATH AND CORONARY ANGIOGRAPHY N/A 06/16/2020   Procedure: LEFT HEART CATH AND CORONARY ANGIOGRAPHY;  Surgeon: Marcina Millard, MD;  Location: [facility] INVASIVE CV LAB;  Service: Cardiovascular;  Laterality: N/A;   TOTAL THYROIDECTOMY  2009    SOCIAL HISTORY: Social History   Socioeconomic History   Marital status: Married    Spouse name: Not on file   Number of children: Not on file   Years of education: Not on file   Highest education level: Not on file  Occupational History   Not on file  Tobacco Use   Smoking status: Never  Smokeless tobacco: Never  Vaping Use   Vaping status: Never Used  Substance and Sexual Activity   Alcohol use: Yes    Alcohol/week: 0.0 standard drinks of alcohol   Drug use: No   Sexual activity: Not on file  Other Topics Concern   Not on file  Social History Narrative   ** Merged History Encounter  **       Social Determinants of Health   Financial Resource Strain: Not on file  Food Insecurity: Not on file  Transportation Needs: Not on file  Physical Activity: Not on file  Stress: Not on file  Social Connections: Not on file  Intimate Partner Violence: Not on file    FAMILY HISTORY: Family History  Problem Relation Age of Onset   Hyperlipidemia Father    Heart disease Father     ALLERGIES:  has No Known Allergies.  MEDICATIONS:  Current Outpatient Medications  Medication Sig Dispense Refill   Alirocumab (PRALUENT) 150 MG/ML SOAJ Inject 150 mg into the skin every 14 (fourteen) days. 6 mL 4   aspirin 81 MG chewable tablet Chew 1 tablet (81 mg total) by mouth daily. 30 tablet 2   Cholecalciferol (VITAMIN D) 50 MCG (2000 UT) tablet Take 2,000 Units by mouth daily.     cycloSPORINE (RESTASIS) 0.05 % ophthalmic emulsion Place 1 drop into both eyes 2 (two) times daily. 180 each 3   ketoconazole (NIZORAL) 2 % shampoo Apply 1 application topically 2 (two) times a week. 360 mL 3   minoxidil (LONITEN) 2.5 MG tablet Take 2 tablets (5 mg total) by mouth daily. 180 tablet 3   Multiple Vitamins-Minerals (PRESERVISION AREDS 2) CAPS      permethrin (ELIMITE) 5 % cream Apply 1 Application topically as directed. 120 g 1   Ruxolitinib Phosphate (OPZELURA) 1.5 % CREA Apply 1 application  topically daily. 180 g 3   testosterone cypionate (DEPOTESTOSTERONE CYPIONATE) 200 MG/ML injection Inject 1 mL (200 mg total) into the muscle every 14 (fourteen) days. 4 mL 0   No current facility-administered medications for this visit.      PHYSICAL EXAMINATION:   Vitals:   12/14/22 1324  BP: 137/78  Pulse: 77  Temp: (!) 97.2 F (36.2 C)  SpO2: 100%    Filed Weights   12/14/22 1324  Weight: 201 lb 9.6 oz (91.4 kg)   Physical Exam Vitals and nursing note reviewed.  HENT:     Head: Normocephalic and atraumatic.     Mouth/Throat:     Pharynx: Oropharynx is clear.  Eyes:      Extraocular Movements: Extraocular movements intact.     Pupils: Pupils are equal, round, and reactive to light.  Cardiovascular:     Rate and Rhythm: Normal rate and regular rhythm.  Abdominal:     Palpations: Abdomen is soft.  Musculoskeletal:        General: Normal range of motion.     Cervical back: Normal range of motion.  Skin:    General: Skin is warm.  Neurological:     General: No focal deficit present.     Mental Status: He is alert and oriented to person, place, and time.  Psychiatric:        Behavior: Behavior normal.        Judgment: Judgment normal.    LABORATORY DATA:  I have reviewed the data as listed Lab Results  Component Value Date   WBC 4.6 12/14/2022   HGB 13.1 12/14/2022   HCT 39.2 12/14/2022  MCV 87.5 12/14/2022   PLT 217 12/14/2022   Recent Labs    02/16/22 1444 03/23/22 0738 06/15/22 1441 07/20/22 0812 12/14/22 1314  NA 137 141 138 142 140  K 3.7 3.8 3.8 3.9 4.0  CL 101 105 105 105 105  CO2 27 28 26 28 27   GLUCOSE 128* 103* 121* 99 135*  BUN 20 22 21 18 18   CREATININE 1.20 1.11 1.14 1.09 1.04  CALCIUM 8.6* 8.7 8.5* 8.9 8.8*  GFRNONAA >60  --  >60  --  >60  PROT 7.1 6.4  --  6.6  --   ALBUMIN 4.3 4.2  --  4.4  --   AST 25 16  --  14  --   ALT 33 18  --  15  --   ALKPHOS 76 65  --  74  --   BILITOT 0.5 0.8  --  0.6  --   BILIDIR  --  0.2  --  0.1  --      No results found.  Iron deficiency # Anemia- iron deficiency-likely secondary to occult GI bleed [see below].  S/p Venofer.  #Patient currently getting Venofer on a monthly basis.  Hemoglobin today is 12.8.  The etiology is unclear-check PNH in 2 months/next lab draw.  Again, low clinical suspicion of PNH.  Continue every month-- Venofer.  Labs every other month.  #Etiology: Iron deficiency. unclear likely occult/ secondary to chronic GI bleeding/AVMs-s/p capsule study/ double balloon enteroscopy/capsule [April/MAY 2023]-do not suspect any bone marrow/PNH.  Status post referral  to [facility]- EGD/Colo- OCT-2023- WNL- stable.   # CAD - [off Brillinta- dec 2022; on asprin 81 mg/day]- stable.   Will call re: venofer infusion.  # DISPOSITION: fridays # HOLD Venofer today # Every month- Venofer- # Labs- cbc-  every OTHER month # follow up in 6 months- MD; labs- cbc/bmp; iron studies/ferritin-possible venofer- Dr.B    Addendum: Iron saturation 15; ferritin 49.  Hemoglobin is 13.  Recommend IV iron infusion.  Once every 4 weeks  x2. Patient will be informed.    All questions were answered. The patient knows to call the clinic with any problems, questions or concerns.   Earna Coder, MD 12/14/2022 2:20 PM

## 2022-12-17 ENCOUNTER — Inpatient Hospital Stay: Payer: 59

## 2022-12-17 VITALS — BP 128/76 | HR 97 | Temp 98.4°F

## 2022-12-17 DIAGNOSIS — D500 Iron deficiency anemia secondary to blood loss (chronic): Secondary | ICD-10-CM | POA: Diagnosis not present

## 2022-12-17 DIAGNOSIS — Q2730 Arteriovenous malformation, site unspecified: Secondary | ICD-10-CM | POA: Diagnosis not present

## 2022-12-17 DIAGNOSIS — E611 Iron deficiency: Secondary | ICD-10-CM

## 2022-12-17 MED ORDER — SODIUM CHLORIDE 0.9 % IV SOLN
200.0000 mg | Freq: Once | INTRAVENOUS | Status: AC
  Administered 2022-12-17: 200 mg via INTRAVENOUS
  Filled 2022-12-17: qty 200

## 2022-12-17 MED ORDER — SODIUM CHLORIDE 0.9 % IV SOLN
Freq: Once | INTRAVENOUS | Status: AC
  Filled 2022-12-17: qty 250

## 2022-12-17 NOTE — Patient Instructions (Signed)
Iron Sucrose Injection What is this medication? IRON SUCROSE (EYE ern SOO krose) treats low levels of iron (iron deficiency anemia) in people with kidney disease. Iron is a mineral that plays an important role in making red blood cells, which carry oxygen from your lungs to the rest of your body. This medicine may be used for other purposes; ask your health care provider or pharmacist if you have questions. COMMON BRAND NAME(S): Venofer What should I tell my care team before I take this medication? They need to know if you have any of these conditions: Anemia not caused by low iron levels Heart disease High levels of iron in the blood Kidney disease Liver disease An unusual or allergic reaction to iron, other medications, foods, dyes, or preservatives Pregnant or trying to get pregnant Breastfeeding How should I use this medication? This medication is for infusion into a vein. It is given in a hospital or clinic setting. Talk to your care team about the use of this medication in children. While this medication may be prescribed for children as young as 2 years for selected conditions, precautions do apply. Overdosage: If you think you have taken too much of this medicine contact a poison control center or emergency room at once. NOTE: This medicine is only for you. Do not share this medicine with others. What if I miss a dose? Keep appointments for follow-up doses. It is important not to miss your dose. Call your care team if you are unable to keep an appointment. What may interact with this medication? Do not take this medication with any of the following: Deferoxamine Dimercaprol Other iron products This medication may also interact with the following: Chloramphenicol Deferasirox This list may not describe all possible interactions. Give your health care provider a list of all the medicines, herbs, non-prescription drugs, or dietary supplements you use. Also tell them if you smoke,  drink alcohol, or use illegal drugs. Some items may interact with your medicine. What should I watch for while using this medication? Visit your care team regularly. Tell your care team if your symptoms do not start to get better or if they get worse. You may need blood work done while you are taking this medication. You may need to follow a special diet. Talk to your care team. Foods that contain iron include: whole grains/cereals, dried fruits, beans, or peas, leafy green vegetables, and organ meats (liver, kidney). What side effects may I notice from receiving this medication? Side effects that you should report to your care team as soon as possible: Allergic reactions--skin rash, itching, hives, swelling of the face, lips, tongue, or throat Low blood pressure--dizziness, feeling faint or lightheaded, blurry vision Shortness of breath Side effects that usually do not require medical attention (report to your care team if they continue or are bothersome): Flushing Headache Joint pain Muscle pain Nausea Pain, redness, or irritation at injection site This list may not describe all possible side effects. Call your doctor for medical advice about side effects. You may report side effects to FDA at 1-800-FDA-1088. Where should I keep my medication? This medication is given in a hospital or clinic. It will not be stored at home. NOTE: This sheet is a summary. It may not cover all possible information. If you have questions about this medicine, talk to your doctor, pharmacist, or health care provider.  2024 Elsevier/Gold Standard (2022-08-31 00:00:00)

## 2022-12-18 ENCOUNTER — Other Ambulatory Visit: Payer: Self-pay

## 2022-12-18 DIAGNOSIS — L0291 Cutaneous abscess, unspecified: Secondary | ICD-10-CM | POA: Diagnosis not present

## 2022-12-18 NOTE — Progress Notes (Signed)
Anaerobic and aerobic culture

## 2022-12-19 DIAGNOSIS — G4733 Obstructive sleep apnea (adult) (pediatric): Secondary | ICD-10-CM | POA: Diagnosis not present

## 2022-12-24 LAB — ANAEROBIC AND AEROBIC CULTURE

## 2022-12-25 ENCOUNTER — Telehealth: Payer: Self-pay

## 2022-12-25 NOTE — Telephone Encounter (Signed)
-----   Message from Willeen Niece sent at 12/25/2022  1:45 PM EDT ----- Both negative - please call patient

## 2022-12-25 NOTE — Telephone Encounter (Signed)
Advised pt of culture results/sh

## 2022-12-28 ENCOUNTER — Ambulatory Visit: Payer: 59

## 2022-12-28 ENCOUNTER — Other Ambulatory Visit: Payer: Self-pay

## 2022-12-28 ENCOUNTER — Other Ambulatory Visit: Payer: 59

## 2022-12-28 ENCOUNTER — Ambulatory Visit: Payer: 59 | Admitting: Internal Medicine

## 2022-12-28 DIAGNOSIS — R7989 Other specified abnormal findings of blood chemistry: Secondary | ICD-10-CM

## 2022-12-28 DIAGNOSIS — E611 Iron deficiency: Secondary | ICD-10-CM

## 2023-01-12 ENCOUNTER — Encounter: Payer: Self-pay | Admitting: Internal Medicine

## 2023-01-12 ENCOUNTER — Other Ambulatory Visit: Payer: Self-pay | Admitting: Urology

## 2023-01-14 ENCOUNTER — Inpatient Hospital Stay: Payer: 59 | Attending: Internal Medicine

## 2023-01-14 ENCOUNTER — Other Ambulatory Visit: Payer: Self-pay

## 2023-01-14 ENCOUNTER — Other Ambulatory Visit: Payer: Self-pay | Admitting: Internal Medicine

## 2023-01-14 ENCOUNTER — Encounter: Payer: Self-pay | Admitting: Urology

## 2023-01-14 ENCOUNTER — Inpatient Hospital Stay: Payer: 59

## 2023-01-14 VITALS — BP 122/68 | HR 84 | Temp 98.9°F | Resp 16

## 2023-01-14 DIAGNOSIS — E611 Iron deficiency: Secondary | ICD-10-CM

## 2023-01-14 DIAGNOSIS — D509 Iron deficiency anemia, unspecified: Secondary | ICD-10-CM | POA: Insufficient documentation

## 2023-01-14 MED ORDER — SODIUM CHLORIDE 0.9 % IV SOLN
200.0000 mg | Freq: Once | INTRAVENOUS | Status: AC
  Administered 2023-01-14: 200 mg via INTRAVENOUS
  Filled 2023-01-14: qty 200

## 2023-01-14 MED ORDER — SODIUM CHLORIDE 0.9 % IV SOLN
Freq: Once | INTRAVENOUS | Status: AC
  Filled 2023-01-14: qty 250

## 2023-01-14 NOTE — Patient Instructions (Signed)
Iron Sucrose Injection What is this medication? IRON SUCROSE (EYE ern SOO krose) treats low levels of iron (iron deficiency anemia) in people with kidney disease. Iron is a mineral that plays an important role in making red blood cells, which carry oxygen from your lungs to the rest of your body. This medicine may be used for other purposes; ask your health care provider or pharmacist if you have questions. COMMON BRAND NAME(S): Venofer What should I tell my care team before I take this medication? They need to know if you have any of these conditions: Anemia not caused by low iron levels Heart disease High levels of iron in the blood Kidney disease Liver disease An unusual or allergic reaction to iron, other medications, foods, dyes, or preservatives Pregnant or trying to get pregnant Breastfeeding How should I use this medication? This medication is for infusion into a vein. It is given in a hospital or clinic setting. Talk to your care team about the use of this medication in children. While this medication may be prescribed for children as young as 2 years for selected conditions, precautions do apply. Overdosage: If you think you have taken too much of this medicine contact a poison control center or emergency room at once. NOTE: This medicine is only for you. Do not share this medicine with others. What if I miss a dose? Keep appointments for follow-up doses. It is important not to miss your dose. Call your care team if you are unable to keep an appointment. What may interact with this medication? Do not take this medication with any of the following: Deferoxamine Dimercaprol Other iron products This medication may also interact with the following: Chloramphenicol Deferasirox This list may not describe all possible interactions. Give your health care provider a list of all the medicines, herbs, non-prescription drugs, or dietary supplements you use. Also tell them if you smoke,  drink alcohol, or use illegal drugs. Some items may interact with your medicine. What should I watch for while using this medication? Visit your care team regularly. Tell your care team if your symptoms do not start to get better or if they get worse. You may need blood work done while you are taking this medication. You may need to follow a special diet. Talk to your care team. Foods that contain iron include: whole grains/cereals, dried fruits, beans, or peas, leafy green vegetables, and organ meats (liver, kidney). What side effects may I notice from receiving this medication? Side effects that you should report to your care team as soon as possible: Allergic reactions--skin rash, itching, hives, swelling of the face, lips, tongue, or throat Low blood pressure--dizziness, feeling faint or lightheaded, blurry vision Shortness of breath Side effects that usually do not require medical attention (report to your care team if they continue or are bothersome): Flushing Headache Joint pain Muscle pain Nausea Pain, redness, or irritation at injection site This list may not describe all possible side effects. Call your doctor for medical advice about side effects. You may report side effects to FDA at 1-800-FDA-1088. Where should I keep my medication? This medication is given in a hospital or clinic. It will not be stored at home. NOTE: This sheet is a summary. It may not cover all possible information. If you have questions about this medicine, talk to your doctor, pharmacist, or health care provider.  2024 Elsevier/Gold Standard (2022-08-31 00:00:00)

## 2023-01-14 NOTE — Progress Notes (Signed)
Patient declined to wait the 30 minutes for post iron infusion observation today. Tolerated infusion well. VSS. 

## 2023-01-14 NOTE — Progress Notes (Signed)
x

## 2023-01-15 ENCOUNTER — Other Ambulatory Visit: Payer: Self-pay

## 2023-01-15 MED ORDER — TESTOSTERONE CYPIONATE 200 MG/ML IM SOLN
200.0000 mg | INTRAMUSCULAR | 0 refills | Status: DC
Start: 2023-01-15 — End: 2023-03-21
  Filled 2023-01-15: qty 10, 140d supply, fill #0
  Filled 2023-01-22: qty 5, 80d supply, fill #0
  Filled 2023-01-22: qty 1, 4d supply, fill #0

## 2023-01-15 NOTE — Telephone Encounter (Signed)
Pt having done next appt 02/11/23.

## 2023-01-16 ENCOUNTER — Encounter: Payer: Self-pay | Admitting: Internal Medicine

## 2023-01-18 DIAGNOSIS — G4733 Obstructive sleep apnea (adult) (pediatric): Secondary | ICD-10-CM | POA: Diagnosis not present

## 2023-01-21 ENCOUNTER — Encounter: Payer: 59 | Admitting: Internal Medicine

## 2023-01-22 ENCOUNTER — Encounter: Payer: Self-pay | Admitting: Internal Medicine

## 2023-01-22 ENCOUNTER — Other Ambulatory Visit: Payer: Self-pay

## 2023-01-25 ENCOUNTER — Other Ambulatory Visit: Payer: Self-pay

## 2023-01-25 ENCOUNTER — Ambulatory Visit (INDEPENDENT_AMBULATORY_CARE_PROVIDER_SITE_OTHER): Payer: 59 | Admitting: Internal Medicine

## 2023-01-25 ENCOUNTER — Encounter: Payer: Self-pay | Admitting: Internal Medicine

## 2023-01-25 ENCOUNTER — Other Ambulatory Visit: Payer: Self-pay | Admitting: *Deleted

## 2023-01-25 ENCOUNTER — Encounter: Payer: Self-pay | Admitting: Urology

## 2023-01-25 VITALS — BP 118/70 | HR 81 | Temp 98.0°F | Resp 16 | Ht 73.0 in | Wt 207.0 lb

## 2023-01-25 DIAGNOSIS — G4733 Obstructive sleep apnea (adult) (pediatric): Secondary | ICD-10-CM

## 2023-01-25 DIAGNOSIS — R7989 Other specified abnormal findings of blood chemistry: Secondary | ICD-10-CM

## 2023-01-25 DIAGNOSIS — Z125 Encounter for screening for malignant neoplasm of prostate: Secondary | ICD-10-CM

## 2023-01-25 DIAGNOSIS — Z0000 Encounter for general adult medical examination without abnormal findings: Secondary | ICD-10-CM

## 2023-01-25 DIAGNOSIS — Z85850 Personal history of malignant neoplasm of thyroid: Secondary | ICD-10-CM | POA: Diagnosis not present

## 2023-01-25 DIAGNOSIS — E7800 Pure hypercholesterolemia, unspecified: Secondary | ICD-10-CM

## 2023-01-25 DIAGNOSIS — D509 Iron deficiency anemia, unspecified: Secondary | ICD-10-CM

## 2023-01-25 DIAGNOSIS — I25119 Atherosclerotic heart disease of native coronary artery with unspecified angina pectoris: Secondary | ICD-10-CM

## 2023-01-25 DIAGNOSIS — M542 Cervicalgia: Secondary | ICD-10-CM

## 2023-01-25 LAB — HEPATIC FUNCTION PANEL
ALT: 18 U/L (ref 0–53)
AST: 16 U/L (ref 0–37)
Albumin: 4.3 g/dL (ref 3.5–5.2)
Alkaline Phosphatase: 68 U/L (ref 39–117)
Bilirubin, Direct: 0.1 mg/dL (ref 0.0–0.3)
Total Bilirubin: 0.6 mg/dL (ref 0.2–1.2)
Total Protein: 6.7 g/dL (ref 6.0–8.3)

## 2023-01-25 LAB — BASIC METABOLIC PANEL
BUN: 19 mg/dL (ref 6–23)
CO2: 30 meq/L (ref 19–32)
Calcium: 8.8 mg/dL (ref 8.4–10.5)
Chloride: 104 meq/L (ref 96–112)
Creatinine, Ser: 1.17 mg/dL (ref 0.40–1.50)
GFR: 65.95 mL/min (ref >60.00)
Glucose, Bld: 100 mg/dL — ABNORMAL HIGH (ref 70–99)
Potassium: 4 meq/L (ref 3.5–5.1)
Sodium: 141 meq/L (ref 135–145)

## 2023-01-25 LAB — PSA: PSA: 0.57 ng/mL (ref 0.10–4.00)

## 2023-01-25 LAB — T4, FREE: Free T4: 1.01 ng/dL (ref 0.60–1.60)

## 2023-01-25 LAB — LIPID PANEL
Cholesterol: 165 mg/dL (ref 0–200)
HDL: 46.2 mg/dL (ref >39.00)
LDL Cholesterol: 102 mg/dL — ABNORMAL HIGH (ref 0–99)
NonHDL: 119.19
Total CHOL/HDL Ratio: 4
Triglycerides: 85 mg/dL (ref 0.0–149.0)
VLDL: 17 mg/dL (ref 0.0–40.0)

## 2023-01-25 LAB — TSH: TSH: 3.55 u[IU]/mL (ref 0.35–5.50)

## 2023-01-25 NOTE — Progress Notes (Unsigned)
Subjective:    Patient ID: Matthew Kelley, male    DOB: [birth year], 62 y.o.   MRN: [id number]  Patient here for  Chief Complaint  Patient presents with   Annual Exam    HPI Here for a physical exam. Seeing Dr Donneta Romberg for IDA. Receiving venofer on a monthly basis. Has known sleep apnea.  Wearing cpap.  History of  ATEMI s/p PCI/DES of proximal LAD.  Continues on aspirin. Is exercising.  No chest pain or sob with increased activity or exertion.  No cough or congestion.  No abdominal pain or bowel change. No bleeding.  Has noticed some fatigue.  Saw Dr Lonna Cobb.  Testosterone level - low.  On replacement now.  Feeling some better.  Some neck/shoulder pain - left.  Present for a few weeks.  Increased with rotation of head.  Full rom - left arm.    Past Medical History:  Diagnosis Date   Atypical nevus 09/07/2008   Left scapula, lateral. Moderate atypia.   Blood in stool    h/o   Cancer (HCC)    thyroid cancer   Dysplastic nevus 12/20/2009   Spinal lower back. Moderate atypia, edges free   Dysplastic nevus 12/20/2009   Left paraspinal lower back. Mild atypia, edges free.   Dysplastic nevus 11/24/2018   Right lower back. Mild atypia, deep margin involved.   Dysplastic nevus 11/24/2018   Right mid back. Moderate atypia and halo nevus effect. Limited margins free.   H/O total thyroidectomy    History of chicken pox    Hypercholesterolemia    Hypothyroidism    Sleep apnea    Thyroid cancer (HCC)    Vertigo    Past Surgical History:  Procedure Laterality Date   CHOLECYSTECTOMY  2000   CORONARY/GRAFT ACUTE MI REVASCULARIZATION N/A 06/16/2020   Procedure: Coronary/Graft Acute MI Revascularization;  Surgeon: Marcina Millard, MD;  Location: [facility] INVASIVE CV LAB;  Service: Cardiovascular;  Laterality: N/A;   LEFT HEART CATH AND CORONARY ANGIOGRAPHY N/A 06/16/2020   Procedure: LEFT HEART CATH AND CORONARY ANGIOGRAPHY;  Surgeon: Marcina Millard, MD;  Location: [facility] INVASIVE CV  LAB;  Service: Cardiovascular;  Laterality: N/A;   TOTAL THYROIDECTOMY  2009   Family History  Problem Relation Age of Onset   Hyperlipidemia Father    Heart disease Father    Social History   Socioeconomic History   Marital status: Married    Spouse name: Not on file   Number of children: Not on file   Years of education: Not on file   Highest education level: Not on file  Occupational History   Not on file  Tobacco Use   Smoking status: Never   Smokeless tobacco: Never  Vaping Use   Vaping status: Never Used  Substance and Sexual Activity   Alcohol use: Yes    Alcohol/week: 0.0 standard drinks of alcohol   Drug use: No   Sexual activity: Not on file  Other Topics Concern   Not on file  Social History Narrative   ** Merged History Encounter **       Social Determinants of Health   Financial Resource Strain: Not on file  Food Insecurity: Not on file  Transportation Needs: Not on file  Physical Activity: Not on file  Stress: Not on file  Social Connections: Not on file     Review of Systems  Constitutional:  Negative for appetite change and unexpected weight change.  HENT:  Negative for congestion, sinus pressure and sore throat.  Eyes:  Negative for pain and visual disturbance.  Respiratory:  Negative for cough, chest tightness and shortness of breath.   Cardiovascular:  Negative for chest pain, palpitations and leg swelling.  Gastrointestinal:  Negative for abdominal pain, diarrhea, nausea and vomiting.  Genitourinary:  Negative for difficulty urinating and dysuria.  Musculoskeletal:  Positive for neck pain. Negative for joint swelling and myalgias.  Skin:  Negative for color change and rash.  Neurological:  Negative for dizziness and headaches.  Hematological:  Negative for adenopathy. Does not bruise/bleed easily.  Psychiatric/Behavioral:  Negative for agitation and dysphoric mood.        Objective:     BP 118/70   Pulse 81   Temp 98 F (36.7 C)    Resp 16   Ht 6\' 1"  (1.854 m)   Wt 207 lb (93.9 kg)   SpO2 98%   BMI 27.31 kg/m  Wt Readings from Last 3 Encounters:  01/25/23 207 lb (93.9 kg)  12/14/22 201 lb 9.6 oz (91.4 kg)  09/21/22 201 lb (91.2 kg)    Physical Exam Constitutional:      General: He is not in acute distress.    Appearance: Normal appearance. He is well-developed.  HENT:     Head: Normocephalic and atraumatic.     Right Ear: External ear normal.     Left Ear: External ear normal.  Eyes:     General: No scleral icterus.       Right eye: No discharge.        Left eye: No discharge.     Conjunctiva/sclera: Conjunctivae normal.  Neck:     Thyroid: No thyromegaly.  Cardiovascular:     Rate and Rhythm: Normal rate and regular rhythm.  Pulmonary:     Effort: No respiratory distress.     Breath sounds: Normal breath sounds. No wheezing.  Abdominal:     General: Bowel sounds are normal.     Palpations: Abdomen is soft.     Tenderness: There is no abdominal tenderness.  Musculoskeletal:        General: No swelling or tenderness.     Cervical back: Neck supple. No tenderness.     Comments: Increased pain - rotation of head - posterior neck / upper shoulder. Good rom.   Lymphadenopathy:     Cervical: No cervical adenopathy.  Skin:    Findings: No erythema or rash.  Neurological:     Mental Status: He is alert and oriented to person, place, and time.  Psychiatric:        Mood and Affect: Mood normal.        Behavior: Behavior normal.     Outpatient Encounter Medications as of 01/25/2023  Medication Sig   levothyroxine (SYNTHROID) 175 MCG tablet Take 175 mcg by mouth daily before breakfast.   Alirocumab (PRALUENT) 150 MG/ML SOAJ Inject 150 mg into the skin every 14 (fourteen) days.   aspirin 81 MG chewable tablet Chew 1 tablet (81 mg total) by mouth daily.   Cholecalciferol (VITAMIN D) 50 MCG (2000 UT) tablet Take 2,000 Units by mouth daily.   cycloSPORINE (RESTASIS) 0.05 % ophthalmic emulsion Place 1  drop into both eyes 2 (two) times daily.   ketoconazole (NIZORAL) 2 % shampoo Apply 1 application topically 2 (two) times a week.   minoxidil (LONITEN) 2.5 MG tablet Take 2 tablets (5 mg total) by mouth daily.   Multiple Vitamins-Minerals (PRESERVISION AREDS 2) CAPS    Ruxolitinib Phosphate (OPZELURA) 1.5 % CREA Apply 1  application  topically daily.   testosterone cypionate (DEPOTESTOSTERONE CYPIONATE) 200 MG/ML injection Inject 1 mL (200 mg total) into the muscle every 14 (fourteen) days.   [DISCONTINUED] permethrin (ELIMITE) 5 % cream Apply 1 Application topically as directed.   No facility-administered encounter medications on file as of 01/25/2023.     Lab Results  Component Value Date   WBC 4.6 12/14/2022   HGB 13.1 12/14/2022   HCT 39.2 12/14/2022   PLT 217 12/14/2022   GLUCOSE 135 (H) 12/14/2022   CHOL 118 07/20/2022   TRIG 106.0 07/20/2022   HDL 42.80 07/20/2022   LDLCALC 54 07/20/2022   ALT 15 07/20/2022   AST 14 07/20/2022   NA 140 12/14/2022   K 4.0 12/14/2022   CL 105 12/14/2022   CREATININE 1.04 12/14/2022   BUN 18 12/14/2022   CO2 27 12/14/2022   TSH 0.37 07/20/2022   PSA 0.13 03/23/2022   INR 1.0 06/16/2020   HGBA1C 5.7 09/04/2019       Assessment & Plan:  Hypercholesterolemia -     Basic metabolic panel -     Hepatic function panel -     Lipid panel  THYROID CANCER, HX OF -     TSH -     T4, free  Health care maintenance Assessment & Plan: Physical today 01/05/22.  Colonoscopy 05/2018. F/u planned 01/2022.  Check psa next labs.    Prostate cancer screening -     PSA     Dale Suwannee, MD

## 2023-01-25 NOTE — Assessment & Plan Note (Signed)
Physical today 01/05/22.  Colonoscopy 05/2018. F/u planned 01/2022.  Check psa next labs.

## 2023-01-26 ENCOUNTER — Other Ambulatory Visit: Payer: Self-pay

## 2023-01-26 DIAGNOSIS — M542 Cervicalgia: Secondary | ICD-10-CM | POA: Insufficient documentation

## 2023-01-26 MED ORDER — PRALUENT 150 MG/ML ~~LOC~~ SOAJ
150.0000 mg | SUBCUTANEOUS | 4 refills | Status: DC
  Filled 2023-01-26 – 2023-02-08 (×2): qty 6, 84d supply, fill #0

## 2023-01-26 NOTE — Assessment & Plan Note (Signed)
Followed by Dr Evlyn Kanner. On synthroid.  Follow tsh. Check tsh and free T4 today.

## 2023-01-26 NOTE — Assessment & Plan Note (Signed)
Has been receiving iron infusions.  Continue f/u with Dr Donneta Romberg. EGD: 01/26/2022 - no endoscopic abnormality evident to explain patient's complaint of dysphagia, normal esophagus, gastritis, normal duodenum. No sources of anemia found. CSY: 01/26/2022 - normal examined colon, Grade II non-bleeding IH

## 2023-01-26 NOTE — Assessment & Plan Note (Signed)
Followed by Dr Lonna Cobb.  Receiving testosterone replacement.

## 2023-01-26 NOTE — Assessment & Plan Note (Signed)
Neck and posterior shoulder pain as outlined.  Discussed PT.  Agreeable.

## 2023-01-26 NOTE — Assessment & Plan Note (Signed)
Followed by pulmonary.  Continues with cpap.

## 2023-01-26 NOTE — Assessment & Plan Note (Signed)
On praluent.  Low cholesterol diet and exercise.  Follow lipid panel. Check labs.

## 2023-01-26 NOTE — Assessment & Plan Note (Signed)
History of anterior STEMI s/p PCI/DES of proximal LAD.  Continue aspirin and praluent. Previous stress test ok.  No chest pain now.  Follow. Plans to establish with new cardiologist.

## 2023-01-27 ENCOUNTER — Other Ambulatory Visit: Payer: Self-pay

## 2023-01-28 ENCOUNTER — Other Ambulatory Visit: Payer: 59

## 2023-01-28 DIAGNOSIS — R7989 Other specified abnormal findings of blood chemistry: Secondary | ICD-10-CM

## 2023-01-29 ENCOUNTER — Encounter: Payer: Self-pay | Admitting: Internal Medicine

## 2023-01-29 DIAGNOSIS — M542 Cervicalgia: Secondary | ICD-10-CM

## 2023-01-29 DIAGNOSIS — R202 Paresthesia of skin: Secondary | ICD-10-CM

## 2023-01-29 LAB — TESTOSTERONE: Testosterone: 901 ng/dL (ref 264–916)

## 2023-01-29 NOTE — Telephone Encounter (Signed)
Order placed for neurosurgery referral 

## 2023-01-30 ENCOUNTER — Telehealth: Payer: Self-pay

## 2023-01-30 NOTE — Progress Notes (Signed)
Referring Physician:  Dale [place], MD [address] [place],  [place] [address]  Primary Physician:  Dale [place], MD  History of Present Illness: 02/01/2023 [name] is here today with a chief complaint of neck and arm symptoms.  He began having neck and posterior left shoulder pain approximately 2 months ago.  He had 1 episode of this approximately 2 to 3 months prior to that which resolved in approximately 6 weeks.  Over the past 4 days, he has also had onset of left thumb numbness.  He has some possible discomfort in his left forearm.  He denies any weakness.  Working on the computer and turning his head to the left to make his pain worse.  Ibuprofen has helped.  He started physical therapy today which included traction and exercises.  This also helped his symptoms.  He has no symptoms of cervical myelopathy.  Bowel/Bladder Dysfunction: none  Conservative measures:  Physical therapy: scheduled for 02/01/23 with [place] [facility]  Multimodal medical therapy including regular antiinflammatories: none  Injections: none epidural steroid injections  Past Surgery: none  The symptoms are causing a significant impact on the patient's life.   I have utilized the care everywhere function in epic to review the outside records available from external health systems.  Review of Systems:  A 10 point review of systems is negative, except for the pertinent positives and negatives detailed in the HPI.  Past Medical History: Past Medical History:  Diagnosis Date   Atypical nevus 09/07/2008   Left scapula, lateral. Moderate atypia.   Blood in stool    h/o   Cancer (HCC)    thyroid cancer   Dysplastic nevus 12/20/2009   Spinal lower back. Moderate atypia, edges free   Dysplastic nevus 12/20/2009   Left paraspinal lower back. Mild atypia, edges free.   Dysplastic nevus 11/24/2018   Right lower back. Mild atypia, deep margin involved.    Dysplastic nevus 11/24/2018   Right mid back. Moderate atypia and halo nevus effect. Limited margins free.   H/O total thyroidectomy    History of chicken pox    Hypercholesterolemia    Hypothyroidism    Sleep apnea    Thyroid cancer (HCC)    Vertigo     Past Surgical History: Past Surgical History:  Procedure Laterality Date   CHOLECYSTECTOMY  2000   CORONARY/GRAFT ACUTE MI REVASCULARIZATION N/A 06/16/2020   Procedure: Coronary/Graft Acute MI Revascularization;  Surgeon: Marcina Millard, MD;  Location: [facility] INVASIVE CV LAB;  Service: Cardiovascular;  Laterality: N/A;   LEFT HEART CATH AND CORONARY ANGIOGRAPHY N/A 06/16/2020   Procedure: LEFT HEART CATH AND CORONARY ANGIOGRAPHY;  Surgeon: Marcina Millard, MD;  Location: [facility] INVASIVE CV LAB;  Service: Cardiovascular;  Laterality: N/A;   TOTAL THYROIDECTOMY  2009    Allergies: Allergies as of 02/01/2023   (No Known Allergies)    Medications:  Current Outpatient Medications:    Alirocumab (PRALUENT) 150 MG/ML SOAJ, Inject 150 mg into the skin every 14 (fourteen) days., Disp: 6 mL, Rfl: 4   aspirin 81 MG chewable tablet, Chew 1 tablet (81 mg total) by mouth daily., Disp: 30 tablet, Rfl: 2   Cholecalciferol (VITAMIN D) 50 MCG (2000 UT) tablet, Take 2,000 Units by mouth daily., Disp: , Rfl:    cycloSPORINE (RESTASIS) 0.05 % ophthalmic emulsion, Place 1 drop into both eyes 2 (two) times daily., Disp: 180 each, Rfl: 3   ketoconazole (NIZORAL) 2 % shampoo, Apply 1 application topically  2 (two) times a week., Disp: 360 mL, Rfl: 3   levothyroxine (SYNTHROID) 175 MCG tablet, Take 175 mcg by mouth daily before breakfast., Disp: , Rfl:    minoxidil (LONITEN) 2.5 MG tablet, Take 2 tablets (5 mg total) by mouth daily., Disp: 180 tablet, Rfl: 3   Multiple Vitamins-Minerals (PRESERVISION AREDS 2) CAPS, , Disp: , Rfl:    Ruxolitinib Phosphate (OPZELURA) 1.5 % CREA, Apply 1 application  topically daily., Disp: 180 g, Rfl: 3   testosterone  cypionate (DEPOTESTOSTERONE CYPIONATE) 200 MG/ML injection, Inject 1 mL (200 mg total) into the muscle every 14 (fourteen) days., Disp: 10 mL, Rfl: 0  Social History: Social History   Tobacco Use   Smoking status: Never   Smokeless tobacco: Never  Vaping Use   Vaping status: Never Used  Substance Use Topics   Alcohol use: Yes    Alcohol/week: 0.0 standard drinks of alcohol   Drug use: No    Family Medical History: Family History  Problem Relation Age of Onset   Hyperlipidemia Father    Heart disease Father     Physical Examination: Vitals:   02/01/23 1306  BP: (!) 162/110    General: Patient is in no apparent distress. Attention to examination is appropriate.  Neck:   Supple.  Full range of motion with discomfort on full extension and rotation to the L.  Respiratory: Patient is breathing without any difficulty.   NEUROLOGICAL:     Awake, alert, oriented to person, place, and time.  Speech is clear and fluent.   Cranial Nerves: Pupils equal round and reactive to light.  Facial tone is symmetric.  Facial sensation is symmetric. Shoulder shrug is symmetric. Tongue protrusion is midline.  There is no pronator drift.  Strength: Side Biceps Triceps Deltoid Interossei Grip Wrist Ext. Wrist Flex.  R 5 5 5 5 5 5 5   L 5 5 5 5 5 5 5    Side Iliopsoas Quads Hamstring PF DF EHL  R 5 5 5 5 5 5   L 5 5 5 5 5 5    Reflexes are 2+ and symmetric at the biceps, triceps, brachioradialis, patella and achilles.   Hoffman's is absent.   Bilateral upper and lower extremity sensation is intact to light touch.    No evidence of dysmetria noted.  Gait is normal.     Medical Decision Making  Imaging: None to review  I have personally reviewed the images and agree with the above interpretation.  Assessment and Plan: Mr. Mcneary is a pleasant 62 y.o. male with symptoms most consistent with a left-sided C6 radiculopathy.  He just started physical therapy today.  He will start  ibuprofen 600 mg 3 times daily or naproxen 2 over-the-counter tablets twice daily to help with his discomfort.  We reviewed the natural history of cervical radiculopathy.  He is very likely to recover from this.  I will touch base with him in approximately 2 weeks.  If he is making forward progress at that time, we will defer any further decisions regarding imaging.  If his symptoms worsen or continue at the current level between now and 2 weeks from now, I we will order a cervical spine MRI scan without contrast for workup and for planning of any possible epidural steroid injections.   I spent a total of 30 minutes in this patient's care today. This time was spent reviewing pertinent records including imaging studies, obtaining and confirming history, performing a directed evaluation, formulating and discussing my recommendations, and  documenting the visit within the medical record.   Thank you for involving me in the care of this patient.      Brier Firebaugh K. Myer Haff MD, [facility] Neurosurgery

## 2023-01-30 NOTE — Telephone Encounter (Signed)
-----   Message from Mariano Colan sent at 01/26/2023 11:54 AM EDT ----- Notify - kidney function overall relatively stable. Cholesterol has increased.  From last check.  Per discussion at appt, he is taking praluent.  His LDL is 102. Goal LDL <70.  Was previously on lescol and praluent.  See if agreeable to restart statin medication.  (Any problems with statin?).  If agreeable, can start a low dose statin.  Also if has had problems with statin medication, see if knows which statins?  He is planning to f/u with cardiology. If does not want to add medication now, then would recommend discussing with cardiology.  TSH and Free T4 wnl.  Please forward to Dr Evlyn Kanner - (his endocrinologist).  PSA wnl. Liver function tests wnl.

## 2023-01-30 NOTE — Telephone Encounter (Signed)
Lvm for pt to give office a call back in regards to labs

## 2023-02-01 ENCOUNTER — Encounter: Payer: Self-pay | Admitting: Neurosurgery

## 2023-02-01 ENCOUNTER — Ambulatory Visit (INDEPENDENT_AMBULATORY_CARE_PROVIDER_SITE_OTHER): Payer: 59 | Admitting: Neurosurgery

## 2023-02-01 VITALS — BP 162/110 | Ht 73.0 in | Wt 202.0 lb

## 2023-02-01 DIAGNOSIS — M542 Cervicalgia: Secondary | ICD-10-CM | POA: Diagnosis not present

## 2023-02-01 DIAGNOSIS — M5412 Radiculopathy, cervical region: Secondary | ICD-10-CM

## 2023-02-01 DIAGNOSIS — M5413 Radiculopathy, cervicothoracic region: Secondary | ICD-10-CM | POA: Diagnosis not present

## 2023-02-04 DIAGNOSIS — M542 Cervicalgia: Secondary | ICD-10-CM | POA: Diagnosis not present

## 2023-02-04 DIAGNOSIS — M5413 Radiculopathy, cervicothoracic region: Secondary | ICD-10-CM | POA: Diagnosis not present

## 2023-02-06 ENCOUNTER — Other Ambulatory Visit: Payer: Self-pay

## 2023-02-06 DIAGNOSIS — I25119 Atherosclerotic heart disease of native coronary artery with unspecified angina pectoris: Secondary | ICD-10-CM

## 2023-02-06 NOTE — Telephone Encounter (Signed)
Pt called back and he has reviewed the results on mychart. Pt stated it was some meds that Lorin Picket was going to change and if he is not available then leave a voice mail

## 2023-02-08 ENCOUNTER — Other Ambulatory Visit: Payer: Self-pay

## 2023-02-08 DIAGNOSIS — M5413 Radiculopathy, cervicothoracic region: Secondary | ICD-10-CM | POA: Diagnosis not present

## 2023-02-08 DIAGNOSIS — M542 Cervicalgia: Secondary | ICD-10-CM | POA: Diagnosis not present

## 2023-02-08 DIAGNOSIS — I2510 Atherosclerotic heart disease of native coronary artery without angina pectoris: Secondary | ICD-10-CM | POA: Diagnosis not present

## 2023-02-08 DIAGNOSIS — E782 Mixed hyperlipidemia: Secondary | ICD-10-CM | POA: Diagnosis not present

## 2023-02-08 MED ORDER — ATORVASTATIN CALCIUM 10 MG PO TABS
10.0000 mg | ORAL_TABLET | Freq: Every day | ORAL | 11 refills | Status: DC
  Filled 2023-02-08: qty 30, 30d supply, fill #0
  Filled 2023-03-10: qty 30, 30d supply, fill #1

## 2023-02-10 ENCOUNTER — Other Ambulatory Visit: Payer: Self-pay

## 2023-02-11 ENCOUNTER — Other Ambulatory Visit: Payer: 59

## 2023-02-11 ENCOUNTER — Inpatient Hospital Stay: Payer: 59 | Attending: Internal Medicine

## 2023-02-11 ENCOUNTER — Other Ambulatory Visit: Payer: Self-pay

## 2023-02-11 DIAGNOSIS — M5413 Radiculopathy, cervicothoracic region: Secondary | ICD-10-CM | POA: Diagnosis not present

## 2023-02-11 DIAGNOSIS — D509 Iron deficiency anemia, unspecified: Secondary | ICD-10-CM | POA: Diagnosis not present

## 2023-02-11 DIAGNOSIS — M542 Cervicalgia: Secondary | ICD-10-CM | POA: Diagnosis not present

## 2023-02-11 DIAGNOSIS — E611 Iron deficiency: Secondary | ICD-10-CM

## 2023-02-11 LAB — CBC WITH DIFFERENTIAL (CANCER CENTER ONLY)
Abs Immature Granulocytes: 0 10*3/uL (ref 0.00–0.07)
Basophils Absolute: 0.1 10*3/uL (ref 0.0–0.1)
Basophils Relative: 2 %
Eosinophils Absolute: 0.1 10*3/uL (ref 0.0–0.5)
Eosinophils Relative: 4 %
HCT: 39.3 % (ref 39.0–52.0)
Hemoglobin: 13.1 g/dL (ref 13.0–17.0)
Immature Granulocytes: 0 %
Lymphocytes Relative: 28 %
Lymphs Abs: 0.9 10*3/uL (ref 0.7–4.0)
MCH: 29.2 pg (ref 26.0–34.0)
MCHC: 33.3 g/dL (ref 30.0–36.0)
MCV: 87.7 fL (ref 80.0–100.0)
Monocytes Absolute: 0.4 10*3/uL (ref 0.1–1.0)
Monocytes Relative: 10 %
Neutro Abs: 1.9 10*3/uL (ref 1.7–7.7)
Neutrophils Relative %: 56 %
Platelet Count: 234 10*3/uL (ref 150–400)
RBC: 4.48 MIL/uL (ref 4.22–5.81)
RDW: 13.6 % (ref 11.5–15.5)
WBC Count: 3.4 10*3/uL — ABNORMAL LOW (ref 4.0–10.5)
nRBC: 0 % (ref 0.0–0.2)

## 2023-02-12 ENCOUNTER — Other Ambulatory Visit: Payer: Self-pay

## 2023-02-13 ENCOUNTER — Other Ambulatory Visit: Payer: Self-pay

## 2023-02-15 ENCOUNTER — Other Ambulatory Visit: Payer: Self-pay

## 2023-02-15 ENCOUNTER — Encounter: Payer: Self-pay | Admitting: Neurosurgery

## 2023-02-15 ENCOUNTER — Inpatient Hospital Stay: Payer: 59

## 2023-02-15 ENCOUNTER — Encounter: Payer: Self-pay | Admitting: Internal Medicine

## 2023-02-15 DIAGNOSIS — M542 Cervicalgia: Secondary | ICD-10-CM | POA: Diagnosis not present

## 2023-02-15 DIAGNOSIS — M5413 Radiculopathy, cervicothoracic region: Secondary | ICD-10-CM | POA: Diagnosis not present

## 2023-02-18 ENCOUNTER — Other Ambulatory Visit: Payer: Self-pay

## 2023-02-18 ENCOUNTER — Ambulatory Visit: Payer: 59 | Admitting: Dermatology

## 2023-02-18 ENCOUNTER — Other Ambulatory Visit: Payer: Self-pay | Admitting: Neurosurgery

## 2023-02-18 DIAGNOSIS — Z1283 Encounter for screening for malignant neoplasm of skin: Secondary | ICD-10-CM

## 2023-02-18 DIAGNOSIS — M5413 Radiculopathy, cervicothoracic region: Secondary | ICD-10-CM | POA: Diagnosis not present

## 2023-02-18 DIAGNOSIS — G4733 Obstructive sleep apnea (adult) (pediatric): Secondary | ICD-10-CM | POA: Diagnosis not present

## 2023-02-18 DIAGNOSIS — L700 Acne vulgaris: Secondary | ICD-10-CM

## 2023-02-18 DIAGNOSIS — B009 Herpesviral infection, unspecified: Secondary | ICD-10-CM

## 2023-02-18 DIAGNOSIS — Z86018 Personal history of other benign neoplasm: Secondary | ICD-10-CM

## 2023-02-18 DIAGNOSIS — L80 Vitiligo: Secondary | ICD-10-CM | POA: Diagnosis not present

## 2023-02-18 DIAGNOSIS — D229 Melanocytic nevi, unspecified: Secondary | ICD-10-CM

## 2023-02-18 DIAGNOSIS — L219 Seborrheic dermatitis, unspecified: Secondary | ICD-10-CM

## 2023-02-18 DIAGNOSIS — D1801 Hemangioma of skin and subcutaneous tissue: Secondary | ICD-10-CM

## 2023-02-18 DIAGNOSIS — L814 Other melanin hyperpigmentation: Secondary | ICD-10-CM | POA: Diagnosis not present

## 2023-02-18 DIAGNOSIS — L738 Other specified follicular disorders: Secondary | ICD-10-CM

## 2023-02-18 DIAGNOSIS — L578 Other skin changes due to chronic exposure to nonionizing radiation: Secondary | ICD-10-CM | POA: Diagnosis not present

## 2023-02-18 DIAGNOSIS — L649 Androgenic alopecia, unspecified: Secondary | ICD-10-CM

## 2023-02-18 DIAGNOSIS — M5412 Radiculopathy, cervical region: Secondary | ICD-10-CM

## 2023-02-18 DIAGNOSIS — L910 Hypertrophic scar: Secondary | ICD-10-CM

## 2023-02-18 DIAGNOSIS — Z79899 Other long term (current) drug therapy: Secondary | ICD-10-CM

## 2023-02-18 DIAGNOSIS — L821 Other seborrheic keratosis: Secondary | ICD-10-CM

## 2023-02-18 DIAGNOSIS — W908XXA Exposure to other nonionizing radiation, initial encounter: Secondary | ICD-10-CM

## 2023-02-18 DIAGNOSIS — L858 Other specified epidermal thickening: Secondary | ICD-10-CM

## 2023-02-18 DIAGNOSIS — B001 Herpesviral vesicular dermatitis: Secondary | ICD-10-CM

## 2023-02-18 DIAGNOSIS — M542 Cervicalgia: Secondary | ICD-10-CM | POA: Diagnosis not present

## 2023-02-18 MED ORDER — OPZELURA 1.5 % EX CREA
1.0000 | TOPICAL_CREAM | Freq: Every day | CUTANEOUS | 4 refills | Status: DC
  Filled 2023-02-18: qty 180, 90d supply, fill #0
  Filled 2023-05-24: qty 180, 90d supply, fill #1

## 2023-02-18 MED ORDER — KETOCONAZOLE 2 % EX SHAM
1.0000 | MEDICATED_SHAMPOO | CUTANEOUS | 4 refills | Status: DC
  Filled 2023-02-18: qty 360, 90d supply, fill #0
  Filled 2023-05-20: qty 360, 90d supply, fill #1
  Filled 2023-08-17: qty 360, 90d supply, fill #2
  Filled 2023-11-15: qty 360, 90d supply, fill #3
  Filled 2024-02-13: qty 360, 90d supply, fill #4

## 2023-02-18 MED ORDER — ISOTRETINOIN 20 MG PO CAPS
20.0000 mg | ORAL_CAPSULE | Freq: Two times a day (BID) | ORAL | 0 refills | Status: DC
  Filled 2023-02-18 – 2023-02-21 (×2): qty 60, 30d supply, fill #0

## 2023-02-18 MED ORDER — VALACYCLOVIR HCL 1 G PO TABS
1000.0000 mg | ORAL_TABLET | ORAL | 4 refills | Status: DC
  Filled 2023-02-18: qty 90, 90d supply, fill #0
  Filled 2023-05-24: qty 90, 90d supply, fill #1
  Filled 2023-08-17: qty 90, 90d supply, fill #2
  Filled 2023-11-15: qty 90, 90d supply, fill #3

## 2023-02-18 MED ORDER — MINOXIDIL 2.5 MG PO TABS
5.0000 mg | ORAL_TABLET | Freq: Every day | ORAL | 4 refills | Status: DC
  Filled 2023-02-18: qty 180, 90d supply, fill #0
  Filled 2023-05-24: qty 180, 90d supply, fill #1
  Filled 2023-08-17: qty 180, 90d supply, fill #2
  Filled 2023-11-15: qty 180, 90d supply, fill #3

## 2023-02-18 MED ORDER — ISOTRETINOIN 20 MG PO CAPS
20.0000 mg | ORAL_CAPSULE | Freq: Two times a day (BID) | ORAL | 0 refills | Status: DC
  Filled 2023-02-18: qty 60, 30d supply, fill #0

## 2023-02-18 NOTE — Patient Instructions (Signed)

## 2023-02-18 NOTE — Progress Notes (Signed)
Follow-Up Visit   Subjective  Matthew Kelley is a 62 y.o. male who presents for the following: Skin Cancer Screening and Upper Body Skin Exam, hx of Dysplastic Nevi, Pt would like to restart Isotretinoin for Acne and [facility] face, pt was on in past ~ 8wks, Also needs rfs of meds for vitiligo, Seb Derm, Androgenic alopecia, and cold sores.  The patient presents for Upper Body Skin Exam (UBSE) for skin cancer screening and mole check. The patient has spots, moles and lesions to be evaluated, some may be new or changing and the patient may have concern these could be cancer.    The following portions of the chart were reviewed this encounter and updated as appropriate: medications, allergies, medical history  Review of Systems:  No other skin or systemic complaints except as noted in HPI or Assessment and Plan.  Objective  Well appearing patient in no apparent distress; mood and affect are within normal limits.  All skin waist up examined. Relevant physical exam findings are noted in the Assessment and Plan.    Assessment & Plan    Skin cancer screening performed today.  Actinic Damage - Chronic condition, secondary to cumulative UV/sun exposure - diffuse scaly erythematous macules with underlying dyspigmentation - Recommend daily broad spectrum sunscreen SPF 30+ to sun-exposed areas, reapply every 2 hours as needed.  - Staying in the shade or wearing long sleeves, sun glasses (UVA+UVB protection) and wide brim hats (4-inch brim around the entire circumference of the hat) are also recommended for sun protection.  - Call for new or changing lesions.  Lentigines, Seborrheic Keratoses, Hemangiomas - Benign normal skin lesions - Benign-appearing - Call for any changes  Melanocytic Nevi - Tan-brown and/or pink-flesh-colored symmetric macules and papules - Benign appearing on exam today - Observation - Call clinic for new or changing moles - Recommend daily use of broad spectrum spf  30+ sunscreen to sun-exposed areas.   VITILIGO Bil wrist, post neck, bil axilla  Exam: depigmented patches on bil wrist, post neck, bil axilla  Chronic and persistent condition with duration or expected duration over one year. Condition is improving with treatment but not currently at goal.   Vitiligo is a chronic autoimmune condition which causes loss of skin pigment and is commonly seen on the face and may also involve areas of trauma like hands, elbows, knees, and ankles. There is no cure and it is difficult to treat.  Treatments include topical steroids and other topical anti-inflammatory ointments/creams and topical and oral Jak inhibitors.  Sometimes narrow band UV light therapy or Xtrac laser is helpful, both of which require twice weekly treatments for at least 3-6 months.  Antioxidant vitamins, such as Vitamins A,C,E,D, Folic Acid and B12 may be added to enhance treatment. Heliocare may also enhance treatment results.  Treatment Plan: Cont Opzelura cream bid to white patches    SEBORRHEIC DERMATITIS scalp Exam: Mild scaling at scalp  Chronic condition with duration or expected duration over one year. Currently well-controlled.   Seborrheic Dermatitis is a chronic persistent rash characterized by pinkness and scaling most commonly of the mid face but also can occur on the scalp (dandruff), ears; mid chest, mid back and groin.  It tends to be exacerbated by stress and cooler weather.  People who have neurologic disease may experience new onset or exacerbation of existing seborrheic dermatitis.  The condition is not curable but treatable and can be controlled.  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 Condition is improving with treatment but  not currently at goal.    Androgenetic Alopecia (or Male pattern hair loss) refers to the common patterned hair loss affecting many men.  Male pattern alopecia is mediated by dihydrotestosterone which induces miniaturization of androgen-sensitive hair follicles.  It is chronic and persistent, but treatable; not curable. Topical treatment includes: - 5% topical Minoxidil Oral treatment includes: - Finasteride 1 mg qd - Minoxidil 1.25 - 5 mg qd - Dutasteride 0.5 mg qd Adjunct therapy includes: - Low Level Laser Light Therapy (LLLT) - Platelet-rich Plasma injections (PRP) - Hair Transplantation or scalp reduction  Treatment Plan: Cont Minoxidil 2.5mg  2 po qd  Doses of minoxidil for hair loss are considered 'low dose'. This is because the doses used for hair loss are much lower than the doses which are used for conditions such as high blood pressure (hypertension). The doses used for hypertension are 10-40mg  per day.  Side effects are uncommon at the low doses (up to 2.5 mg/day) used to treat hair loss. Potential side effects, more commonly seen at higher doses, include: Increase in hair growth (hypertrichosis) elsewhere on face and body Temporary hair shedding upon starting medication which may last up to 4 weeks Ankle swelling, fluid retention, rapid weight gain more than 5 pounds Low blood pressure and feeling lightheaded or dizzy when standing up quickly Fast or irregular heartbeat Headaches   Long term medication management.  Patient is using long term (months to years) prescription medication  to control their dermatologic condition.  These medications require periodic monitoring to evaluate for efficacy and side effects and may require periodic laboratory monitoring.    HERPESVIRAL INFECTION (COLD SORES) Exam Clear today  Chronic condition with duration or expected duration over one year. Currently well-controlled.  Herpes Simplex Virus = Cold Sores = Fever Blisters is a chronic  recurring blistering; scabbing sore-producing viral infection that is recurrent usually in the same area triggered by stress, sun/UV exposure and trauma.  It is infectious and can be spread from person to person by direct contact.  It is not curable, but is treatable with topical and oral medication.  Treatment Plan Take Valacyclovir 2 grams every 12 hours for 2 doses with a glass of water at the first sign of symptoms.   HISTORY OF DYSPLASTIC NEVUS No evidence of recurrence today- L scapula lateral, spinal lower back, L paraspinal lower back, R lower back, R mid back Recommend regular full body skin exams Recommend daily broad spectrum sunscreen SPF 30+ to sun-exposed areas, reapply every 2 hours as needed.  Call if any new or changing lesions are noted between office visits    HYPERTROPHIC SCAR chest Exam: Pink firm papules, linear plaque  Benign-appearing.  Call clinic for new or changing lesions. Discussed [place] Kenalog injections  KERATOSIS PILARIS arms - Tiny follicular keratotic papules - Benign. Genetic in nature. No cure. - Observe. - If desired, patient can use an emollient (moisturizer) containing ammonium lactate (AmLactin), urea or salicylic acid once a day to smooth the area  Recommend starting moisturizer with exfoliant (Urea, Salicylic acid, or Lactic acid) one to two times daily to help smooth rough and bumpy skin.  OTC options include Cetaphil Rough and Bumpy lotion (Urea), Eucerin Roughness Relief lotion or spot treatment cream (Urea), CeraVe SA lotion/cream for Rough and  Bumpy skin (Sal Acid), Gold Bond Rough and Bumpy cream (Sal Acid), and AmLactin 12% lotion/cream (Lactic Acid).  If applying in morning, also apply sunscreen to sun-exposed areas, since these exfoliating moisturizers can increase sensitivity to sun.       ACNE VULGARIS with SEBACEOUS HYPERPLASIA face Exam: Small yellow papules with a central dell. Scattered comedones  Chronic and persistent condition  with duration or expected duration over one year. Condition is symptomatic/ bothersome to patient. Not currently at goal.   Treatment Plan: Baseline lab reviewed from 10/24 Pt re-enrolled in [facility] #  [id number] Re Start Isotretinoin 20mg  1 po bid with fatty meal  While taking isotretinoin, do not share pills and do not donate blood. Generic isotretinoin is best absorbed when taken with a fatty meal. Isotretinoin can make you sensitive to the sun. Daily careful sun protection including sunscreen SPF 30+ when outdoors is recommended.      Return in about 1 month (around 03/20/2023) for Isotretinoin.  I, Ardis Rowan, RMA, am acting as scribe for Willeen Niece, MD .   Documentation: I have reviewed the above documentation for accuracy and completeness, and I agree with the above.  Willeen Niece, MD

## 2023-02-19 ENCOUNTER — Other Ambulatory Visit: Payer: Self-pay

## 2023-02-20 ENCOUNTER — Other Ambulatory Visit: Payer: Self-pay

## 2023-02-21 ENCOUNTER — Other Ambulatory Visit: Payer: Self-pay

## 2023-02-22 ENCOUNTER — Inpatient Hospital Stay: Payer: 59

## 2023-02-22 ENCOUNTER — Other Ambulatory Visit: Payer: Self-pay

## 2023-02-22 VITALS — BP 138/91 | HR 76 | Temp 96.1°F

## 2023-02-22 DIAGNOSIS — D509 Iron deficiency anemia, unspecified: Secondary | ICD-10-CM | POA: Diagnosis not present

## 2023-02-22 DIAGNOSIS — E611 Iron deficiency: Secondary | ICD-10-CM

## 2023-02-22 MED ORDER — SODIUM CHLORIDE 0.9% FLUSH
10.0000 mL | Freq: Once | INTRAVENOUS | Status: AC | PRN
  Administered 2023-02-22: 10 mL
  Filled 2023-02-22: qty 10

## 2023-02-22 MED ORDER — IRON SUCROSE 20 MG/ML IV SOLN
200.0000 mg | Freq: Once | INTRAVENOUS | Status: AC
  Administered 2023-02-22: 200 mg via INTRAVENOUS
  Filled 2023-02-22: qty 10

## 2023-02-22 MED ORDER — PRALUENT 150 MG/ML ~~LOC~~ SOAJ
150.0000 mg | SUBCUTANEOUS | 4 refills | Status: DC
  Filled 2023-04-11 – 2023-04-16 (×2): qty 6, 84d supply, fill #0

## 2023-02-25 ENCOUNTER — Other Ambulatory Visit: Payer: Self-pay

## 2023-02-25 DIAGNOSIS — M5413 Radiculopathy, cervicothoracic region: Secondary | ICD-10-CM | POA: Diagnosis not present

## 2023-02-25 DIAGNOSIS — M542 Cervicalgia: Secondary | ICD-10-CM | POA: Diagnosis not present

## 2023-02-26 ENCOUNTER — Other Ambulatory Visit: Payer: Self-pay

## 2023-02-28 ENCOUNTER — Other Ambulatory Visit: Payer: Self-pay

## 2023-03-01 ENCOUNTER — Encounter: Payer: Self-pay | Admitting: Internal Medicine

## 2023-03-01 ENCOUNTER — Ambulatory Visit
Admission: RE | Admit: 2023-03-01 | Discharge: 2023-03-01 | Disposition: A | Discharge status: Home / Self Care | Payer: 59 | Source: Ambulatory Visit | Attending: Neurosurgery | Admitting: Neurosurgery

## 2023-03-01 DIAGNOSIS — M47812 Spondylosis without myelopathy or radiculopathy, cervical region: Secondary | ICD-10-CM | POA: Diagnosis not present

## 2023-03-01 DIAGNOSIS — M4802 Spinal stenosis, cervical region: Secondary | ICD-10-CM | POA: Diagnosis not present

## 2023-03-01 DIAGNOSIS — M5412 Radiculopathy, cervical region: Secondary | ICD-10-CM

## 2023-03-05 ENCOUNTER — Other Ambulatory Visit: Payer: 59

## 2023-03-05 ENCOUNTER — Telehealth: Payer: Self-pay | Admitting: Neurosurgery

## 2023-03-05 ENCOUNTER — Other Ambulatory Visit: Payer: Self-pay | Admitting: Neurosurgery

## 2023-03-05 DIAGNOSIS — M5412 Radiculopathy, cervical region: Secondary | ICD-10-CM

## 2023-03-05 NOTE — Telephone Encounter (Signed)
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  He has bilateral C5-6 neuroforaminal stenosis, which is likely the cause of his left C6 radiculopathy.  He continues to have numbness in his left thumb.  He continues to have episodic pain in his left shoulder blade.  He has been doing physical therapy for the last 4 weeks.  He has had some mild improvement, but continues to be significantly symptomatic.  He has made major changes in his workstation to try to mitigate his symptoms.  At this point, it seems that he has likely maximized conservative management.  1 could consider C5-6 anterior cervical discectomy and fusion versus arthroplasty.  To help make this decision, I would recommend a cervical spine flexion-extension x-ray.  It is likely that he will not be a candidate for arthroplasty, but evaluation of his radiographs will help determine that.  I think it is reasonable to consider moving forward with intervention given his continued symptoms of radiculopathy.  Including his exacerbation earlier this year, he has been symptomatic for approximately 6 months or so.   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  This was previously noted approximately 12 years ago.  I will work to see if we can pour over the images so that our radiology team can review and compare to the present images.

## 2023-03-06 ENCOUNTER — Inpatient Hospital Stay
Admission: RE | Admit: 2023-03-06 | Discharge: 2023-03-06 | Disposition: A | Discharge status: Home / Self Care | Payer: Self-pay | Source: Ambulatory Visit | Attending: Neurosurgery | Admitting: Neurosurgery

## 2023-03-06 ENCOUNTER — Other Ambulatory Visit: Payer: Self-pay | Admitting: Family Medicine

## 2023-03-06 ENCOUNTER — Ambulatory Visit
Admission: RE | Admit: 2023-03-06 | Discharge: 2023-03-06 | Disposition: A | Discharge status: Home / Self Care | Payer: 59 | Source: Ambulatory Visit | Attending: Neurosurgery | Admitting: Neurosurgery

## 2023-03-06 ENCOUNTER — Ambulatory Visit
Admission: RE | Admit: 2023-03-06 | Discharge: 2023-03-06 | Disposition: A | Discharge status: Home / Self Care | Payer: 59 | Attending: Neurosurgery | Admitting: Neurosurgery

## 2023-03-06 DIAGNOSIS — Z049 Encounter for examination and observation for unspecified reason: Secondary | ICD-10-CM

## 2023-03-06 DIAGNOSIS — M5412 Radiculopathy, cervical region: Secondary | ICD-10-CM | POA: Insufficient documentation

## 2023-03-06 DIAGNOSIS — M5030 Other cervical disc degeneration, unspecified cervical region: Secondary | ICD-10-CM | POA: Diagnosis not present

## 2023-03-06 DIAGNOSIS — M47812 Spondylosis without myelopathy or radiculopathy, cervical region: Secondary | ICD-10-CM | POA: Diagnosis not present

## 2023-03-06 DIAGNOSIS — M542 Cervicalgia: Secondary | ICD-10-CM | POA: Diagnosis not present

## 2023-03-06 DIAGNOSIS — M5413 Radiculopathy, cervicothoracic region: Secondary | ICD-10-CM | POA: Diagnosis not present

## 2023-03-06 DIAGNOSIS — M4802 Spinal stenosis, cervical region: Secondary | ICD-10-CM | POA: Diagnosis not present

## 2023-03-06 DIAGNOSIS — M79603 Pain in arm, unspecified: Secondary | ICD-10-CM | POA: Diagnosis not present

## 2023-03-11 DIAGNOSIS — M5413 Radiculopathy, cervicothoracic region: Secondary | ICD-10-CM | POA: Diagnosis not present

## 2023-03-11 DIAGNOSIS — M542 Cervicalgia: Secondary | ICD-10-CM | POA: Diagnosis not present

## 2023-03-14 ENCOUNTER — Telehealth: Payer: Self-pay | Admitting: Neurosurgery

## 2023-03-14 DIAGNOSIS — M5412 Radiculopathy, cervical region: Secondary | ICD-10-CM

## 2023-03-14 NOTE — Telephone Encounter (Signed)
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  He continues to have L thumb numbness and intermittent pain that sometimes causes difficulty and sometimes is tolerable.    He would like to try a course of naproxen and consider an ESI.  I will touch base with him 1-2 weeks after the injection to see how he is doing.  We reviewed warning signs (weakness, worsening pain) that would warrant sooner reevaluation.

## 2023-03-15 DIAGNOSIS — I2510 Atherosclerotic heart disease of native coronary artery without angina pectoris: Secondary | ICD-10-CM | POA: Diagnosis not present

## 2023-03-15 DIAGNOSIS — H35372 Puckering of macula, left eye: Secondary | ICD-10-CM | POA: Diagnosis not present

## 2023-03-15 DIAGNOSIS — H43812 Vitreous degeneration, left eye: Secondary | ICD-10-CM | POA: Diagnosis not present

## 2023-03-15 DIAGNOSIS — H26492 Other secondary cataract, left eye: Secondary | ICD-10-CM | POA: Diagnosis not present

## 2023-03-15 DIAGNOSIS — H35411 Lattice degeneration of retina, right eye: Secondary | ICD-10-CM | POA: Diagnosis not present

## 2023-03-15 DIAGNOSIS — H353132 Nonexudative age-related macular degeneration, bilateral, intermediate dry stage: Secondary | ICD-10-CM | POA: Diagnosis not present

## 2023-03-15 DIAGNOSIS — H04123 Dry eye syndrome of bilateral lacrimal glands: Secondary | ICD-10-CM | POA: Diagnosis not present

## 2023-03-19 ENCOUNTER — Other Ambulatory Visit: Payer: Self-pay

## 2023-03-19 ENCOUNTER — Ambulatory Visit
Admission: RE | Admit: 2023-03-19 | Discharge: 2023-03-19 | Disposition: A | Discharge status: Home / Self Care | Payer: 59 | Source: Ambulatory Visit | Attending: Student in an Organized Health Care Education/Training Program | Admitting: Student in an Organized Health Care Education/Training Program

## 2023-03-19 ENCOUNTER — Ambulatory Visit
Payer: 59 | Attending: Student in an Organized Health Care Education/Training Program | Admitting: Student in an Organized Health Care Education/Training Program

## 2023-03-19 ENCOUNTER — Encounter: Payer: Self-pay | Admitting: Student in an Organized Health Care Education/Training Program

## 2023-03-19 VITALS — BP 164/89 | HR 100 | Temp 99.5°F | Resp 17 | Ht 73.0 in | Wt 200.0 lb

## 2023-03-19 DIAGNOSIS — M5412 Radiculopathy, cervical region: Secondary | ICD-10-CM | POA: Insufficient documentation

## 2023-03-19 DIAGNOSIS — M5020 Other cervical disc displacement, unspecified cervical region: Secondary | ICD-10-CM | POA: Insufficient documentation

## 2023-03-19 MED ORDER — ROPIVACAINE HCL 2 MG/ML IJ SOLN
1.0000 mL | Freq: Once | INTRAMUSCULAR | Status: AC
Start: 2023-03-19 — End: 2023-03-19
  Administered 2023-03-19: 1 mL via EPIDURAL

## 2023-03-19 MED ORDER — DEXAMETHASONE SODIUM PHOSPHATE 10 MG/ML IJ SOLN
10.0000 mg | Freq: Once | INTRAMUSCULAR | Status: AC
  Administered 2023-03-19: 10 mg

## 2023-03-19 MED ORDER — SODIUM CHLORIDE 0.9% FLUSH
1.0000 mL | Freq: Once | INTRAVENOUS | Status: AC
  Administered 2023-03-19: 1 mL

## 2023-03-19 MED ORDER — GABAPENTIN 100 MG PO CAPS
100.0000 mg | ORAL_CAPSULE | Freq: Every day | ORAL | 1 refills | Status: DC
  Filled 2023-03-19 (×2): qty 90, 30d supply, fill #0

## 2023-03-19 MED ORDER — IOHEXOL 180 MG/ML  SOLN
10.0000 mL | Freq: Once | INTRAMUSCULAR | Status: AC
  Administered 2023-03-19: 10 mL via EPIDURAL

## 2023-03-19 MED ORDER — LIDOCAINE HCL 2 % IJ SOLN
20.0000 mL | Freq: Once | INTRAMUSCULAR | Status: AC
  Administered 2023-03-19: 400 mg

## 2023-03-19 MED ORDER — SODIUM CHLORIDE (PF) 0.9 % IJ SOLN
INTRAMUSCULAR | Status: AC
Start: 2023-03-19 — End: ?
  Filled 2023-03-19: qty 10

## 2023-03-19 MED ORDER — IOHEXOL 180 MG/ML  SOLN
INTRAMUSCULAR | Status: AC
  Filled 2023-03-19: qty 20

## 2023-03-19 MED ORDER — ROPIVACAINE HCL 2 MG/ML IJ SOLN
INTRAMUSCULAR | Status: AC
  Filled 2023-03-19: qty 20

## 2023-03-19 MED ORDER — LIDOCAINE HCL 2 % IJ SOLN
INTRAMUSCULAR | Status: AC
Start: 2023-03-19 — End: ?
  Filled 2023-03-19: qty 20

## 2023-03-19 MED ORDER — DEXAMETHASONE SODIUM PHOSPHATE 10 MG/ML IJ SOLN
INTRAMUSCULAR | Status: AC
  Filled 2023-03-19: qty 1

## 2023-03-19 NOTE — Patient Instructions (Addendum)
____________________________________________________________________________________________  Initial Gabapentin Titration  Medication used: Gabapentin (Generic Name) or Neurontin (Brand Name) 100 mg tablets/capsules  Note:  1. It takes 2-3 weeks of taking the medication regularly to reach steady levels. 2. Medication needs to be taken regularly in order for it to work. The medication is meant to prevent pain and therefore, taking it only when you think you need it will not work. 3. Medication is meant to prevent sharp shooting pains, electrical-like sensations, deep aching pain, and burning sensation associated with neuropathic/neurogenic pain.  Reasons to stop increasing the dose:  Reason 1: You get good relief of symptoms, in which case there is no need to increase the daily dose any further.    Reason 2: You develop some side effects, such as sleeping all of the time, difficulty concentrating, or becoming disoriented, in which case you need to go down on the dose, to the prior level, where you were not experiencing any side effects. Stay on that dose longer, to allow more time for your body to get use it, before attempting to increase it again.    Endpoint: Once you have reached the maximum dose you can tolerate without side-effects, contact your physician so as to evaluate the results of the regimen.     Questions: Feel free to contact us for any questions or problems at [phone]   Steps: Step 1: Start by taking 1 (one) tablet at bedtime for at least 7-14 days.  Step 2: After being on 1 (one) tablet for 7-14 days, then increase it to 2 (two) tablets at bedtime for another  7-14 days.  Step 3: Next, after being on 2 (two) tablets at bedtime for  7-14 days, then increase it to 3 (three) tablets at bedtime, and stay on that dose until you see your doctor.  ____________________________________________________________________________________________Epidural Steroid Injection  An  epidural steroid injection is a shot of steroid medicine, also called cortisone, and a numbing medicine that is given into the epidural space. This space is between the spinal cord and the bones of the back. This shot helps relieve pain caused by an irritated or swollen nerve root. The pain relief you get from the injection depends on the cause of your condition and how long your pain lasts. You may have a period of slightly more pain after your injection, before the steroid medicine takes effect. This medicine usually starts working within 1-3 days. In some cases, you might need 7-10 days to feel the full effect. Tell your health care provider about: Any allergies you have. All medicines you are taking, including vitamins, herbs, eye drops, creams, and over-the-counter medicines. Any problems you or family members have had with anesthesia. Any bleeding problems you have. Any surgeries you have had. Any medical conditions you have. Whether you are pregnant or may be pregnant. What are the risks? Your health care provider will talk with you about risks. These may include: Headache. Bleeding. Infection. Allergic reaction to medicines or dyes. Nerve damage. Not being able to move (paralysis). This is rare. What happens before the procedure? Medicines You may be given medicines to lower anxiety. Ask your provider about: Changing or stopping your regular medicines. These include any diabetes medicines or blood thinners you take. Taking medicines such as aspirin and ibuprofen. These medicines can thin your blood. Do not take them unless your provider tells you to. Taking over-the-counter medicines, vitamins, herbs, and supplements. General instructions Follow instructions from your provider about what you may eat and drink.  Ask your provider what steps will be taken to help prevent infection. If you will be going home right after the procedure, plan to have a responsible adult: Take you home  from the hospital or clinic. You will not be allowed to drive. Care for you for the time you are told. What happens during the procedure?  An IV will be inserted into one of your veins. You may be given a sedative to help you relax. You will be asked to sit or lie on your side. The injection site will be cleaned. An X-ray machine will be used to guide the needle close to the nerve that is causing pain. A needle will be put through your skin into the epidural space. This may cause you some discomfort. Contrast dye may be injected at the site to make sure that the steroid medicine will be sent to the exact place it needs to go. The steroid medicine and a numbing medicine will be injected into the epidural space for pain relief. The needle will be removed. A bandage (dressing) will be put over the injection site. The procedure may vary among providers and hospitals. What happens after the procedure? Your blood pressure, heart rate, breathing rate, and blood oxygen level will be monitored until you leave the hospital or clinic. Your IV will be removed. Your arm or leg may feel weak or numb for a few hours. This information is not intended to replace advice given to you by your health care provider. Make sure you discuss any questions you have with your health care provider. Document Revised: 11/03/2021 Document Reviewed: 11/03/2021 [employer] Patient Education  2024 [employer] Post-Procedure Discharge Instructions  Instructions: Apply ice:  Purpose: This will minimize any swelling and discomfort after procedure.  When: Day of procedure, as soon as you get home. How: Fill a plastic sandwich bag with crushed ice. Cover it with a small towel and apply to injection site. How long: (15 min on, 15 min off) Apply for 15 minutes then remove x 15 minutes.  Repeat sequence on day of procedure, until you go to bed. Apply heat:  Purpose: To treat any soreness and discomfort from the  procedure. When: Starting the next day after the procedure. How: Apply heat to procedure site starting the day following the procedure. How long: May continue to repeat daily, until discomfort goes away. Food intake: Start with clear liquids (like water) and advance to regular food, as tolerated.  Physical activities: Keep activities to a minimum for the first 8 hours after the procedure. After that, then as tolerated. Driving: If you have received any sedation, be responsible and do not drive. You are not allowed to drive for 24 hours after having sedation. Blood thinner: (Applies only to those taking blood thinners) You may restart your blood thinner 6 hours after your procedure. Insulin: (Applies only to Diabetic patients taking insulin) As soon as you can eat, you may resume your normal dosing schedule. Infection prevention: Keep procedure site clean and dry. Shower daily and clean area with soap and water. Post-procedure Pain Diary: Extremely important that this be done correctly and accurately. Recorded information will be used to determine the next step in treatment. For the purpose of accuracy, follow these rules: Evaluate only the area treated. Do not report or include pain from an untreated area. For the purpose of this evaluation, ignore all other areas of pain, except for the treated area. After your procedure, avoid taking a long nap and attempting  to complete the pain diary after you wake up. Instead, set your alarm clock to go off every hour, on the hour, for the initial 8 hours after the procedure. Document the duration of the numbing medicine, and the relief you are getting from it. Do not go to sleep and attempt to complete it later. It will not be accurate. If you received sedation, it is likely that you were given a medication that may cause amnesia. Because of this, completing the diary at a later time may cause the information to be inaccurate. This information is needed to plan  your care. Follow-up appointment: Keep your post-procedure follow-up evaluation appointment after the procedure (usually 2 weeks for most procedures, 6 weeks for radiofrequencies). DO NOT FORGET to bring you pain diary with you.   Expect: (What should I expect to see with my procedure?) From numbing medicine (AKA: Local Anesthetics): Numbness or decrease in pain. You may also experience some weakness, which if present, could last for the duration of the local anesthetic. Onset: Full effect within 15 minutes of injected. Duration: It will depend on the type of local anesthetic used. On the average, 1 to 8 hours.  From steroids (Applies only if steroids were used): Decrease in swelling or inflammation. Once inflammation is improved, relief of the pain will follow. Onset of benefits: Depends on the amount of swelling present. The more swelling, the longer it will take for the benefits to be seen. In some cases, up to 10 days. Duration: Steroids will stay in the system x 2 weeks. Duration of benefits will depend on multiple posibilities including persistent irritating factors. Side-effects: If present, they may typically last 2 weeks (the duration of the steroids). Frequent: Cramps (if they occur, drink Gatorade and take over-the-counter Magnesium 450-500 mg once to twice a day); water retention with temporary weight gain; increases in blood sugar; decreased immune system response; increased appetite. Occasional: Facial flushing (red, warm cheeks); mood swings; menstrual changes. Uncommon: Long-term decrease or suppression of natural hormones; bone thinning. (These are more common with higher doses or more frequent use. This is why we prefer that our patients avoid having any injection therapies in other practices.)  Very Rare: Severe mood changes; psychosis; aseptic necrosis. From procedure: Some discomfort is to be expected once the numbing medicine wears off. This should be minimal if ice and heat are  applied as instructed.  Call if: (When should I call?) You experience numbness and weakness that gets worse with time, as opposed to wearing off. New onset bowel or bladder incontinence. (Applies only to procedures done in the spine)  Emergency Numbers: Durning business hours (Monday - Thursday, 8:00 AM - 4:00 PM) (Friday, 9:00 AM - 12:00 Noon): [phone] After hours: [phone] NOTE: If you are having a problem and are unable connect with, or to talk to a provider, then go to your nearest urgent care or emergency department. If the problem is serious and urgent, please call 911.

## 2023-03-19 NOTE — Progress Notes (Signed)
Patient: Matthew Kelley  Service Category: E/M  Provider: Edward Jolly, MD  DOB: 1960-06-09  DOS: 03/19/2023  Referring Provider: Venetia Night, MD  MRN: [id number]  Setting: Ambulatory outpatient  PCP: Matthew [place], MD  Type: New Patient  Specialty: Interventional Pain Management    Location: Office  Delivery: Face-to-face     Primary Reason(s) for Visit: Encounter for initial evaluation of one or more chronic problems (new to examiner) potentially causing chronic pain, and posing a threat to normal musculoskeletal function. (Level of risk: High) CC: Neck Pain (> on the left)  HPI  Matthew Kelley is a 62 y.o. year old, male patient, who comes for the first time to our practice referred by Matthew Night, MD for our initial evaluation of his chronic pain. He has Obstructive sleep apnea; THYROID CANCER, HX OF; Hypercholesterolemia; Hemangioma; Right hip pain; Low testosterone; Detached retina; Health care maintenance; Low back pain; Anemia; Acute ST elevation myocardial infarction (STEMI) involving left anterior descending (LAD) coronary artery [facility]); STEMI (ST elevation myocardial infarction) (HCC); Elevated serum creatinine; OSA (obstructive sleep apnea); History of thyroidectomy; HLD (hyperlipidemia); Coronary artery disease with angina pectoris (HCC); Iron deficiency; Tick bite; Neck pain; Cervical radicular pain (left C6); and Cervical disc herniation on their problem list. Today he comes in for evaluation of his Neck Pain (> on the left)  Pain Assessment: Location: Left, Right Neck Radiating: left and right arm Onset: More than a month ago Duration: Chronic pain Quality: Squeezing, Numbness, Sharp, Stabbing, Burning, Pins and needles Severity: 7 /10 (subjective, self-reported pain score)  Effect on ADL:   Timing: Intermittent Modifying factors: changing head position BP: (!) 164/89  HR: 100  Onset and Duration: Gradual Cause of pain: Work related accident or event Severity:  Getting worse Timing: Not influenced by the time of the day Aggravating Factors: Bending, Lifiting, and Motion Alleviating Factors: Stretching, Cold packs, Hot packs, and Resting Associated Problems: Pain that does not allow patient to sleep Quality of Pain: Annoying, Constant, Numb, and Tender Previous Examinations or Tests: X-rays Previous Treatments: Stretching exercises  Matthew Kelley is being evaluated for possible interventional pain management therapies for the treatment of his chronic pain.    History of Present Illness   The patient presents with symptoms suggestive of LEFT C6 radiculopathy. He reports persistent pain that wakes him up at Kelley, radiating down his left arm, necessitating changes in position for relief. Accompanying this, he has experienced constant numbness in the thumb for the past two to three weeks,  The numbness is more pronounced in the thumb than the index finger. 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  The patient has undergone a cervical MRI, revealing stenosis and impingement on both sides (see results below). He has had discussions with a neurosurgeon about potential surgical interventions, including the placement of an artificial disc. However, he expresses a preference for less drastic measures if possible. He has been participating in physical therapy twice a week, but reports no significant improvement in his overall condition.  The patient has not tried any neuropathic medications such as gabapentin or pregabalin. He expresses a desire for pain relief, as his current symptoms are affecting his sleep and daily activities. He is concerned about potential ongoing nerve damage and is considering whether surgery might be a more effective long-term solution.        Meds   Current Outpatient Medications:  Alirocumab (PRALUENT)  150 MG/ML SOAJ, Inject 150 mg into the skin every 14 (fourteen) days., Disp: 6 mL, Rfl: 4   Alirocumab (PRALUENT) 150 MG/ML SOAJ, Inject 1 mL (150 mg total) into the skin every 14 (fourteen) days., Disp: 6 mL, Rfl: 4   aspirin 81 MG chewable tablet, Chew 1 tablet (81 mg total) by mouth daily., Disp: 30 tablet, Rfl: 2   Cholecalciferol (VITAMIN D) 50 MCG (2000 UT) tablet, Take 2,000 Units by mouth daily., Disp: , Rfl:    cycloSPORINE (RESTASIS) 0.05 % ophthalmic emulsion, Place 1 drop into both eyes 2 (two) times daily., Disp: 180 each, Rfl: 3   gabapentin (NEURONTIN) 100 MG capsule, Take 1-3 capsules (100-300 mg total) by mouth at bedtime. Follow written titration schedule., Disp: 90 capsule, Rfl: 1   ketoconazole (NIZORAL) 2 % shampoo, Apply 1 Application topically 3 (three) times a week. Wash scalp 3 times weekly, let sit 5 minutes and rinse out, Disp: 360 mL, Rfl: 4   levothyroxine (SYNTHROID) 175 MCG tablet, Take 175 mcg by mouth daily before breakfast., Disp: , Rfl:    minoxidil (LONITEN) 2.5 MG tablet, Take 2 tablets (5 mg total) by mouth daily., Disp: 180 tablet, Rfl: 4   Multiple Vitamins-Minerals (PRESERVISION AREDS 2) CAPS, , Disp: , Rfl:    Ruxolitinib Phosphate (OPZELURA) 1.5 % CREA, Apply 1 Application topically daily to areas of vitiligo on body, Disp: 180 g, Rfl: 4   testosterone cypionate (DEPOTESTOSTERONE CYPIONATE) 200 MG/ML injection, Inject 1 mL (200 mg total) into the muscle every 14 (fourteen) days., Disp: 10 mL, Rfl: 0   valACYclovir (VALTREX) 1000 MG tablet, Take 1 tablet (1,000 mg total) by mouth as directed. Take 2 tablets at onset of fever blister symptoms then 2 tablets 12 hours later, Disp: 90 tablet, Rfl: 4   atorvastatin (LIPITOR) 10 MG tablet, Take 1 tablet (10 mg total) by mouth once daily (Patient not taking: Reported on 03/19/2023), Disp: 30 tablet, Rfl: 11   ISOtretinoin (ACCUTANE) 20 MG capsule, Take 1 capsule (20 mg total) by mouth 2 (two) times daily. (Patient not  taking: Reported on 03/19/2023), Disp: 60 capsule, Rfl: 0  Imaging Review  Cervical Imaging: Cervical MR wo contrast: Results for orders placed during the hospital encounter of 03/01/23  MR CERVICAL SPINE WO CONTRAST  Addendum 03/14/2023 10:57 AM ADDENDUM REPORT: 03/14/2023 10:54  ADDENDUM: Outside prior imaging was obtained for comparison. Outside MRI of the head/face and contrast-enhanced MRA of the neck dated 12/01/2010 demonstrates a trans-spatial, T2 hyperintense lesion centered within the right oropharynx and prestyloid parapharyngeal space, measuring up to 4.3 x 2.5 cm. There are dilated veins along the lesion, but no significant arterial component on MRA. 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   Electronically Signed By: Orvan Falconer M.D. On: 03/14/2023 10:54  Narrative CLINICAL DATA:  Cervical radiculopathy. Neck pain radiating to the left shoulder and arm with associated numbness.  EXAM: MRI CERVICAL SPINE WITHOUT CONTRAST  TECHNIQUE: Multiplanar, multisequence MR imaging of the cervical spine was performed. No intravenous contrast was administered.  COMPARISON:  None Available.  FINDINGS: Alignment: Normal.  Vertebrae: No fracture, evidence of discitis, or bone lesion.  Cord: Normal spinal cord signal and volume.  Posterior Fossa, vertebral arteries, paraspinal tissues: Partially imaged T2 hyperintense lesion in the pharynx along the right aspect of the soft palate, measuring up to 4.4 cm.  Disc levels:  C2-C3: Left-greater-than-right facet arthropathy and  uncovertebral joint spurring results in moderate left neural foraminal narrowing.  C3-C4: Small disc bulge and mild bilateral facet arthropathy. No spinal canal stenosis or neural foraminal narrowing.  C4-C5: Small disc bulge without spinal canal stenosis. Right-greater-than-left facet  arthropathy and uncovertebral joint spurring results in moderate right neural foraminal narrowing.  C5-C6: Disc bulge and ligamentum flavum buckling results in moderate spinal canal stenosis. Facet arthropathy and uncovertebral joint spurring contribute to severe bilateral neural foraminal narrowing.  C6-C7:  Normal.  C7-T1:  Normal.  IMPRESSION: 1. Multilevel cervical spondylosis, worst at C5-C6, where there is moderate spinal canal stenosis and severe bilateral neural foraminal narrowing. 2. Moderate left neural foraminal narrowing at C2-C3 and moderate right neural foraminal narrowing at C4-C5. 3. Partially imaged T2 hyperintense lesion in the pharynx along the right aspect of the soft palate, measuring up to 4.4 cm. Recommend further evaluation with direct visualization and contrast-enhanced neck CT.  Electronically Signed: By: Orvan Falconer M.D. On: 03/05/2023 14:59  MR Lumbar Spine Wo Contrast  Narrative CLINICAL DATA:  Left-sided low back pain with sciatica. Persistent back pain with x-ray revealing minimal compression of L1  EXAM: MRI LUMBAR SPINE WITHOUT CONTRAST  TECHNIQUE: Multiplanar, multisequence MR imaging of the lumbar spine was performed. No intravenous contrast was administered.  COMPARISON:  Radiographs 08/10/2016  FINDINGS: Segmentation:  Normal  Alignment:  Normal.  Vertebrae: Negative for acute or chronic fracture. Negative for mass lesion. Normal bone marrow. No compression of L1 identified  Conus medullaris: Extends to the T12-L1 level and appears normal.  Paraspinal and other soft tissues: Negative  Disc levels:  L1-2:  Negative  L2-3:  Negative  L3-4: Mild disc degeneration and disc bulging. Small right extraforaminal disc protrusion with mild displacement of the L3 nerve root lateral to the foramen. Mild facet and ligamentum flavum hypertrophy. Mild narrowing of the spinal canal  L4-5: Mild disc space narrowing and disc  bulging. Small extraforaminal disc protrusion on the left with displacement of the left L4 nerve root. Moderate facet and ligamentum flavum hypertrophy bilaterally with mild spinal stenosis.  L5-S1:  Mild facet degeneration without stenosis.  IMPRESSION: Small right extraforaminal disc protrusion L3-4  Mild spinal stenosis L4-5. Small extraforaminal disc protrusion on the left.   Electronically Signed By: Marlan Palau M.D. On: 08/23/2016 08:18  DG Lumbar Spine 2-3 Views  Narrative CLINICAL DATA:  Low back pain.  Left-sided sciatica.  EXAM: LUMBAR SPINE - 2-3 VIEW  COMPARISON:  PET-CT 08/14/2012 .  FINDINGS: Mild scoliosis concave left. Diffuse multilevel mild degenerative change. Minimal compression of L1 cannot be excluded. Surgical clips right upper quadrant. Aortoiliac atherosclerotic vascular calcification . Stool noted throughout the colon.  IMPRESSION: 1.  Minimal compression of L1 cannot be excluded.  2. Mild scoliosis concave left. Diffuse multilevel mild degenerative change.  3. Aortoiliac atherosclerotic vascular disease .   Electronically Signed By: Maisie Fus  Register On: 08/10/2016 09:43   Complexity Note: Imaging results reviewed.                         ROS  Cardiovascular: Heart surgery Pulmonary or Respiratory: No reported pulmonary signs or symptoms such as wheezing and difficulty taking a deep full breath (Asthma), difficulty blowing air out (Emphysema), coughing up mucus (Bronchitis), persistent dry cough, or temporary stoppage of breathing during sleep Neurological: No reported neurological signs or symptoms such as seizures, abnormal skin sensations, urinary and/or fecal incontinence, being born with an abnormal open spine and/or a tethered  spinal cord Psychological-Psychiatric: No reported psychological or psychiatric signs or symptoms such as difficulty sleeping, anxiety, depression, delusions or hallucinations (schizophrenial), mood  swings (bipolar disorders) or suicidal ideations or attempts Gastrointestinal: No reported gastrointestinal signs or symptoms such as vomiting or evacuating blood, reflux, heartburn, alternating episodes of diarrhea and constipation, inflamed or scarred liver, or pancreas or irrregular and/or infrequent bowel movements Genitourinary: No reported renal or genitourinary signs or symptoms such as difficulty voiding or producing urine, peeing blood, non-functioning kidney, kidney stones, difficulty emptying the bladder, difficulty controlling the flow of urine, or chronic kidney disease Hematological: No reported hematological signs or symptoms such as prolonged bleeding, low or poor functioning platelets, bruising or bleeding easily, hereditary bleeding problems, low energy levels due to low hemoglobin or being anemic Endocrine: Slow thyroid Rheumatologic: No reported rheumatological signs and symptoms such as fatigue, joint pain, tenderness, swelling, redness, heat, stiffness, decreased range of motion, with or without associated rash Musculoskeletal: Negative for myasthenia gravis, muscular dystrophy, multiple sclerosis or malignant hyperthermia Work History: Working full time  Allergies  Mr. Sebring has No Known Allergies.  Laboratory Chemistry Profile   Renal Lab Results  Component Value Date   BUN 19 01/25/2023   CREATININE 1.17 01/25/2023   BCR 20 02/28/2018   GFR 65.95 01/25/2023   GFRAA 78 01/21/2019   GFRNONAA >60 12/14/2022   SPECGRAV 1.023 06/11/2022   PHUR 5.5 06/11/2022   PROTEINUR Negative 06/11/2022     Electrolytes Lab Results  Component Value Date   NA 141 01/25/2023   K 4.0 01/25/2023   CL 104 01/25/2023   CALCIUM 8.8 01/25/2023   MG 2.3 06/18/2020     Hepatic Lab Results  Component Value Date   AST 16 01/25/2023   ALT 18 01/25/2023   ALBUMIN 4.3 01/25/2023   ALKPHOS 68 01/25/2023     ID Lab Results  Component Value Date   HIV NON-REACTIVE 03/23/2022    SARSCOV2NAA NEGATIVE 06/16/2020   MRSAPCR NEGATIVE 06/17/2020     Bone Lab Results  Component Value Date   VD25OH 32.73 04/24/2019   TESTOFREE 5.3 (L) 10/01/2022   TESTOSTERONE 901 01/28/2023     Endocrine Lab Results  Component Value Date   GLUCOSE 100 (H) 01/25/2023   GLUCOSEU Negative 06/11/2022   HGBA1C 5.7 09/04/2019   TSH 3.55 01/25/2023   FREET4 1.01 01/25/2023   TESTOFREE 5.3 (L) 10/01/2022   TESTOSTERONE 901 01/28/2023     Neuropathy Lab Results  Component Value Date   VITAMINB12 654 08/12/2020   HGBA1C 5.7 09/04/2019   HIV NON-REACTIVE 03/23/2022     CNS No results found for: "COLORCSF", "APPEARCSF", "RBCCOUNTCSF", "WBCCSF", "POLYSCSF", "LYMPHSCSF", "EOSCSF", "PROTEINCSF", "GLUCCSF", "JCVIRUS", "CSFOLI", "IGGCSF", "LABACHR", "ACETBL"   Inflammation (CRP: Acute  ESR: Chronic) Lab Results  Component Value Date   CRP <1 06/11/2022     Rheumatology No results found for: "RF", "ANA", "LABURIC", "URICUR", "LYMEIGGIGMAB", "LYMEABIGMQN", "HLAB27"   Coagulation Lab Results  Component Value Date   INR 1.0 06/16/2020   LABPROT 12.7 06/16/2020   APTT 27 06/16/2020   PLT 234 02/11/2023     Cardiovascular Lab Results  Component Value Date   CKTOTAL 90 06/11/2022   HGB 13.1 02/11/2023   HCT 39.3 02/11/2023     Screening Lab Results  Component Value Date   SARSCOV2NAA NEGATIVE 06/16/2020   MRSAPCR NEGATIVE 06/17/2020   HIV NON-REACTIVE 03/23/2022     Cancer No results found for: "CEA", "CA125", "LABCA2"   Allergens No results found for: "ALMOND", "  APPLE", "ASPARAGUS", "AVOCADO", "BANANA", "BARLEY", "BASIL", "BAYLEAF", "GREENBEAN", "LIMABEAN", "WHITEBEAN", "BEEFIGE", "REDBEET", "BLUEBERRY", "BROCCOLI", "CABBAGE", "MELON", "CARROT", "CASEIN", "CASHEWNUT", "CAULIFLOWER", "CELERY"     Note: Lab results reviewed.  PFSH  Drug: Mr. Wiltgen  reports no history of drug use. Alcohol:  reports current alcohol use. Tobacco:  reports that he has never smoked.  He has never used smokeless tobacco. Medical:  has a past medical history of Atypical nevus (09/07/2008), Blood in stool, Cancer (HCC), Dysplastic nevus (12/20/2009), Dysplastic nevus (12/20/2009), Dysplastic nevus (11/24/2018), Dysplastic nevus (11/24/2018), H/O total thyroidectomy, History of chicken pox, Hypercholesterolemia, Hypothyroidism, Sleep apnea, Thyroid cancer (HCC), and Vertigo. Family: family history includes Heart disease in his father; Hyperlipidemia in his father.  Past Surgical History:  Procedure Laterality Date   CHOLECYSTECTOMY  2000   CORONARY/GRAFT ACUTE MI REVASCULARIZATION N/A 06/16/2020   Procedure: Coronary/Graft Acute MI Revascularization;  Surgeon: Marcina Millard, MD;  Location: [facility] INVASIVE CV LAB;  Service: Cardiovascular;  Laterality: N/A;   LEFT HEART CATH AND CORONARY ANGIOGRAPHY N/A 06/16/2020   Procedure: LEFT HEART CATH AND CORONARY ANGIOGRAPHY;  Surgeon: Marcina Millard, MD;  Location: [facility] INVASIVE CV LAB;  Service: Cardiovascular;  Laterality: N/A;   TOTAL THYROIDECTOMY  2009   Active Ambulatory Problems    Diagnosis Date Noted   Obstructive sleep apnea 06/10/2009   THYROID CANCER, HX OF 06/10/2009   Hypercholesterolemia 08/01/2013   Hemangioma 08/01/2013   Right hip pain 08/01/2013   Low testosterone 08/01/2013   Detached retina 08/01/2013   Health care maintenance 07/30/2014   Low back pain 08/19/2016   Anemia 12/19/2018   Acute ST elevation myocardial infarction (STEMI) involving left anterior descending (LAD) coronary artery (HCC) 06/16/2020   STEMI (ST elevation myocardial infarction) (HCC) 06/16/2020   Elevated serum creatinine 06/17/2020   OSA (obstructive sleep apnea) 06/17/2020   History of thyroidectomy 06/17/2020   HLD (hyperlipidemia) 06/17/2020   Coronary artery disease with angina pectoris (HCC) 08/12/2020   Iron deficiency 01/20/2021   Tick bite 07/20/2022   Neck pain 01/26/2023   Cervical radicular pain (left C6)  03/19/2023   Cervical disc herniation 03/19/2023   Resolved Ambulatory Problems    Diagnosis Date Noted   No Resolved Ambulatory Problems   Past Medical History:  Diagnosis Date   Atypical nevus 09/07/2008   Blood in stool    Cancer (HCC)    Dysplastic nevus 12/20/2009   Dysplastic nevus 12/20/2009   Dysplastic nevus 11/24/2018   Dysplastic nevus 11/24/2018   H/O total thyroidectomy    History of chicken pox    Hypothyroidism    Sleep apnea    Thyroid cancer (HCC)    Vertigo    Constitutional Exam  General appearance: Well nourished, well developed, and well hydrated. In no apparent acute distress Vitals:   03/19/23 1405 03/19/23 1440 03/19/23 1445 03/19/23 1450  BP: (!) 172/84 (!) 166/92 (!) 165/90 (!) 164/89  Pulse: 99 100 99 100  Resp: 17 18 16 17   Temp: 99.5 F (37.5 C)     TempSrc: Temporal     SpO2: 100% 100% 99% 100%  Weight: 200 lb (90.7 kg)     Height: 6\' 1"  (1.854 m)      BMI Assessment: Estimated body mass index is 26.39 kg/m as calculated from the following:   Height as of this encounter: 6\' 1"  (1.854 m).   Weight as of this encounter: 200 lb (90.7 kg).  BMI interpretation table: BMI level Category Range association with higher incidence of chronic pain  <18  kg/m2 Underweight   18.5-24.9 kg/m2 Ideal body weight   25-29.9 kg/m2 Overweight Increased incidence by 20%  30-34.9 kg/m2 Obese (Class I) Increased incidence by 68%  35-39.9 kg/m2 Severe obesity (Class II) Increased incidence by 136%  >40 kg/m2 Extreme obesity (Class III) Increased incidence by 254%   Patient's current BMI Ideal Body weight  Body mass index is 26.39 kg/m. Ideal body weight: 79.9 kg (176 lb 2.4 oz) Adjusted ideal body weight: 84.2 kg (185 lb 11 oz)   BMI Readings from Last 4 Encounters:  03/19/23 26.39 kg/m  02/01/23 26.65 kg/m  01/25/23 27.31 kg/m  12/14/22 26.60 kg/m   Wt Readings from Last 4 Encounters:  03/19/23 200 lb (90.7 kg)  02/01/23 202 lb (91.6 kg)   01/25/23 207 lb (93.9 kg)  12/14/22 201 lb 9.6 oz (91.4 kg)    Psych/Mental status: Alert, oriented x 3 (person, place, & time)       Eyes: PERLA Respiratory: No evidence of acute respiratory distress  Cervical Spine Area Exam  Skin & Axial Inspection: No masses, redness, edema, swelling, or associated skin lesions Alignment: Symmetrical Functional ROM: Unrestricted ROM      Stability: No instability detected Muscle Tone/Strength: Functionally intact. No obvious neuro-muscular anomalies detected. Sensory (Neurological): Dermatomal pain pattern Palpation: No palpable anomalies             Upper Extremity (UE) Exam    Side: Right upper extremity  Side: Left upper extremity  Skin & Extremity Inspection: Skin color, temperature, and hair growth are WNL. No peripheral edema or cyanosis. No masses, redness, swelling, asymmetry, or associated skin lesions. No contractures.  Skin & Extremity Inspection: Skin color, temperature, and hair growth are WNL. No peripheral edema or cyanosis. No masses, redness, swelling, asymmetry, or associated skin lesions. No contractures.  Functional ROM: Unrestricted ROM          Functional ROM: Pain restricted ROM          Muscle Tone/Strength: Functionally intact. No obvious neuro-muscular anomalies detected.  Muscle Tone/Strength: Functionally intact. No obvious neuro-muscular anomalies detected.  Sensory (Neurological): Unimpaired          Sensory (Neurological): Dermatomal pain pattern          Palpation: No palpable anomalies              Palpation: No palpable anomalies              Provocative Test(s):  Phalen's test: deferred Tinel's test: deferred Apley's scratch test (touch opposite shoulder):  Action 1 (Across chest): deferred Action 2 (Overhead): deferred Action 3 (LB reach): deferred   Provocative Test(s):  Phalen's test: deferred Tinel's test: deferred Apley's scratch test (touch opposite shoulder):  Action 1 (Across chest): deferred Action  2 (Overhead): deferred Action 3 (LB reach): deferred     Assessment  Primary Diagnosis & Pertinent Problem List: The primary encounter diagnosis was Cervical radicular pain (left C6). A diagnosis of Cervical disc herniation was also pertinent to this visit.  Visit Diagnosis (New problems to examiner): 1. Cervical radicular pain (left C6)   2. Cervical disc herniation    Plan of Care (Initial workup plan)  Note: Mr. Hladky was reminded that as per protocol, today's visit has been an evaluation only. We have not taken over the patient's controlled substance management.  Problem-specific plan: Assessment and Plan    C6 Radiculopathy   He presents with C6 radiculopathy characterized by nocturnal pain, radiating arm pain, and thumb numbness persisting for 2-3  weeks. MRI findings indicate stenosis and impingement at C5-C6. Although his sleep is affected, fine motor control remains intact. We discussed the cervical epidural steroid injection, highlighting its potential to reduce inflammation and pain without altering anatomy or relieving numbness. We noted risks such as spinal cord, blood vessel, nerve injury, and infection. The injection, comprising 1 cc saline, 1 cc ropivacaine, and 1 cc Decadron 10 mg at C7-T1, serves both diagnostic and therapeutic purposes. We will reevaluate in 4 weeks to determine the injection's efficacy and consider repetition or surgical decompression for motor deficits or symptom exacerbation. Neuropathic medications like gabapentin or pregabalin may be introduced as needed.  General Health Maintenance   We emphasized the importance of monitoring skin lesions and have scheduled a dermatology evaluation to ensure comprehensive health maintenance.      See attached procedure note  Imaging Orders         DG PAIN CLINIC C-ARM 1-60 MIN NO REPORT      Pharmacotherapy (current): Medications ordered:  Meds ordered this encounter  Medications   iohexol (OMNIPAQUE)  180 MG/ML injection 10 mL    Must be Myelogram-compatible. If not available, you may substitute with a water-soluble, non-ionic, hypoallergenic, myelogram-compatible radiological contrast medium.   lidocaine (XYLOCAINE) 2 % (with pres) injection 400 mg   sodium chloride flush (NS) 0.9 % injection 1 mL   ropivacaine (PF) 2 mg/mL (0.2%) (NAROPIN) injection 1 mL   dexamethasone (DECADRON) injection 10 mg   gabapentin (NEURONTIN) 100 MG capsule    Sig: Take 1-3 capsules (100-300 mg total) by mouth at bedtime. Follow written titration schedule.    Dispense:  90 capsule    Refill:  1    Fill one day early if pharmacy is closed on scheduled refill date. May substitute for generic if available.   Medications administered during this visit: We administered iohexol, lidocaine, sodium chloride flush, ropivacaine (PF) 2 mg/mL (0.2%), and dexamethasone.     Provider-requested follow-up: Return in about 4 weeks (around 04/16/2023).  Future Appointments  Date Time Provider Department Center  03/25/2023  8:15 AM [facility] None  03/29/2023  8:30 AM [facility] INFUSION CHAIR 12 [facility] None  04/22/2023  3:00 PM Matthew Jolly, MD [facility] None  06/10/2023  3:00 PM [facility] None  06/14/2023  3:00 PM Earna Coder, MD [facility] None  06/14/2023  3:30 PM [facility] INFUSION CHAIR 12 [facility] None  08/02/2023  7:30 AM Matthew Brimhall Nizhoni, MD [facility]  08/02/2023  9:00 AM [facility] None  01/27/2024 11:00 AM [facility] None  02/07/2024  8:30 AM Stoioff, Verna Czech, MD [facility] None    Duration of encounter: .  Total time on encounter, as per AMA guidelines included both the face-to-face and non-face-to-face time personally spent by the physician and/or other qualified health care professional(s) on the day of the encounter (includes time in activities that require the physician or other qualified health care professional and does not include time in activities normally  performed by clinical staff). Physician's time may include the following activities when performed: Preparing to see the patient (e.g., pre-charting review of records, searching for previously ordered imaging, lab work, and nerve conduction tests) Review of prior analgesic pharmacotherapies. Reviewing PMP Interpreting ordered tests (e.g., lab work, imaging, nerve conduction tests) Performing post-procedure evaluations, including interpretation of diagnostic procedures Obtaining and/or reviewing separately obtained history Performing a medically appropriate examination and/or evaluation Counseling and educating the patient/family/caregiver Ordering medications, tests, or procedures Referring and communicating with  other health care professionals (when not separately reported) Documenting clinical information in the electronic or other health record Independently interpreting results (not separately reported) and communicating results to the patient/ family/caregiver Care coordination (not separately reported)  Note by: Matthew Jolly, MD (AI and TTS technology used. I apologize for any typographical errors that were not detected and corrected.) Date: 03/19/2023; Time: 3:04 PM

## 2023-03-19 NOTE — Progress Notes (Signed)
PROVIDER NOTE: Interpretation of information contained herein should be left to medically-trained personnel. Specific patient instructions are provided elsewhere under "Patient Instructions" section of medical record. This document was created in part using STT-dictation technology, any transcriptional errors that may result from this process are unintentional.  Patient: Matthew Kelley Type: Established DOB: [birth year] MRN: [id number] PCP: Dale Osseo, MD  Service: Procedure DOS: 03/19/2023 Setting: Ambulatory Location: Ambulatory outpatient facility Delivery: Face-to-face Provider: Edward Jolly, MD Specialty: Interventional Pain Management Specialty designation: 09 Location: Outpatient facility Ref. Prov.: Venetia Night, MD       Interventional Therapy   Procedure: Cervical Epidural Steroid injection (CESI) (Interlaminar) #1  Laterality: Left  Level: C7-T1 Imaging: Fluoroscopy-assisted DOS: 03/19/2023  Performed by: Edward Jolly, MD Anesthesia: Local anesthesia (1-2% Lidocaine)   Purpose: Diagnostic/Therapeutic Indications: Cervicalgia, cervical radicular pain, degenerative disc disease, severe enough to impact quality of life or function. 1. Cervical radicular pain (left C6)   2. Cervical disc herniation    NAS-11 score:   Pre-procedure: 7 /10   Post-procedure: 4 /10      Position  Prep  Materials:  Location setting: Procedure suite Position: Prone, on modified reverse trendelenburg to facilitate breathing, with head in head-cradle. Pillows positioned under chest (below chin-level) with cervical spine flexed. Safety Precautions: Patient was assessed for positional comfort and pressure points before starting the procedure. Prepping solution: DuraPrep (Iodine Povacrylex [0.7% available iodine] and Isopropyl Alcohol, 74% w/w) Prep Area: Entire  cervicothoracic region Approach: percutaneous, paramedial Intended target: Posterior cervical epidural space Materials  Procedure:  Tray: Epidural Needle(s): Epidural (Tuohy) Qty: 1 Length: (90mm) 3.5-inch Gauge: 22G   H&P (Pre-op Assessment):  [name] is a 62 y.o. (year old), male patient, seen today for interventional treatment. He  has a past surgical history that includes Cholecystectomy (2000); Total thyroidectomy (2009); Coronary/Graft Acute MI Revascularization (N/A, 06/16/2020); and LEFT HEART CATH AND CORONARY ANGIOGRAPHY (N/A, 06/16/2020). [name] has a current medication list which includes the following prescription(s): praluent, praluent, aspirin, vitamin d, cyclosporine, gabapentin, ketoconazole, levothyroxine, minoxidil, preservision areds 2, opzelura, testosterone cypionate, valacyclovir, atorvastatin, and isotretinoin. His primarily concern today is the Neck Pain (> on the left)  Initial Vital Signs:  Pulse/HCG Rate: 99  Temp: 99.5 F (37.5 C) Resp: 17 BP: (!) 172/84 SpO2: 100 %  BMI: Estimated body mass index is 26.39 kg/m as calculated from the following:   Height as of this encounter: 6\' 1"  (1.854 m).   Weight as of this encounter: 200 lb (90.7 kg).  Risk Assessment: Allergies: Reviewed. He has No Known Allergies.  Allergy Precautions: None required Coagulopathies: Reviewed. None identified.  Blood-thinner therapy: None at this time Active Infection(s): Reviewed. None identified. [name] is afebrile  Site Confirmation: [name] was asked to confirm the procedure and laterality before marking the site Procedure checklist: Completed Consent: Before the procedure and under the influence of no sedative(s), amnesic(s), or anxiolytics, the patient was informed of the treatment options, risks and possible complications. To fulfill our ethical and legal obligations, as recommended by the American Medical Association's Code of Ethics, I have informed the patient of my clinical impression; the nature and purpose of the treatment or procedure; the risks, benefits, and  possible complications of the intervention; the alternatives, including doing nothing; the risk(s) and benefit(s) of the alternative treatment(s) or procedure(s); and the risk(s) and benefit(s) of doing nothing. The patient was provided information about the general risks and possible complications associated with the procedure. These may include, but are not limited  to: failure to achieve desired goals, infection, bleeding, organ or nerve damage, allergic reactions, paralysis, and death. In addition, the patient was informed of those risks and complications associated to Spine-related procedures, such as failure to decrease pain; infection (i.e.: Meningitis, epidural or intraspinal abscess); bleeding (i.e.: epidural hematoma, subarachnoid hemorrhage, or any other type of intraspinal or peri-dural bleeding); organ or nerve damage (i.e.: Any type of peripheral nerve, nerve root, or spinal cord injury) with subsequent damage to sensory, motor, and/or autonomic systems, resulting in permanent pain, numbness, and/or weakness of one or several areas of the body; allergic reactions; (i.e.: anaphylactic reaction); and/or death. Furthermore, the patient was informed of those risks and complications associated with the medications. These include, but are not limited to: allergic reactions (i.e.: anaphylactic or anaphylactoid reaction(s)); adrenal axis suppression; blood sugar elevation that in diabetics may result in ketoacidosis or comma; water retention that in patients with history of congestive heart failure may result in shortness of breath, pulmonary edema, and decompensation with resultant heart failure; weight gain; swelling or edema; medication-induced neural toxicity; particulate matter embolism and blood vessel occlusion with resultant organ, and/or nervous system infarction; and/or aseptic necrosis of one or more joints. Finally, the patient was informed that Medicine is not an exact science; therefore, there  is also the possibility of unforeseen or unpredictable risks and/or possible complications that may result in a catastrophic outcome. The patient indicated having understood very clearly. We have given the patient no guarantees and we have made no promises. Enough time was given to the patient to ask questions, all of which were answered to the patient's satisfaction. Mr. Hink has indicated that he wanted to continue with the procedure. Attestation: I, the ordering provider, attest that I have discussed with the patient the benefits, risks, side-effects, alternatives, likelihood of achieving goals, and potential problems during recovery for the procedure that I have provided informed consent. Date  Time: 03/19/2023  1:54 PM   Pre-Procedure Preparation:  Monitoring: As per clinic protocol. Respiration, ETCO2, SpO2, BP, heart rate and rhythm monitor placed and checked for adequate function Safety Precautions: Patient was assessed for positional comfort and pressure points before starting the procedure. Time-out: I initiated and conducted the "Time-out" before starting the procedure, as per protocol. The patient was asked to participate by confirming the accuracy of the "Time Out" information. Verification of the correct person, site, and procedure were performed and confirmed by me, the nursing staff, and the patient. "Time-out" conducted as per Joint Commission's Universal Protocol (UP.01.01.01). Time: 1440 Start Time: 1440 hrs.  Description  Narrative of Procedure:          Rationale (medical necessity): procedure needed and proper for the diagnosis and/or treatment of the patient's medical symptoms and needs. Start Time: 1440 hrs. Safety Precautions: Aspiration looking for blood return was conducted prior to all injections. At no point did we inject any substances, as a needle was being advanced. No attempts were made at seeking any paresthesias. Safe injection practices and needle disposal  techniques used. Medications properly checked for expiration dates. SDV (single dose vial) medications used. Description of procedure: Protocol guidelines were followed. The patient was assisted into a comfortable position. The target area was identified and the area prepped in the usual manner. Skin & deeper tissues infiltrated with local anesthetic. Appropriate amount of time allowed to pass for local anesthetics to take effect. Using fluoroscopic guidance, the epidural needle was introduced through the skin, ipsilateral to the reported pain, and advanced to the  target area. Posterior laminar os was contacted and the needle walked caudad, until the lamina was cleared. The ligamentum flavum was engaged and the epidural space identified using "loss-of-resistance technique" with 2-3 ml of PF-NaCl (0.9% NSS), in a 5cc dedicated LOR syringe. (See "Imaging guidance" below for use of contrast details.) Once proper needle placement was secured, and negative aspiration confirmed, the solution was injected in intermittent fashion, asking for systemic symptoms every 0.5cc. The needles were then removed and the area cleansed, making sure to leave some of the prepping solution back to take advantage of its long term bactericidal properties.  3 cc solution made of 1 cc of preservative-free saline, 1 cc of 0.2% ropivacaine, 1 cc of Decadron 10 mg/cc.   Vitals:   03/19/23 1405 03/19/23 1440 03/19/23 1445 03/19/23 1450  BP: (!) 172/84 (!) 166/92 (!) 165/90 (!) 164/89  Pulse: 99 100 99 100  Resp: 17 18 16 17   Temp: 99.5 F (37.5 C)     TempSrc: Temporal     SpO2: 100% 100% 99% 100%  Weight: 200 lb (90.7 kg)     Height: 6\' 1"  (1.854 m)        End Time: 1444 hrs.  Imaging Guidance (Spinal):          Type of Imaging Technique: Fluoroscopy Guidance (Spinal) Indication(s): Fluoroscopy guidance for needle placement to enhance accuracy in procedures requiring precise needle localization for targeted delivery of  medication in or near specific anatomical locations not easily accessible without such real-time imaging assistance. Exposure Time: Please see nurses notes. Contrast: Before injecting any contrast, we confirmed that the patient did not have an allergy to iodine, shellfish, or radiological contrast. Once satisfactory needle placement was completed at the desired level, radiological contrast was injected. Contrast injected under live fluoroscopy. No contrast complications. See chart for type and volume of contrast used. Fluoroscopic Guidance: I was personally present during the use of fluoroscopy. "Tunnel Vision Technique" used to obtain the best possible view of the target area. Parallax error corrected before commencing the procedure. "Direction-depth-direction" technique used to introduce the needle under continuous pulsed fluoroscopy. Once target was reached, antero-posterior, oblique, and lateral fluoroscopic projection used confirm needle placement in all planes. Images permanently stored in EMR. Interpretation: I personally interpreted the imaging intraoperatively. Adequate needle placement confirmed in multiple planes. Appropriate spread of contrast into desired area was observed. No evidence of afferent or efferent intravascular uptake. No intrathecal or subarachnoid spread observed. Permanent images saved into the patient's record.  Post-operative Assessment:  Post-procedure Vital Signs:  Pulse/HCG Rate: 100  Temp: 99.5 F (37.5 C) Resp: 17 BP: (!) 164/89 SpO2: 100 %  EBL: None  Complications: No immediate post-treatment complications observed by team, or reported by patient.  Note: The patient tolerated the entire procedure well. A repeat set of vitals were taken after the procedure and the patient was kept under observation following institutional policy, for this type of procedure. Post-procedural neurological assessment was performed, showing return to baseline, prior to discharge. The  patient was provided with post-procedure discharge instructions, including a section on how to identify potential problems. Should any problems arise concerning this procedure, the patient was given instructions to immediately contact us, at any time, without hesitation. In any case, we plan to contact the patient by telephone for a follow-up status report regarding this interventional procedure.  Comments:  No additional relevant information.  Plan of Care (POC)  Orders:  Orders Placed This Encounter  Procedures   DG PAIN  CLINIC C-ARM 1-60 MIN NO REPORT    Intraoperative interpretation by procedural physician at [facility].    Standing Status:   Standing    Number of Occurrences:   1    Order Specific Question:   Reason for exam:    Answer:   Assistance in needle guidance and placement for procedures requiring needle placement in or near specific anatomical locations not easily accessible without such assistance.    Medications ordered for procedure: Meds ordered this encounter  Medications   iohexol (OMNIPAQUE) 180 MG/ML injection 10 mL    Must be Myelogram-compatible. If not available, you may substitute with a water-soluble, non-ionic, hypoallergenic, myelogram-compatible radiological contrast medium.   lidocaine (XYLOCAINE) 2 % (with pres) injection 400 mg   sodium chloride flush (NS) 0.9 % injection 1 mL   ropivacaine (PF) 2 mg/mL (0.2%) (NAROPIN) injection 1 mL   dexamethasone (DECADRON) injection 10 mg   gabapentin (NEURONTIN) 100 MG capsule    Sig: Take 1-3 capsules (100-300 mg total) by mouth at bedtime. Follow written titration schedule.    Dispense:  90 capsule    Refill:  1    Fill one day early if pharmacy is closed on scheduled refill date. May substitute for generic if available.   Medications administered: We administered iohexol, lidocaine, sodium chloride flush, ropivacaine (PF) 2 mg/mL (0.2%), and dexamethasone.  See the medical record for exact  dosing, route, and time of administration.  Follow-up plan:   Return in about 4 weeks (around 04/16/2023).       Recent Visits No visits were found meeting these conditions. Showing recent visits within past 90 days and meeting all other requirements Today's Visits Date Type Provider Dept  03/19/23 Office Visit Edward Jolly, MD [facility]  Showing today's visits and meeting all other requirements Future Appointments Date Type Provider Dept  04/22/23 Appointment Edward Jolly, MD [facility]  Showing future appointments within next 90 days and meeting all other requirements  Disposition: Discharge home  Discharge (Date  Time): 03/19/2023;   hrs.   Primary Care Physician: Dale Argenta, MD Location[facility] Note by: Edward Jolly, MD (TTS technology used. I apologize for any typographical errors that were not detected and corrected.) Date: 03/19/2023; Time: 3:08 PM  Disclaimer:  Medicine is not an Visual merchandiser. The only guarantee in medicine is that nothing is guaranteed. It is important to note that the decision to proceed with this intervention was based on the information collected from the patient. The Data and conclusions were drawn from the patient's questionnaire, the interview, and the physical examination. Because the information was provided in large part by the patient, it cannot be guaranteed that it has not been purposely or unconsciously manipulated. Every effort has been made to obtain as much relevant data as possible for this evaluation. It is important to note that the conclusions that lead to this procedure are derived in large part from the available data. Always take into account that the treatment will also be dependent on availability of resources and existing treatment guidelines, considered by other Pain Management Practitioners as being common knowledge and practice, at the time of the intervention. For  Medico-Legal purposes, it is also important to point out that variation in procedural techniques and pharmacological choices are the acceptable norm. The indications, contraindications, technique, and results of the above procedure should only be interpreted and judged by a Board-Certified Interventional Pain Specialist with extensive familiarity and expertise in  the same exact procedure and technique.

## 2023-03-21 ENCOUNTER — Encounter: Payer: Self-pay | Admitting: Urology

## 2023-03-22 ENCOUNTER — Encounter: Payer: Self-pay | Admitting: Student in an Organized Health Care Education/Training Program

## 2023-03-22 ENCOUNTER — Other Ambulatory Visit: Payer: Self-pay

## 2023-03-22 DIAGNOSIS — C73 Malignant neoplasm of thyroid gland: Secondary | ICD-10-CM | POA: Diagnosis not present

## 2023-03-22 DIAGNOSIS — G4730 Sleep apnea, unspecified: Secondary | ICD-10-CM | POA: Diagnosis not present

## 2023-03-22 DIAGNOSIS — H3530 Unspecified macular degeneration: Secondary | ICD-10-CM | POA: Diagnosis not present

## 2023-03-22 DIAGNOSIS — E890 Postprocedural hypothyroidism: Secondary | ICD-10-CM | POA: Diagnosis not present

## 2023-03-22 DIAGNOSIS — E230 Hypopituitarism: Secondary | ICD-10-CM | POA: Diagnosis not present

## 2023-03-22 DIAGNOSIS — E785 Hyperlipidemia, unspecified: Secondary | ICD-10-CM | POA: Diagnosis not present

## 2023-03-22 DIAGNOSIS — I700 Atherosclerosis of aorta: Secondary | ICD-10-CM | POA: Diagnosis not present

## 2023-03-22 DIAGNOSIS — I2510 Atherosclerotic heart disease of native coronary artery without angina pectoris: Secondary | ICD-10-CM | POA: Diagnosis not present

## 2023-03-22 DIAGNOSIS — M5416 Radiculopathy, lumbar region: Secondary | ICD-10-CM | POA: Diagnosis not present

## 2023-03-22 DIAGNOSIS — D500 Iron deficiency anemia secondary to blood loss (chronic): Secondary | ICD-10-CM | POA: Diagnosis not present

## 2023-03-22 LAB — PNH PROFILE (-HIGH SENSITIVITY)

## 2023-03-22 MED ORDER — TESTOSTERONE CYPIONATE 200 MG/ML IM SOLN
200.0000 mg | INTRAMUSCULAR | 1 refills | Status: DC
  Filled 2023-03-22 – 2023-04-16 (×6): qty 6, 84d supply, fill #0

## 2023-03-25 ENCOUNTER — Inpatient Hospital Stay: Payer: 59 | Attending: Internal Medicine

## 2023-03-25 DIAGNOSIS — D509 Iron deficiency anemia, unspecified: Secondary | ICD-10-CM | POA: Diagnosis not present

## 2023-03-25 DIAGNOSIS — E611 Iron deficiency: Secondary | ICD-10-CM

## 2023-03-25 LAB — CBC WITH DIFFERENTIAL (CANCER CENTER ONLY)
Abs Immature Granulocytes: 0.01 10*3/uL (ref 0.00–0.07)
Basophils Absolute: 0 10*3/uL (ref 0.0–0.1)
Basophils Relative: 1 %
Eosinophils Absolute: 0.2 10*3/uL (ref 0.0–0.5)
Eosinophils Relative: 5 %
HCT: 38.7 % — ABNORMAL LOW (ref 39.0–52.0)
Hemoglobin: 12.9 g/dL — ABNORMAL LOW (ref 13.0–17.0)
Immature Granulocytes: 0 %
Lymphocytes Relative: 25 %
Lymphs Abs: 1.1 10*3/uL (ref 0.7–4.0)
MCH: 28.2 pg (ref 26.0–34.0)
MCHC: 33.3 g/dL (ref 30.0–36.0)
MCV: 84.7 fL (ref 80.0–100.0)
Monocytes Absolute: 0.5 10*3/uL (ref 0.1–1.0)
Monocytes Relative: 11 %
Neutro Abs: 2.5 10*3/uL (ref 1.7–7.7)
Neutrophils Relative %: 58 %
Platelet Count: 240 10*3/uL (ref 150–400)
RBC: 4.57 MIL/uL (ref 4.22–5.81)
RDW: 13.1 % (ref 11.5–15.5)
WBC Count: 4.2 10*3/uL (ref 4.0–10.5)
nRBC: 0 % (ref 0.0–0.2)

## 2023-03-25 LAB — BASIC METABOLIC PANEL
Anion gap: 9 (ref 5–15)
BUN: 18 mg/dL (ref 8–23)
CO2: 27 mmol/L (ref 22–32)
Calcium: 8.5 mg/dL — ABNORMAL LOW (ref 8.9–10.3)
Chloride: 105 mmol/L (ref 98–111)
Creatinine, Ser: 1.19 mg/dL (ref 0.61–1.24)
GFR, Estimated: >60 mL/min (ref >60)
Glucose, Bld: 104 mg/dL — ABNORMAL HIGH (ref 70–99)
Potassium: 4.1 mmol/L (ref 3.5–5.1)
Sodium: 141 mmol/L (ref 135–145)

## 2023-03-25 LAB — IRON AND TIBC
Iron: 47 ug/dL (ref 45–182)
Saturation Ratios: 12 % — ABNORMAL LOW (ref 17.9–39.5)
TIBC: 406 ug/dL (ref 250–450)
UIBC: 359 ug/dL

## 2023-03-25 LAB — FERRITIN: Ferritin: 13 ng/mL — ABNORMAL LOW (ref 24–336)

## 2023-03-26 ENCOUNTER — Other Ambulatory Visit: Payer: Self-pay

## 2023-03-26 ENCOUNTER — Other Ambulatory Visit (HOSPITAL_COMMUNITY): Payer: Self-pay

## 2023-03-26 ENCOUNTER — Encounter: Payer: Self-pay | Admitting: Urology

## 2023-03-26 ENCOUNTER — Other Ambulatory Visit: Payer: Self-pay | Admitting: Student in an Organized Health Care Education/Training Program

## 2023-03-26 DIAGNOSIS — M5412 Radiculopathy, cervical region: Secondary | ICD-10-CM

## 2023-03-26 DIAGNOSIS — M5020 Other cervical disc displacement, unspecified cervical region: Secondary | ICD-10-CM

## 2023-03-26 MED ORDER — MELOXICAM 15 MG PO TABS
15.0000 mg | ORAL_TABLET | Freq: Every day | ORAL | 0 refills | Status: AC
  Filled 2023-03-26: qty 30, 30d supply, fill #0

## 2023-03-27 ENCOUNTER — Ambulatory Visit: Payer: 59 | Admitting: Dermatology

## 2023-03-27 ENCOUNTER — Other Ambulatory Visit: Payer: Self-pay

## 2023-03-27 VITALS — Wt 200.0 lb

## 2023-03-27 DIAGNOSIS — L853 Xerosis cutis: Secondary | ICD-10-CM | POA: Diagnosis not present

## 2023-03-27 DIAGNOSIS — Z79899 Other long term (current) drug therapy: Secondary | ICD-10-CM | POA: Diagnosis not present

## 2023-03-27 DIAGNOSIS — L700 Acne vulgaris: Secondary | ICD-10-CM | POA: Diagnosis not present

## 2023-03-27 DIAGNOSIS — K130 Diseases of lips: Secondary | ICD-10-CM

## 2023-03-27 MED ORDER — ISOTRETINOIN 20 MG PO CAPS
20.0000 mg | ORAL_CAPSULE | Freq: Two times a day (BID) | ORAL | 0 refills | Status: AC
  Filled 2023-03-27: qty 60, 30d supply, fill #0

## 2023-03-27 NOTE — Progress Notes (Signed)
Isotretinoin Follow-Up Visit   Subjective  Matthew Kelley is a 62 y.o. male who presents for the following: Isotretinoin follow-up. Patient reports no adverse reactions such as dryness, chapped lips, headaches, depression, etc. Would like to continue treatment.    Week # 4   Isotretinoin F/U - 03/27/23 1200       Isotretinoin Follow Up   iPledge # [id number]    Date 03/27/23    Weight 200 lb (90.7 kg)    Acne breakouts since last visit? No      Dosage   Target Dosage (mg) 13710    Current (To Date) Dosage (mg) 1200    To Go Dosage (mg) 12510      Skin Side Effects   Dry Lips No    Nose bleeds No    Dry eyes No    Dry Skin No    Sunburn No      Gastrointestinal Side Effects   Nausea No    Diarrhea No    Blood in stool No      Neurological Side Effects   Blurred vision No    Depression No    Headache No    Homicidal thoughts No    Mood Changes No    Suicidal thoughts No      Constitutional Side Effects   Fatigue No      Musculoskeletal Side Effects   Muscle aches No      Other Side Effects   Other Side Effects no              Side effects: Dry skin, dry lips  The following portions of the chart were reviewed this encounter and updated as appropriate: medications, allergies, medical history  Review of Systems:  No other skin or systemic complaints except as noted in HPI or Assessment and Plan.  Objective  Well appearing patient in no apparent distress; mood and affect are within normal limits.  An examination of the face, neck, chest, and back was performed and relevant findings are noted below.     Assessment & Plan   ACNE VULGARIS   Related Medications ISOtretinoin (ACCUTANE) 20 MG capsule Take 1 capsule (20 mg total) by mouth 2 (two) times daily.  ACNE VULGARIS Patient is currently on Isotretinoin requiring FDA mandated monthly evaluations and laboratory monitoring. Condition is currently not to goal (must reach target dose based  on weight and also have clear skin for 2 months prior to discontinuation in order to help prevent relapse)  Exam findings: Scattered closed comedones along with yellow papules consistent with sebaceous hyperplasia   Week # 4 Pharmacy [facility]  iPLEDGE # [id number] Total mg -  1200 Total mg/kg - 13.23  Continue isotretinoin isotretinoin 20 mg bid.   Patient confirmed in iPledge and isotretinoin sent to pharmacy.    Xerosis secondary to isotretinoin therapy - Continue emollients as directed - Xyzal (levocetirizine) once a day and fish oil 1 gram daily may also help with dryness   Cheilitis secondary to isotretinoin therapy - Continue lip balm as directed, Dr. Clayborne Artist Cortibalm recommended   Long term medication management (isotretinoin).  Patient is using long term (months to years) prescription medication to control their dermatologic condition.  These medications require periodic monitoring to evaluate for efficacy and side effects and may require periodic laboratory monitoring.  - While taking Isotretinoin and for 30 days after you finish the medication, do not share pills, do not donate blood. It is very important that a women  who could become pregnant not take this medicine or get a blood transfusion with this medicine in it. Isotretinoin is best absorbed when taken with a fatty meal. Isotretinoin can make you sensitive to the sun. Daily careful sun protection including sunscreen SPF 30+ when outdoors is recommended.  Follow-up in 30 days.  I, Asher Muir, CMA, am acting as scribe for Willeen Niece, MD.   Documentation: I have reviewed the above documentation for accuracy and completeness, and I agree with the above.  Willeen Niece, MD

## 2023-03-27 NOTE — Patient Instructions (Signed)
While taking isotretinoin, do not share pills and do not donate blood. Generic isotretinoin is best absorbed when taken with a fatty meal. Isotretinoin can make you sensitive to the sun. Daily careful sun protection including sunscreen SPF 30+ when outdoors is recommended.    Due to recent changes in healthcare laws, you may see results of your pathology and/or laboratory studies on MyChart before the doctors have had a chance to review them. We understand that in some cases there may be results that are confusing or concerning to you. Please understand that not all results are received at the same time and often the doctors may need to interpret multiple results in order to provide you with the best plan of care or course of treatment. Therefore, we ask that you please give [place] 2 business days to thoroughly review all your results before contacting the office for clarification. Should we see a critical lab result, you will be contacted sooner.   If You Need Anything After Your Visit  If you have any questions or concerns for your doctor, please call our main line at [phone] and press option 4 to reach your doctor's medical assistant. If no one answers, please leave a voicemail as directed and we will return your call as soon as possible. Messages left after 4 pm will be answered the following business day.   You may also send [place] a message via MyChart. We typically respond to MyChart messages within 1-2 business days.  For prescription refills, please ask your pharmacy to contact our office. Our fax number is [phone].  If you have an urgent issue when the clinic is closed that cannot wait until the next business day, you can page your doctor at the number below.    Please note that while we do our best to be available for urgent issues outside of office hours, we are not available 24/7.   If you have an urgent issue and are unable to reach [place], you may choose to seek medical care at  your doctor's office, retail clinic, urgent care center, or emergency room.  If you have a medical emergency, please immediately call 911 or go to the emergency department.  Pager Numbers  - Dr. Gwen Pounds: [phone]  - Dr. Roseanne Reno: [phone]  - Dr. Katrinka Blazing: [phone]   In the event of inclement weather, please call our main line at [phone] for an update on the status of any delays or closures.  Dermatology Medication Tips: Please keep the boxes that topical medications come in in order to help keep track of the instructions about where and how to use these. Pharmacies typically print the medication instructions only on the boxes and not directly on the medication tubes.   If your medication is too expensive, please contact our office at [phone] option 4 or send [place] a message through MyChart.   We are unable to tell what your co-pay for medications will be in advance as this is different depending on your insurance coverage. However, we may be able to find a substitute medication at lower cost or fill out paperwork to get insurance to cover a needed medication.   If a prior authorization is required to get your medication covered by your insurance company, please allow [place] 1-2 business days to complete this process.  Drug prices often vary depending on where the prescription is filled and some pharmacies may offer cheaper prices.  The website www.goodrx.com contains coupons for medications through different pharmacies. The prices  here do not account for what the cost may be with help from insurance (it may be cheaper with your insurance), but the website can give you the price if you did not use any insurance.  - You can print the associated coupon and take it with your prescription to the pharmacy.  - You may also stop by our office during regular business hours and pick up a GoodRx coupon card.  - If you need your prescription sent electronically to a different pharmacy,  notify our office through [facility] or by phone at [phone] option 4.     Si Usted Necesita Algo Despus de Su Visita  Tambin puede enviarnos un mensaje a travs de Clinical cytogeneticist. Por lo general respondemos a los mensajes de MyChart en el transcurso de 1 a 2 das hbiles.  Para renovar recetas, por favor pida a su farmacia que se ponga en contacto con nuestra oficina. Annie Sable de fax es [place] [phone].  Si tiene un asunto urgente cuando la clnica est cerrada y que no puede esperar hasta el siguiente da hbil, puede llamar/localizar a su doctor(a) al nmero que aparece a continuacin.   Por favor, tenga en cuenta que aunque hacemos todo lo posible para estar disponibles para asuntos urgentes fuera del horario de [place], no estamos disponibles las 24 horas del da, los 7 [address]  [address] de la [place].   Si tiene un problema urgente y no puede comunicarse con nosotros, puede optar por buscar atencin mdica  en el consultorio de su doctor(a), en una clnica privada, en un centro de atencin urgente o en una sala de emergencias.  Si tiene Engineer, drilling, por favor llame inmediatamente al 911 o vaya a la sala de emergencias.  Nmeros de bper  - Dr. Gwen Pounds: [phone]  - Dra. Roseanne Reno: [phone]  - Dr. Katrinka Blazing: [phone]   En caso de inclemencias del tiempo, por favor llame a Lacy Duverney principal al [phone] para una actualizacin sobre el [place] de cualquier retraso o cierre.  Consejos para la medicacin en dermatologa: Por favor, guarde las cajas en las que vienen los medicamentos de uso tpico para ayudarle a seguir las instrucciones sobre dnde y cmo usarlos. Las farmacias generalmente imprimen las instrucciones del medicamento slo en las cajas y no directamente en los tubos del [place].   Si su medicamento es muy caro, por favor, pngase en contacto con Rolm Gala llamando al [phone] y presione la opcin 4 o envenos un mensaje a travs de  Clinical cytogeneticist.   No podemos decirle cul ser su copago por los medicamentos por adelantado ya que esto es diferente dependiendo de la cobertura de su seguro. Sin embargo, es posible que podamos encontrar un medicamento sustituto a Audiological scientist un formulario para que el seguro cubra el medicamento que se considera necesario.   Si se requiere una autorizacin previa para que su compaa de seguros [place] su medicamento, por favor permtanos de 1 a 2 das hbiles para completar [address]  Los precios de los medicamentos varan con frecuencia dependiendo del Environmental consultant de dnde se surte la receta y alguna farmacias pueden ofrecer precios ms baratos.  El sitio web www.goodrx.com tiene cupones para medicamentos de Health and safety inspector. Los precios aqu no tienen en cuenta lo que podra costar con la ayuda del seguro (puede ser ms barato con su seguro), pero el sitio web puede darle el precio si no utiliz Tourist information centre manager.  - Puede imprimir el cupn correspondiente y llevarlo con su receta a  la farmacia.  - Tambin puede pasar por nuestra oficina durante el horario de atencin regular y Education officer, museum una tarjeta de cupones de GoodRx.  - Si necesita que su receta se enve electrnicamente a una farmacia diferente, informe a nuestra oficina a travs de MyChart de [place] o por telfono llamando al [phone] y presione la opcin 4.

## 2023-03-28 ENCOUNTER — Other Ambulatory Visit: Payer: Self-pay

## 2023-03-29 ENCOUNTER — Inpatient Hospital Stay: Payer: 59

## 2023-03-29 ENCOUNTER — Telehealth: Payer: Self-pay | Admitting: Internal Medicine

## 2023-03-29 ENCOUNTER — Encounter: Payer: Self-pay | Admitting: Neurosurgery

## 2023-03-29 ENCOUNTER — Encounter: Payer: Self-pay | Admitting: Student in an Organized Health Care Education/Training Program

## 2023-03-29 VITALS — BP 130/80 | HR 86 | Temp 96.7°F | Resp 17

## 2023-03-29 DIAGNOSIS — E611 Iron deficiency: Secondary | ICD-10-CM

## 2023-03-29 DIAGNOSIS — D509 Iron deficiency anemia, unspecified: Secondary | ICD-10-CM | POA: Diagnosis not present

## 2023-03-29 MED ORDER — IRON SUCROSE 20 MG/ML IV SOLN
200.0000 mg | Freq: Once | INTRAVENOUS | Status: AC
  Administered 2023-03-29: 200 mg via INTRAVENOUS
  Filled 2023-03-29: qty 10

## 2023-03-29 MED ORDER — SODIUM CHLORIDE 0.9% FLUSH
10.0000 mL | Freq: Once | INTRAVENOUS | Status: AC | PRN
  Administered 2023-03-29: 10 mL
  Filled 2023-03-29: qty 10

## 2023-03-29 NOTE — Telephone Encounter (Signed)
Scheduled 2 iv iron appointments per secure chat called patient and he confirmed

## 2023-04-01 NOTE — Telephone Encounter (Signed)
Patient is scheduled for the 31st

## 2023-04-08 ENCOUNTER — Inpatient Hospital Stay: Payer: 59

## 2023-04-08 VITALS — BP 140/74 | HR 85 | Temp 98.7°F

## 2023-04-08 DIAGNOSIS — E611 Iron deficiency: Secondary | ICD-10-CM

## 2023-04-08 DIAGNOSIS — D509 Iron deficiency anemia, unspecified: Secondary | ICD-10-CM | POA: Diagnosis not present

## 2023-04-08 MED ORDER — SODIUM CHLORIDE 0.9% FLUSH
10.0000 mL | Freq: Once | INTRAVENOUS | Status: AC | PRN
Start: 2023-04-08 — End: 2023-04-08
  Administered 2023-04-08: 10 mL
  Filled 2023-04-08: qty 10

## 2023-04-08 MED ORDER — IRON SUCROSE 20 MG/ML IV SOLN
200.0000 mg | Freq: Once | INTRAVENOUS | Status: AC
  Administered 2023-04-08: 200 mg via INTRAVENOUS

## 2023-04-09 ENCOUNTER — Ambulatory Visit (INDEPENDENT_AMBULATORY_CARE_PROVIDER_SITE_OTHER): Payer: 59 | Admitting: Neurosurgery

## 2023-04-09 ENCOUNTER — Encounter: Payer: Self-pay | Admitting: Neurosurgery

## 2023-04-09 ENCOUNTER — Other Ambulatory Visit: Payer: Self-pay

## 2023-04-09 VITALS — BP 146/92 | Ht 73.0 in | Wt 200.0 lb

## 2023-04-09 DIAGNOSIS — Z01818 Encounter for other preprocedural examination: Secondary | ICD-10-CM

## 2023-04-09 DIAGNOSIS — M4722 Other spondylosis with radiculopathy, cervical region: Secondary | ICD-10-CM

## 2023-04-09 DIAGNOSIS — M4802 Spinal stenosis, cervical region: Secondary | ICD-10-CM | POA: Diagnosis not present

## 2023-04-09 DIAGNOSIS — M5412 Radiculopathy, cervical region: Secondary | ICD-10-CM

## 2023-04-09 NOTE — H&P (View-Only) (Signed)
 Referring Physician:  Dale [place], MD [address] [place],  [place] [address]  Primary Physician:  Dale [place], MD  History of Present Illness: 04/09/2023 Matthew Kelley returns to see me.  He has had continued symptoms of left thumb numbness and discomfort into his shoulder blade.  He is now having numbness in his right thumb as well.  He has tried physical therapy without improvement.   02/01/2023 [name] is here today with a chief complaint of neck and arm symptoms.  He began having neck and posterior left shoulder pain approximately 2 months ago.  He had 1 episode of this approximately 2 to 3 months prior to that which resolved in approximately 6 weeks.  Over the past 4 days, he has also had onset of left thumb numbness.  He has some possible discomfort in his left forearm.  He denies any weakness.  Working on the computer and turning his head to the left to make his pain worse.  Ibuprofen has helped.  He started physical therapy today which included traction and exercises.  This also helped his symptoms.  He has no symptoms of cervical myelopathy.  Bowel/Bladder Dysfunction: none  Conservative measures:  Physical therapy: scheduled for 02/01/23 with [place] at [facility]  Multimodal medical therapy including regular antiinflammatories: none  Injections: none epidural steroid injections  Past Surgery: none  The symptoms are causing a significant impact on the patient's life.   I have utilized the care everywhere function in epic to review the outside records available from external health systems.  Review of Systems:  A 10 point review of systems is negative, except for the pertinent positives and negatives detailed in the HPI.  Past Medical History: Past Medical History:  Diagnosis Date   Atypical nevus 09/07/2008   Left scapula, lateral. Moderate atypia.   Blood in stool    h/o   Cancer (HCC)    thyroid cancer    Dysplastic nevus 12/20/2009   Spinal lower back. Moderate atypia, edges free   Dysplastic nevus 12/20/2009   Left paraspinal lower back. Mild atypia, edges free.   Dysplastic nevus 11/24/2018   Right lower back. Mild atypia, deep margin involved.   Dysplastic nevus 11/24/2018   Right mid back. Moderate atypia and halo nevus effect. Limited margins free.   H/O total thyroidectomy    History of chicken pox    Hypercholesterolemia    Hypothyroidism    Sleep apnea    Thyroid cancer (HCC)    Vertigo     Past Surgical History: Past Surgical History:  Procedure Laterality Date   CHOLECYSTECTOMY  2000   CORONARY/GRAFT ACUTE MI REVASCULARIZATION N/A 06/16/2020   Procedure: Coronary/Graft Acute MI Revascularization;  Surgeon: Marcina Millard, MD;  Location: [facility] INVASIVE CV LAB;  Service: Cardiovascular;  Laterality: N/A;   LEFT HEART CATH AND CORONARY ANGIOGRAPHY N/A 06/16/2020   Procedure: LEFT HEART CATH AND CORONARY ANGIOGRAPHY;  Surgeon: Marcina Millard, MD;  Location: [facility] INVASIVE CV LAB;  Service: Cardiovascular;  Laterality: N/A;   TOTAL THYROIDECTOMY  2009    Allergies: Allergies as of 04/09/2023   (No Known Allergies)    Medications:  Current Outpatient Medications:    Alirocumab (PRALUENT) 150 MG/ML SOAJ, Inject 150 mg into the skin every 14 (fourteen) days., Disp: 6 mL, Rfl: 4   Alirocumab (PRALUENT) 150 MG/ML SOAJ, Inject 1 mL (150 mg total) into the skin every 14 (fourteen) days., Disp: 6 mL, Rfl: 4   aspirin 81 MG  chewable tablet, Chew 1 tablet (81 mg total) by mouth daily., Disp: 30 tablet, Rfl: 2   atorvastatin (LIPITOR) 10 MG tablet, Take 1 tablet (10 mg total) by mouth once daily, Disp: 30 tablet, Rfl: 11   Cholecalciferol (VITAMIN D) 50 MCG (2000 UT) tablet, Take 2,000 Units by mouth daily., Disp: , Rfl:    cycloSPORINE (RESTASIS) 0.05 % ophthalmic emulsion, Place 1 drop into both eyes 2 (two) times daily., Disp: 180 each, Rfl: 3   gabapentin (NEURONTIN)  100 MG capsule, Take 1-3 capsules (100-300 mg total) by mouth at bedtime. Follow written titration schedule., Disp: 90 capsule, Rfl: 1   ISOtretinoin (ACCUTANE) 20 MG capsule, Take 1 capsule (20 mg total) by mouth 2 (two) times daily., Disp: 60 capsule, Rfl: 0   ketoconazole (NIZORAL) 2 % shampoo, Apply 1 Application topically 3 (three) times a week. Wash scalp 3 times weekly, let sit 5 minutes and rinse out, Disp: 360 mL, Rfl: 4   levothyroxine (SYNTHROID) 175 MCG tablet, Take 175 mcg by mouth daily before breakfast., Disp: , Rfl:    meloxicam (MOBIC) 15 MG tablet, Take 1 tablet (15 mg total) by mouth daily after breakfast. Avoid other NSAIDs, Disp: 30 tablet, Rfl: 0   minoxidil (LONITEN) 2.5 MG tablet, Take 2 tablets (5 mg total) by mouth daily., Disp: 180 tablet, Rfl: 4   Multiple Vitamins-Minerals (PRESERVISION AREDS 2) CAPS, , Disp: , Rfl:    Ruxolitinib Phosphate (OPZELURA) 1.5 % CREA, Apply 1 Application topically daily to areas of vitiligo on body, Disp: 180 g, Rfl: 4   testosterone cypionate (DEPOTESTOSTERONE CYPIONATE) 200 MG/ML injection, Inject 1 mL (200 mg total) into the muscle every 14 (fourteen) days., Disp: 6 mL, Rfl: 1   valACYclovir (VALTREX) 1000 MG tablet, Take 1 tablet (1,000 mg total) by mouth as directed. Take 2 tablets at onset of fever blister symptoms then 2 tablets 12 hours later, Disp: 90 tablet, Rfl: 4  Social History: Social History   Tobacco Use   Smoking status: Never   Smokeless tobacco: Never  Vaping Use   Vaping status: Never Used  Substance Use Topics   Alcohol use: Yes    Alcohol/week: 0.0 standard drinks of alcohol   Drug use: No    Family Medical History: Family History  Problem Relation Age of Onset   Hyperlipidemia Father    Heart disease Father     Physical Examination: Vitals:   04/09/23 1401  BP: (!) 146/92    General: Patient is in no apparent distress. Attention to examination is appropriate.  Neck:   Supple.  Full range of motion  with discomfort on full extension and rotation to the L.  Respiratory: Patient is breathing without any difficulty.   NEUROLOGICAL:     Awake, alert, oriented to person, place, and time.  Speech is clear and fluent.   Cranial Nerves: Pupils equal round and reactive to light.  Facial tone is symmetric.  Facial sensation is symmetric. Shoulder shrug is symmetric. Tongue protrusion is midline.  There is no pronator drift.  Strength: Side Biceps Triceps Deltoid Interossei Grip Wrist Ext. Wrist Flex.  R 5 5 5 5 5 5 5   L 5 5 5 5 5 5 5    Side Iliopsoas Quads Hamstring PF DF EHL  R 5 5 5 5 5 5   L 5 5 5 5 5 5    Reflexes are 2+ and symmetric at the biceps, triceps, brachioradialis, patella and achilles.   Hoffman's is absent.   Bilateral  upper and lower extremity sensation is intact to light touch.    No evidence of dysmetria noted.  Gait is normal.     Medical Decision Making  Imaging: MRI C spine 03/01/2023 IMPRESSION: 1. Multilevel cervical spondylosis, worst at C5-C6, where there is moderate spinal canal stenosis and severe bilateral neural foraminal narrowing. 2. Moderate left neural foraminal narrowing at C2-C3 and moderate right neural foraminal narrowing at C4-C5. 3. Partially imaged T2 hyperintense lesion in the pharynx along the right aspect of the soft palate, measuring up to 4.4 cm. Recommend further evaluation with direct visualization and contrast-enhanced neck CT.   Electronically Signed: By: Orvan Falconer M.D. On: 03/05/2023 14:59  C spine flex/ext 03/06/2023 IMPRESSION: Degenerative changes. No acute osseous abnormalities.     Electronically Signed   By: Layla Maw M.D.   On: 03/20/2023 22:21     I have personally reviewed the images and agree with the above interpretation.  Assessment and Plan: Matthew Kelley is a pleasant 62 y.o. male with symptoms most consistent with a bilateral C6 radiculopathy.    He has worsening neurologic symptoms.   He has tried physical therapy as well as injections and medications without improvement.  With his worsening numbness on the right side, he has progressive bilateral C6 radiculopathy.  At this point, no further conservative management is indicated.  We reviewed the options which include cervical disc arthroplasty versus anterior cervical discectomy and fusion.  He is a candidate for cervical disc arthroplasty.  I have reviewed the literature with him regarding this decision.  I discussed the planned procedure at length with the patient, including the risks, benefits, alternatives, and indications. The risks discussed include but are not limited to bleeding, infection, need for reoperation, spinal fluid leak, stroke, vision loss, anesthetic complication, coma, paralysis, and even death. We also discussed the possibility of post-operative dysphagia, vocal cord paralysis, and the risk of adjacent segment disease in the future. I also described in detail that improvement was not guaranteed.  The patient expressed understanding of these risks, and asked that we proceed with surgery. I described the surgery in layman's terms, and gave ample opportunity for questions, which were answered to the best of my ability.   I spent a total of 10 minutes in this patient's care today. This time was spent reviewing pertinent records including imaging studies, obtaining and confirming history, performing a directed evaluation, formulating and discussing my recommendations, and documenting the visit within the medical record.   Thank you for involving me in the care of this patient.      Matthew Kelley K. Myer Haff MD, [facility] Neurosurgery

## 2023-04-09 NOTE — Patient Instructions (Signed)
 Please see below for information in regards to your upcoming surgery:   Planned surgery: C5-6 arthroplasty   Surgery date: 05/01/23 at [facility]: [address], [place], [place] [address]) - you will find out your arrival time the business day before your surgery.   Pre-op appointment at [facility] Pre-admit Testing: we will call you with a date/time for this. If you are scheduled for an in person appointment, Pre-admit Testing is located on the first floor of the [facility] building, [address] [address] [facility] [address], [address]. Please bring all prescriptions in the original prescription bottles to your appointment. During this appointment, they will advise you which medications you can take the morning of surgery, and which medications you will need to hold for surgery. Labs (such as blood work, EKG) may be done at your pre-op appointment. You are not required to fast for these labs. Should you need to change your pre-op appointment, please call Pre-admit testing at [phone].     Blood thinners:   Aspirin :   ok to stay on aspirin  81mg        Surgical clearance: we will send a clearance form to Dr Glendia. They may wish to see you in their office prior to signing the clearance form. If so, they may call you to schedule an appointment.    Common restrictions after surgery: No bending, lifting, or twisting ("BLT"). Avoid lifting objects heavier than 10 pounds for the first 6 weeks after surgery. Where possible, avoid household activities that involve lifting, bending, reaching, pushing, or pulling such as laundry, vacuuming, grocery shopping, and childcare. Try to arrange for help from friends and family for these activities while you heal. Do not drive while taking prescription pain medication. Weeks 6 through 12 after surgery: avoid lifting more than 25 pounds.    X-rays after surgery: Because you are having an arthroplasty: for appointments after  your 2 week follow-up: please arrive at the [facility] outpatient imaging center ([address], [address], [place]) or [employer] one hour prior to your appointment for x-rays. This applies to every appointment after your 2 week follow-up. Failure to do so may result in your appointment being rescheduled.   How to contact us :  If you have any questions/concerns before or after surgery, you can reach us  at [phone], or you can send a mychart message. We can be reached by phone or mychart 8am-4pm, Monday-Friday.  *Please note: Calls after 4pm are forwarded to a third party answering service. Mychart messages are not routinely monitored during evenings, weekends, and holidays. Please call our office to contact the answering service for urgent concerns during non-business hours.    If you have FMLA/disability paperwork, please drop it off or fax it to [phone], attention Patty.   Appointments/FMLA & disability paperwork: Odetta Mora, & Ritta Registered Nurse/Surgery scheduler: [place] Medical Assistants: Damien ODESSIA Sailors Physician Assistants: Lyle Decamp, PA-C, Edsel Goods, PA-C & Glade Boys, PA-C Surgeons: Reeves Daisy, MD & Penne Sharps, MD

## 2023-04-09 NOTE — Progress Notes (Signed)
 Referring Physician:  Dale [place], MD [address] [place],  [place] [address]  Primary Physician:  Dale [place], MD  History of Present Illness: 04/09/2023 Matthew Kelley returns to see me.  He has had continued symptoms of left thumb numbness and discomfort into his shoulder blade.  He is now having numbness in his right thumb as well.  He has tried physical therapy without improvement.   02/01/2023 Matthew Kelley is here today with a chief complaint of neck and arm symptoms.  He began having neck and posterior left shoulder pain approximately 2 months ago.  He had 1 episode of this approximately 2 to 3 months prior to that which resolved in approximately 6 weeks.  Over the past 4 days, he has also had onset of left thumb numbness.  He has some possible discomfort in his left forearm.  He denies any weakness.  Working on the computer and turning his head to the left to make his pain worse.  Ibuprofen has helped.  He started physical therapy today which included traction and exercises.  This also helped his symptoms.  He has no symptoms of cervical myelopathy.  Bowel/Bladder Dysfunction: none  Conservative measures:  Physical therapy: scheduled for 02/01/23 with [place] at [facility]  Multimodal medical therapy including regular antiinflammatories: none  Injections: none epidural steroid injections  Past Surgery: none  The symptoms are causing a significant impact on the patient's life.   I have utilized the care everywhere function in epic to review the outside records available from external health systems.  Review of Systems:  A 10 point review of systems is negative, except for the pertinent positives and negatives detailed in the HPI.  Past Medical History: Past Medical History:  Diagnosis Date   Atypical nevus 09/07/2008   Left scapula, lateral. Moderate atypia.   Blood in stool    h/o   Cancer (HCC)    thyroid cancer    Dysplastic nevus 12/20/2009   Spinal lower back. Moderate atypia, edges free   Dysplastic nevus 12/20/2009   Left paraspinal lower back. Mild atypia, edges free.   Dysplastic nevus 11/24/2018   Right lower back. Mild atypia, deep margin involved.   Dysplastic nevus 11/24/2018   Right mid back. Moderate atypia and halo nevus effect. Limited margins free.   H/O total thyroidectomy    History of chicken pox    Hypercholesterolemia    Hypothyroidism    Sleep apnea    Thyroid cancer (HCC)    Vertigo     Past Surgical History: Past Surgical History:  Procedure Laterality Date   CHOLECYSTECTOMY  2000   CORONARY/GRAFT ACUTE MI REVASCULARIZATION N/A 06/16/2020   Procedure: Coronary/Graft Acute MI Revascularization;  Surgeon: Marcina Millard, MD;  Location: [facility] INVASIVE CV LAB;  Service: Cardiovascular;  Laterality: N/A;   LEFT HEART CATH AND CORONARY ANGIOGRAPHY N/A 06/16/2020   Procedure: LEFT HEART CATH AND CORONARY ANGIOGRAPHY;  Surgeon: Marcina Millard, MD;  Location: [facility] INVASIVE CV LAB;  Service: Cardiovascular;  Laterality: N/A;   TOTAL THYROIDECTOMY  2009    Allergies: Allergies as of 04/09/2023   (No Known Allergies)    Medications:  Current Outpatient Medications:    Alirocumab (PRALUENT) 150 MG/ML SOAJ, Inject 150 mg into the skin every 14 (fourteen) days., Disp: 6 mL, Rfl: 4   Alirocumab (PRALUENT) 150 MG/ML SOAJ, Inject 1 mL (150 mg total) into the skin every 14 (fourteen) days., Disp: 6 mL, Rfl: 4   aspirin 81 MG  chewable tablet, Chew 1 tablet (81 mg total) by mouth daily., Disp: 30 tablet, Rfl: 2   atorvastatin (LIPITOR) 10 MG tablet, Take 1 tablet (10 mg total) by mouth once daily, Disp: 30 tablet, Rfl: 11   Cholecalciferol (VITAMIN D) 50 MCG (2000 UT) tablet, Take 2,000 Units by mouth daily., Disp: , Rfl:    cycloSPORINE (RESTASIS) 0.05 % ophthalmic emulsion, Place 1 drop into both eyes 2 (two) times daily., Disp: 180 each, Rfl: 3   gabapentin (NEURONTIN)  100 MG capsule, Take 1-3 capsules (100-300 mg total) by mouth at bedtime. Follow written titration schedule., Disp: 90 capsule, Rfl: 1   ISOtretinoin (ACCUTANE) 20 MG capsule, Take 1 capsule (20 mg total) by mouth 2 (two) times daily., Disp: 60 capsule, Rfl: 0   ketoconazole (NIZORAL) 2 % shampoo, Apply 1 Application topically 3 (three) times a week. Wash scalp 3 times weekly, let sit 5 minutes and rinse out, Disp: 360 mL, Rfl: 4   levothyroxine (SYNTHROID) 175 MCG tablet, Take 175 mcg by mouth daily before breakfast., Disp: , Rfl:    meloxicam (MOBIC) 15 MG tablet, Take 1 tablet (15 mg total) by mouth daily after breakfast. Avoid other NSAIDs, Disp: 30 tablet, Rfl: 0   minoxidil (LONITEN) 2.5 MG tablet, Take 2 tablets (5 mg total) by mouth daily., Disp: 180 tablet, Rfl: 4   Multiple Vitamins-Minerals (PRESERVISION AREDS 2) CAPS, , Disp: , Rfl:    Ruxolitinib Phosphate (OPZELURA) 1.5 % CREA, Apply 1 Application topically daily to areas of vitiligo on body, Disp: 180 g, Rfl: 4   testosterone cypionate (DEPOTESTOSTERONE CYPIONATE) 200 MG/ML injection, Inject 1 mL (200 mg total) into the muscle every 14 (fourteen) days., Disp: 6 mL, Rfl: 1   valACYclovir (VALTREX) 1000 MG tablet, Take 1 tablet (1,000 mg total) by mouth as directed. Take 2 tablets at onset of fever blister symptoms then 2 tablets 12 hours later, Disp: 90 tablet, Rfl: 4  Social History: Social History   Tobacco Use   Smoking status: Never   Smokeless tobacco: Never  Vaping Use   Vaping status: Never Used  Substance Use Topics   Alcohol use: Yes    Alcohol/week: 0.0 standard drinks of alcohol   Drug use: No    Family Medical History: Family History  Problem Relation Age of Onset   Hyperlipidemia Father    Heart disease Father     Physical Examination: Vitals:   04/09/23 1401  BP: (!) 146/92    General: Patient is in no apparent distress. Attention to examination is appropriate.  Neck:   Supple.  Full range of motion  with discomfort on full extension and rotation to the L.  Respiratory: Patient is breathing without any difficulty.   NEUROLOGICAL:     Awake, alert, oriented to person, place, and time.  Speech is clear and fluent.   Cranial Nerves: Pupils equal round and reactive to light.  Facial tone is symmetric.  Facial sensation is symmetric. Shoulder shrug is symmetric. Tongue protrusion is midline.  There is no pronator drift.  Strength: Side Biceps Triceps Deltoid Interossei Grip Wrist Ext. Wrist Flex.  R 5 5 5 5 5 5 5   L 5 5 5 5 5 5 5    Side Iliopsoas Quads Hamstring PF DF EHL  R 5 5 5 5 5 5   L 5 5 5 5 5 5    Reflexes are 2+ and symmetric at the biceps, triceps, brachioradialis, patella and achilles.   Hoffman's is absent.   Bilateral  upper and lower extremity sensation is intact to light touch.    No evidence of dysmetria noted.  Gait is normal.     Medical Decision Making  Imaging: MRI C spine 03/01/2023 IMPRESSION: 1. Multilevel cervical spondylosis, worst at C5-C6, where there is moderate spinal canal stenosis and severe bilateral neural foraminal narrowing. 2. Moderate left neural foraminal narrowing at C2-C3 and moderate right neural foraminal narrowing at C4-C5. 3. Partially imaged T2 hyperintense lesion in the pharynx along the right aspect of the soft palate, measuring up to 4.4 cm. Recommend further evaluation with direct visualization and contrast-enhanced neck CT.   Electronically Signed: By: Orvan Falconer M.D. On: 03/05/2023 14:59  C spine flex/ext 03/06/2023 IMPRESSION: Degenerative changes. No acute osseous abnormalities.     Electronically Signed   By: Layla Maw M.D.   On: 03/20/2023 22:21     I have personally reviewed the images and agree with the above interpretation.  Assessment and Plan: Mr. Cumpston is a pleasant 62 y.o. male with symptoms most consistent with a bilateral C6 radiculopathy.    He has worsening neurologic symptoms.   He has tried physical therapy as well as injections and medications without improvement.  With his worsening numbness on the right side, he has progressive bilateral C6 radiculopathy.  At this point, no further conservative management is indicated.  We reviewed the options which include cervical disc arthroplasty versus anterior cervical discectomy and fusion.  He is a candidate for cervical disc arthroplasty.  I have reviewed the literature with him regarding this decision.  I discussed the planned procedure at length with the patient, including the risks, benefits, alternatives, and indications. The risks discussed include but are not limited to bleeding, infection, need for reoperation, spinal fluid leak, stroke, vision loss, anesthetic complication, coma, paralysis, and even death. We also discussed the possibility of post-operative dysphagia, vocal cord paralysis, and the risk of adjacent segment disease in the future. I also described in detail that improvement was not guaranteed.  The patient expressed understanding of these risks, and asked that we proceed with surgery. I described the surgery in layman's terms, and gave ample opportunity for questions, which were answered to the best of my ability.   I spent a total of 10 minutes in this patient's care today. This time was spent reviewing pertinent records including imaging studies, obtaining and confirming history, performing a directed evaluation, formulating and discussing my recommendations, and documenting the visit within the medical record.   Thank you for involving me in the care of this patient.      Resha Filippone K. Myer Haff MD, [facility] Neurosurgery

## 2023-04-11 ENCOUNTER — Encounter: Payer: Self-pay | Admitting: Student in an Organized Health Care Education/Training Program

## 2023-04-11 ENCOUNTER — Other Ambulatory Visit: Payer: Self-pay

## 2023-04-11 ENCOUNTER — Other Ambulatory Visit (HOSPITAL_COMMUNITY): Payer: Self-pay

## 2023-04-11 DIAGNOSIS — I252 Old myocardial infarction: Secondary | ICD-10-CM | POA: Diagnosis not present

## 2023-04-11 DIAGNOSIS — E782 Mixed hyperlipidemia: Secondary | ICD-10-CM | POA: Diagnosis not present

## 2023-04-11 DIAGNOSIS — I2510 Atherosclerotic heart disease of native coronary artery without angina pectoris: Secondary | ICD-10-CM | POA: Diagnosis not present

## 2023-04-11 DIAGNOSIS — I454 Nonspecific intraventricular block: Secondary | ICD-10-CM | POA: Diagnosis not present

## 2023-04-11 DIAGNOSIS — Z955 Presence of coronary angioplasty implant and graft: Secondary | ICD-10-CM | POA: Diagnosis not present

## 2023-04-11 DIAGNOSIS — Z01818 Encounter for other preprocedural examination: Secondary | ICD-10-CM | POA: Diagnosis not present

## 2023-04-11 MED ORDER — PRALUENT 150 MG/ML ~~LOC~~ SOAJ
150.0000 mg | SUBCUTANEOUS | 4 refills | Status: AC
  Filled 2023-05-24: qty 6, 84d supply, fill #0
  Filled 2023-08-15: qty 6, 84d supply, fill #1
  Filled 2023-11-04: qty 6, 84d supply, fill #2
  Filled 2024-01-27: qty 6, 84d supply, fill #3

## 2023-04-11 NOTE — Telephone Encounter (Signed)
 Ok, will do

## 2023-04-12 ENCOUNTER — Telehealth: Payer: Self-pay | Admitting: Internal Medicine

## 2023-04-12 ENCOUNTER — Other Ambulatory Visit: Payer: Self-pay

## 2023-04-12 DIAGNOSIS — H353132 Nonexudative age-related macular degeneration, bilateral, intermediate dry stage: Secondary | ICD-10-CM | POA: Diagnosis not present

## 2023-04-12 DIAGNOSIS — H31093 Other chorioretinal scars, bilateral: Secondary | ICD-10-CM | POA: Diagnosis not present

## 2023-04-12 DIAGNOSIS — H04123 Dry eye syndrome of bilateral lacrimal glands: Secondary | ICD-10-CM | POA: Diagnosis not present

## 2023-04-12 DIAGNOSIS — H43812 Vitreous degeneration, left eye: Secondary | ICD-10-CM | POA: Diagnosis not present

## 2023-04-12 DIAGNOSIS — H35372 Puckering of macula, left eye: Secondary | ICD-10-CM | POA: Diagnosis not present

## 2023-04-12 DIAGNOSIS — H35411 Lattice degeneration of retina, right eye: Secondary | ICD-10-CM | POA: Diagnosis not present

## 2023-04-12 DIAGNOSIS — H26492 Other secondary cataract, left eye: Secondary | ICD-10-CM | POA: Diagnosis not present

## 2023-04-12 NOTE — Telephone Encounter (Signed)
 Copied from CRM [id number]. Topic: General - Other >> Apr 12, 2023  2:17 PM Joanell B wrote: Reason for CRM: Tinnie is calling from the Neuro Surgery clinic confirm that the pt paperwork was received regarding the pt surgery , she stated that it was faxed on 04/09/23 and would like a follow up. Callback is [phone]

## 2023-04-12 NOTE — Telephone Encounter (Signed)
 Have not received. Called lauren to have her refax.

## 2023-04-15 ENCOUNTER — Other Ambulatory Visit: Payer: 59

## 2023-04-15 NOTE — Telephone Encounter (Signed)
 Copied from CRM [id number]. Topic: General - Other >> Apr 15, 2023  3:17 PM Melissa C wrote: Reason for CRM: Lauren from [facility] calling to see if clearance fax that was sent on Friday was received. Callback number is [phone]

## 2023-04-16 ENCOUNTER — Other Ambulatory Visit: Payer: Self-pay

## 2023-04-16 NOTE — Telephone Encounter (Signed)
 Discussed with Lauren and provided secure email for her to send clearance form to.

## 2023-04-17 ENCOUNTER — Telehealth: Payer: Self-pay

## 2023-04-17 NOTE — Telephone Encounter (Signed)
 LMTCB and schedule medical clearance with Dr Lorin Picket.

## 2023-04-19 ENCOUNTER — Ambulatory Visit: Payer: 59

## 2023-04-19 ENCOUNTER — Other Ambulatory Visit: Payer: 59

## 2023-04-22 ENCOUNTER — Ambulatory Visit: Payer: 59 | Admitting: Student in an Organized Health Care Education/Training Program

## 2023-04-28 NOTE — Progress Notes (Unsigned)
Subjective:    Patient ID: Matthew Kelley, male    DOB: Jan 24, 1961, 63 y.o.   MRN: [id number]  Patient here for No chief complaint on file.   HPI Here for pre op evaluation. Planning for C5-6 arthroplasty. Saw cardiology 04/11/23. Exercise stress echo with no ischemia 03/2023. Exercises with no complaints of chest pain/pressure. Has known CAD with STEMI in 2022 s/p PCI of proximal LAD. Cardiology felt will be intermediate risk for cervical surgery. No further cardiac evaluation is indicated. Recommended to continue current medical therapy. Continues on low dose lipitor and praluent. Is followed by Dr Donneta Romberg for IDA. Has been receiving venofer on a monthly basis. Has known sleep apnea. Wearing cpap.    Past Medical History:  Diagnosis Date   Atypical nevus 09/07/2008   Left scapula, lateral. Moderate atypia.   Blood in stool    h/o   Cancer (HCC)    thyroid cancer   Dysplastic nevus 12/20/2009   Spinal lower back. Moderate atypia, edges free   Dysplastic nevus 12/20/2009   Left paraspinal lower back. Mild atypia, edges free.   Dysplastic nevus 11/24/2018   Right lower back. Mild atypia, deep margin involved.   Dysplastic nevus 11/24/2018   Right mid back. Moderate atypia and halo nevus effect. Limited margins free.   H/O total thyroidectomy    History of chicken pox    Hypercholesterolemia    Hypothyroidism    Sleep apnea    Thyroid cancer (HCC)    Vertigo    Past Surgical History:  Procedure Laterality Date   CHOLECYSTECTOMY  2000   CORONARY/GRAFT ACUTE MI REVASCULARIZATION N/A 06/16/2020   Procedure: Coronary/Graft Acute MI Revascularization;  Surgeon: Marcina Millard, MD;  Location: [facility] INVASIVE CV LAB;  Service: Cardiovascular;  Laterality: N/A;   LEFT HEART CATH AND CORONARY ANGIOGRAPHY N/A 06/16/2020   Procedure: LEFT HEART CATH AND CORONARY ANGIOGRAPHY;  Surgeon: Marcina Millard, MD;  Location: [facility] INVASIVE CV LAB;  Service: Cardiovascular;  Laterality:  N/A;   TOTAL THYROIDECTOMY  2009   Family History  Problem Relation Age of Onset   Hyperlipidemia Father    Heart disease Father    Social History   Socioeconomic History   Marital status: Married    Spouse name: Not on file   Number of children: Not on file   Years of education: Not on file   Highest education level: Not on file  Occupational History   Not on file  Tobacco Use   Smoking status: Never   Smokeless tobacco: Never  Vaping Use   Vaping status: Never Used  Substance and Sexual Activity   Alcohol use: Yes    Alcohol/week: 0.0 standard drinks of alcohol   Drug use: No   Sexual activity: Not on file  Other Topics Concern   Not on file  Social History Narrative   ** Merged History Encounter **       Social Drivers of Corporate investment banker Strain: Not on file  Food Insecurity: Not on file  Transportation Needs: Not on file  Physical Activity: Not on file  Stress: Not on file  Social Connections: Not on file     Review of Systems     Objective:     There were no vitals taken for this visit. Wt Readings from Last 3 Encounters:  04/09/23 200 lb (90.7 kg)  03/27/23 200 lb (90.7 kg)  03/19/23 200 lb (90.7 kg)    Physical Exam  {Perform Simple Foot Exam  Perform Detailed exam:1} {Insert foot Exam (Optional):30965}   Outpatient Encounter Medications as of 04/29/2023  Medication Sig   Alirocumab (PRALUENT) 150 MG/ML SOAJ Inject 150 mg into the skin every 14 (fourteen) days.   Alirocumab (PRALUENT) 150 MG/ML SOAJ Inject 1 mL (150 mg total) into the skin every 14 (fourteen) days.   Alirocumab (PRALUENT) 150 MG/ML SOAJ Inject 1 mL (150 mg total) into the skin every 14 (fourteen) days.   aspirin 81 MG chewable tablet Chew 1 tablet (81 mg total) by mouth daily.   atorvastatin (LIPITOR) 10 MG tablet Take 1 tablet (10 mg total) by mouth once daily   Cholecalciferol (VITAMIN D) 50 MCG (2000 UT) tablet Take 2,000 Units by mouth daily.   cycloSPORINE  (RESTASIS) 0.05 % ophthalmic emulsion Place 1 drop into both eyes 2 (two) times daily.   gabapentin (NEURONTIN) 100 MG capsule Take 1-3 capsules (100-300 mg total) by mouth at bedtime. Follow written titration schedule.   ISOtretinoin (ACCUTANE) 20 MG capsule Take 1 capsule (20 mg total) by mouth 2 (two) times daily.   ketoconazole (NIZORAL) 2 % shampoo Apply 1 Application topically 3 (three) times a week. Wash scalp 3 times weekly, let sit 5 minutes and rinse out   levothyroxine (SYNTHROID) 175 MCG tablet Take 175 mcg by mouth daily before breakfast.   meloxicam (MOBIC) 15 MG tablet Take 1 tablet (15 mg total) by mouth daily after breakfast. Avoid other NSAIDs   minoxidil (LONITEN) 2.5 MG tablet Take 2 tablets (5 mg total) by mouth daily.   Multiple Vitamins-Minerals (PRESERVISION AREDS 2) CAPS    Ruxolitinib Phosphate (OPZELURA) 1.5 % CREA Apply 1 Application topically daily to areas of vitiligo on body   testosterone cypionate (DEPOTESTOSTERONE CYPIONATE) 200 MG/ML injection Inject 1 mL (200 mg total) into the muscle every 14 (fourteen) days.   valACYclovir (VALTREX) 1000 MG tablet Take 1 tablet (1,000 mg total) by mouth as directed. Take 2 tablets at onset of fever blister symptoms then 2 tablets 12 hours later   No facility-administered encounter medications on file as of 04/29/2023.     Lab Results  Component Value Date   WBC 4.2 03/25/2023   HGB 12.9 (L) 03/25/2023   HCT 38.7 (L) 03/25/2023   PLT 240 03/25/2023   GLUCOSE 104 (H) 03/25/2023   CHOL 165 01/25/2023   TRIG 85.0 01/25/2023   HDL 46.20 01/25/2023   LDLCALC 102 (H) 01/25/2023   ALT 18 01/25/2023   AST 16 01/25/2023   NA 141 03/25/2023   K 4.1 03/25/2023   CL 105 03/25/2023   CREATININE 1.19 03/25/2023   BUN 18 03/25/2023   CO2 27 03/25/2023   TSH 3.55 01/25/2023   PSA 0.57 01/25/2023   INR 1.0 06/16/2020   HGBA1C 5.7 09/04/2019    DG PAIN CLINIC C-ARM 1-60 MIN NO REPORT Result Date: 03/19/2023 Fluoro was used,  but no Radiologist interpretation will be provided. Please refer to "NOTES" tab for provider progress note.      Assessment & Plan:  Pre-operative clearance     Dale Ballard, MD

## 2023-04-29 ENCOUNTER — Ambulatory Visit: Payer: 59 | Admitting: Internal Medicine

## 2023-04-29 ENCOUNTER — Encounter: Payer: Self-pay | Admitting: Internal Medicine

## 2023-04-29 ENCOUNTER — Ambulatory Visit: Payer: 59 | Admitting: Student in an Organized Health Care Education/Training Program

## 2023-04-29 ENCOUNTER — Encounter: Payer: Self-pay | Admitting: Neurosurgery

## 2023-04-29 ENCOUNTER — Other Ambulatory Visit: Payer: Self-pay

## 2023-04-29 ENCOUNTER — Encounter
Admission: RE | Admit: 2023-04-29 | Discharge: 2023-04-29 | Disposition: A | Discharge status: Home / Self Care | Payer: 59 | Source: Ambulatory Visit | Attending: Neurosurgery | Admitting: Neurosurgery

## 2023-04-29 VITALS — BP 136/76 | HR 86 | Temp 98.0°F | Resp 16 | Ht 73.0 in | Wt 207.8 lb

## 2023-04-29 DIAGNOSIS — G4733 Obstructive sleep apnea (adult) (pediatric): Secondary | ICD-10-CM

## 2023-04-29 DIAGNOSIS — M542 Cervicalgia: Secondary | ICD-10-CM

## 2023-04-29 DIAGNOSIS — E7800 Pure hypercholesterolemia, unspecified: Secondary | ICD-10-CM | POA: Diagnosis not present

## 2023-04-29 DIAGNOSIS — Z01812 Encounter for preprocedural laboratory examination: Secondary | ICD-10-CM | POA: Diagnosis present

## 2023-04-29 DIAGNOSIS — I25119 Atherosclerotic heart disease of native coronary artery with unspecified angina pectoris: Secondary | ICD-10-CM | POA: Diagnosis not present

## 2023-04-29 DIAGNOSIS — Z85850 Personal history of malignant neoplasm of thyroid: Secondary | ICD-10-CM

## 2023-04-29 DIAGNOSIS — Z01818 Encounter for other preprocedural examination: Secondary | ICD-10-CM

## 2023-04-29 DIAGNOSIS — E611 Iron deficiency: Secondary | ICD-10-CM

## 2023-04-29 HISTORY — DX: Iron deficiency anemia, unspecified: D50.9

## 2023-04-29 HISTORY — DX: Gastrointestinal hemorrhage, unspecified: K92.2

## 2023-04-29 HISTORY — DX: Testicular hypofunction: E29.1

## 2023-04-29 HISTORY — DX: Postprocedural hypothyroidism: E89.0

## 2023-04-29 HISTORY — DX: Vitamin D deficiency, unspecified: E55.9

## 2023-04-29 HISTORY — DX: Nonspecific intraventricular block: I45.4

## 2023-04-29 HISTORY — DX: Other ill-defined heart diseases: I51.89

## 2023-04-29 HISTORY — DX: Mixed hyperlipidemia: E78.2

## 2023-04-29 HISTORY — DX: Atherosclerosis of aorta: I70.0

## 2023-04-29 HISTORY — DX: Radiculopathy, lumbar region: M54.16

## 2023-04-29 HISTORY — DX: Obstructive sleep apnea (adult) (pediatric): G47.33

## 2023-04-29 LAB — TYPE AND SCREEN
ABO/RH(D): O POS
Antibody Screen: NEGATIVE
Extend sample reason: UNDETERMINED

## 2023-04-29 LAB — BASIC METABOLIC PANEL
BUN: 18 mg/dL (ref 6–23)
CO2: 29 meq/L (ref 19–32)
Calcium: 8.9 mg/dL (ref 8.4–10.5)
Chloride: 103 meq/L (ref 96–112)
Creatinine, Ser: 1.27 mg/dL (ref 0.40–1.50)
GFR: 59.66 mL/min — ABNORMAL LOW (ref >60.00)
Glucose, Bld: 94 mg/dL (ref 70–99)
Potassium: 4 meq/L (ref 3.5–5.1)
Sodium: 140 meq/L (ref 135–145)

## 2023-04-29 LAB — SURGICAL PCR SCREEN
MRSA, PCR: NEGATIVE
Staphylococcus aureus: NEGATIVE

## 2023-04-29 LAB — CBC WITH DIFFERENTIAL/PLATELET
Basophils Absolute: 0.1 10*3/uL (ref 0.0–0.1)
Basophils Relative: 0.9 % (ref 0.0–3.0)
Eosinophils Absolute: 0.2 10*3/uL (ref 0.0–0.7)
Eosinophils Relative: 3.7 % (ref 0.0–5.0)
HCT: 42.9 % (ref 39.0–52.0)
Hemoglobin: 14.1 g/dL (ref 13.0–17.0)
Lymphocytes Relative: 22.7 % (ref 12.0–46.0)
Lymphs Abs: 1.3 10*3/uL (ref 0.7–4.0)
MCHC: 32.8 g/dL (ref 30.0–36.0)
MCV: 83.8 fL (ref 78.0–100.0)
Monocytes Absolute: 0.7 10*3/uL (ref 0.1–1.0)
Monocytes Relative: 11.9 % (ref 3.0–12.0)
Neutro Abs: 3.4 10*3/uL (ref 1.4–7.7)
Neutrophils Relative %: 60.8 % (ref 43.0–77.0)
Platelets: 219 10*3/uL (ref 150.0–400.0)
RBC: 5.12 Mil/uL (ref 4.22–5.81)
RDW: 14.8 % (ref 11.5–15.5)
WBC: 5.7 10*3/uL (ref 4.0–10.5)

## 2023-04-29 NOTE — Assessment & Plan Note (Signed)
Continue cpap. Instructed to take to the hospital if has to spend the night. Doing well with cpap.

## 2023-04-29 NOTE — Assessment & Plan Note (Signed)
Followed by Dr South. On synthroid.  Follow tsh.  

## 2023-04-29 NOTE — Patient Instructions (Addendum)
Your procedure is scheduled on: Wednesday, January 22 Report to the Registration Desk on the 1st floor of the [facility] To find out your arrival time, please call [phone] between 1PM - 3PM on: Tuesday, January 21 If your arrival time is 6:00 am, do not arrive before that time as the [facility] entrance doors do not open until 6:00 am.  REMEMBER: Instructions that are not followed completely may result in serious medical risk, up to and including death; or upon the discretion of your surgeon and anesthesiologist your surgery may need to be rescheduled.  Do not eat food after midnight the night before surgery.  No gum chewing or hard candies.  You may however, drink CLEAR liquids up to 2 hours before you are scheduled to arrive for your surgery. Do not drink anything within 2 hours of your scheduled arrival time.  Clear liquids include: - water  - apple juice without pulp - gatorade (not RED colors) - black coffee or tea (Do NOT add milk or creamers to the coffee or tea) Do NOT drink anything that is not on this list.  One week prior to surgery: Stop ANY OVER THE COUNTER supplements until after surgery. Stop preservision Areds.  You may however, continue to take Tylenol if needed for pain up until the day of surgery.  Per Dr. Lucienne Capers instructions; continue taking the aspirin 81 mg.  Continue taking all of your other prescription medications up until the day of surgery.  ON THE DAY OF SURGERY ONLY TAKE THESE MEDICATIONS WITH SIPS OF WATER:  Levothyroxine (Synthroid)  No Alcohol for 24 hours before or after surgery.  No Smoking including e-cigarettes for 24 hours before surgery.  No chewable tobacco products for at least 6 hours before surgery.  No nicotine patches on the day of surgery.  Do not use any "recreational" drugs for at least a week (preferably 2 weeks) before your surgery.  Please be advised that the combination of cocaine and anesthesia may have  negative outcomes, up to and including death. If you test positive for cocaine, your surgery will be cancelled.  On the morning of surgery brush your teeth with toothpaste and water, you may rinse your mouth with mouthwash if you wish. Do not swallow any toothpaste or mouthwash.  Use CHG Soap as directed on instruction sheet.  Do not wear jewelry, make-up, hairpins, clips or nail polish.  For welded (permanent) jewelry: bracelets, anklets, waist bands, etc.  Please have this removed prior to surgery.  If it is not removed, there is a chance that hospital personnel will need to cut it off on the day of surgery.  Do not wear lotions, powders, or perfumes.   Do not shave body hair from the neck down 48 hours before surgery.  Contact lenses, hearing aids and dentures may not be worn into surgery.  Do not bring valuables to the hospital. [facility] is not responsible for any missing/lost belongings or valuables.   Bring your C-PAP to the hospital in case you may have to spend the night.   Notify your doctor if there is any change in your medical condition (cold, fever, infection).  Wear comfortable clothing (specific to your surgery type) to the hospital.  After surgery, you can help prevent lung complications by doing breathing exercises.  Take deep breaths and cough every 1-2 hours.   If you are being discharged the day of surgery, you will not be allowed to drive home. You will need a  responsible individual to drive you home and stay with you for 24 hours after surgery.   If you are taking public transportation, you will need to have a responsible individual with you.  Please call the Pre-admissions Testing Dept. at [phone] if you have any questions about these instructions.  Surgery Visitation Policy:  Patients having surgery or a procedure may have two visitors.  Children under the age of 23 must have an adult with them who is not the patient.  Temporary Visitor  Restrictions Due to increasing cases of flu, RSV and COVID-19: Children ages 76 and under will not be able to visit patients in [facility] hospitals under most circumstances.       Pre-operative 5 CHG Bath Instructions   You can play a key role in reducing the risk of infection after surgery. Your skin needs to be as free of germs as possible. You can reduce the number of germs on your skin by washing with CHG (chlorhexidine gluconate) soap before surgery. CHG is an antiseptic soap that kills germs and continues to kill germs even after washing.   DO NOT use if you have an allergy to chlorhexidine/CHG or antibacterial soaps. If your skin becomes reddened or irritated, stop using the CHG and notify one of our RNs at [phone].   Please shower with the CHG soap starting 4 days before surgery using the following schedule:     Please keep in mind the following:  DO NOT shave, including legs and underarms, starting the day of your first shower.   You may shave your face at any point before/day of surgery.  Place clean sheets on your bed the day you start using CHG soap. Use a clean washcloth (not used since being washed) for each shower. DO NOT sleep with pets once you start using the CHG.   CHG Shower Instructions:  If you choose to wash your hair and private area, wash first with your normal shampoo/soap.  After you use shampoo/soap, rinse your hair and body thoroughly to remove shampoo/soap residue.  Turn the water OFF and apply about 3 tablespoons (45 ml) of CHG soap to a CLEAN washcloth.  Apply CHG soap ONLY FROM YOUR NECK DOWN TO YOUR TOES (washing for 3-5 minutes)  DO NOT use CHG soap on face, private areas, open wounds, or sores.  Pay special attention to the area where your surgery is being performed.  If you are having back surgery, having someone wash your back for you may be helpful. Wait 2 minutes after CHG soap is applied, then you may rinse off the CHG soap.  Pat dry  with a clean towel  Put on clean clothes/pajamas   If you choose to wear lotion, please use ONLY the CHG-compatible lotions on the back of this paper.     Additional instructions for the day of surgery: DO NOT APPLY any lotions, deodorants, cologne, or perfumes.   Put on clean/comfortable clothes.  Brush your teeth.  Ask your nurse before applying any prescription medications to the skin.      CHG Compatible Lotions   Aveeno Moisturizing lotion  Cetaphil Moisturizing Cream  Cetaphil Moisturizing Lotion  Clairol Herbal Essence Moisturizing Lotion, Dry Skin  Clairol Herbal Essence Moisturizing Lotion, Extra Dry Skin  Clairol Herbal Essence Moisturizing Lotion, Normal Skin  Curel Age Defying Therapeutic Moisturizing Lotion with Alpha Hydroxy  Curel Extreme Care Body Lotion  Curel Soothing Hands Moisturizing Hand Lotion  Curel Therapeutic Moisturizing Cream, Fragrance-Free  Curel  Therapeutic Moisturizing Lotion, Fragrance-Free  Curel Therapeutic Moisturizing Lotion, Original Formula  Eucerin Daily Replenishing Lotion  Eucerin Dry Skin Therapy Plus Alpha Hydroxy Crme  Eucerin Dry Skin Therapy Plus Alpha Hydroxy Lotion  Eucerin Original Crme  Eucerin Original Lotion  Eucerin Plus Crme Eucerin Plus Lotion  Eucerin TriLipid Replenishing Lotion  Keri Anti-Bacterial Hand Lotion  Keri Deep Conditioning Original Lotion Dry Skin Formula Softly Scented  Keri Deep Conditioning Original Lotion, Fragrance Free Sensitive Skin Formula  Keri Lotion Fast Absorbing Fragrance Free Sensitive Skin Formula  Keri Lotion Fast Absorbing Softly Scented Dry Skin Formula  Keri Original Lotion  Keri Skin Renewal Lotion Keri Silky Smooth Lotion  Keri Silky Smooth Sensitive Skin Lotion  Nivea Body Creamy Conditioning Oil  Nivea Body Extra Enriched Teacher, adult education Moisturizing Lotion Nivea Crme  Nivea Skin Firming Lotion  NutraDerm 30 Skin Lotion  NutraDerm Skin  Lotion  NutraDerm Therapeutic Skin Cream  NutraDerm Therapeutic Skin Lotion  ProShield Protective Hand Cream  Provon moisturizing lotion

## 2023-04-29 NOTE — Assessment & Plan Note (Signed)
Followed by Dr Donneta Romberg.  Check cbc prior to surgery.

## 2023-04-29 NOTE — Assessment & Plan Note (Signed)
History of anterior STEMI s/p PCI/DES of proximal LAD.  Continue aspirin and praluent and lipitor. Saw cardiology 04/11/23. Exercise stress echo with no ischemia 03/2023. Exercises with no complaints of chest pain/pressure. Has known CAD with STEMI in 2022 s/p PCI of proximal LAD. Cardiology felt will be intermediate risk for cervical surgery. No further cardiac evaluation is indicated. Recommended to continue current medical therapy. Continues on low dose lipitor and praluent.  See cardiology recommendation for cardiology clearance.

## 2023-04-29 NOTE — Progress Notes (Incomplete)
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 Clinical nursing staff has updated patient's PMH/PSHx, current medication list, and drug allergies/intolerances to ensure comprehensive history available to assist in medical decision making as it pertains to the aforementioned surgical procedure and anticipated anesthetic course. Extensive review of available clinical information personally performed. [place] PMH and PSHx updated with any diagnoses/procedures that  may have been inadvertently omitted during his intake with the pre-admission testing department's nursing staff.  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 Patient has never been a smoker in the past. Pertinent PMH includes: CAD, anterior STEMI, diastolic dysfunction, rate-related LBBB, aortic atherosclerosis, HTN, HLD, postoperative hypothyroidism (s/p total thyroidectomy), OSAH (requires nocturnal PAP therapy), IDA, hypogonadism (on TRT injections), cervical DDD with acute disc herniation, lumbar radiculopathy.    Patient is followed by cardiology Corky Sing, MD). He was last seen in the cardiology clinic on 04/11/2023; notes reviewed. At the time of his clinic visit, patient doing well overall from a cardiovascular perspective. Patient denied any  chest pain, shortness of breath, PND, orthopnea, palpitations, significant peripheral edema, weakness, fatigue, vertiginous symptoms, or presyncope/syncope. Patient with a past medical history significant for cardiovascular diagnoses. Documented physical exam was grossly benign, providing no evidence of acute exacerbation and/or decompensation of the patient's known cardiovascular conditions.  Patient suffered an anterior STEMI on 06/16/2020.  Diagnostic LEFT heart catheterization was performed revealing multivessel CAD; 40% OM 2, 99% proximal LAD, 50% mid LAD, and 30% proximal RCA.  PCI was subsequently performed placing a 3.0 x 26 mm Resolute Onyx DES x 1 to the proximal LAD lesion.  Procedure yielded excellent angiographic result and TIMI-3 flow.  TTE performed on 09/08/2020 revealed a normal left ventricular systolic function with an EF of >55%.  There were no regional wall motion abnormalities. Left ventricular diastolic Doppler parameters consistent with abnormal relaxation (G1DD).  Right ventricular size and function normal.  There was trivial mitral, tricuspid, and pulmonary valve regurgitation.  RVSP 22.9 mmHg. All transvalvular gradients were noted to be normal providing no evidence suggestive of valvular stenosis. Aorta normal in size with no evidence of ectasia or aneurysmal dilatation.  Cardiac MRI performed on 11/25/2020 revealed a normal left ventricular systolic function with an EF of 62%.  There were no regional wall motion abnormalities.  Right atrium was mildly enlarged.  Subendocardial infarction (<25% transmural) involving the mid to distal anteroseptal wall including the true apex and distal inferior wall was noted.  Findings were consistent with the perfusion territory of the mid to distal LAD.  Stress echocardiogram performed on 03/15/2023 demonstrated excellent exercise capacity with patient achieving 13.4 METS.  Patient with exercise/stress-induced LBBB at peak exercise.   Echocardiographic finding not associated with any chest pain or shortness of breath.  Doppler for diastolic function and assessment of the cardiac valves was  not performed.  Blood pressure reasonably controlled at 130/86 mmHg without the use of pharmacological interventions. Patient is on atorvastatin + PCSK9i (alirocumab) for his HLD diagnosis and ASCVD prevention. Patient is not diabetic.  In the setting of this patient with known cardiovascular diagnoses, is important to note that patient is on exogenous testosterone injections (depotestosterone cypionate).  He does have an OSAH diagnosis and is reportedly compliant with prescribed nocturnal PAP therapy.  Patient maintains an active lifestyle. Patient is able to complete all of his  ADL/IADLs without cardiovascular limitation.  Per the DASI, patient is able to achieve at least 4 METS of physical activity without experiencing any significant degree of angina/anginal equivalent symptoms.  No changes were made to his medication regimen during his visit with cardiology.  Patient scheduled to follow-up with outpatient cardiology in 6 months or sooner if needed.  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 Venetia Night, MD. Given patient's past medical history significant for cardiovascular diagnoses, presurgical cardiac clearance was sought by the PAT team. Per cardiology, "patient with no anginal symptoms, euvolemic on exam. Recent stress test with no ischemia, low risk study. He will be at INTERMEDIATE risk for cervical surgery. No further cardiac evaluation is indicated. He is optimized from cardiac standpoint. Continue current medical therapy".  In review of the patient's chart, it is noted that he is on daily oral antithrombotic therapy. Given his history of coronary artery disease and STEMI, neurosurgery has cleared patient to continue his daily low dose ASA throughout his perioperative course.   Patient  denies previous perioperative complications with anesthesia in the past. In review his EMR, it is noted that patient underwent a general anesthetic course at [facility] ([facility] III) in 06/2021 without documented complications.      04/29/2023    9:29 AM 04/29/2023    7:09 AM 04/09/2023    2:01 PM  Vitals with BMI  Height 6\' 1"  6\' 1"  6\' 1"   Weight 208 lbs 207 lbs 13 oz 200 lbs  BMI 27.45 27.42 26.39  Systolic 154 136 132  Diastolic 91 76 92  Pulse 82 86    Providers/Specialists:  NOTE: Primary physician provider listed below. Patient may have been seen by APP or partner within same practice.   PROVIDER ROLE / SPECIALTY LAST Donalynn Furlong, MD Neurosurgery (Surgeon) 04/09/2023  Dale Hackensack, MD Primary Care Provider 04/29/2023  Windell Norfolk, MD Cardiology 04/11/2023   Allergies:   Allergies  Allergen Reactions   Halcion [Triazolam] Other (See Comments)    Moody,angry   Current Home Medications:   No current facility-administered medications for this encounter.    Alirocumab (PRALUENT) 150 MG/ML SOAJ   Alirocumab (PRALUENT) 150 MG/ML SOAJ   Alirocumab (PRALUENT) 150 MG/ML SOAJ   aspirin 81 MG chewable tablet   atorvastatin (LIPITOR) 10 MG tablet   Cholecalciferol (VITAMIN D) 50 MCG (2000 UT) tablet   cycloSPORINE (RESTASIS) 0.05 % ophthalmic emulsion   gabapentin (NEURONTIN) 100 MG capsule   ketoconazole (NIZORAL) 2 % shampoo   levothyroxine (SYNTHROID) 175 MCG tablet   meloxicam (MOBIC) 7.5 MG tablet   minoxidil (LONITEN) 2.5 MG tablet   Multiple Vitamins-Minerals (PRESERVISION AREDS 2) CAPS   Ruxolitinib Phosphate (OPZELURA) 1.5 % CREA   testosterone cypionate (DEPOTESTOSTERONE CYPIONATE) 200 MG/ML injection   valACYclovir (VALTREX) 1000 MG tablet   History:   Past Medical History:  Diagnosis Date   Aortic atherosclerosis (HCC)    Atypical nevus  09/07/2008   Left scapula, lateral. Moderate atypia.   Blood in stool    Coronary  artery disease involving native coronary artery of native heart without angina pectoris 06/16/2020   a.) anterior STEMI 06/16/2020 --> LHC/PCI: 40% OM2, 99% pLAD (3.0 x 26 mm Resolute Onyx DES), 50% mLAD, 30% pRCA; b.) cMRI 11/25/2020: EF 62%, subendocardial infarction (< 25% transmural) involving m-d anteroseptal wall involving the true apex and distal inf wall   DDD (degenerative disc disease), cervical    Detached retina, left 2010   Diastolic dysfunction    a.) TTE 09/08/2020: EF >55%, no RWMAs, G1DD, norm RVSF, triv MR/TR/PR; b.) stress TTE 03/15/2023: LBBB with peak stress, max workload 13.4 METS, dias function/doppler for regurg/stenosis not performed   Dysplastic nevus 12/20/2009   Spinal lower back. Moderate atypia, edges free   Dysplastic nevus 12/20/2009   Left paraspinal lower back. Mild atypia, edges free.   Dysplastic nevus 11/24/2018   Right lower back. Mild atypia, deep margin involved.   Dysplastic nevus 11/24/2018   Right mid back. Moderate atypia and halo nevus effect. Limited margins free.   GI bleed    Herniation of cervical intervertebral disc with radiculopathy 12/2022   a.) secondary to exercise/regimen with personal trainer   History of chicken pox    Hypercholesterolemia    Hyperlipidemia, mixed    Hypogonadism in male    a.) on exogenous TRT (testosteronew cypionate) injections   Hypothyroidism, postsurgical    Iron deficiency anemia    Obstructive sleep apnea on CPAP    Radiculopathy, lumbar region    Rate-related  LEFT bundle branch block    ST elevation myocardial infarction (STEMI) of anterior wall (HCC) 11/25/2020   a.) LHC/PCI: 40% OM2, 99% pLAD (3.0 x 26 mm Resolute Onyx DES), 50% mLAD, 30% pRCA   Thyroid cancer (HCC) 2009   a.) s/p total thyroidectomy   Vertigo    Vitamin D deficiency    Past Surgical History:  Procedure Laterality Date   CATARACT EXTRACTION W/ INTRAOCULAR LENS  IMPLANT, BILATERAL     CHOLECYSTECTOMY  04/09/1998   COLONOSCOPY      1998, 2010   COLONOSCOPY WITH ESOPHAGOGASTRODUODENOSCOPY (EGD)  05/29/2018   COLONOSCOPY WITH ESOPHAGOGASTRODUODENOSCOPY (EGD)  01/26/2022   CORONARY/GRAFT ACUTE MI REVASCULARIZATION N/A 06/16/2020   Procedure: Coronary/Graft Acute MI Revascularization;  Surgeon: Marcina Millard, MD;  Location: [facility] INVASIVE CV LAB;  Service: Cardiovascular;  Laterality: N/A;   LEFT HEART CATH AND CORONARY ANGIOGRAPHY N/A 06/16/2020   Procedure: LEFT HEART CATH AND CORONARY ANGIOGRAPHY;  Surgeon: Marcina Millard, MD;  Location: [facility] INVASIVE CV LAB;  Service: Cardiovascular;  Laterality: N/A;   LYMPH NODE DISSECTION  06/13/2007   NASAL SEPTUM SURGERY     RETINAL DETACHMENT SURGERY Left    TOTAL THYROIDECTOMY  06/13/2007   Family History  Problem Relation Age of Onset   Hyperlipidemia Father    Heart disease Father    Social History   Tobacco Use   Smoking status: Never   Smokeless tobacco: Never  Substance Use Topics   Alcohol use: Yes    Comment: weekly   Pertinent Clinical Results:  LABS:  Hospital Outpatient Visit on 04/29/2023  Component Date Value Ref Range Status   MRSA, PCR 04/29/2023 NEGATIVE  NEGATIVE Final   Staphylococcus aureus 04/29/2023 NEGATIVE  NEGATIVE Final   Comment: (NOTE) The Xpert SA Assay (FDA approved for NASAL specimens in patients 54 years of age and older), is one component of a comprehensive surveillance program. It  is not intended to diagnose infection nor to guide or monitor treatment. Performed at [facility], [address] [place], [place] [address]    ABO/RH(D) 04/29/2023 O POS   Final   Antibody Screen 04/29/2023 NEG   Final   Sample Expiration 04/29/2023 05/02/2023,2359   Final   Extend sample reason 04/29/2023    Final                   Value:PTHF FORM INCOMPLETE, UNABLE TO EXTEND Performed at [facility], [address] [place] [place], [place] [address]   Office Visit on 04/29/2023  Component Date Value Ref Range  Status   WBC 04/29/2023 5.7  4.0 - 10.5 K/uL Final   RBC 04/29/2023 5.12  4.22 - 5.81 Mil/uL Final   Hemoglobin 04/29/2023 14.1  13.0 - 17.0 g/dL Final   HCT 95/28/4132 42.9  39.0 - 52.0 % Final   MCV 04/29/2023 83.8  78.0 - 100.0 fl Final   MCHC 04/29/2023 32.8  30.0 - 36.0 g/dL Final   RDW 44/04/270 14.8  11.5 - 15.5 % Final   Platelets 04/29/2023 219.0  150.0 - 400.0 K/uL Final   Neutrophils Relative % 04/29/2023 60.8  43.0 - 77.0 % Final   Lymphocytes Relative 04/29/2023 22.7  12.0 - 46.0 % Final   Monocytes Relative 04/29/2023 11.9  3.0 - 12.0 % Final   Eosinophils Relative 04/29/2023 3.7  0.0 - 5.0 % Final   Basophils Relative 04/29/2023 0.9  0.0 - 3.0 % Final   Neutro Abs 04/29/2023 3.4  1.4 - 7.7 K/uL Final   Lymphs Abs 04/29/2023 1.3  0.7 - 4.0 K/uL Final   Monocytes Absolute 04/29/2023 0.7  0.1 - 1.0 K/uL Final   Eosinophils Absolute 04/29/2023 0.2  0.0 - 0.7 K/uL Final   Basophils Absolute 04/29/2023 0.1  0.0 - 0.1 K/uL Final   Sodium 04/29/2023 140  135 - 145 mEq/L Final   Potassium 04/29/2023 4.0  3.5 - 5.1 mEq/L Final   Chloride 04/29/2023 103  96 - 112 mEq/L Final   CO2 04/29/2023 29  19 - 32 mEq/L Final   Glucose, Bld 04/29/2023 94  70 - 99 mg/dL Final   BUN 53/66/4403 18  6 - 23 mg/dL Final   Creatinine, Ser 04/29/2023 1.27  0.40 - 1.50 mg/dL Final   GFR 47/42/5956 59.66 (L)  >60.00 mL/min Final   Calculated using the CKD-EPI Creatinine Equation (2021)   Calcium 04/29/2023 8.9  8.4 - 10.5 mg/dL Final    ECG: Date: 38/75/6433  Time ECG obtained: 0935 AM Rate: 76 bpm Rhythm: normal sinus Axis (leads I and aVF): normal Intervals: PR 174 ms. QRS 88 ms. QTc 425 ms. ST segment and T wave changes: No evidence of acute T wave abnormalities or significant ST segment elevation or depression.  Comparison: Similar to previous tracing obtained on 06/07/2021   IMAGING / PROCEDURES: STRESS TRANSTHORACIC ECHOCARDIOGRAM performed on 03/15/2023 Normal resting study with no  wall motion abnormalities at rest and peak exercise.  Maximum workload of 13.4 METs was achieved during exercise.  Diastolic function not assessed  No doppler performed for valvular regurgitation  No doppler performed for valvular stenosis   MR CERVICAL SPINE WO CONTRAST performed on 03/01/2023 Multilevel cervical spondylosis, worst at C5-C6, where there is moderate spinal canal stenosis and severe bilateral neural foraminal narrowing. Moderate left neural foraminal narrowing at C2-C3 and moderate right neural foraminal narrowing at C4-C5. Partially imaged T2 hyperintense lesion in the pharynx along  the right aspect of the soft palate, measuring up to 4.4 cm. Recommend further evaluation with direct visualization and contrast-enhanced neck CT. Outside prior imaging was obtained for comparison. Outside MRI of the head/face and contrast-enhanced MRA of the neck dated 12/01/2010 demonstrates a trans-spatial, T2 hyperintense lesion centered within the right oropharynx and prestyloid parapharyngeal space, measuring up to 4.3 x 2.5 cm. There are dilated veins along the lesion, but no significant arterial component on MRA. 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 Available labs, pertinent testing, and imaging results were personally reviewed by me in preparation for upcoming  operative/procedural course. [facility] medical record has been updated following extensive record review and patient interview with PAT staff.   This patient has been appropriately cleared by cardiology with an overall intermediate risk of experiencing significant perioperative cardiovascular complications. Based on clinical review performed today (04/30/23), barring any significant acute changes in the patient's overall condition, it is anticipated that he will be able to proceed with the planned surgical intervention. Any acute changes in clinical condition may necessitate his procedure being postponed and/or cancelled. Patient will meet with anesthesia team (MD and/or CRNA) on the day of his procedure for preoperative evaluation/assessment. Questions regarding anesthetic course will be fielded at that time.   Pre-surgical instructions were reviewed with the patient during his PAT appointment, and questions were fielded to satisfaction by PAT clinical staff. He has been instructed on which medications that he will need to hold prior to surgery, as well as the ones that have been deemed safe/appropriate to take on the day of his procedure. As part of the general education provided by PAT, patient made aware both verbally and in writing, that he would need to abstain from the use of any illegal substances during his perioperative course. He was advised that failure to follow the provided instructions could necessitate case cancellation or result in serious perioperative complications up to and including death. Patient encouraged to contact PAT and/or his surgeon's office to discuss any questions or concerns that may arise prior to surgery; verbalized understanding.   Quentin Mulling, MSN, APRN, FNP-C, CEN [facility]  Perioperative Services Nurse Practitioner Phone: [phone] Fax: [phone] 04/30/23 9:30 AM  NOTE: This note has been prepared using Dragon dictation software.  Despite my best ability to proofread, there is always the potential that unintentional transcriptional errors may still occur from this process.

## 2023-04-29 NOTE — Assessment & Plan Note (Signed)
On praluent.  Low cholesterol diet and exercise.  Also on lipitor.

## 2023-04-29 NOTE — Assessment & Plan Note (Signed)
Planning for C5-6 arthroplasty. He has had persistent increased pain - neck and numbness thumbs. Reports pain has been present since injury while working out - 12/2022.  Surgery planned this week. Will check cbc and met b. Form completed.

## 2023-04-30 ENCOUNTER — Encounter: Payer: Self-pay | Admitting: Neurosurgery

## 2023-05-01 ENCOUNTER — Ambulatory Visit
Admission: RE | Admit: 2023-05-01 | Discharge: 2023-05-01 | Disposition: A | Discharge status: Home / Self Care | Payer: 59 | Attending: Neurosurgery | Admitting: Neurosurgery

## 2023-05-01 ENCOUNTER — Encounter
Admission: RE | Disposition: A | Discharge status: Home / Self Care | Payer: Self-pay | Source: Home / Self Care | Attending: Neurosurgery

## 2023-05-01 ENCOUNTER — Ambulatory Visit: Payer: 59 | Admitting: Urgent Care

## 2023-05-01 ENCOUNTER — Ambulatory Visit: Payer: 59

## 2023-05-01 ENCOUNTER — Other Ambulatory Visit: Payer: Self-pay

## 2023-05-01 ENCOUNTER — Ambulatory Visit: Payer: Self-pay | Admitting: Urgent Care

## 2023-05-01 ENCOUNTER — Encounter: Payer: Self-pay | Admitting: Neurosurgery

## 2023-05-01 DIAGNOSIS — Z85850 Personal history of malignant neoplasm of thyroid: Secondary | ICD-10-CM | POA: Diagnosis not present

## 2023-05-01 DIAGNOSIS — M4722 Other spondylosis with radiculopathy, cervical region: Secondary | ICD-10-CM | POA: Diagnosis not present

## 2023-05-01 DIAGNOSIS — M4802 Spinal stenosis, cervical region: Secondary | ICD-10-CM | POA: Diagnosis present

## 2023-05-01 DIAGNOSIS — I252 Old myocardial infarction: Secondary | ICD-10-CM | POA: Insufficient documentation

## 2023-05-01 DIAGNOSIS — I2510 Atherosclerotic heart disease of native coronary artery without angina pectoris: Secondary | ICD-10-CM | POA: Insufficient documentation

## 2023-05-01 DIAGNOSIS — Z01818 Encounter for other preprocedural examination: Secondary | ICD-10-CM

## 2023-05-01 DIAGNOSIS — I447 Left bundle-branch block, unspecified: Secondary | ICD-10-CM | POA: Insufficient documentation

## 2023-05-01 DIAGNOSIS — Z951 Presence of aortocoronary bypass graft: Secondary | ICD-10-CM | POA: Insufficient documentation

## 2023-05-01 DIAGNOSIS — Z87891 Personal history of nicotine dependence: Secondary | ICD-10-CM | POA: Diagnosis not present

## 2023-05-01 DIAGNOSIS — M5412 Radiculopathy, cervical region: Secondary | ICD-10-CM | POA: Diagnosis not present

## 2023-05-01 DIAGNOSIS — I700 Atherosclerosis of aorta: Secondary | ICD-10-CM | POA: Insufficient documentation

## 2023-05-01 DIAGNOSIS — E782 Mixed hyperlipidemia: Secondary | ICD-10-CM | POA: Diagnosis not present

## 2023-05-01 DIAGNOSIS — Z01812 Encounter for preprocedural laboratory examination: Secondary | ICD-10-CM

## 2023-05-01 DIAGNOSIS — G4733 Obstructive sleep apnea (adult) (pediatric): Secondary | ICD-10-CM | POA: Insufficient documentation

## 2023-05-01 DIAGNOSIS — Z4682 Encounter for fitting and adjustment of non-vascular catheter: Secondary | ICD-10-CM | POA: Diagnosis not present

## 2023-05-01 HISTORY — PX: CERVICAL DISC ARTHROPLASTY: SHX587

## 2023-05-01 HISTORY — DX: Other cervical disc degeneration, unspecified cervical region: M50.30

## 2023-05-01 LAB — ABO/RH: ABO/RH(D): O POS

## 2023-05-01 SURGERY — CERVICAL ANTERIOR DISC ARTHROPLASTY
Anesthesia: Spinal | Site: Spine Cervical

## 2023-05-01 MED ORDER — KETAMINE HCL 50 MG/5ML IJ SOSY
PREFILLED_SYRINGE | INTRAMUSCULAR | Status: AC
  Filled 2023-05-01: qty 5

## 2023-05-01 MED ORDER — PROPOFOL 10 MG/ML IV BOLUS
INTRAVENOUS | Status: AC
  Filled 2023-05-01: qty 20

## 2023-05-01 MED ORDER — METHOCARBAMOL 500 MG PO TABS
500.0000 mg | ORAL_TABLET | Freq: Four times a day (QID) | ORAL | 0 refills | Status: DC | PRN
  Filled 2023-05-01: qty 60, 15d supply, fill #0

## 2023-05-01 MED ORDER — PHENYLEPHRINE HCL-NACL 20-0.9 MG/250ML-% IV SOLN
INTRAVENOUS | Status: DC | PRN
  Administered 2023-05-01: 40 ug/min via INTRAVENOUS

## 2023-05-01 MED ORDER — PHENYLEPHRINE 80 MCG/ML (10ML) SYRINGE FOR IV PUSH (FOR BLOOD PRESSURE SUPPORT)
PREFILLED_SYRINGE | INTRAVENOUS | Status: DC | PRN
  Administered 2023-05-01 (×4): 80 ug via INTRAVENOUS
  Administered 2023-05-01: 160 ug via INTRAVENOUS

## 2023-05-01 MED ORDER — SURGIFLO WITH THROMBIN (HEMOSTATIC MATRIX KIT) OPTIME
TOPICAL | Status: DC | PRN
  Administered 2023-05-01: 1 via TOPICAL

## 2023-05-01 MED ORDER — DEXAMETHASONE SODIUM PHOSPHATE 10 MG/ML IJ SOLN
INTRAMUSCULAR | Status: AC
  Filled 2023-05-01: qty 1

## 2023-05-01 MED ORDER — REMIFENTANIL HCL 1 MG IV SOLR
INTRAVENOUS | Status: AC
  Filled 2023-05-01: qty 1000

## 2023-05-01 MED ORDER — MIDAZOLAM HCL 2 MG/2ML IJ SOLN
INTRAMUSCULAR | Status: AC
  Filled 2023-05-01: qty 2

## 2023-05-01 MED ORDER — LIDOCAINE HCL (PF) 2 % IJ SOLN
INTRAMUSCULAR | Status: AC
  Filled 2023-05-01: qty 5

## 2023-05-01 MED ORDER — KETAMINE HCL 50 MG/5ML IJ SOSY
PREFILLED_SYRINGE | INTRAMUSCULAR | Status: DC | PRN
  Administered 2023-05-01: 30 mg via INTRAVENOUS

## 2023-05-01 MED ORDER — CEFAZOLIN SODIUM-DEXTROSE 2-4 GM/100ML-% IV SOLN
INTRAVENOUS | Status: AC
  Filled 2023-05-01: qty 100

## 2023-05-01 MED ORDER — PHENYLEPHRINE 80 MCG/ML (10ML) SYRINGE FOR IV PUSH (FOR BLOOD PRESSURE SUPPORT)
PREFILLED_SYRINGE | INTRAVENOUS | Status: AC
  Filled 2023-05-01: qty 10

## 2023-05-01 MED ORDER — EPHEDRINE SULFATE-NACL 50-0.9 MG/10ML-% IV SOSY
PREFILLED_SYRINGE | INTRAVENOUS | Status: DC | PRN
  Administered 2023-05-01 (×3): 5 mg via INTRAVENOUS

## 2023-05-01 MED ORDER — OXYCODONE HCL 5 MG PO TABS
5.0000 mg | ORAL_TABLET | ORAL | 0 refills | Status: AC | PRN
  Filled 2023-05-01: qty 20, 4d supply, fill #0

## 2023-05-01 MED ORDER — BUPIVACAINE-EPINEPHRINE (PF) 0.5% -1:200000 IJ SOLN
INTRAMUSCULAR | Status: DC | PRN
  Administered 2023-05-01: 8 mL

## 2023-05-01 MED ORDER — OXYCODONE HCL 5 MG PO TABS
ORAL_TABLET | ORAL | Status: AC
  Filled 2023-05-01: qty 1

## 2023-05-01 MED ORDER — ACETAMINOPHEN 10 MG/ML IV SOLN
INTRAVENOUS | Status: AC
  Filled 2023-05-01: qty 100

## 2023-05-01 MED ORDER — FENTANYL CITRATE (PF) 100 MCG/2ML IJ SOLN
INTRAMUSCULAR | Status: DC | PRN
  Administered 2023-05-01 (×3): 50 ug via INTRAVENOUS

## 2023-05-01 MED ORDER — BUPIVACAINE-EPINEPHRINE (PF) 0.5% -1:200000 IJ SOLN
INTRAMUSCULAR | Status: AC
  Filled 2023-05-01: qty 30

## 2023-05-01 MED ORDER — PROPOFOL 10 MG/ML IV BOLUS
INTRAVENOUS | Status: DC | PRN
  Administered 2023-05-01: 75 ug/kg/min via INTRAVENOUS
  Administered 2023-05-01: 150 mg via INTRAVENOUS
  Administered 2023-05-01: 50 mg via INTRAVENOUS

## 2023-05-01 MED ORDER — HYDROMORPHONE HCL 1 MG/ML IJ SOLN
0.2500 mg | INTRAMUSCULAR | Status: DC | PRN
  Administered 2023-05-01 (×2): 0.5 mg via INTRAVENOUS

## 2023-05-01 MED ORDER — FENTANYL CITRATE (PF) 100 MCG/2ML IJ SOLN
INTRAMUSCULAR | Status: AC
  Filled 2023-05-01: qty 2

## 2023-05-01 MED ORDER — MIDAZOLAM HCL 2 MG/2ML IJ SOLN
INTRAMUSCULAR | Status: DC | PRN
  Administered 2023-05-01: 2 mg via INTRAVENOUS

## 2023-05-01 MED ORDER — LACTATED RINGERS IV SOLN: INTRAVENOUS | Status: DC

## 2023-05-01 MED ORDER — ORAL CARE MOUTH RINSE: 15.0000 mL | Freq: Once | OROMUCOSAL | Status: AC

## 2023-05-01 MED ORDER — PROPOFOL 1000 MG/100ML IV EMUL
INTRAVENOUS | Status: AC
  Filled 2023-05-01: qty 100

## 2023-05-01 MED ORDER — OXYCODONE HCL 5 MG PO TABS
5.0000 mg | ORAL_TABLET | Freq: Once | ORAL | Status: AC | PRN
  Administered 2023-05-01: 5 mg via ORAL

## 2023-05-01 MED ORDER — DEXAMETHASONE SODIUM PHOSPHATE 10 MG/ML IJ SOLN
INTRAMUSCULAR | Status: DC | PRN
  Administered 2023-05-01: 10 mg via INTRAVENOUS

## 2023-05-01 MED ORDER — PHENYLEPHRINE HCL-NACL 20-0.9 MG/250ML-% IV SOLN
INTRAVENOUS | Status: AC
  Filled 2023-05-01: qty 250

## 2023-05-01 MED ORDER — CHLORHEXIDINE GLUCONATE 0.12 % MT SOLN
OROMUCOSAL | Status: AC
  Filled 2023-05-01: qty 15

## 2023-05-01 MED ORDER — ONDANSETRON HCL 4 MG/2ML IJ SOLN
INTRAMUSCULAR | Status: AC
  Filled 2023-05-01: qty 2

## 2023-05-01 MED ORDER — ACETAMINOPHEN 10 MG/ML IV SOLN
INTRAVENOUS | Status: DC | PRN
  Administered 2023-05-01: 1000 mg via INTRAVENOUS

## 2023-05-01 MED ORDER — 0.9 % SODIUM CHLORIDE (POUR BTL) OPTIME
TOPICAL | Status: DC | PRN
  Administered 2023-05-01: 500 mL

## 2023-05-01 MED ORDER — LIDOCAINE HCL (CARDIAC) PF 100 MG/5ML IV SOSY
PREFILLED_SYRINGE | INTRAVENOUS | Status: DC | PRN
  Administered 2023-05-01: 60 mg via INTRAVENOUS

## 2023-05-01 MED ORDER — HYDROMORPHONE HCL 1 MG/ML IJ SOLN
INTRAMUSCULAR | Status: AC
  Filled 2023-05-01: qty 1

## 2023-05-01 MED ORDER — CHLORHEXIDINE GLUCONATE 0.12 % MT SOLN
15.0000 mL | Freq: Once | OROMUCOSAL | Status: AC
  Administered 2023-05-01: 15 mL via OROMUCOSAL

## 2023-05-01 MED ORDER — IBUPROFEN 200 MG PO TABS: 200.0000 mg | ORAL_TABLET | Freq: Three times a day (TID) | ORAL | Status: AC

## 2023-05-01 MED ORDER — CEFAZOLIN SODIUM-DEXTROSE 2-4 GM/100ML-% IV SOLN
2.0000 g | Freq: Once | INTRAVENOUS | Status: AC
Start: 2023-05-01 — End: 2023-05-01
  Administered 2023-05-01: 2 g via INTRAVENOUS

## 2023-05-01 MED ORDER — ONDANSETRON HCL 4 MG/2ML IJ SOLN
INTRAMUSCULAR | Status: DC | PRN
  Administered 2023-05-01: 4 mg via INTRAVENOUS

## 2023-05-01 MED ORDER — SUCCINYLCHOLINE CHLORIDE 200 MG/10ML IV SOSY
PREFILLED_SYRINGE | INTRAVENOUS | Status: AC
  Filled 2023-05-01: qty 10

## 2023-05-01 MED ORDER — SUCCINYLCHOLINE CHLORIDE 200 MG/10ML IV SOSY
PREFILLED_SYRINGE | INTRAVENOUS | Status: DC | PRN
  Administered 2023-05-01: 100 mg via INTRAVENOUS

## 2023-05-01 MED ORDER — OXYCODONE HCL 5 MG/5ML PO SOLN: 5.0000 mg | Freq: Once | ORAL | Status: AC | PRN

## 2023-05-01 SURGICAL SUPPLY — 31 items
BUR NEURO DRILL SOFT 3.0X3.8M (BURR) ×1 IMPLANT
DERMABOND ADVANCED .7 DNX12 (GAUZE/BANDAGES/DRESSINGS) ×1 IMPLANT
DISC MOBI-C CERVICAL 17X17 H5 (Screw) IMPLANT
DRAPE C ARM PK CFD 31 SPINE (DRAPES) ×1 IMPLANT
DRAPE LAPAROTOMY 77X122 PED (DRAPES) ×1 IMPLANT
DRAPE MICROSCOPE SPINE 48X150 (DRAPES) ×1 IMPLANT
ELECT REM PT RETURN 9FT ADLT (ELECTROSURGICAL) ×1
ELECTRODE REM PT RTRN 9FT ADLT (ELECTROSURGICAL) ×1 IMPLANT
GLOVE BIOGEL PI IND STRL 6.5 (GLOVE) ×1 IMPLANT
GLOVE SURG SYN 6.5 ES PF (GLOVE) ×1
GLOVE SURG SYN 6.5 PF PI (GLOVE) ×1 IMPLANT
GLOVE SURG SYN 8.5 E (GLOVE) ×3
GLOVE SURG SYN 8.5 PF PI (GLOVE) ×3 IMPLANT
GOWN SRG LRG LVL 4 IMPRV REINF (GOWNS) ×1 IMPLANT
GOWN SRG XL LVL 3 NONREINFORCE (GOWNS) ×1 IMPLANT
KIT TURNOVER KIT A (KITS) ×1 IMPLANT
MANIFOLD NEPTUNE II (INSTRUMENTS) ×1 IMPLANT
MARKER SKIN DUAL TIP RULER LAB (MISCELLANEOUS) ×2 IMPLANT
NS IRRIG 500ML POUR BTL (IV SOLUTION) ×1 IMPLANT
PACK LAMINECTOMY ARMC (PACKS) ×1 IMPLANT
PAD ARMBOARD 7.5X6 YLW CONV (MISCELLANEOUS) ×2 IMPLANT
PIN CASPAR SPINAL 14MM CERVICA (PIN) IMPLANT
SPONGE KITTNER 5P (MISCELLANEOUS) ×1 IMPLANT
STAPLER SKIN PROX 35W (STAPLE) IMPLANT
SURGIFLO W/THROMBIN 8M KIT (HEMOSTASIS) ×1 IMPLANT
SUT STRATA 3-0 15 PS-2 (SUTURE) ×1 IMPLANT
SUT VICRYL 3-0 CR8 SH (SUTURE) ×1 IMPLANT
SYR 20ML LL LF (SYRINGE) ×1 IMPLANT
TAPE CLOTH 3X10 WHT NS LF (GAUZE/BANDAGES/DRESSINGS) ×2 IMPLANT
TRAP FLUID SMOKE EVACUATOR (MISCELLANEOUS) ×1 IMPLANT
TRAY FOLEY SLVR 16FR LF STAT (SET/KITS/TRAYS/PACK) IMPLANT

## 2023-05-01 NOTE — Transfer of Care (Signed)
Immediate Anesthesia Transfer of Care Note  Patient: Matthew Sans, MD  Procedure(s) Performed: C5-6 ARTHROPLASTY (Spine Cervical)  Patient Location: PACU  Anesthesia Type:General  Level of Consciousness: sedated  Airway & Oxygen Therapy: Patient Spontanous Breathing and Patient connected to face mask oxygen  Post-op Assessment: Report given to RN and Post -op Vital signs reviewed and stable  Post vital signs: stable  Last Vitals:  Vitals Value Taken Time  BP 106/59 05/01/23 0905  Temp 97   Pulse 80 05/01/23 0910  Resp 14 05/01/23 0910  SpO2 99 % 05/01/23 0910  Vitals shown include unfiled device data.  Last Pain:  Vitals:   05/01/23 0622  TempSrc: Temporal  PainSc: 5          Complications: No notable events documented.

## 2023-05-01 NOTE — Op Note (Signed)
Indications: [name] is a 63 y.o. male with M54.12 Cervical radiculopathy, M48.02 Cervical spinal stenosis . He failed conservative management prompting surgical intervention  Findings: cervical stenosis in the foramina  Preoperative Diagnosis: M54.12 Cervical radiculopathy, M48.02 Cervical spinal stenosis  Postoperative Diagnosis: same   EBL: 10 ml IVF: see AR Drains: none Disposition: Extubated and Stable to PACU Complications: none  No foley catheter was placed.   Preoperative Note:   Risks of surgery discussed include: infection, bleeding, stroke, coma, death, paralysis, CSF leak, nerve/spinal cord injury, numbness, tingling, weakness, complex regional pain syndrome, recurrent stenosis and/or disc herniation, vascular injury, development of instability, neck/back pain, need for further surgery, persistent symptoms, development of deformity, and the risks of anesthesia. The patient understood these risks and agreed to proceed.  Operative Note:  Procedure:  1) Cervical Disc Arthroplasty at C5/6 using a LDR Mobi-C device   Procedure: After obtaining informed consent, the patient taken to the operating room, placed in supine position, general anesthesia induced.  The patient had a small shoulder roll placed behind the neck.  The patient received preop antibiotics and IV Decadron.  The patient had a neck incision outlined, was prepped and draped in usual sterile fashion. The incision was injected with local anesthetic.   An incision was opened, dissection taken down medial to the carotid artery and jugular vein, lateral to the trachea and esophagus.  The prevertebral fascia identified and a localizing x-ray demonstrated the correct level.  The longus colli were dissected laterally, and self-retaining retractors placed to open the operative field. The microscope was then brought into the field.  With this complete, distractor pins were placed in the vertebral bodies of C5 and C6.  The distractor was placed, and the annulus at C5/6 was opened using a bovie.  Curettes and pituitary rongeurs used to remove the majority of disk, then the drill was used to remove the posterior osteophyte and begin the foraminotomies. The nerve hook was used to elevate the posterior longitudinal ligament, which was then removed with Kerrison rongeurs. The Kerrison punches were then used to complete foraminotomies bilaterally. The microblunt nerve hook could be passed out the foramen bilaterally.   Meticulous hemostasis was obtained.  A trial spacer was used to size the disc space. Using flouroscopic guidance, a 17 mm width x 17 mm depth x 5 mm height Mobi-C was then inserted in the prepared disc space.  The caspar distractor was removed, and bone wax used for hemostasis. Final AP and lateral radiographs were taken.   With the disc arthroplasty in good position, the wound was irrigated copiously and meticulous hemostasis obtained.  Wound was closed in 2 layers using interrupted inverted 3-0 Vicryl sutures.  The wound was dressed with dermabond, the head of bed at 30 degrees, taken to recovery room in stable condition.  No new postop neurological deficits were identified.  Sponge and pattie counts were correct at the end of the procedure.     I performed the entire procedure with the assistance of Matthew Mcmurray MD as an Designer, television/film set. An assistant was required for this procedure due to the complexity.  The assistant provided assistance in tissue manipulation and suction, and was required for the successful and safe performance of the procedure. I performed the critical portions of the procedure.   Matthew Night MD

## 2023-05-01 NOTE — Discharge Summary (Signed)
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  He tolerated the procedure well and was observed according to our standard protocol for arthroplasty.  After meeting discharge criteria he was discharged in stable condition.   Consults: None  Significant Diagnostic Studies: radiology: X-Ray: C5-6 localization  Treatments: surgery: C5-6 cervical disc arthroplasty  Discharge Exam: Blood pressure (!) 106/59, pulse 80, temperature 98.3 F (36.8 C), resp. rate 14, height 6\' 1"  (1.854 m), weight 94.3 kg, SpO2 100%. General appearance: alert and cooperative CNI MAEW  Neck soft.  Disposition: Discharge disposition: 01-Home or Self Care       Discharge Instructions     Discharge patient   Complete by: As directed    Discharge disposition: 01-Home or Self Care   Discharge patient date: 05/01/2023      Allergies as of 05/01/2023       Reactions   Halcion [triazolam] Other (See Comments)   Moody,angry        Medication List     TAKE these medications    aspirin 81 MG chewable tablet Chew 1 tablet (81 mg total) by mouth daily.   atorvastatin 10 MG tablet Commonly known as: LIPITOR Take 1 tablet (10 mg total) by mouth once daily   gabapentin 100 MG capsule Commonly known as: Neurontin Take 1-3 capsules (100-300 mg total) by mouth at bedtime. Follow written titration schedule.   ibuprofen 200 MG tablet Commonly known as: Advil Take 1 tablet (200 mg total) by mouth every 8 (eight) hours for 14 days.   ketoconazole 2 % shampoo Commonly known as: NIZORAL Apply 1  Application topically 3 (three) times a week. Wash scalp 3 times weekly, let sit 5 minutes and rinse out   levothyroxine 175 MCG tablet Commonly known as: SYNTHROID Take 175 mcg by mouth daily before breakfast.   meloxicam 7.5 MG tablet Commonly known as: MOBIC Take 7.5 mg by mouth daily as needed for pain.   methocarbamol 500 MG tablet Commonly known as: ROBAXIN Take 1 tablet (500 mg total) by mouth every 6 (six) hours as needed for muscle spasms.   minoxidil 2.5 MG tablet Commonly known as: LONITEN Take 2 tablets (5 mg total) by mouth daily. What changed: how much to take   Opzelura 1.5 % Crea Generic drug: Ruxolitinib Phosphate Apply 1 Application topically daily to areas of vitiligo on body   oxyCODONE 5 MG immediate release tablet Commonly known as: Roxicodone Take 1 tablet (5 mg total) by mouth every 4 (four) hours as needed for up to 7 days for moderate pain (pain score 4-6).   Praluent 150 MG/ML Soaj Generic drug: Alirocumab Inject 150 mg into the skin every 14 (fourteen) days.   Praluent 150 MG/ML Soaj Generic drug: Alirocumab Inject 1 mL (150 mg total) into the skin every 14 (fourteen) days.   Praluent 150 MG/ML Soaj Generic drug: Alirocumab Inject 1 mL (150 mg total) into the skin every 14 (fourteen) days.   PreserVision AREDS 2 Caps Take 1 capsule by mouth 2 (two) times daily.   Restasis 0.05 % ophthalmic emulsion Generic drug: cycloSPORINE Place 1 drop into both eyes 2 (two) times daily.  testosterone cypionate 200 MG/ML injection Commonly known as: DEPOTESTOSTERONE CYPIONATE Inject 1 mL (200 mg total) into the muscle every 14 (fourteen) days.   valACYclovir 1000 MG tablet Commonly known as: VALTREX Take 1 tablet (1,000 mg total) by mouth as directed. Take 2 tablets at onset of fever blister symptoms then 2 tablets 12 hours later   Vitamin D 50 MCG (2000 UT) tablet Take 2,000 Units by mouth daily.        Follow-up Information     Susanne Borders, PA Follow up on 05/16/2023.   Specialty: Neurosurgery Contact information: [address] [place] [address] [phone]                 Signed: Venetia Night 05/01/2023, 9:18 AM

## 2023-05-01 NOTE — Anesthesia Procedure Notes (Addendum)
Procedure Name: Intubation Date/Time: 05/01/2023 7:22 AM  Performed by: Benay Spice, RNPre-anesthesia Checklist: Patient identified, Emergency Drugs available, Suction available and Patient being monitored Patient Re-evaluated:Patient Re-evaluated prior to induction Oxygen Delivery Method: Circle system utilized Preoxygenation: Pre-oxygenation with 100% oxygen Induction Type: IV induction Ventilation: Mask ventilation without difficulty Laryngoscope Size: McGrath and 4 Grade View: Grade I Tube type: Oral Tube size: 7.0 mm Number of attempts: 1 Airway Equipment and Method: Stylet Placement Confirmation: ETT inserted through vocal cords under direct vision, positive ETCO2 and breath sounds checked- equal and bilateral Secured at: 22 cm Tube secured with: Tape Dental Injury: Teeth and Oropharynx as per pre-operative assessment

## 2023-05-01 NOTE — Anesthesia Preprocedure Evaluation (Addendum)
Anesthesia Evaluation  Patient identified by MRN, date of birth, ID band Patient awake    Reviewed: Allergy & Precautions, NPO status , Patient's Chart, lab work & pertinent test results  History of Anesthesia Complications Negative for: history of anesthetic complications  Airway Mallampati: II  TM Distance: >3 FB Neck ROM: full    Dental no notable dental hx.    Pulmonary sleep apnea and Continuous Positive Airway Pressure Ventilation    Pulmonary exam normal        Cardiovascular (-) angina + CAD and + Past MI  (-) DOE Normal cardiovascular exam+ dysrhythmias (LBBB)   Stress echocardiogram performed on 03/15/2023 demonstrated excellent exercise capacity with patient achieving 13.4 METS.  Patient with exercise/stress-induced LBBB at peak exercise.  Echocardiographic finding not associated with any chest pain or shortness of breath.  Doppler for diastolic function and assessment of the cardiac valves was not performed.   Neuro/Psych  Neuromuscular disease (neuropathy in b/l hands)  negative psych ROS   GI/Hepatic negative GI ROS, Neg liver ROS,,,  Endo/Other  Hypothyroidism    Renal/GU      Musculoskeletal   Abdominal   Peds  Hematology  (+) Blood dyscrasia, anemia   Anesthesia Other Findings Past Medical History: No date: Aortic atherosclerosis (HCC) 09/07/2008: Atypical nevus     Comment:  Left scapula, lateral. Moderate atypia. No date: Blood in stool 06/16/2020: Coronary artery disease involving native coronary artery  of native heart without angina pectoris     Comment:  a.) anterior STEMI 06/16/2020 --> LHC/PCI: 40% OM2, 99%               pLAD (3.0 x 26 mm Resolute Onyx DES), 50% mLAD, 30% pRCA;              b.) cMRI 11/25/2020: EF 62%, subendocardial infarction (<              25% transmural) involving m-d anteroseptal wall involving              the true apex and distal inf wall No date: DDD (degenerative  disc disease), cervical 2010: Detached retina, left No date: Diastolic dysfunction     Comment:  a.) TTE 09/08/2020: EF >55%, no RWMAs, G1DD, norm RVSF,               triv MR/TR/PR; b.) stress TTE 03/15/2023: LBBB with peak               stress, max workload 13.4 METS, dias function/doppler for              regurg/stenosis not performed 12/20/2009: Dysplastic nevus     Comment:  Spinal lower back. Moderate atypia, edges free 12/20/2009: Dysplastic nevus     Comment:  Left paraspinal lower back. Mild atypia, edges free. 11/24/2018: Dysplastic nevus     Comment:  Right lower back. Mild atypia, deep margin involved. 11/24/2018: Dysplastic nevus     Comment:  Right mid back. Moderate atypia and halo nevus effect.               Limited margins free. No date: GI bleed 12/2022: Herniation of cervical intervertebral disc with radiculopathy     Comment:  a.) secondary to exercise/regimen with personal trainer No date: History of chicken pox No date: Hypercholesterolemia No date: Hyperlipidemia, mixed No date: Hypogonadism in male     Comment:  a.) on exogenous TRT (testosteronew cypionate)  injections No date: Hypothyroidism, postsurgical No date: Iron deficiency anemia No date: Obstructive sleep apnea on CPAP No date: Radiculopathy, lumbar region No date: Rate-related  LEFT bundle branch block 11/25/2020: ST elevation myocardial infarction (STEMI) of anterior  wall (HCC)     Comment:  a.) LHC/PCI: 40% OM2, 99% pLAD (3.0 x 26 mm Resolute               Onyx DES), 50% mLAD, 30% pRCA 2009: Thyroid cancer (HCC)     Comment:  a.) s/p total thyroidectomy No date: Vertigo No date: Vitamin D deficiency  Past Surgical History: No date: CATARACT EXTRACTION W/ INTRAOCULAR LENS  IMPLANT, BILATERAL 04/09/1998: CHOLECYSTECTOMY No date: COLONOSCOPY     Comment:  1998, 2010 05/29/2018: COLONOSCOPY WITH ESOPHAGOGASTRODUODENOSCOPY (EGD) 01/26/2022: COLONOSCOPY WITH  ESOPHAGOGASTRODUODENOSCOPY (EGD) 06/16/2020: CORONARY/GRAFT ACUTE MI REVASCULARIZATION; N/A     Comment:  Procedure: Coronary/Graft Acute MI Revascularization;                Surgeon: Marcina Millard, MD;  Location: [facility]               INVASIVE CV LAB;  Service: Cardiovascular;  Laterality:               N/A; 06/16/2020: LEFT HEART CATH AND CORONARY ANGIOGRAPHY; N/A     Comment:  Procedure: LEFT HEART CATH AND CORONARY ANGIOGRAPHY;                Surgeon: Marcina Millard, MD;  Location: [facility]               INVASIVE CV LAB;  Service: Cardiovascular;  Laterality:               N/A; 06/13/2007: LYMPH NODE DISSECTION No date: NASAL SEPTUM SURGERY No date: RETINAL DETACHMENT SURGERY; Left 06/13/2007: TOTAL THYROIDECTOMY  BMI    Body Mass Index: 27.44 kg/m      Reproductive/Obstetrics negative OB ROS                             Anesthesia Physical Anesthesia Plan  ASA: 3  Anesthesia Plan: General   Post-op Pain Management: Ofirmev IV (intra-op)*, Toradol IV (intra-op)*, Dilaudid IV and Ketamine IV*   Induction: Intravenous  PONV Risk Score and Plan: 3 and Ondansetron, Dexamethasone, Midazolam and Treatment may vary due to age or medical condition  Airway Management Planned: Oral ETT  Additional Equipment:   Intra-op Plan:   Post-operative Plan: Extubation in OR  Informed Consent: I have reviewed the patients History and Physical, chart, labs and discussed the procedure including the risks, benefits and alternatives for the proposed anesthesia with the patient or authorized representative who has indicated his/her understanding and acceptance.     Dental Advisory Given  Plan Discussed with: Anesthesiologist, CRNA and Surgeon  Anesthesia Plan Comments: (Patient consented for risks of anesthesia including but not limited to:  - adverse reactions to medications - damage to eyes, teeth, lips or other oral mucosa - nerve damage due to  positioning  - sore throat or hoarseness - Damage to heart, brain, nerves, lungs, other parts of body or loss of life  Patient voiced understanding and assent.)       Anesthesia Quick Evaluation

## 2023-05-01 NOTE — Anesthesia Postprocedure Evaluation (Signed)
Anesthesia Post Note  Patient: Armida Sans, MD  Procedure(s) Performed: C5-6 ARTHROPLASTY (Spine Cervical)  Patient location during evaluation: PACU Anesthesia Type: Spinal Level of consciousness: awake and alert Pain management: pain level controlled Vital Signs Assessment: post-procedure vital signs reviewed and stable Respiratory status: spontaneous breathing, nonlabored ventilation, respiratory function stable and patient connected to nasal cannula oxygen Cardiovascular status: blood pressure returned to baseline and stable Postop Assessment: no apparent nausea or vomiting Anesthetic complications: no   No notable events documented.   Last Vitals:  Vitals:   05/01/23 0930 05/01/23 0945  BP: 131/86 (!) 155/96  Pulse: 91 89  Resp: 13 20  Temp:    SpO2: 100% 99%    Last Pain:  Vitals:   05/01/23 0945  TempSrc:   PainSc: 6                  Louie Boston

## 2023-05-01 NOTE — Progress Notes (Signed)
Patient ambulated without difficulty. Tolerating PO intake.

## 2023-05-01 NOTE — Discharge Instructions (Addendum)
Your surgeon has performed an operation on your cervical spine (neck) to relieve pressure on the spinal cord and/or nerves. This involved making an incision in the front of your neck and removing one or more of the discs that support your spine. Next, a small piece of bone, a titanium plate, and screws were used to fuse two or more of the vertebrae (bones) together.  The following are instructions to help in your recovery once you have been discharged from the hospital. Even if you feel well, it is important that you follow these activity guidelines. If you do not let your neck heal properly from the surgery, you can increase the chance of return of your symptoms and other complications.  OK to take NSAIDs.   Activity    No bending, lifting, or twisting ("BLT"). Avoid lifting objects heavier than 10 pounds (gallon milk jug).  Where possible, avoid household activities that involve lifting, bending, reaching, pushing, or pulling such as laundry, vacuuming, grocery shopping, and childcare. Try to arrange for help from friends and family for these activities while your back heals.  Increase physical activity slowly as tolerated.  Taking short walks is encouraged, but avoid strenuous exercise. Do not jog, run, bicycle, lift weights, or participate in any other exercises unless specifically allowed by your doctor.  Talk to your doctor before resuming sexual activity.  You should not drive until cleared by your doctor.  Until released by your doctor, you should not return to work or school.  You should rest at home and let your body heal.   You may shower three days after your surgery.  After showering, lightly dab your incision dry. Do not take a tub bath or go swimming until approved by your doctor at your follow-up appointment.  If your doctor ordered a cervical collar (neck brace) for you, you should wear it whenever you are out of bed. You may remove it when lying down or sleeping, but you  should wear it at all other times. Not all neck surgeries require a cervical collar.  If you smoke, we strongly recommend that you quit.  Smoking has been proven to interfere with normal bone healing and will dramatically reduce the success rate of your surgery. Please contact [facility] (800-QUIT-NOW) and use the resources at www.[facility].com for assistance in stopping smoking.  Surgical Incision   If you have a dressing on your incision, you may remove it two days after your surgery. Keep your incision area clean and dry.  If you have staples or stitches on your incision, you should have a follow up scheduled for removal. If you do not have staples or stitches, you will have steri-strips (small pieces of surgical tape) or Dermabond glue. The steri-strips/glue should begin to peel away within about a week (it is fine if the steri-strips fall off before then). If the strips are still in place one week after your surgery, you may gently remove them.  Diet           You may return to your usual diet. However, you may experience discomfort when swallowing in the first month after your surgery. This is normal. You may find that softer foods are more comfortable for you to swallow. Be sure to stay hydrated.  When to Contact us  You may experience pain in your neck and/or pain between your shoulder blades. This is normal and should improve in the next few weeks with the help of pain medication, muscle relaxers, and rest. Some  patients report that a warm compress on the back of the neck or between the shoulder blades helps.  However, should you experience any of the following, contact us immediately: New numbness or weakness Pain that is progressively getting worse, and is not relieved by your pain medication, muscle relaxers, rest, and warm compresses Bleeding, redness, swelling, pain, or drainage from surgical incision Chills or flu-like symptoms Fever greater than 101.0 F (38.3 C) Inability to  eat, drink fluids, or take medications Problems with bowel or bladder functions Difficulty breathing or shortness of breath Warmth, tenderness, or swelling in your calf Contact Information How to contact us:  If you have any questions/concerns before or after surgery, you can reach [place] at [phone], or you can send a mychart message. We can be reached by phone or mychart 8am-4pm, Monday-Friday.  *Please note: Calls after 4pm are forwarded to a third party answering service. Mychart messages are not routinely monitored during evenings, weekends, and holidays. Please call our office to contact the answering service for urgent concerns during non-business hours.

## 2023-05-01 NOTE — Interval H&P Note (Signed)
History and Physical Interval Note:  05/01/2023 6:54 AM  Matthew Sans, MD  has presented today for surgery, with the diagnosis of M54.12 Cervical radiculopathy M48.02 Cervical spinal stenosis.  The various methods of treatment have been discussed with the patient and family. After consideration of risks, benefits and other options for treatment, the patient has consented to  Procedure(s): C5-6 ARTHROPLASTY (N/A) as a surgical intervention.  The patient's history has been reviewed, patient examined, no change in status, stable for surgery.  I have reviewed the patient's chart and labs.  Questions were answered to the patient's satisfaction.    Heart sounds normal no MRG. Chest Clear to Auscultation Bilaterally.   Terria Deschepper

## 2023-05-02 ENCOUNTER — Encounter: Payer: Self-pay | Admitting: Neurosurgery

## 2023-05-06 ENCOUNTER — Ambulatory Visit: Payer: 59 | Admitting: Student in an Organized Health Care Education/Training Program

## 2023-05-16 ENCOUNTER — Ambulatory Visit: Payer: 59 | Admitting: Neurosurgery

## 2023-05-16 ENCOUNTER — Encounter: Payer: Self-pay | Admitting: Neurosurgery

## 2023-05-16 VITALS — BP 158/92 | Temp 98.5°F | Ht 73.0 in | Wt 208.0 lb

## 2023-05-16 DIAGNOSIS — Z09 Encounter for follow-up examination after completed treatment for conditions other than malignant neoplasm: Secondary | ICD-10-CM

## 2023-05-16 DIAGNOSIS — M5412 Radiculopathy, cervical region: Secondary | ICD-10-CM

## 2023-05-16 NOTE — Progress Notes (Signed)
   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 Overall, he is doing fair since surgery. He feels his overall neck pain is better despite some ongoing soreness but continues to have numbness and pain in his hands as well as trembling in his thumbs.  He is still having some trouble with his grip.  He is not currently taking anything for pain and is overall pleased with his postoperative recovery thus far.  Pt requesting that it be documented that his symptoms started after a workout injury in September of 2024 prompting the severity of his symptoms and thus need for surgical intervention. He neglected to mention this at his previous visits.   PHYSICAL EXAMINATION:  NEUROLOGICAL:  General: In no acute distress.   Awake, alert, oriented to person, place, and time.  Pupils equal round and reactive to light.   Strength: Side Biceps Triceps Deltoid Interossei Grip Wrist Ext. Wrist Flex.  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  I recommended that he continue with our activity restrictions and if he could do so that he could return to work.  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  We discussed activity escalation and I have advised the patient to lift up to 10 pounds until 6 weeks after surgery, then increase up to 25 pounds until 12 weeks after surgery.  After 12 weeks post-op, the patient advised to increase activity  as tolerated. he will return to clinic in approximately 4 weeks to see Dr. Clois  Advised to contact the office if any questions or concerns arise.   Edsel Goods PA-C Dept of Neurosurgery

## 2023-05-17 ENCOUNTER — Inpatient Hospital Stay: Payer: 59 | Attending: Internal Medicine

## 2023-05-17 VITALS — BP 126/72 | HR 78 | Resp 16

## 2023-05-17 DIAGNOSIS — E611 Iron deficiency: Secondary | ICD-10-CM

## 2023-05-17 DIAGNOSIS — D509 Iron deficiency anemia, unspecified: Secondary | ICD-10-CM | POA: Insufficient documentation

## 2023-05-17 MED ORDER — IRON SUCROSE 20 MG/ML IV SOLN
200.0000 mg | Freq: Once | INTRAVENOUS | Status: AC
  Administered 2023-05-17: 200 mg via INTRAVENOUS
  Filled 2023-05-17: qty 10

## 2023-05-21 ENCOUNTER — Encounter: Payer: Self-pay | Admitting: Neurosurgery

## 2023-05-24 ENCOUNTER — Other Ambulatory Visit: Payer: Self-pay

## 2023-05-27 ENCOUNTER — Other Ambulatory Visit: Payer: Self-pay

## 2023-05-30 ENCOUNTER — Encounter: Payer: Self-pay | Admitting: Internal Medicine

## 2023-06-07 ENCOUNTER — Other Ambulatory Visit: Payer: Self-pay | Admitting: *Deleted

## 2023-06-07 DIAGNOSIS — E611 Iron deficiency: Secondary | ICD-10-CM

## 2023-06-10 ENCOUNTER — Ambulatory Visit
Admission: RE | Admit: 2023-06-10 | Discharge: 2023-06-10 | Disposition: A | Discharge status: Home / Self Care | Source: Ambulatory Visit | Attending: Neurosurgery | Admitting: Neurosurgery

## 2023-06-10 ENCOUNTER — Ambulatory Visit
Admission: RE | Admit: 2023-06-10 | Discharge: 2023-06-10 | Disposition: A | Discharge status: Home / Self Care | Attending: Neurosurgery | Admitting: Neurosurgery

## 2023-06-10 ENCOUNTER — Inpatient Hospital Stay: Payer: 59 | Attending: Internal Medicine

## 2023-06-10 ENCOUNTER — Other Ambulatory Visit: Payer: Self-pay | Admitting: Family Medicine

## 2023-06-10 ENCOUNTER — Inpatient Hospital Stay: Payer: 59

## 2023-06-10 DIAGNOSIS — D509 Iron deficiency anemia, unspecified: Secondary | ICD-10-CM | POA: Diagnosis not present

## 2023-06-10 DIAGNOSIS — M5412 Radiculopathy, cervical region: Secondary | ICD-10-CM

## 2023-06-10 DIAGNOSIS — E611 Iron deficiency: Secondary | ICD-10-CM

## 2023-06-10 DIAGNOSIS — Z0189 Encounter for other specified special examinations: Secondary | ICD-10-CM | POA: Diagnosis not present

## 2023-06-10 LAB — CBC WITH DIFFERENTIAL (CANCER CENTER ONLY)
Abs Immature Granulocytes: 0.01 10*3/uL (ref 0.00–0.07)
Basophils Absolute: 0.1 10*3/uL (ref 0.0–0.1)
Basophils Relative: 1 %
Eosinophils Absolute: 0.2 10*3/uL (ref 0.0–0.5)
Eosinophils Relative: 4 %
HCT: 45 % (ref 39.0–52.0)
Hemoglobin: 14.9 g/dL (ref 13.0–17.0)
Immature Granulocytes: 0 %
Lymphocytes Relative: 33 %
Lymphs Abs: 1.7 10*3/uL (ref 0.7–4.0)
MCH: 26.9 pg (ref 26.0–34.0)
MCHC: 33.1 g/dL (ref 30.0–36.0)
MCV: 81.2 fL (ref 80.0–100.0)
Monocytes Absolute: 0.5 10*3/uL (ref 0.1–1.0)
Monocytes Relative: 10 %
Neutro Abs: 2.7 10*3/uL (ref 1.7–7.7)
Neutrophils Relative %: 52 %
Platelet Count: 208 10*3/uL (ref 150–400)
RBC: 5.54 MIL/uL (ref 4.22–5.81)
RDW: 15.4 % (ref 11.5–15.5)
WBC Count: 5.1 10*3/uL (ref 4.0–10.5)
nRBC: 0 % (ref 0.0–0.2)

## 2023-06-10 LAB — BASIC METABOLIC PANEL - CANCER CENTER ONLY
Anion gap: 9 (ref 5–15)
BUN: 20 mg/dL (ref 8–23)
CO2: 27 mmol/L (ref 22–32)
Calcium: 8.7 mg/dL — ABNORMAL LOW (ref 8.9–10.3)
Chloride: 102 mmol/L (ref 98–111)
Creatinine: 1.2 mg/dL (ref 0.61–1.24)
GFR, Estimated: >60 mL/min (ref >60)
Glucose, Bld: 107 mg/dL — ABNORMAL HIGH (ref 70–99)
Potassium: 4 mmol/L (ref 3.5–5.1)
Sodium: 138 mmol/L (ref 135–145)

## 2023-06-10 LAB — IRON AND TIBC
Iron: 70 ug/dL (ref 45–182)
Saturation Ratios: 18 % (ref 17.9–39.5)
TIBC: 396 ug/dL (ref 250–450)
UIBC: 326 ug/dL

## 2023-06-10 LAB — FERRITIN: Ferritin: 26 ng/mL (ref 24–336)

## 2023-06-13 ENCOUNTER — Ambulatory Visit: Payer: 59 | Admitting: Neurosurgery

## 2023-06-13 ENCOUNTER — Encounter: Payer: Self-pay | Admitting: Neurosurgery

## 2023-06-13 VITALS — BP 158/96 | Temp 98.1°F | Ht 73.0 in | Wt 200.0 lb

## 2023-06-13 DIAGNOSIS — M5412 Radiculopathy, cervical region: Secondary | ICD-10-CM

## 2023-06-13 NOTE — Progress Notes (Signed)
   REFERRING PHYSICIAN:  Dale [place], Md 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   He is doing very well.  His neck and radicular pain is much improved.  He still having some numbness in his hands in his first and second finger.  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  He has been able to return to practice, but is not performing his full scope of duties.  His symptoms initially began after a workout injury in September 2024.   PHYSICAL EXAMINATION:  NEUROLOGICAL:  General: In no acute distress.   Awake, alert, oriented to person, place, and time.  Pupils equal round and reactive to light.   Strength: Side Biceps Triceps Deltoid Interossei Grip Wrist Ext. Wrist Flex.  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  He is still having some numbness and his first and second fingers.  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  As such, he has limited his scope for now.    I have given him exercises for his neck.  We discussed his activity limitations.  He is not a 25 pound lifting limit.  I have released him to start exercising.   Venetia Night MD Dept of Neurosurgery

## 2023-06-14 ENCOUNTER — Inpatient Hospital Stay: Payer: 59

## 2023-06-14 ENCOUNTER — Inpatient Hospital Stay: Payer: 59 | Admitting: Internal Medicine

## 2023-06-14 ENCOUNTER — Encounter: Payer: Self-pay | Admitting: Internal Medicine

## 2023-06-14 VITALS — BP 141/85 | HR 85 | Temp 98.0°F | Resp 16 | Wt 203.0 lb

## 2023-06-14 VITALS — BP 137/86 | HR 82

## 2023-06-14 DIAGNOSIS — E611 Iron deficiency: Secondary | ICD-10-CM | POA: Diagnosis not present

## 2023-06-14 DIAGNOSIS — D509 Iron deficiency anemia, unspecified: Secondary | ICD-10-CM | POA: Diagnosis not present

## 2023-06-14 MED ORDER — SODIUM CHLORIDE 0.9% FLUSH
10.0000 mL | Freq: Once | INTRAVENOUS | Status: AC | PRN
Start: 2023-06-14 — End: 2023-06-14
  Administered 2023-06-14: 10 mL
  Filled 2023-06-14: qty 10

## 2023-06-14 MED ORDER — IRON SUCROSE 20 MG/ML IV SOLN
200.0000 mg | Freq: Once | INTRAVENOUS | Status: AC
  Administered 2023-06-14: 200 mg via INTRAVENOUS
  Filled 2023-06-14: qty 10

## 2023-06-14 NOTE — Progress Notes (Signed)
 Patient would like for Dr. B to look at a MRI that he had done back in December of last year, he would like to discuss the MRI results.

## 2023-06-14 NOTE — Patient Instructions (Signed)
#  Recommend gentle iron [iron biglycinate; 28 mg ] 1 pill a day.  This pill is unlikely to cause stomach upset or cause constipation.   # Recommend barimelt+ iron [over-the-counter medication/can buy online].  2 a day- under the tongue. These pills need to dissolve under the tongue/and should be absorbed into your bloodstream.

## 2023-06-14 NOTE — Progress Notes (Signed)
 [facility] CONSULT NOTE  Patient Care Team: Dale La Vale, MD as PCP - General (Internal Medicine) Dale Lincoln, MD (Internal Medicine) Earna Coder, MD as Consulting Physician (Oncology) Corky Sing Meryl Dare, MD as Consulting Physician (Cardiology)  CHIEF COMPLAINTS/PURPOSE OF CONSULTATION: ANEMIA   HEMATOLOGY HISTORY:  # ANEMIA capsule- AVMs- actively bleeding [sep 2022]; EGD-; colonoscopy-FEB, 2020 < 2007- EGD-stretching of esophagus. prior history of GI bleed/AVMs more than 15 years ago. April/MAY 2023- GI balloon double enteroscopy/ May 2023 -capsule study-.study was unremarkable.   # CAD [on asprin 81 mg/day s/p stenting; off brillinta]  HISTORY OF PRESENTING ILLNESS: Ambulating independently.  Alone.  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 iron] is here for follow-up.  Patient had imaging of his neck with MRI recently for his neck pain-preop workup for neck fusion surgery.  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  Patient currently on iron infusion once a month.  Patient is just on aspirin.  Patient notes his energy level is improved but not back to normal.  No further episodes of GI bleed.  Review of Systems  Constitutional:  Positive for malaise/fatigue. Negative for chills, diaphoresis, fever and weight loss.  HENT:  Negative for nosebleeds and sore throat.   Eyes:  Negative for double vision.  Respiratory:  Negative for cough, hemoptysis, sputum production, shortness of breath and wheezing.   Cardiovascular:  Negative for chest pain, palpitations, orthopnea and leg swelling.  Gastrointestinal:  Negative for abdominal pain, blood in stool, constipation, diarrhea, heartburn, melena, nausea and vomiting.  Genitourinary:  Negative for dysuria, frequency and urgency.  Musculoskeletal:  Negative for back pain and joint pain.  Skin: Negative.   Negative for itching and rash.  Neurological:  Negative for dizziness, tingling, focal weakness, weakness and headaches.  Endo/Heme/Allergies:  Does not bruise/bleed easily.  Psychiatric/Behavioral:  Negative for depression. The patient is not nervous/anxious and does not have insomnia.     MEDICAL HISTORY:  Past Medical History:  Diagnosis Date   Aortic atherosclerosis (HCC)    Atypical nevus 09/07/2008   Left scapula, lateral. Moderate atypia.   Blood in stool    Coronary artery disease involving native coronary artery of native heart without angina pectoris 06/16/2020   a.) anterior STEMI 06/16/2020 --> LHC/PCI: 40% OM2, 99% pLAD (3.0 x 26 mm Resolute Onyx DES), 50% mLAD, 30% pRCA; b.) cMRI 11/25/2020: EF 62%, subendocardial infarction (< 25% transmural) involving m-d anteroseptal wall involving the true apex and distal inf wall   DDD (degenerative disc disease), cervical    Detached retina, left 2010   Diastolic dysfunction    a.) TTE 09/08/2020: EF >55%, no RWMAs, G1DD, norm RVSF, triv MR/TR/PR; b.) stress TTE 03/15/2023: LBBB with peak stress, max workload 13.4 METS, dias function/doppler for regurg/stenosis not performed   Dysplastic nevus 12/20/2009   Spinal lower back. Moderate atypia, edges free   Dysplastic nevus 12/20/2009   Left paraspinal lower back. Mild atypia, edges free.   Dysplastic nevus 11/24/2018   Right lower back. Mild atypia, deep margin involved.   Dysplastic nevus 11/24/2018   Right mid back. Moderate atypia and halo nevus effect. Limited margins free.   GI bleed    Herniation of cervical intervertebral disc with radiculopathy 12/2022   a.) secondary to exercise/regimen with personal trainer   History of chicken pox    Hypercholesterolemia    Hyperlipidemia, mixed    Hypogonadism in male  a.) on exogenous TRT (testosteronew cypionate) injections   Hypothyroidism, postsurgical    Iron deficiency anemia    Obstructive sleep apnea on CPAP     Radiculopathy, lumbar region    Rate-related  LEFT bundle branch block    ST elevation myocardial infarction (STEMI) of anterior wall (HCC) 11/25/2020   a.) LHC/PCI: 40% OM2, 99% pLAD (3.0 x 26 mm Resolute Onyx DES), 50% mLAD, 30% pRCA   Thyroid cancer (HCC) 2009   a.) s/p total thyroidectomy   Vertigo    Vitamin D deficiency     SURGICAL HISTORY: Past Surgical History:  Procedure Laterality Date   CATARACT EXTRACTION W/ INTRAOCULAR LENS  IMPLANT, BILATERAL     CERVICAL DISC ARTHROPLASTY N/A 05/01/2023   Procedure: C5-6 ARTHROPLASTY;  Surgeon: Venetia Night, MD;  Location: [facility] ORS;  Service: Neurosurgery;  Laterality: N/A;   CHOLECYSTECTOMY  04/09/1998   COLONOSCOPY     1998, 2010   COLONOSCOPY WITH ESOPHAGOGASTRODUODENOSCOPY (EGD)  05/29/2018   COLONOSCOPY WITH ESOPHAGOGASTRODUODENOSCOPY (EGD)  01/26/2022   CORONARY/GRAFT ACUTE MI REVASCULARIZATION N/A 06/16/2020   Procedure: Coronary/Graft Acute MI Revascularization;  Surgeon: Marcina Millard, MD;  Location: [facility];  Service: Cardiovascular;  Laterality: N/A;   LEFT HEART CATH AND CORONARY ANGIOGRAPHY N/A 06/16/2020   Procedure: LEFT HEART CATH AND CORONARY ANGIOGRAPHY;  Surgeon: Marcina Millard, MD;  Location: [facility];  Service: Cardiovascular;  Laterality: N/A;   LYMPH NODE DISSECTION  06/13/2007   NASAL SEPTUM SURGERY     RETINAL DETACHMENT SURGERY Left    TOTAL THYROIDECTOMY  06/13/2007    SOCIAL HISTORY: Social History   Socioeconomic History   Marital status: Married    Spouse name: Para March   Number of children: 3   Years of education: Not on file   Highest education level: Not on file  Occupational History   Not on file  Tobacco Use   Smoking status: Never   Smokeless tobacco: Never  Vaping Use   Vaping status: Never Used  Substance and Sexual Activity   Alcohol use: Yes    Comment: weekly   Drug use: No   Sexual activity: Not on file  Other Topics Concern   Not  on file  Social History Narrative   ** Merged History Encounter **       Social Drivers of Corporate investment banker Strain: Not on file  Food Insecurity: Not on file  Transportation Needs: Not on file  Physical Activity: Not on file  Stress: Not on file  Social Connections: Not on file  Intimate Partner Violence: Not on file    FAMILY HISTORY: Family History  Problem Relation Age of Onset   Hyperlipidemia Father    Heart disease Father     ALLERGIES:  is allergic to halcion [triazolam].  MEDICATIONS:  Current Outpatient Medications  Medication Sig Dispense Refill   Alirocumab (PRALUENT) 150 MG/ML SOAJ Inject 1 mL (150 mg total) into the skin every 14 (fourteen) days. 6 mL 4   aspirin 81 MG chewable tablet Chew 1 tablet (81 mg total) by mouth daily. 30 tablet 2   atorvastatin (LIPITOR) 10 MG tablet Take 1 tablet (10 mg total) by mouth once daily (Patient not taking: Reported on 04/29/2023) 30 tablet 11   Cholecalciferol (VITAMIN D) 50 MCG (2000 UT) tablet Take 2,000 Units by mouth daily.     cycloSPORINE (RESTASIS) 0.05 % ophthalmic emulsion Place 1 drop into both eyes 2 (two) times daily. 180 each 3  ketoconazole (NIZORAL) 2 % shampoo Apply 1 Application topically 3 (three) times a week. Wash scalp 3 times weekly, let sit 5 minutes and rinse out 360 mL 4   levothyroxine (SYNTHROID) 175 MCG tablet Take 175 mcg by mouth daily before breakfast.     minoxidil (LONITEN) 2.5 MG tablet Take 2 tablets (5 mg total) by mouth daily. (Patient taking differently: Take 2.5 mg by mouth daily.) 180 tablet 4   Multiple Vitamins-Minerals (PRESERVISION AREDS 2) CAPS Take 1 capsule by mouth 2 (two) times daily.     Ruxolitinib Phosphate (OPZELURA) 1.5 % CREA Apply 1 Application topically daily to areas of vitiligo on body 180 g 4   testosterone cypionate (DEPOTESTOSTERONE CYPIONATE) 200 MG/ML injection Inject 1 mL (200 mg total) into the muscle every 14 (fourteen) days. 6 mL 1   valACYclovir  (VALTREX) 1000 MG tablet Take 1 tablet (1,000 mg total) by mouth as directed. Take 2 tablets at onset of fever blister symptoms then 2 tablets 12 hours later 90 tablet 4   No current facility-administered medications for this visit.   Facility-Administered Medications Ordered in Other Visits  Medication Dose Route Frequency Provider Last Rate Last Admin   iron sucrose (VENOFER) injection 200 mg  200 mg Intravenous Once Louretta Shorten R, MD       sodium chloride flush (NS) 0.9 % injection 10 mL  10 mL Intracatheter Once PRN Earna Coder, MD          PHYSICAL EXAMINATION:   Vitals:   06/14/23 1510  BP: (!) 141/85  Pulse: 85  Resp: 16  Temp: 98 F (36.7 C)  SpO2: 99%    Filed Weights   06/14/23 1510  Weight: 203 lb (92.1 kg)   Physical Exam Vitals and nursing note reviewed.  HENT:     Head: Normocephalic and atraumatic.     Mouth/Throat:     Pharynx: Oropharynx is clear.  Eyes:     Extraocular Movements: Extraocular movements intact.     Pupils: Pupils are equal, round, and reactive to light.  Cardiovascular:     Rate and Rhythm: Normal rate and regular rhythm.  Abdominal:     Palpations: Abdomen is soft.  Musculoskeletal:        General: Normal range of motion.     Cervical back: Normal range of motion.  Skin:    General: Skin is warm.  Neurological:     General: No focal deficit present.     Mental Status: He is alert and oriented to person, place, and time.  Psychiatric:        Behavior: Behavior normal.        Judgment: Judgment normal.    LABORATORY DATA:  I have reviewed the data as listed Lab Results  Component Value Date   WBC 5.1 06/10/2023   HGB 14.9 06/10/2023   HCT 45.0 06/10/2023   MCV 81.2 06/10/2023   PLT 208 06/10/2023   Recent Labs    07/20/22 0812 12/14/22 1314 01/25/23 0811 03/25/23 0830 04/29/23 0735 06/10/23 0815  NA 142 140 141 141 140 138  K 3.9 4.0 4.0 4.1 4.0 4.0  CL 105 105 104 105 103 102  CO2 28 27 30  27 29 27   GLUCOSE 99 135* 100* 104* 94 107*  BUN 18 18 19 18 18 20   CREATININE 1.09 1.04 1.17 1.19 1.27 1.20  CALCIUM 8.9 8.8* 8.8 8.5* 8.9 8.7*  GFRNONAA  --  >60  --  >60  --  >  60  PROT 6.6  --  6.7  --   --   --   ALBUMIN 4.4  --  4.3  --   --   --   AST 14  --  16  --   --   --   ALT 15  --  18  --   --   --   ALKPHOS 74  --  68  --   --   --   BILITOT 0.6  --  0.6  --   --   --   BILIDIR 0.1  --  0.1  --   --   --      No results found.  Iron deficiency # Anemia- iron deficiency-likely secondary to occult GI bleed [see below].  S/p Venofer.  # Proceed with Venofer-daily given 2 months.  Labs ordered.  Discussed regarding gentle iron and  barimelt as maintenance.  #Etiology: Iron deficiency. unclear likely occult/ secondary to chronic GI bleeding/AVMs-s/p capsule study/ double balloon enteroscopy/capsule [April/MAY 2023]-do not suspect any bone marrow/PNH.  Status post referral to [facility]- EGD/[facility]- OCT-2023- WNL- stable.   # CAD - [off Brillinta- dec 2022; on asprin 81 mg/day]- stable.    # DISPOSITION: Fridays #  Venofer today # in 2 months- Monday- Labs- cbc/ iron studies;ferritin-  Friday possible venofer # in 4  months- Monday- Labs- cbc/ iron studies;ferritin-  Friday possible venofer # follow up in 6 months- MD; labs- cbc/bmp; iron studies/ferritin-possible venofer- Dr.B      All questions were answered. The patient knows to call the clinic with any problems, questions or concerns.   Earna Coder, MD 06/14/2023 3:51 PM

## 2023-06-14 NOTE — Patient Instructions (Signed)

## 2023-06-14 NOTE — Assessment & Plan Note (Addendum)
#   Anemia- iron deficiency-likely secondary to occult GI bleed [see below].  S/p Venofer.  # Proceed with Venofer-daily given 2 months.  Labs ordered.  Discussed regarding gentle iron and  barimelt as maintenance.  #Etiology: Iron deficiency. unclear likely occult/ secondary to chronic GI bleeding/AVMs-s/p capsule study/ double balloon enteroscopy/capsule [April/MAY 2023]-do not suspect any bone marrow/PNH.  Status post referral to [facility]- EGD/[facility]- OCT-2023- WNL- stable.   # CAD - [off Brillinta- dec 2022; on asprin 81 mg/day]- stable.    # DISPOSITION: Fridays #  Venofer today # in 2 months- Monday- Labs- cbc/ iron studies;ferritin-  Friday possible venofer # in 4  months- Monday- Labs- cbc/ iron studies;ferritin-  Friday possible venofer # follow up in 6 months- MD; labs- cbc/bmp; iron studies/ferritin-possible venofer- Dr.B

## 2023-06-17 ENCOUNTER — Encounter: Payer: Self-pay | Admitting: Urology

## 2023-06-17 ENCOUNTER — Encounter: Payer: Self-pay | Admitting: Internal Medicine

## 2023-06-20 ENCOUNTER — Encounter: Payer: Self-pay | Admitting: Internal Medicine

## 2023-06-21 ENCOUNTER — Other Ambulatory Visit: Payer: Self-pay

## 2023-06-21 ENCOUNTER — Other Ambulatory Visit: Payer: Self-pay | Admitting: *Deleted

## 2023-06-21 DIAGNOSIS — R7989 Other specified abnormal findings of blood chemistry: Secondary | ICD-10-CM

## 2023-06-21 MED ORDER — TOBRAMYCIN 0.3 % OP SOLN
1.0000 [drp] | Freq: Four times a day (QID) | OPHTHALMIC | 0 refills | Status: AC
  Filled 2023-06-21: qty 5, 25d supply, fill #0

## 2023-06-23 ENCOUNTER — Encounter: Payer: Self-pay | Admitting: Urology

## 2023-06-24 ENCOUNTER — Other Ambulatory Visit: Payer: Self-pay | Admitting: Urology

## 2023-06-24 ENCOUNTER — Encounter: Payer: Self-pay | Admitting: Internal Medicine

## 2023-06-24 ENCOUNTER — Other Ambulatory Visit: Payer: Self-pay

## 2023-06-24 MED ORDER — TESTOSTERONE CYPIONATE 200 MG/ML IM SOLN
200.0000 mg | INTRAMUSCULAR | 0 refills | Status: DC
Start: 2023-06-24 — End: 2023-08-19
  Filled 2023-06-24 – 2023-07-08 (×3): qty 2, 28d supply, fill #0

## 2023-07-02 ENCOUNTER — Encounter: Payer: Self-pay | Admitting: *Deleted

## 2023-07-03 ENCOUNTER — Encounter: Payer: Self-pay | Admitting: Student in an Organized Health Care Education/Training Program

## 2023-07-03 ENCOUNTER — Encounter: Payer: Self-pay | Admitting: Internal Medicine

## 2023-07-03 ENCOUNTER — Encounter: Payer: Self-pay | Admitting: Urology

## 2023-07-05 ENCOUNTER — Other Ambulatory Visit: Payer: Self-pay

## 2023-07-08 ENCOUNTER — Other Ambulatory Visit: Payer: Self-pay | Admitting: Urology

## 2023-07-08 ENCOUNTER — Other Ambulatory Visit: Payer: Self-pay

## 2023-07-24 ENCOUNTER — Other Ambulatory Visit: Payer: Self-pay

## 2023-07-24 DIAGNOSIS — M5412 Radiculopathy, cervical region: Secondary | ICD-10-CM

## 2023-07-25 ENCOUNTER — Ambulatory Visit
Admission: RE | Admit: 2023-07-25 | Discharge: 2023-07-25 | Disposition: A | Discharge status: Home / Self Care | Source: Ambulatory Visit | Attending: Neurosurgery | Admitting: Neurosurgery

## 2023-07-25 ENCOUNTER — Ambulatory Visit
Admission: RE | Admit: 2023-07-25 | Discharge: 2023-07-25 | Disposition: A | Discharge status: Home / Self Care | Attending: Neurosurgery | Admitting: Neurosurgery

## 2023-07-25 ENCOUNTER — Encounter: Payer: Self-pay | Admitting: Neurosurgery

## 2023-07-25 ENCOUNTER — Ambulatory Visit (INDEPENDENT_AMBULATORY_CARE_PROVIDER_SITE_OTHER): Payer: 59 | Admitting: Neurosurgery

## 2023-07-25 VITALS — BP 166/92 | Ht 73.0 in | Wt 203.0 lb

## 2023-07-25 DIAGNOSIS — M5412 Radiculopathy, cervical region: Secondary | ICD-10-CM

## 2023-07-25 DIAGNOSIS — Z981 Arthrodesis status: Secondary | ICD-10-CM | POA: Diagnosis not present

## 2023-07-25 DIAGNOSIS — Z09 Encounter for follow-up examination after completed treatment for conditions other than malignant neoplasm: Secondary | ICD-10-CM

## 2023-07-25 NOTE — Progress Notes (Signed)
   REFERRING PHYSICIAN:  [name], Md 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 Matthew Kelley is doing fairly well despite continued tremor and neck discomfort. Matthew Kelley has made modifications to his work environment that are working well for him.   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   Matthew Kelley is doing very well.  His neck and radicular pain is much improved.  Matthew Kelley still having some numbness in his hands in his first and second finger.  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  Matthew Kelley has been able to return to practice, but is not performing his full scope of duties.  His symptoms initially began after a workout injury in September 2024.   PHYSICAL EXAMINATION:  NEUROLOGICAL:  General: In no acute distress.   Awake, alert, oriented to person, place, and time.  Pupils equal round and reactive to light.   Strength: Side Biceps Triceps Deltoid Interossei Grip Wrist Ext. Wrist Flex.  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  Matthew Kelley is still having some numbness and his first and second fingers.  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  As such, Matthew Kelley has limited his scope for now and plans to continue to do so. Matthew Kelley is released to resume activities as tolerated We will see him back in 6 months with cervical xrays prior or sooner should Matthew Kelley have questions or concern.  Anastacio Karvonen PA-C Dept of  Neurosurgery

## 2023-08-02 ENCOUNTER — Ambulatory Visit: Payer: 59 | Admitting: Internal Medicine

## 2023-08-02 ENCOUNTER — Other Ambulatory Visit: Payer: Self-pay

## 2023-08-02 VITALS — BP 124/68 | HR 86 | Temp 98.0°F | Resp 16 | Ht 73.0 in | Wt 203.0 lb

## 2023-08-02 DIAGNOSIS — G4733 Obstructive sleep apnea (adult) (pediatric): Secondary | ICD-10-CM | POA: Diagnosis not present

## 2023-08-02 DIAGNOSIS — D509 Iron deficiency anemia, unspecified: Secondary | ICD-10-CM | POA: Diagnosis not present

## 2023-08-02 DIAGNOSIS — I25119 Atherosclerotic heart disease of native coronary artery with unspecified angina pectoris: Secondary | ICD-10-CM

## 2023-08-02 DIAGNOSIS — Z125 Encounter for screening for malignant neoplasm of prostate: Secondary | ICD-10-CM

## 2023-08-02 DIAGNOSIS — R7989 Other specified abnormal findings of blood chemistry: Secondary | ICD-10-CM | POA: Diagnosis not present

## 2023-08-02 DIAGNOSIS — Z85850 Personal history of malignant neoplasm of thyroid: Secondary | ICD-10-CM

## 2023-08-02 DIAGNOSIS — E7800 Pure hypercholesterolemia, unspecified: Secondary | ICD-10-CM | POA: Diagnosis not present

## 2023-08-02 NOTE — Progress Notes (Signed)
 Subjective:    Patient ID: Matthew Collard, MD, male    DOB: 03-02-1961, 63 y.o.   MRN: [id number]  Patient here for  Chief Complaint  Patient presents with   Medical Management of Chronic Issues    HPI Here for a scheduled follow up - f/u regarding CAD, hypercholesterolemia and recent neck surgery. Is s/p cervical arthroplasty. Doing well from the surgery. Still having some numbness in his hands (first and second finger). Some tremor. Overall doing better. Had f/u with Dr Valentine Gasmen 06/14/23 - IV venofer .  Hematology following cbc and iron  studies. Stays active. No chest pain or sob reported. Breathing stable. Feels better after starting testosterone . Taking praluent . Off statin medication. Due to have labs next week through hematology.    Past Medical History:  Diagnosis Date   Aortic atherosclerosis (HCC)    Atypical nevus 09/07/2008   Left scapula, lateral. Moderate atypia.   Blood in stool    Coronary artery disease involving native coronary artery of native heart without angina pectoris 06/16/2020   a.) anterior STEMI 06/16/2020 --> LHC/PCI: 40% OM2, 99% pLAD (3.0 x 26 mm Resolute Onyx DES), 50% mLAD, 30% pRCA; b.) cMRI 11/25/2020: EF 62%, subendocardial infarction (< 25% transmural) involving m-d anteroseptal wall involving the true apex and distal inf wall   DDD (degenerative disc disease), cervical    Detached retina, left 2010   Diastolic dysfunction    a.) TTE 09/08/2020: EF >55%, no RWMAs, G1DD, norm RVSF, triv MR/TR/PR; b.) stress TTE 03/15/2023: LBBB with peak stress, max workload 13.4 METS, dias function/doppler for regurg/stenosis not performed   Dysplastic nevus 12/20/2009   Spinal lower back. Moderate atypia, edges free   Dysplastic nevus 12/20/2009   Left paraspinal lower back. Mild atypia, edges free.   Dysplastic nevus 11/24/2018   Right lower back. Mild atypia, deep margin involved.   Dysplastic nevus 11/24/2018   Right mid back. Moderate atypia and halo nevus  effect. Limited margins free.   GI bleed    Herniation of cervical intervertebral disc with radiculopathy 12/2022   a.) secondary to exercise/regimen with personal trainer   History of chicken pox    Hypercholesterolemia    Hyperlipidemia, mixed    Hypogonadism in male    a.) on exogenous TRT (testosteronew cypionate) injections   Hypothyroidism, postsurgical    Iron  deficiency anemia    Obstructive sleep apnea on CPAP    Radiculopathy, lumbar region    Rate-related  LEFT bundle branch block    ST elevation myocardial infarction (STEMI) of anterior wall (HCC) 11/25/2020   a.) LHC/PCI: 40% OM2, 99% pLAD (3.0 x 26 mm Resolute Onyx DES), 50% mLAD, 30% pRCA   Thyroid  cancer (HCC) 2009   a.) s/p total thyroidectomy   Vertigo    Vitamin D  deficiency    Past Surgical History:  Procedure Laterality Date   CATARACT EXTRACTION W/ INTRAOCULAR LENS  IMPLANT, BILATERAL     CERVICAL DISC ARTHROPLASTY N/A 05/01/2023   Procedure: C5-6 ARTHROPLASTY;  Surgeon: Jodeen Munch, MD;  Location: [facility] ORS;  Service: Neurosurgery;  Laterality: N/A;   CHOLECYSTECTOMY  04/09/1998   COLONOSCOPY     1998, 2010   COLONOSCOPY WITH ESOPHAGOGASTRODUODENOSCOPY (EGD)  05/29/2018   COLONOSCOPY WITH ESOPHAGOGASTRODUODENOSCOPY (EGD)  01/26/2022   CORONARY/GRAFT ACUTE MI REVASCULARIZATION N/A 06/16/2020   Procedure: Coronary/Graft Acute MI Revascularization;  Surgeon: Percival Brace, MD;  Location: [facility]IVE [facility];  Service: Cardiovascular;  Laterality: N/A;   LEFT HEART CATH AND CORONARY ANGIOGRAPHY N/A 06/16/2020  Procedure: LEFT HEART CATH AND CORONARY ANGIOGRAPHY;  Surgeon: Percival Brace, MD;  Location: [facility] CV [facility];  Service: Cardiovascular;  Laterality: N/A;   LYMPH NODE DISSECTION  06/13/2007   NASAL SEPTUM SURGERY     RETINAL DETACHMENT SURGERY Left    TOTAL THYROIDECTOMY  06/13/2007   Family History  Problem Relation Age of Onset   Hyperlipidemia Father    Heart disease  Father    Social History   Socioeconomic History   Marital status: Married    Spouse name: Eveleen Hinds   Number of children: 3   Years of education: Not on file   Highest education level: Not on file  Occupational History   Not on file  Tobacco Use   Smoking status: Never   Smokeless tobacco: Never  Vaping Use   Vaping status: Never Used  Substance and Sexual Activity   Alcohol use: Yes    Comment: weekly   Drug use: No   Sexual activity: Not on file  Other Topics Concern   Not on file  Social History Narrative   ** Merged History Encounter **       Social Drivers of Corporate investment banker Strain: Not on file  Food Insecurity: Not on file  Transportation Needs: Not on file  Physical Activity: Not on file  Stress: Not on file  Social Connections: Not on file     Review of Systems  Constitutional:  Negative for appetite change and unexpected weight change.  HENT:  Negative for congestion and sinus pressure.   Respiratory:  Negative for cough, chest tightness and shortness of breath.   Cardiovascular:  Negative for chest pain, palpitations and leg swelling.  Gastrointestinal:  Negative for abdominal pain, diarrhea, nausea and vomiting.  Genitourinary:  Negative for difficulty urinating and dysuria.  Musculoskeletal:  Negative for joint swelling and myalgias.  Skin:  Negative for color change and rash.  Neurological:  Negative for dizziness and headaches.  Psychiatric/Behavioral:  Negative for agitation and dysphoric mood.        Objective:     BP 124/68   Pulse 86   Temp 98 F (36.7 C)   Resp 16   Ht 6\' 1"  (1.854 m)   Wt 203 lb (92.1 kg)   SpO2 98%   BMI 26.78 kg/m  Wt Readings from Last 3 Encounters:  08/02/23 203 lb (92.1 kg)  07/25/23 203 lb (92.1 kg)  06/14/23 203 lb (92.1 kg)    Physical Exam Constitutional:      General: He is not in acute distress.    Appearance: Normal appearance. He is well-developed.  HENT:     Head: Normocephalic  and atraumatic.     Right Ear: External ear normal.     Left Ear: External ear normal.     Mouth/Throat:     Pharynx: No oropharyngeal exudate or posterior oropharyngeal erythema.  Eyes:     General: No scleral icterus.       Right eye: No discharge.        Left eye: No discharge.     Conjunctiva/sclera: Conjunctivae normal.  Neck:     Thyroid : No thyromegaly.  Cardiovascular:     Rate and Rhythm: Normal rate and regular rhythm.  Pulmonary:     Effort: No respiratory distress.     Breath sounds: Normal breath sounds. No wheezing.  Abdominal:     General: Bowel sounds are normal.     Palpations: Abdomen is soft.  Tenderness: There is no abdominal tenderness.  Musculoskeletal:        General: No swelling or tenderness.     Cervical back: Neck supple. No tenderness.  Lymphadenopathy:     Cervical: No cervical adenopathy.  Skin:    Findings: No erythema or rash.  Neurological:     Mental Status: He is alert and oriented to person, place, and time.  Psychiatric:        Mood and Affect: Mood normal.        Behavior: Behavior normal.         Outpatient Encounter Medications as of 08/02/2023  Medication Sig   Alirocumab  (PRALUENT ) 150 MG/ML SOAJ Inject 1 mL (150 mg total) into the skin every 14 (fourteen) days.   aspirin  81 MG chewable tablet Chew 1 tablet (81 mg total) by mouth daily.   Cholecalciferol  (VITAMIN D ) 50 MCG (2000 UT) tablet Take 2,000 Units by mouth daily.   cycloSPORINE  (RESTASIS ) 0.05 % ophthalmic emulsion Place 1 drop into both eyes 2 (two) times daily.   ketoconazole  (NIZORAL ) 2 % shampoo Apply 1 Application topically 3 (three) times a week. Wash scalp 3 times weekly, let sit 5 minutes and rinse out   levothyroxine  (SYNTHROID ) 175 MCG tablet Take 175 mcg by mouth daily before breakfast.   minoxidil  (LONITEN ) 2.5 MG tablet Take 2 tablets (5 mg total) by mouth daily. (Patient taking differently: Take 2.5 mg by mouth daily.)   Multiple Vitamins-Minerals  (PRESERVISION AREDS 2) CAPS Take 1 capsule by mouth 2 (two) times daily.   Ruxolitinib  Phosphate (OPZELURA ) 1.5 % CREA Apply 1 Application topically daily to areas of vitiligo on body   testosterone  cypionate (DEPOTESTOSTERONE CYPIONATE) 200 MG/ML injection Inject 1 mL (200 mg total) into the muscle every 14 (fourteen) days.   valACYclovir  (VALTREX ) 1000 MG tablet Take 1 tablet (1,000 mg total) by mouth as directed. Take 2 tablets at onset of fever blister symptoms then 2 tablets 12 hours later   [DISCONTINUED] atorvastatin  (LIPITOR ) 10 MG tablet Take 1 tablet (10 mg total) by mouth once daily (Patient not taking: Reported on 04/29/2023)   No facility-administered encounter medications on file as of 08/02/2023.     Lab Results  Component Value Date   WBC 5.1 06/10/2023   HGB 14.9 06/10/2023   HCT 45.0 06/10/2023   PLT 208 06/10/2023   GLUCOSE 107 (H) 06/10/2023   CHOL 165 01/25/2023   TRIG 85.0 01/25/2023   HDL 46.20 01/25/2023   LDLCALC 102 (H) 01/25/2023   ALT 18 01/25/2023   AST 16 01/25/2023   NA 138 06/10/2023   K 4.0 06/10/2023   CL 102 06/10/2023   CREATININE 1.20 06/10/2023   BUN 20 06/10/2023   CO2 27 06/10/2023   TSH 3.55 01/25/2023   PSA 0.57 01/25/2023   INR 1.0 06/16/2020   HGBA1C 5.7 09/04/2019       Assessment & Plan:  Prostate cancer screening -     PSA; Future  Hypercholesterolemia Assessment & Plan: Continues on praluent . Off statin. Low cholesterol diet and exercise. Follow lipid panel.   Orders: -     Basic metabolic panel with GFR; Future -     Lipid panel; Future -     Hepatic function panel; Future  Iron  deficiency anemia, unspecified iron  deficiency anemia type Assessment & Plan: Has been receiving iron  infusions.  Continue f/u with Dr Valentine Gasmen. EGD: 01/26/2022 - no endoscopic abnormality evident to explain patient's complaint of dysphagia, normal esophagus, gastritis, normal duodenum.  No sources of anemia found. CSY: 01/26/2022 - normal  examined colon, Grade II non-bleeding IH.  Has f/u labs next week. Continue f/u with hematology.    Coronary artery disease involving native coronary artery of native heart with angina pectoris [facility]) Assessment & Plan: History of anterior STEMI s/p PCI/DES of proximal LAD.  Continue aspirin  and praluent  and lipitor . Saw cardiology 04/11/23. Exercise stress echo with no ischemia 03/2023. Exercises with no complaints of chest pain/pressure. Has known CAD with STEMI in 2022 s/p PCI of proximal LAD. Continues on praluent .  Continue f/u with cardiology.    Low testosterone  Assessment & Plan: Seeing Dr Cherylene Corrente. Feels better with testosterone  replacement. Check psa today. Continue f/u with Dr Cherylene Corrente.    Obstructive sleep apnea Assessment & Plan: Continue cpap.    THYROID  CANCER, HX OF Assessment & Plan: Followed by Dr Jesse Moritz. Has f/u planned 10/04/23.       [name], MD

## 2023-08-04 ENCOUNTER — Encounter: Payer: Self-pay | Admitting: Internal Medicine

## 2023-08-04 NOTE — Assessment & Plan Note (Signed)
 Seeing Dr Cherylene Corrente. Feels better with testosterone  replacement. Check psa today. Continue f/u with Dr Cherylene Corrente.

## 2023-08-04 NOTE — Assessment & Plan Note (Signed)
 History of anterior STEMI s/p PCI/DES of proximal LAD.  Continue aspirin  and praluent  and lipitor . Saw cardiology 04/11/23. Exercise stress echo with no ischemia 03/2023. Exercises with no complaints of chest pain/pressure. Has known CAD with STEMI in 2022 s/p PCI of proximal LAD. Continues on praluent .  Continue f/u with cardiology.

## 2023-08-04 NOTE — Assessment & Plan Note (Signed)
 Continue cpap.

## 2023-08-04 NOTE — Assessment & Plan Note (Signed)
 Followed by Dr Jesse Moritz. Has f/u planned 10/04/23.

## 2023-08-04 NOTE — Assessment & Plan Note (Signed)
 Continues on praluent . Off statin. Low cholesterol diet and exercise. Follow lipid panel.

## 2023-08-04 NOTE — Assessment & Plan Note (Signed)
 Has been receiving iron  infusions.  Continue f/u with Dr Valentine Gasmen. EGD: 01/26/2022 - no endoscopic abnormality evident to explain patient's complaint of dysphagia, normal esophagus, gastritis, normal duodenum. No sources of anemia found. CSY: 01/26/2022 - normal examined colon, Grade II non-bleeding IH.  Has f/u labs next week. Continue f/u with hematology.

## 2023-08-08 ENCOUNTER — Encounter: Payer: Self-pay | Admitting: Internal Medicine

## 2023-08-09 ENCOUNTER — Telehealth: Payer: Self-pay

## 2023-08-09 DIAGNOSIS — G4733 Obstructive sleep apnea (adult) (pediatric): Secondary | ICD-10-CM | POA: Diagnosis not present

## 2023-08-09 NOTE — Telephone Encounter (Signed)
 See mychart.

## 2023-08-09 NOTE — Telephone Encounter (Signed)
 Copied from CRM [id number]. Topic: Clinical - Lab/Test Results >> Aug 09, 2023 10:50 AM Adonis Hoot wrote: Reason for CRM: Patient is requesting a phone call regarding lab work that he has to have done at cancer center on Monday.

## 2023-08-09 NOTE — Telephone Encounter (Signed)
 Called patient to let him know that we were not able to add labs to [facility].

## 2023-08-12 ENCOUNTER — Other Ambulatory Visit

## 2023-08-12 ENCOUNTER — Telehealth: Payer: Self-pay | Admitting: *Deleted

## 2023-08-12 ENCOUNTER — Inpatient Hospital Stay: Attending: Internal Medicine

## 2023-08-12 ENCOUNTER — Telehealth: Payer: Self-pay

## 2023-08-12 ENCOUNTER — Other Ambulatory Visit: Payer: Self-pay | Admitting: *Deleted

## 2023-08-12 DIAGNOSIS — K922 Gastrointestinal hemorrhage, unspecified: Secondary | ICD-10-CM | POA: Insufficient documentation

## 2023-08-12 DIAGNOSIS — R7989 Other specified abnormal findings of blood chemistry: Secondary | ICD-10-CM

## 2023-08-12 DIAGNOSIS — E611 Iron deficiency: Secondary | ICD-10-CM

## 2023-08-12 DIAGNOSIS — D500 Iron deficiency anemia secondary to blood loss (chronic): Secondary | ICD-10-CM | POA: Diagnosis present

## 2023-08-12 LAB — CMP (CANCER CENTER ONLY)
ALT: 17 U/L (ref 0–44)
AST: 20 U/L (ref 15–41)
Albumin: 4.3 g/dL (ref 3.5–5.0)
Alkaline Phosphatase: 70 U/L (ref 38–126)
Anion gap: 9 (ref 5–15)
BUN: 20 mg/dL (ref 8–23)
CO2: 26 mmol/L (ref 22–32)
Calcium: 8.6 mg/dL — ABNORMAL LOW (ref 8.9–10.3)
Chloride: 103 mmol/L (ref 98–111)
Creatinine: 1.25 mg/dL — ABNORMAL HIGH (ref 0.61–1.24)
GFR, Estimated: >60 mL/min (ref >60)
Glucose, Bld: 106 mg/dL — ABNORMAL HIGH (ref 70–99)
Potassium: 4.1 mmol/L (ref 3.5–5.1)
Sodium: 138 mmol/L (ref 135–145)
Total Bilirubin: 0.6 mg/dL (ref 0.0–1.2)
Total Protein: 7.2 g/dL (ref 6.5–8.1)

## 2023-08-12 LAB — CBC WITH DIFFERENTIAL (CANCER CENTER ONLY)
Abs Immature Granulocytes: 0.01 10*3/uL (ref 0.00–0.07)
Basophils Absolute: 0 10*3/uL (ref 0.0–0.1)
Basophils Relative: 1 %
Eosinophils Absolute: 0.2 10*3/uL (ref 0.0–0.5)
Eosinophils Relative: 4 %
HCT: 39.8 % (ref 39.0–52.0)
Hemoglobin: 13.1 g/dL (ref 13.0–17.0)
Immature Granulocytes: 0 %
Lymphocytes Relative: 40 %
Lymphs Abs: 1.6 10*3/uL (ref 0.7–4.0)
MCH: 26.4 pg (ref 26.0–34.0)
MCHC: 32.9 g/dL (ref 30.0–36.0)
MCV: 80.1 fL (ref 80.0–100.0)
Monocytes Absolute: 0.4 10*3/uL (ref 0.1–1.0)
Monocytes Relative: 10 %
Neutro Abs: 1.9 10*3/uL (ref 1.7–7.7)
Neutrophils Relative %: 45 %
Platelet Count: 239 10*3/uL (ref 150–400)
RBC: 4.97 MIL/uL (ref 4.22–5.81)
RDW: 14.4 % (ref 11.5–15.5)
WBC Count: 4.1 10*3/uL (ref 4.0–10.5)
nRBC: 0 % (ref 0.0–0.2)

## 2023-08-12 LAB — IRON AND TIBC
Iron: 40 ug/dL — ABNORMAL LOW (ref 45–182)
Saturation Ratios: 9 % — ABNORMAL LOW (ref 17.9–39.5)
TIBC: 469 ug/dL — ABNORMAL HIGH (ref 250–450)
UIBC: 429 ug/dL

## 2023-08-12 LAB — FERRITIN: Ferritin: 8 ng/mL — ABNORMAL LOW (ref 24–336)

## 2023-08-12 LAB — LIPID PANEL
Cholesterol: 165 mg/dL (ref 0–200)
HDL: 51 mg/dL (ref >40)
LDL Cholesterol: 105 mg/dL — ABNORMAL HIGH (ref 0–99)
Total CHOL/HDL Ratio: 3.2 ratio
Triglycerides: 47 mg/dL (ref <150)
VLDL: 9 mg/dL (ref 0–40)

## 2023-08-12 NOTE — Telephone Encounter (Signed)
 Dr. Valentine Gasmen put it in so that he could be stuck just 1 time.  Dr. Alphia Asa was thankful for being able to do that.

## 2023-08-12 NOTE — Telephone Encounter (Signed)
 Copied from CRM [id number]. Topic: Clinical - Request for Lab/Test Order >> Aug 12, 2023  8:41 AM Deaijah H wrote: Reason for CRM: Patients would like Dr. Geralyn Knee to get in contact with Cancer center to confirm what bloodwork needs to be ordered.

## 2023-08-12 NOTE — Telephone Encounter (Signed)
 FYI  The cancer center did draw his fasting labs. (CBC and CMP). Advised patient that a PSA that was ordered was not drawn but he said that was fine and he would like to wait until he sees you in October to have drawn.

## 2023-08-13 ENCOUNTER — Telehealth: Payer: Self-pay | Admitting: *Deleted

## 2023-08-13 LAB — TESTOSTERONE,FREE AND TOTAL
Testosterone, Free: 10.5 pg/mL (ref 6.6–18.1)
Testosterone: 427 ng/dL (ref 264–916)

## 2023-08-13 NOTE — Telephone Encounter (Signed)
 The patient called today stating that he could not see all of the labs except for the total testosterone  and it is not back yet because it had to go through [facility] and it still not resulted at this time/the patient is fine he says as long as it was done and its on the way of getting resulted he is good with that

## 2023-08-14 ENCOUNTER — Encounter: Payer: Self-pay | Admitting: Urology

## 2023-08-15 ENCOUNTER — Inpatient Hospital Stay

## 2023-08-15 VITALS — BP 138/75 | HR 85 | Temp 97.7°F | Resp 16

## 2023-08-15 DIAGNOSIS — D500 Iron deficiency anemia secondary to blood loss (chronic): Secondary | ICD-10-CM | POA: Diagnosis not present

## 2023-08-15 DIAGNOSIS — E611 Iron deficiency: Secondary | ICD-10-CM

## 2023-08-15 MED ORDER — IRON SUCROSE 20 MG/ML IV SOLN
200.0000 mg | Freq: Once | INTRAVENOUS | Status: AC
Start: 2023-08-15 — End: 2023-08-15
  Administered 2023-08-15: 200 mg via INTRAVENOUS

## 2023-08-15 NOTE — Patient Instructions (Signed)

## 2023-08-16 ENCOUNTER — Encounter: Payer: Self-pay | Admitting: Internal Medicine

## 2023-08-16 ENCOUNTER — Ambulatory Visit

## 2023-08-16 NOTE — Telephone Encounter (Signed)
 Called and spoke to Dr Bary Likes about labs. Will contact Dr Cherylene Corrente since PSA was not drawn.

## 2023-08-16 NOTE — Telephone Encounter (Signed)
 Fyi.

## 2023-08-17 ENCOUNTER — Other Ambulatory Visit: Payer: Self-pay

## 2023-08-19 ENCOUNTER — Other Ambulatory Visit: Payer: Self-pay | Admitting: Urology

## 2023-08-19 ENCOUNTER — Other Ambulatory Visit: Payer: Self-pay

## 2023-08-19 MED ORDER — TESTOSTERONE CYPIONATE 200 MG/ML IM SOLN
200.0000 mg | INTRAMUSCULAR | 0 refills | Status: DC
  Filled 2023-08-19: qty 6, 84d supply, fill #0

## 2023-08-19 MED ORDER — RESTASIS 0.05 % OP EMUL
1.0000 [drp] | Freq: Two times a day (BID) | OPHTHALMIC | 3 refills | Status: AC
Start: 2023-08-19 — End: ?
  Filled 2023-08-19: qty 180, 90d supply, fill #0
  Filled 2024-02-13: qty 180, 90d supply, fill #1

## 2023-08-20 ENCOUNTER — Other Ambulatory Visit: Payer: Self-pay

## 2023-08-21 ENCOUNTER — Other Ambulatory Visit: Payer: Self-pay

## 2023-08-21 MED ORDER — RESTASIS 0.05 % OP EMUL
1.0000 [drp] | Freq: Two times a day (BID) | OPHTHALMIC | 3 refills | Status: AC
  Filled 2023-08-21 – 2023-11-15 (×2): qty 180, 90d supply, fill #0

## 2023-08-26 ENCOUNTER — Other Ambulatory Visit: Payer: Self-pay

## 2023-08-27 ENCOUNTER — Other Ambulatory Visit: Payer: Self-pay | Admitting: Internal Medicine

## 2023-08-29 ENCOUNTER — Other Ambulatory Visit: Payer: Self-pay

## 2023-08-29 MED ORDER — CIPROFLOXACIN HCL 500 MG PO TABS
500.0000 mg | ORAL_TABLET | Freq: Two times a day (BID) | ORAL | 1 refills | Status: AC | PRN
  Filled 2023-08-29: qty 28, 14d supply, fill #0
  Filled 2024-04-04: qty 28, 14d supply, fill #1

## 2023-08-29 MED ORDER — PREDNISONE 10 MG PO TABS
ORAL_TABLET | ORAL | 0 refills | Status: DC
  Filled 2023-08-29 (×2): qty 90, 30d supply, fill #0

## 2023-08-29 MED ORDER — SCOPOLAMINE 1 MG/3DAYS TD PT72
1.0000 | MEDICATED_PATCH | TRANSDERMAL | 2 refills | Status: AC | PRN
  Filled 2023-08-29: qty 10, 30d supply, fill #0
  Filled 2024-04-04: qty 10, 30d supply, fill #1

## 2023-08-29 MED ORDER — MECLIZINE HCL 25 MG PO TABS
25.0000 mg | ORAL_TABLET | Freq: Three times a day (TID) | ORAL | 1 refills | Status: AC | PRN
  Filled 2023-08-29: qty 30, 10d supply, fill #0
  Filled 2024-04-04: qty 30, 10d supply, fill #1

## 2023-08-29 MED ORDER — PROMETHAZINE HCL 25 MG RE SUPP
25.0000 mg | Freq: Three times a day (TID) | RECTAL | 2 refills | Status: AC | PRN
  Filled 2023-08-29: qty 12, 4d supply, fill #0
  Filled 2024-04-04: qty 12, 4d supply, fill #1

## 2023-08-30 ENCOUNTER — Other Ambulatory Visit: Payer: Self-pay

## 2023-08-30 MED ORDER — TOBRAMYCIN 0.3 % OP SOLN
1.0000 [drp] | Freq: Four times a day (QID) | OPHTHALMIC | 4 refills | Status: AC
Start: 2023-08-30 — End: ?
  Filled 2023-08-30: qty 5, 3d supply, fill #0
  Filled 2023-10-03: qty 5, 3d supply, fill #1
  Filled 2023-11-04: qty 5, 3d supply, fill #2
  Filled 2023-12-05: qty 5, 3d supply, fill #3
  Filled 2024-01-27: qty 5, 3d supply, fill #4

## 2023-09-10 ENCOUNTER — Other Ambulatory Visit: Payer: Self-pay

## 2023-09-16 ENCOUNTER — Telehealth: Payer: Self-pay

## 2023-09-16 ENCOUNTER — Encounter: Payer: Self-pay | Admitting: Internal Medicine

## 2023-09-16 NOTE — Telephone Encounter (Signed)
 Copied from CRM [id number]. Topic: General - Other >> Sep 16, 2023  9:17 AM Matthew Kelley wrote: Reason for CRM: Pt is still requesting a call back regarding his referral for inspire device. Pt's phone number is [phone] ok to leave a vm. Pt stated he prefers Monday morning, Thursday afternoon, and anytime Friday.  ATC LVM on patient's phone that patient will need to make a f/u with Beth to get a new sleep study done and after that our office can send a referral for patient to be evaluated for  inspire device.

## 2023-09-19 ENCOUNTER — Ambulatory Visit (INDEPENDENT_AMBULATORY_CARE_PROVIDER_SITE_OTHER): Admitting: Sleep Medicine

## 2023-09-19 ENCOUNTER — Encounter: Payer: Self-pay | Admitting: Sleep Medicine

## 2023-09-19 VITALS — BP 122/80 | HR 75 | Temp 97.8°F | Ht 73.0 in | Wt 200.4 lb

## 2023-09-19 DIAGNOSIS — I2510 Atherosclerotic heart disease of native coronary artery without angina pectoris: Secondary | ICD-10-CM

## 2023-09-19 DIAGNOSIS — G4733 Obstructive sleep apnea (adult) (pediatric): Secondary | ICD-10-CM | POA: Diagnosis not present

## 2023-09-19 NOTE — Progress Notes (Signed)
 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 w/ a h/o CAD and hypothyroidism who presents for CPAP follow up visit and to discuss Inspire therapy. Reports using CPAP therapy every night, which is confirmed by compliance data. He is currently using the Tap PAP nasal pillow mask, which is comfortable. Reports feeling refreshed upon awakening. States that he is Interested in [employer] therapy and would like to have a reassessment for OSA to see if he is a candidate for [employer] therapy.   EPWORTH SLEEP SCORE    04/18/2022    4:00 PM  Results of the Epworth flowsheet  Sitting and reading 3  Watching TV 3  Sitting, inactive in a public place (e.g. a theatre or a meeting) 3  As a passenger in a car for an hour without a break 3  Lying down to rest in the afternoon when circumstances permit 3  Sitting and talking to someone 0  Sitting quietly after a lunch without alcohol 2  In a car, while stopped for a few minutes in traffic 0  Total score 17    PAST MEDICAL HISTORY :   has a past medical history of Aortic atherosclerosis (HCC), Atypical nevus (09/07/2008), Blood in stool, Coronary artery disease involving native coronary artery of native heart without angina pectoris (06/16/2020), DDD (degenerative disc disease), cervical, Detached retina, left (2010), Diastolic dysfunction, Dysplastic nevus (12/20/2009), Dysplastic nevus (12/20/2009), Dysplastic nevus (11/24/2018), Dysplastic nevus (11/24/2018), GI bleed, Herniation of cervical intervertebral disc with radiculopathy (12/2022), History of chicken pox, Hypercholesterolemia, Hyperlipidemia, mixed, Hypogonadism in male, Hypothyroidism, postsurgical, Iron  deficiency anemia, Obstructive sleep apnea on CPAP, Radiculopathy, lumbar region, Rate-related  LEFT bundle branch block, ST elevation myocardial infarction (STEMI) of anterior wall  (HCC) (11/25/2020), Thyroid  cancer (HCC) (2009), Vertigo, and Vitamin D  deficiency.  has a past surgical history that includes Cholecystectomy (04/09/1998); Total thyroidectomy (06/13/2007); Coronary/Graft Acute MI Revascularization (N/A, 06/16/2020); LEFT HEART CATH AND CORONARY ANGIOGRAPHY (N/A, 06/16/2020); Colonoscopy with esophagogastroduodenoscopy (egd) (05/29/2018); Colonoscopy with esophagogastroduodenoscopy (egd) (01/26/2022); Colonoscopy; Nasal septum surgery; Lymph node dissection (06/13/2007); Cataract extraction w/ intraocular lens  implant, bilateral; Retinal detachment surgery (Left); and Cervical disc arthroplasty (N/A, 05/01/2023). Prior to Admission medications   Medication Sig Start Date End Date Taking? Authorizing Provider  Alirocumab  (PRALUENT ) 150 MG/ML SOAJ Inject 1 mL (150 mg total) into the skin every 14 (fourteen) days. 04/11/23  Yes   aspirin  81 MG chewable tablet Chew 1 tablet (81 mg total) by mouth daily. 06/18/20  Yes Patel, Sona, MD  Cholecalciferol  (VITAMIN D ) 50 MCG (2000 UT) tablet Take 2,000 Units by mouth daily.   Yes [provider]  ciprofloxacin  (CIPRO ) 500 MG tablet Take 1 tablet (500 mg total) by mouth 2 (two) times daily as needed for severe diarrhea. 08/29/23  Yes   ketoconazole  (NIZORAL ) 2 % shampoo Apply 1 Application topically 3 (three) times a week. Wash scalp 3 times weekly, let sit 5 minutes and rinse out 02/18/23  Yes Artemio Larry, MD  levothyroxine  (SYNTHROID ) 175 MCG tablet Take 175 mcg by mouth daily before breakfast.   Yes [provider]  meclizine  (ANTIVERT ) 25 MG tablet Take 1 tablet (25 mg total) by mouth every 8 (eight) hours as needed for vertigo. 08/29/23  Yes   minoxidil  (LONITEN ) 2.5 MG tablet Take 2 tablets (5 mg total) by mouth daily. Patient taking differently: Take 2.5 mg  by mouth daily. 02/18/23  Yes Artemio Larry, MD  Multiple Vitamins-Minerals (PRESERVISION AREDS 2) CAPS Take 1 capsule by mouth 2 (two) times daily.  07/11/13  Yes [provider]  promethazine  (PHENERGAN ) 25 MG suppository Place 1 suppository (25 mg total) rectally every 8 (eight) hours as needed for nausea/vomiting. 08/29/23  Yes   RESTASIS  0.05 % ophthalmic emulsion Place 1 drop into both eyes 2 (two) times daily. 08/19/23  Yes   RESTASIS  0.05 % ophthalmic emulsion Place 1 drop into both eyes 2 (two) times daily. 08/21/23  Yes   Ruxolitinib  Phosphate (OPZELURA ) 1.5 % CREA Apply 1 Application topically daily to areas of vitiligo on body 02/18/23  Yes Artemio Larry, MD  scopolamine  (TRANSDERM-SCOP) 1 MG/3DAYS Place 1 patch (1.5 mg total) onto the skin behind the year every three (3) days as needed for vertigo, do not get ointment in the eyes. 08/29/23  Yes   testosterone  cypionate (DEPOTESTOSTERONE CYPIONATE) 200 MG/ML injection Inject 1 mL (200 mg total) into the muscle every 14 (fourteen) days. 08/19/23  Yes Stoioff, Kizzie Perks, MD  tobramycin  (TOBREX ) 0.3 % ophthalmic solution Place 1 drop into both eyes 4 (four) times daily for 3 days 08/30/23  Yes   valACYclovir  (VALTREX ) 1000 MG tablet Take 1 tablet (1,000 mg total) by mouth as directed. Take 2 tablets at onset of fever blister symptoms then 2 tablets 12 hours later 02/18/23  Yes Artemio Larry, MD  predniSONE  (DELTASONE ) 10 MG tablet as directed Patient not taking: Reported on 09/19/2023 08/29/23      Allergies  Allergen Reactions   Halcion [Triazolam] Other (See Comments)    Moody,angry    FAMILY HISTORY:  family history includes Heart disease in his father; Hyperlipidemia in his father. SOCIAL HISTORY:  reports that he has never smoked. He has never used smokeless tobacco. He reports current alcohol use. He reports that he does not use drugs.   Review of Systems:  Gen:  Denies  fever, sweats, chills weight loss  HEENT: Denies blurred vision, double vision, ear pain, eye pain, hearing loss, nose bleeds, sore throat Cardiac:  No dizziness, chest pain or heaviness, chest  tightness,edema, No JVD Resp:   No cough, -sputum production, -shortness of breath,-wheezing, -hemoptysis,  Gi: Denies swallowing difficulty, stomach pain, nausea or vomiting, diarrhea, constipation, bowel incontinence Gu:  Denies bladder incontinence, burning urine Ext:   Denies Joint pain, stiffness or swelling Skin: Denies  skin rash, easy bruising or bleeding or hives Endoc:  Denies polyuria, polydipsia , polyphagia or weight change Psych:   Denies depression, insomnia or hallucinations  Other:  All other systems negative  VITAL SIGNS: BP 122/80 (BP Location: Left Arm, Patient Position: Sitting, Cuff Size: Normal)   Pulse 75   Temp 97.8 F (36.6 C) (Oral)   Ht 6' 1 (1.854 m)   Wt 200 lb 6.4 oz (90.9 kg)   SpO2 95%   BMI 26.44 kg/m    Physical Examination:   General Appearance: No distress  EYES PERRLA, EOM intact.   NECK Supple, No JVD Pulmonary: normal breath sounds, No wheezing.  CardiovascularNormal S1,S2.  No m/r/g.   Abdomen: Benign, Soft, non-tender. Skin:   warm, no rashes, no ecchymosis  Extremities: normal, no cyanosis, clubbing. Neuro:without focal findings,  speech normal  PSYCHIATRIC: Mood, affect within normal limits.   ASSESSMENT AND PLAN  OSA Patient would like to explore Inspire therapy, will complete HST for reassessment of OSA. Discussed the consequences of untreated sleep apnea. Advised not to drive  drowsy for safety of patient and others. Will complete further evaluation with a home sleep study and refer patient to ENT for DISE.    CAD Stable, on current management.     MEDICATION ADJUSTMENTS/LABS AND TESTS ORDERED: Recommend Sleep Study   Patient  satisfied with Plan of action and management. All questions answered  Follow up to review HST results and treatment plan.   I spent a total of 32 minutes reviewing chart data, face-to-face evaluation with the patient, counseling and coordination of care as detailed above.    Brownie Gockel,  M.D.  Sleep Medicine [facility]

## 2023-09-19 NOTE — Patient Instructions (Addendum)
 Matthew Kelley

## 2023-10-02 ENCOUNTER — Telehealth: Payer: Self-pay

## 2023-10-02 NOTE — Telephone Encounter (Signed)
 Copied from CRM [id number]. Topic: General - Other >> Sep 13, 2023 10:13 AM Rilla B wrote: Reason for CRM: Patient called and would like a referral to ENT to consider the [facility]. Unsure if he needs an appt to get the referral. Please call.   This was already handled per 09-16-23 telephone encounter. Referral was already made. Completing note as we do not need duplicates.

## 2023-10-03 ENCOUNTER — Other Ambulatory Visit: Payer: Self-pay

## 2023-10-14 ENCOUNTER — Other Ambulatory Visit

## 2023-10-14 ENCOUNTER — Inpatient Hospital Stay: Attending: Internal Medicine

## 2023-10-14 DIAGNOSIS — D500 Iron deficiency anemia secondary to blood loss (chronic): Secondary | ICD-10-CM | POA: Insufficient documentation

## 2023-10-14 DIAGNOSIS — K922 Gastrointestinal hemorrhage, unspecified: Secondary | ICD-10-CM | POA: Diagnosis not present

## 2023-10-14 DIAGNOSIS — E611 Iron deficiency: Secondary | ICD-10-CM

## 2023-10-14 LAB — CBC WITH DIFFERENTIAL (CANCER CENTER ONLY)
Abs Immature Granulocytes: 0.01 K/uL (ref 0.00–0.07)
Basophils Absolute: 0.1 K/uL (ref 0.0–0.1)
Basophils Relative: 1 %
Eosinophils Absolute: 0.2 K/uL (ref 0.0–0.5)
Eosinophils Relative: 4 %
HCT: 41.4 % (ref 39.0–52.0)
Hemoglobin: 14 g/dL (ref 13.0–17.0)
Immature Granulocytes: 0 %
Lymphocytes Relative: 34 %
Lymphs Abs: 1.6 K/uL (ref 0.7–4.0)
MCH: 27.4 pg (ref 26.0–34.0)
MCHC: 33.8 g/dL (ref 30.0–36.0)
MCV: 81 fL (ref 80.0–100.0)
Monocytes Absolute: 0.5 K/uL (ref 0.1–1.0)
Monocytes Relative: 10 %
Neutro Abs: 2.4 K/uL (ref 1.7–7.7)
Neutrophils Relative %: 51 %
Platelet Count: 197 K/uL (ref 150–400)
RBC: 5.11 MIL/uL (ref 4.22–5.81)
RDW: 17.2 % — ABNORMAL HIGH (ref 11.5–15.5)
WBC Count: 4.7 K/uL (ref 4.0–10.5)
nRBC: 0 % (ref 0.0–0.2)

## 2023-10-14 LAB — IRON AND TIBC
Iron: 59 ug/dL (ref 45–182)
Saturation Ratios: 16 % — ABNORMAL LOW (ref 17.9–39.5)
TIBC: 371 ug/dL (ref 250–450)
UIBC: 312 ug/dL

## 2023-10-14 LAB — FERRITIN: Ferritin: 20 ng/mL — ABNORMAL LOW (ref 24–336)

## 2023-10-17 ENCOUNTER — Inpatient Hospital Stay

## 2023-10-17 VITALS — BP 131/81 | HR 85 | Temp 97.5°F | Resp 16

## 2023-10-17 DIAGNOSIS — E611 Iron deficiency: Secondary | ICD-10-CM

## 2023-10-17 DIAGNOSIS — D500 Iron deficiency anemia secondary to blood loss (chronic): Secondary | ICD-10-CM | POA: Diagnosis not present

## 2023-10-17 DIAGNOSIS — K922 Gastrointestinal hemorrhage, unspecified: Secondary | ICD-10-CM | POA: Diagnosis not present

## 2023-10-17 MED ORDER — IRON SUCROSE 20 MG/ML IV SOLN
200.0000 mg | Freq: Once | INTRAVENOUS | Status: AC
Start: 2023-10-17 — End: 2023-10-17
  Administered 2023-10-17: 200 mg via INTRAVENOUS

## 2023-10-18 ENCOUNTER — Ambulatory Visit

## 2023-10-18 DIAGNOSIS — M5090 Cervical disc disorder, unspecified, unspecified cervical region: Secondary | ICD-10-CM | POA: Diagnosis not present

## 2023-10-18 DIAGNOSIS — E230 Hypopituitarism: Secondary | ICD-10-CM | POA: Diagnosis not present

## 2023-10-18 DIAGNOSIS — C73 Malignant neoplasm of thyroid gland: Secondary | ICD-10-CM | POA: Diagnosis not present

## 2023-10-18 DIAGNOSIS — G4730 Sleep apnea, unspecified: Secondary | ICD-10-CM | POA: Diagnosis not present

## 2023-10-18 DIAGNOSIS — E559 Vitamin D deficiency, unspecified: Secondary | ICD-10-CM | POA: Diagnosis not present

## 2023-10-18 DIAGNOSIS — D500 Iron deficiency anemia secondary to blood loss (chronic): Secondary | ICD-10-CM | POA: Diagnosis not present

## 2023-10-18 DIAGNOSIS — E890 Postprocedural hypothyroidism: Secondary | ICD-10-CM | POA: Diagnosis not present

## 2023-10-18 DIAGNOSIS — E785 Hyperlipidemia, unspecified: Secondary | ICD-10-CM | POA: Diagnosis not present

## 2023-10-18 DIAGNOSIS — H3530 Unspecified macular degeneration: Secondary | ICD-10-CM | POA: Diagnosis not present

## 2023-10-18 DIAGNOSIS — I2510 Atherosclerotic heart disease of native coronary artery without angina pectoris: Secondary | ICD-10-CM | POA: Diagnosis not present

## 2023-10-21 DIAGNOSIS — H353114 Nonexudative age-related macular degeneration, right eye, advanced atrophic with subfoveal involvement: Secondary | ICD-10-CM | POA: Diagnosis not present

## 2023-10-21 DIAGNOSIS — H4311 Vitreous hemorrhage, right eye: Secondary | ICD-10-CM | POA: Diagnosis not present

## 2023-10-21 DIAGNOSIS — H353123 Nonexudative age-related macular degeneration, left eye, advanced atrophic without subfoveal involvement: Secondary | ICD-10-CM | POA: Diagnosis not present

## 2023-10-31 ENCOUNTER — Other Ambulatory Visit: Payer: Self-pay

## 2023-10-31 DIAGNOSIS — E782 Mixed hyperlipidemia: Secondary | ICD-10-CM | POA: Diagnosis not present

## 2023-10-31 DIAGNOSIS — H4311 Vitreous hemorrhage, right eye: Secondary | ICD-10-CM | POA: Diagnosis not present

## 2023-10-31 DIAGNOSIS — I252 Old myocardial infarction: Secondary | ICD-10-CM | POA: Diagnosis not present

## 2023-10-31 DIAGNOSIS — Z955 Presence of coronary angioplasty implant and graft: Secondary | ICD-10-CM | POA: Diagnosis not present

## 2023-10-31 DIAGNOSIS — I2510 Atherosclerotic heart disease of native coronary artery without angina pectoris: Secondary | ICD-10-CM | POA: Diagnosis not present

## 2023-10-31 DIAGNOSIS — H353114 Nonexudative age-related macular degeneration, right eye, advanced atrophic with subfoveal involvement: Secondary | ICD-10-CM | POA: Diagnosis not present

## 2023-10-31 MED ORDER — ATORVASTATIN CALCIUM 10 MG PO TABS
10.0000 mg | ORAL_TABLET | Freq: Every day | ORAL | 3 refills | Status: AC
  Filled 2023-10-31: qty 90, 90d supply, fill #0
  Filled 2024-01-27: qty 90, 90d supply, fill #1

## 2023-11-01 ENCOUNTER — Other Ambulatory Visit: Payer: Self-pay | Admitting: Urology

## 2023-11-01 ENCOUNTER — Other Ambulatory Visit: Payer: Self-pay

## 2023-11-03 ENCOUNTER — Other Ambulatory Visit: Payer: Self-pay

## 2023-11-03 MED FILL — Testosterone Cypionate IM Inj in Oil 200 MG/ML: INTRAMUSCULAR | 84 days supply | Qty: 6 | Fill #0 | Status: CN

## 2023-11-04 ENCOUNTER — Other Ambulatory Visit: Payer: Self-pay

## 2023-11-04 MED FILL — Testosterone Cypionate IM Inj in Oil 200 MG/ML: INTRAMUSCULAR | 84 days supply | Qty: 6 | Fill #0 | Status: CN

## 2023-11-05 ENCOUNTER — Other Ambulatory Visit: Payer: Self-pay

## 2023-11-07 DIAGNOSIS — G4733 Obstructive sleep apnea (adult) (pediatric): Secondary | ICD-10-CM | POA: Diagnosis not present

## 2023-11-09 ENCOUNTER — Encounter

## 2023-11-09 DIAGNOSIS — G4733 Obstructive sleep apnea (adult) (pediatric): Secondary | ICD-10-CM

## 2023-11-11 ENCOUNTER — Other Ambulatory Visit: Payer: Self-pay

## 2023-11-11 MED FILL — Testosterone Cypionate IM Inj in Oil 200 MG/ML: INTRAMUSCULAR | 84 days supply | Qty: 6 | Fill #0 | Status: AC

## 2023-11-15 ENCOUNTER — Other Ambulatory Visit: Payer: Self-pay

## 2023-11-18 ENCOUNTER — Other Ambulatory Visit: Payer: Self-pay

## 2023-11-26 ENCOUNTER — Ambulatory Visit: Payer: Self-pay

## 2023-11-26 DIAGNOSIS — R069 Unspecified abnormalities of breathing: Secondary | ICD-10-CM | POA: Diagnosis not present

## 2023-11-26 DIAGNOSIS — R0683 Snoring: Secondary | ICD-10-CM | POA: Diagnosis not present

## 2023-12-13 ENCOUNTER — Other Ambulatory Visit: Payer: Self-pay | Admitting: *Deleted

## 2023-12-13 DIAGNOSIS — E611 Iron deficiency: Secondary | ICD-10-CM

## 2023-12-13 DIAGNOSIS — R7989 Other specified abnormal findings of blood chemistry: Secondary | ICD-10-CM

## 2023-12-16 ENCOUNTER — Other Ambulatory Visit

## 2023-12-16 ENCOUNTER — Inpatient Hospital Stay: Attending: Internal Medicine

## 2023-12-16 DIAGNOSIS — D509 Iron deficiency anemia, unspecified: Secondary | ICD-10-CM | POA: Diagnosis not present

## 2023-12-16 DIAGNOSIS — R7989 Other specified abnormal findings of blood chemistry: Secondary | ICD-10-CM

## 2023-12-16 DIAGNOSIS — E611 Iron deficiency: Secondary | ICD-10-CM

## 2023-12-16 LAB — CBC WITH DIFFERENTIAL (CANCER CENTER ONLY)
Abs Immature Granulocytes: 0.02 K/uL (ref 0.00–0.07)
Basophils Absolute: 0.1 K/uL (ref 0.0–0.1)
Basophils Relative: 1 %
Eosinophils Absolute: 0.2 K/uL (ref 0.0–0.5)
Eosinophils Relative: 4 %
HCT: 45.8 % (ref 39.0–52.0)
Hemoglobin: 15.6 g/dL (ref 13.0–17.0)
Immature Granulocytes: 0 %
Lymphocytes Relative: 30 %
Lymphs Abs: 1.5 K/uL (ref 0.7–4.0)
MCH: 28.8 pg (ref 26.0–34.0)
MCHC: 34.1 g/dL (ref 30.0–36.0)
MCV: 84.7 fL (ref 80.0–100.0)
Monocytes Absolute: 0.5 K/uL (ref 0.1–1.0)
Monocytes Relative: 11 %
Neutro Abs: 2.7 K/uL (ref 1.7–7.7)
Neutrophils Relative %: 54 %
Platelet Count: 189 K/uL (ref 150–400)
RBC: 5.41 MIL/uL (ref 4.22–5.81)
RDW: 16.2 % — ABNORMAL HIGH (ref 11.5–15.5)
WBC Count: 5 K/uL (ref 4.0–10.5)
nRBC: 0 % (ref 0.0–0.2)

## 2023-12-16 LAB — BASIC METABOLIC PANEL - CANCER CENTER ONLY
Anion gap: 9 (ref 5–15)
BUN: 19 mg/dL (ref 8–23)
CO2: 26 mmol/L (ref 22–32)
Calcium: 8.4 mg/dL — ABNORMAL LOW (ref 8.9–10.3)
Chloride: 104 mmol/L (ref 98–111)
Creatinine: 1.29 mg/dL — ABNORMAL HIGH (ref 0.61–1.24)
GFR, Estimated: >60 mL/min (ref >60)
Glucose, Bld: 114 mg/dL — ABNORMAL HIGH (ref 70–99)
Potassium: 4.3 mmol/L (ref 3.5–5.1)
Sodium: 139 mmol/L (ref 135–145)

## 2023-12-16 LAB — IRON AND TIBC
Iron: 120 ug/dL (ref 45–182)
Saturation Ratios: 34 % (ref 17.9–39.5)
TIBC: 353 ug/dL (ref 250–450)
UIBC: 233 ug/dL

## 2023-12-16 LAB — FERRITIN: Ferritin: 37 ng/mL (ref 24–336)

## 2023-12-20 ENCOUNTER — Encounter: Payer: Self-pay | Admitting: Internal Medicine

## 2023-12-20 ENCOUNTER — Inpatient Hospital Stay (HOSPITAL_BASED_OUTPATIENT_CLINIC_OR_DEPARTMENT_OTHER): Admitting: Internal Medicine

## 2023-12-20 ENCOUNTER — Inpatient Hospital Stay

## 2023-12-20 VITALS — BP 148/78 | HR 88 | Temp 98.2°F | Resp 16 | Ht 73.0 in

## 2023-12-20 VITALS — BP 145/82 | HR 80

## 2023-12-20 DIAGNOSIS — D509 Iron deficiency anemia, unspecified: Secondary | ICD-10-CM | POA: Diagnosis not present

## 2023-12-20 DIAGNOSIS — E611 Iron deficiency: Secondary | ICD-10-CM | POA: Diagnosis not present

## 2023-12-20 MED ORDER — SODIUM CHLORIDE 0.9% FLUSH
10.0000 mL | Freq: Once | INTRAVENOUS | Status: AC | PRN
  Administered 2023-12-20: 10 mL
  Filled 2023-12-20: qty 10

## 2023-12-20 MED ORDER — IRON SUCROSE 20 MG/ML IV SOLN
200.0000 mg | Freq: Once | INTRAVENOUS | Status: AC
  Administered 2023-12-20: 200 mg via INTRAVENOUS
  Filled 2023-12-20: qty 10

## 2023-12-20 NOTE — Assessment & Plan Note (Addendum)
#   Anemia- iron  deficiency-likely secondary to occult GI bleed [see below].  S/p Venofer .  # Proceed with Venofer -today- continue compliance with  gentle iron  and  barimelt as maintenance.  #Etiology: Iron  deficiency. unclear likely occult/ secondary to chronic GI bleeding/AVMs-s/p capsule study/ double balloon enteroscopy/capsule [April/MAY 2023]-do not suspect any bone marrow/PNH.  Status post referral to [facility]- EGD/[facility]- OCT-2023- WNL- stable.   # CAD - [off Brillinta- dec 2022; on asprin 81 mg/day]- stable.   # DISPOSITION: Fridays #  Venofer  today # in 3 months-  2-3 days prior-labs- iron  studies;ferritin-possible venofer  - # follow up in 6 months- MD; 2-3 days labs- cbc/bmp; iron  studies/ferritin-possible venofer - Dr.B

## 2023-12-20 NOTE — Progress Notes (Signed)
 Patient declined to wait the 30 minutes for post iron infusion observation today. Tolerated infusion well. VSS.

## 2023-12-20 NOTE — Progress Notes (Signed)
 Fatigue/weakness: NO Dyspena: NO  Light headedness: NO  Blood in stool: NO

## 2023-12-20 NOTE — Progress Notes (Signed)
 [facility] CONSULT NOTE  Patient Care Team: Glendia Shad, MD as PCP - General (Internal Medicine) Glendia Shad, MD (Internal Medicine) Rennie Cindy SAUNDERS, MD as Consulting Physician (Oncology) Wilburn Keller BROCKS, MD as Consulting Physician (Cardiology)  CHIEF COMPLAINTS/PURPOSE OF CONSULTATION: ANEMIA   HEMATOLOGY HISTORY:  # ANEMIA capsule- AVMs- actively bleeding [sep 2022]; EGD-; colonoscopy-FEB, 2020 < 2007- EGD-stretching of esophagus. prior history of GI bleed/AVMs more than 15 years ago. April/MAY 2023- GI balloon double enteroscopy/ May 2023 -capsule study-.study was unremarkable.   # CAD [on asprin 81 mg/day s/p stenting; off brillinta]  HISTORY OF PRESENTING ILLNESS: Ambulating independently.  Alone.  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 iron ] is here for follow-up.  Patient notes his energy level is improved. Currently on PO iron  No further episodes of GI bleed.  Review of Systems  Constitutional:  Positive for malaise/fatigue. Negative for chills, diaphoresis, fever and weight loss.  HENT:  Negative for nosebleeds and sore throat.   Eyes:  Negative for double vision.  Respiratory:  Negative for cough, hemoptysis, sputum production, shortness of breath and wheezing.   Cardiovascular:  Negative for chest pain, palpitations, orthopnea and leg swelling.  Gastrointestinal:  Negative for abdominal pain, blood in stool, constipation, diarrhea, heartburn, melena, nausea and vomiting.  Genitourinary:  Negative for dysuria, frequency and urgency.  Musculoskeletal:  Negative for back pain and joint pain.  Skin: Negative.  Negative for itching and rash.  Neurological:  Negative for dizziness, tingling, focal weakness, weakness and headaches.  Endo/Heme/Allergies:  Does not bruise/bleed easily.  Psychiatric/Behavioral:  Negative for depression. The patient is not  nervous/anxious and does not have insomnia.     MEDICAL HISTORY:  Past Medical History:  Diagnosis Date   Aortic atherosclerosis (HCC)    Atypical nevus 09/07/2008   Left scapula, lateral. Moderate atypia.   Blood in stool    Coronary artery disease involving native coronary artery of native heart without angina pectoris 06/16/2020   a.) anterior STEMI 06/16/2020 --> LHC/PCI: 40% OM2, 99% pLAD (3.0 x 26 mm Resolute Onyx DES), 50% mLAD, 30% pRCA; b.) cMRI 11/25/2020: EF 62%, subendocardial infarction (< 25% transmural) involving m-d anteroseptal wall involving the true apex and distal inf wall   DDD (degenerative disc disease), cervical    Detached retina, left 2010   Diastolic dysfunction    a.) TTE 09/08/2020: EF >55%, no RWMAs, G1DD, norm RVSF, triv MR/TR/PR; b.) stress TTE 03/15/2023: LBBB with peak stress, max workload 13.4 METS, dias function/doppler for regurg/stenosis not performed   Dysplastic nevus 12/20/2009   Spinal lower back. Moderate atypia, edges free   Dysplastic nevus 12/20/2009   Left paraspinal lower back. Mild atypia, edges free.   Dysplastic nevus 11/24/2018   Right lower back. Mild atypia, deep margin involved.   Dysplastic nevus 11/24/2018   Right mid back. Moderate atypia and halo nevus effect. Limited margins free.   GI bleed    Herniation of cervical intervertebral disc with radiculopathy 12/2022   a.) secondary to exercise/regimen with personal trainer   History of chicken pox    Hypercholesterolemia    Hyperlipidemia, mixed    Hypogonadism in male    a.) on exogenous TRT (testosteronew cypionate) injections   Hypothyroidism, postsurgical    Iron  deficiency anemia    Obstructive sleep apnea on CPAP    Radiculopathy, lumbar region    Rate-related  LEFT bundle branch block  ST elevation myocardial infarction (STEMI) of anterior wall (HCC) 11/25/2020   a.) LHC/PCI: 40% OM2, 99% pLAD (3.0 x 26 mm Resolute Onyx DES), 50% mLAD, 30% pRCA   Thyroid  cancer  (HCC) 2009   a.) s/p total thyroidectomy   Vertigo    Vitamin D  deficiency     SURGICAL HISTORY: Past Surgical History:  Procedure Laterality Date   CATARACT EXTRACTION W/ INTRAOCULAR LENS  IMPLANT, BILATERAL     CERVICAL DISC ARTHROPLASTY N/A 05/01/2023   Procedure: C5-6 ARTHROPLASTY;  Surgeon: Clois Fret, MD;  Location: [facility] ORS;  Service: Neurosurgery;  Laterality: N/A;   CHOLECYSTECTOMY  04/09/1998   COLONOSCOPY     1998, 2010   COLONOSCOPY WITH ESOPHAGOGASTRODUODENOSCOPY (EGD)  05/29/2018   COLONOSCOPY WITH ESOPHAGOGASTRODUODENOSCOPY (EGD)  01/26/2022   CORONARY/GRAFT ACUTE MI REVASCULARIZATION N/A 06/16/2020   Procedure: Coronary/Graft Acute MI Revascularization;  Surgeon: Ammon Blunt, MD;  Location: [facility] INVASIVE CV LAB;  Service: Cardiovascular;  Laterality: N/A;   LEFT HEART CATH AND CORONARY ANGIOGRAPHY N/A 06/16/2020   Procedure: LEFT HEART CATH AND CORONARY ANGIOGRAPHY;  Surgeon: Ammon Blunt, MD;  Location: [facility] INVASIVE CV LAB;  Service: Cardiovascular;  Laterality: N/A;   LYMPH NODE DISSECTION  06/13/2007   NASAL SEPTUM SURGERY     RETINAL DETACHMENT SURGERY Left    TOTAL THYROIDECTOMY  06/13/2007    SOCIAL HISTORY: Social History   Socioeconomic History   Marital status: Married    Spouse name: Rilla   Number of children: 3   Years of education: Not on file   Highest education level: Not on file  Occupational History   Not on file  Tobacco Use   Smoking status: Never   Smokeless tobacco: Never  Vaping Use   Vaping status: Never Used  Substance and Sexual Activity   Alcohol use: Yes    Comment: weekly   Drug use: No   Sexual activity: Not on file  Other Topics Concern   Not on file  Social History Narrative   ** Merged History Encounter **       Social Drivers of Corporate investment banker Strain: Not on file  Food Insecurity: Not on file  Transportation Needs: Not on file  Physical Activity: Not on file  Stress:  Not on file  Social Connections: Not on file  Intimate Partner Violence: Not on file    FAMILY HISTORY: Family History  Problem Relation Age of Onset   Hyperlipidemia Father    Heart disease Father     ALLERGIES:  is allergic to halcion [triazolam].  MEDICATIONS:  Current Outpatient Medications  Medication Sig Dispense Refill   Alirocumab  (PRALUENT ) 150 MG/ML SOAJ Inject 1 mL (150 mg total) into the skin every 14 (fourteen) days. 6 mL 4   aspirin  81 MG chewable tablet Chew 1 tablet (81 mg total) by mouth daily. 30 tablet 2   atorvastatin  (LIPITOR ) 10 MG tablet Take 1 tablet (10 mg total) by mouth once daily 90 tablet 3   Cholecalciferol  (VITAMIN D ) 50 MCG (2000 UT) tablet Take 2,000 Units by mouth daily.     ciprofloxacin  (CIPRO ) 500 MG tablet Take 1 tablet (500 mg total) by mouth 2 (two) times daily as needed for severe diarrhea. 28 tablet 1   ketoconazole  (NIZORAL ) 2 % shampoo Apply 1 Application topically 3 (three) times a week. Wash scalp 3 times weekly, let sit 5 minutes and rinse out 360 mL 4   levothyroxine  (SYNTHROID ) 175 MCG tablet Take 175 mcg by mouth daily  before breakfast.     meclizine  (ANTIVERT ) 25 MG tablet Take 1 tablet (25 mg total) by mouth every 8 (eight) hours as needed for vertigo. 30 tablet 1   minoxidil  (LONITEN ) 2.5 MG tablet Take 2 tablets (5 mg total) by mouth daily. (Patient taking differently: Take 2.5 mg by mouth daily.) 180 tablet 4   Multiple Vitamins-Minerals (PRESERVISION AREDS 2) CAPS Take 1 capsule by mouth 2 (two) times daily.     promethazine  (PHENERGAN ) 25 MG suppository Place 1 suppository (25 mg total) rectally every 8 (eight) hours as needed for nausea/vomiting. 12 suppository 2   RESTASIS  0.05 % ophthalmic emulsion Place 1 drop into both eyes 2 (two) times daily. 180 each 3   RESTASIS  0.05 % ophthalmic emulsion Place 1 drop into both eyes 2 (two) times daily. 180 each 3   Ruxolitinib  Phosphate (OPZELURA ) 1.5 % CREA Apply 1 Application topically  daily to areas of vitiligo on body 180 g 4   scopolamine  (TRANSDERM-SCOP) 1 MG/3DAYS Place 1 patch (1.5 mg total) onto the skin behind the year every three (3) days as needed for vertigo, do not get ointment in the eyes. 10 patch 2   testosterone  cypionate (DEPOTESTOSTERONE CYPIONATE) 200 MG/ML injection Inject 1 mL (200 mg total) into the muscle every 14 (fourteen) days. 6 mL 0   tobramycin  (TOBREX ) 0.3 % ophthalmic solution Place 1 drop into both eyes 4 (four) times daily for 3 days 5 mL 4   valACYclovir  (VALTREX ) 1000 MG tablet Take 1 tablet (1,000 mg total) by mouth as directed. Take 2 tablets at onset of fever blister symptoms then 2 tablets 12 hours later 90 tablet 4   No current facility-administered medications for this visit.      PHYSICAL EXAMINATION:   Vitals:   12/20/23 1433  BP: (!) 148/78  Pulse: 88  Resp: 16  Temp: 98.2 F (36.8 C)  SpO2: 99%     There were no vitals filed for this visit.  Physical Exam Vitals and nursing note reviewed.  HENT:     Head: Normocephalic and atraumatic.     Mouth/Throat:     Pharynx: Oropharynx is clear.  Eyes:     Extraocular Movements: Extraocular movements intact.     Pupils: Pupils are equal, round, and reactive to light.  Cardiovascular:     Rate and Rhythm: Normal rate and regular rhythm.  Abdominal:     Palpations: Abdomen is soft.  Musculoskeletal:        General: Normal range of motion.     Cervical back: Normal range of motion.  Skin:    General: Skin is warm.  Neurological:     General: No focal deficit present.     Mental Status: He is alert and oriented to person, place, and time.  Psychiatric:        Behavior: Behavior normal.        Judgment: Judgment normal.    LABORATORY DATA:  I have reviewed the data as listed Lab Results  Component Value Date   WBC 5.0 12/16/2023   HGB 15.6 12/16/2023   HCT 45.8 12/16/2023   MCV 84.7 12/16/2023   PLT 189 12/16/2023   Recent Labs    01/25/23 0811  03/25/23 0830 06/10/23 0815 08/12/23 0820 12/16/23 0815  NA 141   < > 138 138 139  K 4.0   < > 4.0 4.1 4.3  CL 104   < > 102 103 104  CO2 30   < > 27  26 26  GLUCOSE 100*   < > 107* 106* 114*  BUN 19   < > 20 20 19   CREATININE 1.17   < > 1.20 1.25* 1.29*  CALCIUM  8.8   < > 8.7* 8.6* 8.4*  GFRNONAA  --    < > >60 >60 >60  PROT 6.7  --   --  7.2  --   ALBUMIN 4.3  --   --  4.3  --   AST 16  --   --  20  --   ALT 18  --   --  17  --   ALKPHOS 68  --   --  70  --   BILITOT 0.6  --   --  0.6  --   BILIDIR 0.1  --   --   --   --    < > = values in this interval not displayed.     No results found.  Iron  deficiency # Anemia- iron  deficiency-likely secondary to occult GI bleed [see below].  S/p Venofer .  # Proceed with Venofer -today- continue compliance with  gentle iron  and  barimelt as maintenance.  #Etiology: Iron  deficiency. unclear likely occult/ secondary to chronic GI bleeding/AVMs-s/p capsule study/ double balloon enteroscopy/capsule [April/MAY 2023]-do not suspect any bone marrow/PNH.  Status post referral to [facility]- EGD/[facility]- OCT-2023- WNL- stable.   # CAD - [off Brillinta- dec 2022; on asprin 81 mg/day]- stable.   # DISPOSITION: Fridays #  Venofer  today # in 3 months-  2-3 days prior-labs- iron  studies;ferritin-possible venofer  - # follow up in 6 months- MD; 2-3 days labs- cbc/bmp; iron  studies/ferritin-possible venofer - Dr.B     All questions were answered. The patient knows to call the clinic with any problems, questions or concerns.   Cindy JONELLE Joe, MD 12/20/2023 3:29 PM

## 2023-12-27 ENCOUNTER — Other Ambulatory Visit: Payer: Self-pay

## 2024-01-07 ENCOUNTER — Other Ambulatory Visit: Payer: Self-pay

## 2024-01-07 DIAGNOSIS — R7989 Other specified abnormal findings of blood chemistry: Secondary | ICD-10-CM

## 2024-01-08 ENCOUNTER — Other Ambulatory Visit: Payer: Self-pay

## 2024-01-08 DIAGNOSIS — M4802 Spinal stenosis, cervical region: Secondary | ICD-10-CM

## 2024-01-08 DIAGNOSIS — M5412 Radiculopathy, cervical region: Secondary | ICD-10-CM

## 2024-01-08 NOTE — Telephone Encounter (Signed)
 Xray order placed.

## 2024-01-22 ENCOUNTER — Encounter: Payer: Self-pay | Admitting: Sleep Medicine

## 2024-01-22 DIAGNOSIS — G4733 Obstructive sleep apnea (adult) (pediatric): Secondary | ICD-10-CM

## 2024-01-23 ENCOUNTER — Ambulatory Visit: Admitting: Neurosurgery

## 2024-01-27 ENCOUNTER — Other Ambulatory Visit: Payer: Self-pay

## 2024-01-27 DIAGNOSIS — R7989 Other specified abnormal findings of blood chemistry: Secondary | ICD-10-CM

## 2024-01-28 ENCOUNTER — Ambulatory Visit

## 2024-01-28 ENCOUNTER — Ambulatory Visit (INDEPENDENT_AMBULATORY_CARE_PROVIDER_SITE_OTHER): Admitting: Neurosurgery

## 2024-01-28 VITALS — BP 136/82 | Wt 203.8 lb

## 2024-01-28 DIAGNOSIS — M4802 Spinal stenosis, cervical region: Secondary | ICD-10-CM

## 2024-01-28 DIAGNOSIS — M2578 Osteophyte, vertebrae: Secondary | ICD-10-CM | POA: Diagnosis not present

## 2024-01-28 DIAGNOSIS — M545 Low back pain, unspecified: Secondary | ICD-10-CM

## 2024-01-28 DIAGNOSIS — G8929 Other chronic pain: Secondary | ICD-10-CM

## 2024-01-28 DIAGNOSIS — M5412 Radiculopathy, cervical region: Secondary | ICD-10-CM

## 2024-01-28 DIAGNOSIS — Z09 Encounter for follow-up examination after completed treatment for conditions other than malignant neoplasm: Secondary | ICD-10-CM | POA: Diagnosis not present

## 2024-01-28 DIAGNOSIS — Z471 Aftercare following joint replacement surgery: Secondary | ICD-10-CM | POA: Diagnosis not present

## 2024-01-28 LAB — HEMOGLOBIN AND HEMATOCRIT, BLOOD
Hematocrit: 46.3 % (ref 37.5–51.0)
Hemoglobin: 15.2 g/dL (ref 13.0–17.7)

## 2024-01-28 LAB — TESTOSTERONE: Testosterone: 537 ng/dL (ref 264–916)

## 2024-01-28 LAB — PSA: Prostate Specific Ag, Serum: 0.8 ng/mL (ref 0.0–4.0)

## 2024-01-28 NOTE — Progress Notes (Signed)
   REFERRING PHYSICIAN:  Glendia Shad, Md 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  Overall, he has improved compared to before surgery.  Last week, he had some difficulties with a tough day in clinic that caused some neck pain as well as some discomfort into his trapezius muscles bilaterally.  He has had occasional numbness in his 1st and 2nd finger with heavy work days, but no tremors.  He is still limiting his practice, and is no longer doing the larger procedures he previously did.  He has been limiting himself to biopsies and short procedures taking 1 minute or less.  Even with this, he gets some discomfort.  He reports that he was working with his trainer and doing some twisting motions which caused some lower back discomfort.  He has not tried any NSAIDs for that.   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 He is doing fairly well despite continued tremor and neck discomfort. He has made modifications to his work environment that are working well for him.   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   He is doing very well.  His neck and radicular pain is much improved.  He still having some numbness in his hands in his first and second finger.  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  He has been able to return to practice, but is not performing his full scope of duties.  His symptoms initially began after a workout injury in September 2024.   PHYSICAL EXAMINATION:  NEUROLOGICAL:  General: In no acute distress.   Awake, alert, oriented to person, place, and time.  Pupils equal round and  reactive to light.   Strength: Side Biceps Triceps Deltoid Interossei Grip Wrist Ext. Wrist Flex.  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  Though he has some limitations, he is doing much better than he was prior to surgery.  I am very pleased with his improvements.  I am hopeful that he will show some continued slow improvements over the coming months.  For his lower back, we will start some exercises focusing on core strength.  He will intermittently take NSAIDs as needed.  I will check back in with him in approximately 4 weeks to see how he is doing.  If he is not improved at that time, we will pursue some imaging and discuss formal physical therapy.  [facility]

## 2024-01-31 ENCOUNTER — Other Ambulatory Visit: Payer: 59

## 2024-02-06 ENCOUNTER — Ambulatory Visit: Admitting: Dermatology

## 2024-02-06 ENCOUNTER — Other Ambulatory Visit: Payer: Self-pay

## 2024-02-06 DIAGNOSIS — L738 Other specified follicular disorders: Secondary | ICD-10-CM

## 2024-02-06 DIAGNOSIS — B009 Herpesviral infection, unspecified: Secondary | ICD-10-CM

## 2024-02-06 DIAGNOSIS — L700 Acne vulgaris: Secondary | ICD-10-CM | POA: Diagnosis not present

## 2024-02-06 DIAGNOSIS — L80 Vitiligo: Secondary | ICD-10-CM

## 2024-02-06 DIAGNOSIS — L649 Androgenic alopecia, unspecified: Secondary | ICD-10-CM | POA: Diagnosis not present

## 2024-02-06 DIAGNOSIS — B354 Tinea corporis: Secondary | ICD-10-CM

## 2024-02-06 DIAGNOSIS — Z79899 Other long term (current) drug therapy: Secondary | ICD-10-CM

## 2024-02-06 DIAGNOSIS — Z86018 Personal history of other benign neoplasm: Secondary | ICD-10-CM

## 2024-02-06 DIAGNOSIS — L989 Disorder of the skin and subcutaneous tissue, unspecified: Secondary | ICD-10-CM | POA: Diagnosis not present

## 2024-02-06 DIAGNOSIS — B1089 Other human herpesvirus infection: Secondary | ICD-10-CM

## 2024-02-06 DIAGNOSIS — L821 Other seborrheic keratosis: Secondary | ICD-10-CM | POA: Diagnosis not present

## 2024-02-06 DIAGNOSIS — Z7189 Other specified counseling: Secondary | ICD-10-CM

## 2024-02-06 DIAGNOSIS — L820 Inflamed seborrheic keratosis: Secondary | ICD-10-CM | POA: Diagnosis not present

## 2024-02-06 MED ORDER — MINOXIDIL 2.5 MG PO TABS
2.5000 mg | ORAL_TABLET | Freq: Two times a day (BID) | ORAL | 3 refills | Status: AC
  Filled 2024-02-06: qty 180, 90d supply, fill #0
  Filled 2024-05-02: qty 180, 90d supply, fill #1

## 2024-02-06 MED ORDER — TERBINAFINE HCL 250 MG PO TABS
250.0000 mg | ORAL_TABLET | Freq: Every day | ORAL | 1 refills | Status: AC
  Filled 2024-02-06: qty 14, 14d supply, fill #0

## 2024-02-06 MED ORDER — OPZELURA 1.5 % EX CREA
1.0000 | TOPICAL_CREAM | Freq: Every day | CUTANEOUS | 4 refills | Status: AC
  Filled 2024-02-06 – 2024-03-03 (×2): qty 180, 90d supply, fill #0

## 2024-02-06 MED ORDER — VALACYCLOVIR HCL 1 G PO TABS
1000.0000 mg | ORAL_TABLET | ORAL | 4 refills | Status: AC
  Filled 2024-02-06: qty 90, 90d supply, fill #0
  Filled 2024-05-13: qty 90, 90d supply, fill #1

## 2024-02-06 MED ORDER — KETOCONAZOLE 2 % EX CREA
1.0000 | TOPICAL_CREAM | Freq: Two times a day (BID) | CUTANEOUS | 2 refills | Status: AC
  Filled 2024-02-06: qty 60, 30d supply, fill #0
  Filled 2024-03-03: qty 60, 30d supply, fill #1
  Filled 2024-04-04 (×3): qty 60, 30d supply, fill #2

## 2024-02-06 MED ORDER — ISOTRETINOIN 20 MG PO CAPS
20.0000 mg | ORAL_CAPSULE | Freq: Two times a day (BID) | ORAL | 0 refills | Status: DC
  Filled 2024-02-06: qty 60, 30d supply, fill #0

## 2024-02-06 NOTE — Telephone Encounter (Signed)
 I have received a message from Matthew Kelley with [employer]   They reached out on 10/24 and line was busy. They reached out on 10/27 and line was busy. A text was sent to the patient by phone to follow up.  I have asked the branch to follow up again.

## 2024-02-06 NOTE — Patient Instructions (Signed)

## 2024-02-06 NOTE — Telephone Encounter (Signed)
 I have sent urgent request to [employer] to check on this issue

## 2024-02-06 NOTE — Progress Notes (Addendum)
 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 Vitiligo, acne,cold sores and hair loss. Needs rfs of meds.  Also new rash on back of thigh to check.   The following portions of the chart were reviewed this encounter and updated as appropriate: medications, allergies, medical history  Review of Systems:  No other skin or systemic complaints except as noted in HPI or Assessment and Plan.  Objective  Well appearing patient in no apparent distress; mood and affect are within normal limits.    A focused examination was performed of the following areas: Back, face, leg  Relevant exam findings are noted in the Assessment and Plan.  R axilla x 3, back x 16, R cheek x 1 (20) Erythematous stuck-on, waxy papule or plaque  Assessment & Plan   ACNE VULGARIS with SEBACEOUS HYPERPLASIA Face and back Exam: Small yellow papules with a central dell. Scattered comedones    Chronic and persistent condition with duration or expected duration over one year. Condition is symptomatic/ bothersome to patient. Not currently at goal.    Treatment Plan: Baseline lab reviewed from 5/25, LFTS/Lipids wnl Pt re-enrolled in [facility] #  [phone] Re Start Isotretinoin  20mg  1 po bid with fatty meal   While taking isotretinoin , do not share pills and do not donate blood. Generic isotretinoin  is best absorbed when taken with a fatty meal. Isotretinoin  can make you sensitive to the sun. Daily careful sun protection including sunscreen SPF 30+ when outdoors is recommended.   SEBORRHEIC KERATOSIS - Stuck-on, waxy, tan-brown papules back - Benign-appearing - Discussed benign etiology and prognosis. - Observe - Call for any changes  VITILIGO Bil wrist, post neck, bil axilla  Exam: depigmented patches on bil wrist, post neck, bil axilla   Chronic and persistent condition with duration or expected duration  over one year. Condition is improving with treatment but not currently at goal.    Vitiligo is a chronic autoimmune condition which causes loss of skin pigment and is commonly seen on the face and may also involve areas of trauma like hands, elbows, knees, and ankles. There is no cure and it is difficult to treat.  Treatments include topical steroids and other topical anti-inflammatory ointments/creams and topical and oral Jak inhibitors.  Sometimes narrow band UV light therapy or Xtrac laser is helpful, both of which require twice weekly treatments for at least 3-6 months.  Antioxidant vitamins, such as Vitamins A,C,E,D, Folic Acid and B12 may be added to enhance treatment. Heliocare may also enhance treatment results.   Treatment Plan: Cont Opzelura  cream bid to white patches  ANDROGENETIC ALOPECIA (MALE PATTERN HAIR LOSS) Exam: Frontal scalp thinning with intact frontal hairline and miniaturization   Chronic and persistent condition with duration or expected duration over one year. Condition is improving with treatment but not currently at goal.   Androgenetic Alopecia (or Male pattern hair loss) refers to the common patterned hair loss affecting many men.  Male pattern alopecia is mediated by dihydrotestosterone which induces miniaturization of androgen-sensitive hair follicles.  It is chronic and persistent, but treatable; not curable. Topical treatment includes: - 5% topical Minoxidil  Oral treatment includes: - Finasteride  1 mg qd - Minoxidil  1.25 - 5 mg qd - Dutasteride 0.5 mg qd Adjunct therapy includes: - Low Level Laser Light Therapy (LLLT) - Platelet-rich Plasma injections (PRP) - Hair Transplantation or scalp reduction  Treatment Plan: Denies side effects  on minoxidil  Continue minoxidil  2.5 mg bid # 180 3 RF  Doses of oral minoxidil  for hair loss are considered 'low dose'. This is because the doses used for hair loss are much lower than the doses which are used for conditions  such as high blood pressure (hypertension). The doses used for hypertension are 10-40mg  per day.  Side effects are uncommon at the low doses (up to 2.5 mg/day) used to treat hair loss. Potential side effects, more commonly seen at higher doses, include: Increase in hair growth (hypertrichosis) elsewhere on face and body Temporary hair shedding upon starting medication which may last up to 4 weeks Ankle swelling, fluid retention, rapid weight gain more than 5 pounds Low blood pressure and feeling lightheaded or dizzy when standing up quickly Fast or irregular heartbeat Headaches   Long term medication management.  Patient is using long term (months to years) prescription medication  to control their dermatologic condition.  These medications require periodic monitoring to evaluate for efficacy and side effects and may require periodic laboratory monitoring.   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  Treatment Plan: Fungal ID PCR test performed today Start ketoconazole  2 % cream twice daily x 2-4 weeks. Start terbinafine 250 mg PO every day x 2 weeks  Terbinafine Counseling  Terbinafine is an anti-fungal medicine that can be applied to the skin (over the counter) or taken by mouth (prescription) to treat fungal infections. The pill version is often used to treat fungal infections of the nails or scalp. While most people do not have any side effects from taking terbinafine pills, some possible side effects of the medicine can include taste changes, headache, loss of smell, vision changes, nausea, vomiting, or diarrhea.   Rare side effects can include irritation of the liver, allergic reaction, or decrease in blood counts (which may show up as not feeling well or developing an infection). If you are concerned about any of these side effects, please stop the medicine and call your doctor, or in the case of an emergency  such as feeling very unwell, seek immediate medical care.    INFLAMED SEBORRHEIC KERATOSIS (20) R axilla x 3, back x 16, R cheek x 1 (20) Symptomatic, irritating, patient would like treated.  Benign-appearing.  Call clinic for new or changing lesions.   Destruction of lesion - R axilla x 3, back x 16, R cheek x 1 (20)  Destruction method: cryotherapy   Informed consent: discussed and consent obtained   Lesion destroyed using liquid nitrogen: Yes   Region frozen until ice ball extended beyond lesion: Yes   Outcome: patient tolerated procedure well with no complications   Post-procedure details: wound care instructions given   Additional details:  Prior to procedure, discussed risks of blister formation, small wound, skin dyspigmentation, or rare scar following cryotherapy. Recommend Vaseline ointment to treated areas while healing.    HISTORY OF DYSPLASTIC NEVUS No evidence of recurrence today- L scapula lateral, spinal lower back, L paraspinal lower back, R lower back, R mid back Recommend regular full body skin exams Recommend daily broad spectrum sunscreen SPF 30+ to sun-exposed areas, reapply every 2 hours as needed.  Call if any new or changing lesions are noted between office visits  HERPESVIRAL INFECTION (COLD SORES) Exam: Clear today at lips   Chronic condition with duration or expected duration over one year. Currently well-controlled.   Herpes Simplex Virus = Cold Sores =  Fever Blisters is a chronic recurring blistering; scabbing sore-producing viral infection that is recurrent usually in the same area triggered by stress, sun/UV exposure and trauma.  It is infectious and can be spread from person to person by direct contact.  It is not curable, but is treatable with topical and oral medication.   Treatment Plan Take Valacyclovir  2 grams every 12 hours for 2 doses with a glass of water at the first sign of symptoms.  Return in about 30 days (around 03/07/2024) for TBSE,  Isotretinoin , with Dr. Jackquline.  LILLETTE Lonell Drones, RMA, am acting as scribe for Rexene Jackquline, MD .   Documentation: I have reviewed the above documentation for accuracy and completeness, and I agree with the above.  Rexene Jackquline, MD

## 2024-02-07 ENCOUNTER — Other Ambulatory Visit: Payer: Self-pay

## 2024-02-07 ENCOUNTER — Ambulatory Visit: Payer: Self-pay | Admitting: Urology

## 2024-02-07 ENCOUNTER — Encounter: Payer: Self-pay | Admitting: Urology

## 2024-02-07 ENCOUNTER — Telehealth: Payer: Self-pay

## 2024-02-07 ENCOUNTER — Encounter: Payer: Self-pay | Admitting: Internal Medicine

## 2024-02-07 ENCOUNTER — Ambulatory Visit: Admitting: Internal Medicine

## 2024-02-07 VITALS — BP 137/88 | Ht 73.0 in | Wt 200.0 lb

## 2024-02-07 VITALS — BP 128/80 | HR 83 | Temp 97.8°F | Ht 73.0 in | Wt 200.5 lb

## 2024-02-07 DIAGNOSIS — E7800 Pure hypercholesterolemia, unspecified: Secondary | ICD-10-CM

## 2024-02-07 DIAGNOSIS — R5383 Other fatigue: Secondary | ICD-10-CM | POA: Diagnosis not present

## 2024-02-07 DIAGNOSIS — K921 Melena: Secondary | ICD-10-CM | POA: Diagnosis not present

## 2024-02-07 DIAGNOSIS — M5442 Lumbago with sciatica, left side: Secondary | ICD-10-CM

## 2024-02-07 DIAGNOSIS — G4733 Obstructive sleep apnea (adult) (pediatric): Secondary | ICD-10-CM

## 2024-02-07 DIAGNOSIS — R7989 Other specified abnormal findings of blood chemistry: Secondary | ICD-10-CM | POA: Diagnosis not present

## 2024-02-07 DIAGNOSIS — D509 Iron deficiency anemia, unspecified: Secondary | ICD-10-CM | POA: Diagnosis not present

## 2024-02-07 DIAGNOSIS — E291 Testicular hypofunction: Secondary | ICD-10-CM | POA: Diagnosis not present

## 2024-02-07 DIAGNOSIS — Z85850 Personal history of malignant neoplasm of thyroid: Secondary | ICD-10-CM

## 2024-02-07 DIAGNOSIS — N5201 Erectile dysfunction due to arterial insufficiency: Secondary | ICD-10-CM | POA: Diagnosis not present

## 2024-02-07 LAB — LIPID PANEL
Cholesterol: 97 mg/dL (ref 0–200)
HDL: 39.4 mg/dL (ref >39.00)
LDL Cholesterol: 42 mg/dL (ref 0–99)
NonHDL: 57.37
Total CHOL/HDL Ratio: 2
Triglycerides: 79 mg/dL (ref 0.0–149.0)
VLDL: 15.8 mg/dL (ref 0.0–40.0)

## 2024-02-07 LAB — TSH: TSH: 0.69 u[IU]/mL (ref 0.35–5.50)

## 2024-02-07 LAB — CBC WITH DIFFERENTIAL/PLATELET
Basophils Absolute: 0 K/uL (ref 0.0–0.1)
Basophils Relative: 0.8 % (ref 0.0–3.0)
Eosinophils Absolute: 0.2 K/uL (ref 0.0–0.7)
Eosinophils Relative: 3.6 % (ref 0.0–5.0)
HCT: 41 % (ref 39.0–52.0)
Hemoglobin: 14 g/dL (ref 13.0–17.0)
Lymphocytes Relative: 33.2 % (ref 12.0–46.0)
Lymphs Abs: 1.5 K/uL (ref 0.7–4.0)
MCHC: 34.1 g/dL (ref 30.0–36.0)
MCV: 86.4 fl (ref 78.0–100.0)
Monocytes Absolute: 0.5 K/uL (ref 0.1–1.0)
Monocytes Relative: 9.9 % (ref 3.0–12.0)
Neutro Abs: 2.4 K/uL (ref 1.4–7.7)
Neutrophils Relative %: 52.5 % (ref 43.0–77.0)
Platelets: 204 K/uL (ref 150.0–400.0)
RBC: 4.75 Mil/uL (ref 4.22–5.81)
RDW: 14.5 % (ref 11.5–15.5)
WBC: 4.6 K/uL (ref 4.0–10.5)

## 2024-02-07 LAB — BASIC METABOLIC PANEL WITH GFR
BUN: 19 mg/dL (ref 6–23)
CO2: 29 meq/L (ref 19–32)
Calcium: 8.5 mg/dL (ref 8.4–10.5)
Chloride: 105 meq/L (ref 96–112)
Creatinine, Ser: 1.21 mg/dL (ref 0.40–1.50)
GFR: 62.88 mL/min (ref >60.00)
Glucose, Bld: 97 mg/dL (ref 70–99)
Potassium: 4 meq/L (ref 3.5–5.1)
Sodium: 140 meq/L (ref 135–145)

## 2024-02-07 LAB — VITAMIN D 25 HYDROXY (VIT D DEFICIENCY, FRACTURES): VITD: 48.92 ng/mL (ref 30.00–100.00)

## 2024-02-07 LAB — HEPATIC FUNCTION PANEL
ALT: 15 U/L (ref 0–53)
AST: 13 U/L (ref 0–37)
Albumin: 4.4 g/dL (ref 3.5–5.2)
Alkaline Phosphatase: 66 U/L (ref 39–117)
Bilirubin, Direct: 0.2 mg/dL (ref 0.0–0.3)
Total Bilirubin: 1 mg/dL (ref 0.2–1.2)
Total Protein: 6.6 g/dL (ref 6.0–8.3)

## 2024-02-07 LAB — FERRITIN: Ferritin: 97.5 ng/mL (ref 22.0–322.0)

## 2024-02-07 LAB — VITAMIN B12: Vitamin B-12: 1236 pg/mL — ABNORMAL HIGH (ref 211–911)

## 2024-02-07 MED ORDER — TADALAFIL 20 MG PO TABS
20.0000 mg | ORAL_TABLET | ORAL | 0 refills | Status: DC
  Filled 2024-02-07 (×2): qty 10, 10d supply, fill #0

## 2024-02-07 NOTE — Progress Notes (Signed)
 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 [place],  [place] [address]  Chief Complaint  Patient presents with   Hypogonadism   Urologic history:  1.  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  Overall significant improvement in his symptoms on TRT however has recently noted some increased fatigue Has sleep apnea and is on CPAP however states he is in the process of being scheduled for O2 monitoring while on CPAP Labs 01/27/2024: Testosterone  537 ng/dL, PSA 0.8, H/H 84.7/53.6 Has noted some difficulty maintaining an erection over the last 6-8 months Had previously tried sildenafil  several years ago and he recently tried at a 60 mg dose and states it was effective  PMH: Past Medical History:  Diagnosis Date   Aortic atherosclerosis    Atypical nevus 09/07/2008   Left scapula, lateral. Moderate atypia.   Blood in stool    Coronary artery disease involving native coronary artery of native heart without angina pectoris 06/16/2020   a.) anterior STEMI 06/16/2020 --> LHC/PCI: 40% OM2, 99% pLAD (3.0 x 26 mm Resolute Onyx DES), 50% mLAD, 30% pRCA; b.) cMRI 11/25/2020: EF 62%, subendocardial infarction (< 25% transmural) involving m-d anteroseptal wall involving the true apex and distal inf wall   DDD (degenerative disc disease), cervical    Detached retina, left 2010   Diastolic dysfunction    a.) TTE 09/08/2020: EF >55%, no RWMAs, G1DD, norm RVSF, triv MR/TR/PR; b.) stress TTE 03/15/2023: LBBB with peak stress, max workload 13.4 METS, dias function/doppler for regurg/stenosis not performed   Dysplastic nevus 12/20/2009   Spinal lower back. Moderate atypia, edges free   Dysplastic nevus 12/20/2009   Left paraspinal lower back. Mild atypia,  edges free.   Dysplastic nevus 11/24/2018   Right lower back. Mild atypia, deep margin involved.   Dysplastic nevus 11/24/2018   Right mid back. Moderate atypia and halo nevus effect. Limited margins free.   GI bleed    Herniation of cervical intervertebral disc with radiculopathy 12/2022   a.) secondary to exercise/regimen with personal trainer   History of chicken pox    Hypercholesterolemia    Hyperlipidemia, mixed    Hypogonadism in male    a.) on exogenous TRT (testosteronew cypionate) injections   Hypothyroidism, postsurgical    Iron  deficiency anemia    Obstructive sleep apnea on CPAP    Radiculopathy, lumbar region    Rate-related  LEFT bundle branch block    ST elevation myocardial infarction (STEMI) of anterior wall (HCC) 11/25/2020   a.) LHC/PCI: 40% OM2, 99% pLAD (3.0 x 26 mm Resolute Onyx DES), 50% mLAD, 30% pRCA   Thyroid  cancer (HCC) 2009   a.) s/p total thyroidectomy   Vertigo    Vitamin D  deficiency     Surgical History: Past Surgical History:  Procedure Laterality Date   CATARACT EXTRACTION W/ INTRAOCULAR LENS  IMPLANT, BILATERAL     CERVICAL DISC ARTHROPLASTY N/A 05/01/2023   Procedure: C5-6 ARTHROPLASTY;  Surgeon: Clois Fret, MD;  Location: [facility];  Service: Neurosurgery;  Laterality: N/A;   CHOLECYSTECTOMY  04/09/1998   COLONOSCOPY     1998, 2010   COLONOSCOPY WITH ESOPHAGOGASTRODUODENOSCOPY (EGD)  05/29/2018   COLONOSCOPY WITH ESOPHAGOGASTRODUODENOSCOPY (EGD)  01/26/2022   CORONARY/GRAFT ACUTE MI REVASCULARIZATION N/A 06/16/2020   Procedure: Coronary/Graft Acute MI Revascularization;  Surgeon:  Ammon Blunt, MD;  Location: [facility];  Service: Cardiovascular;  Laterality: N/A;   LEFT HEART CATH AND CORONARY ANGIOGRAPHY N/A 06/16/2020   Procedure: LEFT HEART CATH AND CORONARY ANGIOGRAPHY;  Surgeon: Ammon Blunt, MD;  Location: [facility];  Service: Cardiovascular;  Laterality: N/A;   LYMPH NODE DISSECTION   06/13/2007   NASAL SEPTUM SURGERY     RETINAL DETACHMENT SURGERY Left    TOTAL THYROIDECTOMY  06/13/2007    Home Medications:  Allergies as of 02/07/2024       Reactions   Halcion [triazolam] Other (See Comments)   Moody,angry        Medication List        Accurate as of February 07, 2024  3:29 PM. If you have any questions, ask your nurse or doctor.          STOP taking these medications    Amnesteem  20 MG capsule Generic drug: ISOtretinoin  Stopped by: Glendia JAYSON Barba       TAKE these medications    aspirin  81 MG chewable tablet Chew 1 tablet (81 mg total) by mouth daily.   atorvastatin  10 MG tablet Commonly known as: LIPITOR  Take 1 tablet (10 mg total) by mouth once daily What changed: additional instructions   ciprofloxacin  500 MG tablet Commonly known as: Cipro  Take 1 tablet (500 mg total) by mouth 2 (two) times daily as needed for severe diarrhea.   ketoconazole  2 % shampoo Commonly known as: NIZORAL  Apply 1 Application topically 3 (three) times a week. Wash scalp 3 times weekly, let sit 5 minutes and rinse out   ketoconazole  2 % cream Commonly known as: NIZORAL  Apply 1 Application topically 2 (two) times daily.   levothyroxine  175 MCG tablet Commonly known as: SYNTHROID  Take 175 mcg by mouth daily before breakfast.   meclizine  25 MG tablet Commonly known as: ANTIVERT  Take 1 tablet (25 mg total) by mouth every 8 (eight) hours as needed for vertigo.   minoxidil  2.5 MG tablet Commonly known as: LONITEN  Take 1 tablet (2.5 mg total) by mouth 2 (two) times daily.   Opzelura  1.5 % Crea Generic drug: Ruxolitinib  Phosphate Apply 1 Application topically daily to areas of vitiligo on body   Praluent  150 MG/ML Soaj Generic drug: Alirocumab  Inject 1 mL (150 mg total) into the skin every 14 (fourteen) days.   PreserVision AREDS 2 Caps Take 1 capsule by mouth 2 (two) times daily.   promethazine  25 MG suppository Commonly known as:  PHENERGAN  Place 1 suppository (25 mg total) rectally every 8 (eight) hours as needed for nausea/vomiting.   Restasis  0.05 % ophthalmic emulsion Generic drug: cycloSPORINE  Place 1 drop into both eyes 2 (two) times daily.   Restasis  0.05 % ophthalmic emulsion Generic drug: cycloSPORINE  Place 1 drop into both eyes 2 (two) times daily.   scopolamine  1 MG/3DAYS Commonly known as: Transderm-Scop Place 1 patch (1.5 mg total) onto the skin behind the year every three (3) days as needed for vertigo, do not get ointment in the eyes.   tadalafil 20 MG tablet Commonly known as: CIALIS Take 1 tablet (20 mg total) by mouth as directed 1 hour prior to intercourse. Started by: Glendia JAYSON Barba   terbinafine 250 MG tablet Commonly known as: LAMISIL Take 1 tablet (250 mg total) by mouth daily for 14 days. Take 250 mg daily for one month   testosterone  cypionate 200 MG/ML injection Commonly known as: DEPOTESTOSTERONE CYPIONATE Inject 1 mL (200 mg total) into the  muscle every 14 (fourteen) days.   tobramycin  0.3 % ophthalmic solution Commonly known as: TOBREX  Place 1 drop into both eyes 4 (four) times daily for 3 days   valACYclovir  1000 MG tablet Commonly known as: VALTREX  Take 1 tablet (1,000 mg total) by mouth as directed. Take 2 tablets at onset of fever blister symptoms then 2 tablets 12 hours later   Vitamin D  50 MCG (2000 UT) tablet Take 2,000 Units by mouth daily.        Allergies:  Allergies  Allergen Reactions   Halcion [Triazolam] Other (See Comments)    Moody,angry    Family History: Family History  Problem Relation Age of Onset   Hyperlipidemia Father    Heart disease Father     Social History:  reports that he has never smoked. He has never used smokeless tobacco. He reports current alcohol use. He reports that he does not use drugs.   Physical Exam: BP 137/88   Ht 6' 1 (1.854 m)   Wt 200 lb (90.7 kg)   BMI 26.39 kg/m   Constitutional:  Alert, No acute  distress. HEENT: [place] AT Respiratory: Normal respiratory effort, no increased work of breathing. Psychiatric: Normal mood and affect.   Assessment & Plan:    1.  Hypogonadism Overall symptoms have significantly improved though recently with some increased fatigue He inquired about increasing the dose and we could push to 300 mg every 2 weeks.  We discussed administering a lower dose weekly which he states he is willing to do though the IM injections are uncomfortable Discussed a trial of [place] which may not be cost prohibitive with commercial insurance and discount a co-pay card.  He was interested in pursuing an Rx sent to [facility] If he starts [place] or increases his dose of testosterone  cypionate we will check a mid-end cycle testosterone  level 6 weeks after changing  2.  Erectile dysfunction Some difficulty maintaining an erection over the last 6-8 months We discussed differences in sildenafil  versus tadalafil including half-life and absorption.  He was interested in a trial of tadalafil and Rx 20 mg sent to pharmacy.  If he feels the sildenafil  is more effective he will call back for an Rx   Glendia JAYSON Barba, MD  [facility] [address] [place], [place] [address] [phone]

## 2024-02-07 NOTE — Telephone Encounter (Signed)
 Please notify him that I forgot to give him the cards. Can he come by and pick up. Sorry for the inconvenience.

## 2024-02-07 NOTE — Telephone Encounter (Signed)
 Lvm okay to relay. Pt can come by to pick up kit, kit can be returned to our office.

## 2024-02-07 NOTE — Telephone Encounter (Signed)
 Copied from CRM [id number]. Topic: Clinical - Medical Advice >> Feb 07, 2024  3:28 PM Anairis L wrote: Reason for CRM: Patient is calling in because during visit Dr.Scott informed him he could do a blood stool cards to detect blood. H want to know where how how does he get a hold of those cards.  Thank you.

## 2024-02-07 NOTE — Progress Notes (Signed)
 Subjective:    Patient ID: Matthew Rhyme, MD, male    DOB: June 05, 1960, 63 y.o.   MRN: [id number]  Patient here for  Chief Complaint  Patient presents with   Medical Management of Chronic Issues    6 mth f/u    HPI Here for a scheduled follow up - f/u regarding CAD, hypercholesterolemia and recent neck surgery. Is s/p cervical arthroplasty. Had f/u with pulmonary 09/19/23- f/u OSA. Continues on cpap. Discussed inspire therapy. Had f/u with cardiology 10/31/23 - f/u CAD. Stress echo - no ischemia 03/2023. Continues on praluent . Lipitor  10mg  added.  Taking three days per week. Recommended EKG stress test/ echo stress test. Scheduled for 03/2024. F/u with Dr Rennie 12/20/23 - f/u IDA. Received venofer . Continue gentle iron . Follow up with [facility] - 01/28/24 - doing better. Some low back discomfort. Recommended exercises - core strengthening. Has noticed over the last few months - fatigue. Still exercising. No chest pain or sob with increased activity or exertion. No nausea or vomiting. Has noticed some black stool recently. No abdominal pain. No acid reflux. Recently evaluated by Dr Nichole - thyroid  ok.    Past Medical History:  Diagnosis Date   Aortic atherosclerosis    Atypical nevus 09/07/2008   Left scapula, lateral. Moderate atypia.   Blood in stool    Coronary artery disease involving native coronary artery of native heart without angina pectoris 06/16/2020   a.) anterior STEMI 06/16/2020 --> LHC/PCI: 40% OM2, 99% pLAD (3.0 x 26 mm Resolute Onyx DES), 50% mLAD, 30% pRCA; b.) cMRI 11/25/2020: EF 62%, subendocardial infarction (< 25% transmural) involving m-d anteroseptal wall involving the true apex and distal inf wall   DDD (degenerative disc disease), cervical    Detached retina, left 2010   Diastolic dysfunction    a.) TTE 09/08/2020: EF >55%, no RWMAs, G1DD, norm RVSF, triv MR/TR/PR; b.) stress TTE 03/15/2023: LBBB with peak stress, max workload 13.4 METS, dias function/doppler for  regurg/stenosis not performed   Dysplastic nevus 12/20/2009   Spinal lower back. Moderate atypia, edges free   Dysplastic nevus 12/20/2009   Left paraspinal lower back. Mild atypia, edges free.   Dysplastic nevus 11/24/2018   Right lower back. Mild atypia, deep margin involved.   Dysplastic nevus 11/24/2018   Right mid back. Moderate atypia and halo nevus effect. Limited margins free.   GI bleed    Herniation of cervical intervertebral disc with radiculopathy 12/2022   a.) secondary to exercise/regimen with personal trainer   History of chicken pox    Hypercholesterolemia    Hyperlipidemia, mixed    Hypogonadism in male    a.) on exogenous TRT (testosteronew cypionate) injections   Hypothyroidism, postsurgical    Iron  deficiency anemia    Obstructive sleep apnea on CPAP    Radiculopathy, lumbar region    Rate-related  LEFT bundle branch block    ST elevation myocardial infarction (STEMI) of anterior wall (HCC) 11/25/2020   a.) LHC/PCI: 40% OM2, 99% pLAD (3.0 x 26 mm Resolute Onyx DES), 50% mLAD, 30% pRCA   Thyroid  cancer (HCC) 2009   a.) s/p total thyroidectomy   Vertigo    Vitamin D  deficiency    Past Surgical History:  Procedure Laterality Date   CATARACT EXTRACTION W/ INTRAOCULAR LENS  IMPLANT, BILATERAL     CERVICAL DISC ARTHROPLASTY N/A 05/01/2023   Procedure: C5-6 ARTHROPLASTY;  Surgeon: Clois Fret, MD;  Location: [facility];  Service: Neurosurgery;  Laterality: N/A;   CHOLECYSTECTOMY  04/09/1998   COLONOSCOPY  1998, 2010   COLONOSCOPY WITH ESOPHAGOGASTRODUODENOSCOPY (EGD)  05/29/2018   COLONOSCOPY WITH ESOPHAGOGASTRODUODENOSCOPY (EGD)  01/26/2022   CORONARY/GRAFT ACUTE MI REVASCULARIZATION N/A 06/16/2020   Procedure: Coronary/Graft Acute MI Revascularization;  Surgeon: Ammon Blunt, MD;  Location: [facility];  Service: Cardiovascular;  Laterality: N/A;   LEFT HEART CATH AND CORONARY ANGIOGRAPHY N/A 06/16/2020   Procedure: LEFT HEART CATH AND  CORONARY ANGIOGRAPHY;  Surgeon: Ammon Blunt, MD;  Location: [facility];  Service: Cardiovascular;  Laterality: N/A;   LYMPH NODE DISSECTION  06/13/2007   NASAL SEPTUM SURGERY     RETINAL DETACHMENT SURGERY Left    TOTAL THYROIDECTOMY  06/13/2007   Family History  Problem Relation Age of Onset   Hyperlipidemia Father    Heart disease Father    Social History   Socioeconomic History   Marital status: Married    Spouse name: Rilla   Number of children: 3   Years of education: Not on file   Highest education level: Professional school degree (e.g., MD, DDS, DVM, JD)  Occupational History   Not on file  Tobacco Use   Smoking status: Never   Smokeless tobacco: Never  Vaping Use   Vaping status: Never Used  Substance and Sexual Activity   Alcohol use: Yes    Comment: weekly   Drug use: No   Sexual activity: Not on file  Other Topics Concern   Not on file  Social History Narrative   ** Merged History Encounter **       Social Drivers of Health   Financial Resource Strain: Low Risk  (02/06/2024)   Overall Financial Resource Strain (CARDIA)    Difficulty of Paying Living Expenses: Not very hard  Food Insecurity: No Food Insecurity (02/06/2024)   Hunger Vital Sign    Worried About Running Out of Food in the Last Year: Never true    Ran Out of Food in the Last Year: Never true  Transportation Needs: No Transportation Needs (02/06/2024)   PRAPARE - Administrator, Civil Service (Medical): No    Lack of Transportation (Non-Medical): No  Physical Activity: Insufficiently Active (02/06/2024)   Exercise Vital Sign    Days of Exercise per Week: 2 days    Minutes of Exercise per Session: 40 min  Stress: No Stress Concern Present (02/06/2024)   [employer] of Occupational Health - Occupational Stress Questionnaire    Feeling of Stress: Only a little  Social Connections: Socially Integrated (02/06/2024)   Social Connection and Isolation  Panel    Frequency of Communication with Friends and Family: More than three times a week    Frequency of Social Gatherings with Friends and Family: Twice a week    Attends Religious Services: 1 to 4 times per year    Active Member of [employer] or Organizations: Yes    Attends Banker Meetings: 1 to 4 times per year    Marital Status: Married     Review of Systems  Constitutional:  Positive for fatigue. Negative for appetite change and unexpected weight change.  HENT:  Negative for congestion and sinus pressure.   Respiratory:  Negative for cough, chest tightness and shortness of breath.   Cardiovascular:  Negative for chest pain, palpitations and leg swelling.  Gastrointestinal:  Negative for abdominal pain, diarrhea, nausea and vomiting.  Genitourinary:  Negative for difficulty urinating and dysuria.  Musculoskeletal:  Negative for joint swelling and myalgias.  Skin:  Negative for color change and rash.  Neurological:  Negative for dizziness and headaches.  Psychiatric/Behavioral:  Negative for agitation and dysphoric mood.        Objective:     BP 128/80 (Cuff Size: Normal)   Pulse 83   Temp 97.8 F (36.6 C) (Oral)   Ht 6' 1 (1.854 m)   Wt 200 lb 8 oz (90.9 kg)   SpO2 96%   BMI 26.45 kg/m  Wt Readings from Last 3 Encounters:  02/07/24 200 lb (90.7 kg)  02/07/24 200 lb 8 oz (90.9 kg)  01/28/24 203 lb 12.8 oz (92.4 kg)    Physical Exam Constitutional:      General: He is not in acute distress.    Appearance: Normal appearance. He is well-developed.  HENT:     Head: Normocephalic and atraumatic.     Right Ear: External ear normal.     Left Ear: External ear normal.     Mouth/Throat:     Pharynx: No oropharyngeal exudate or posterior oropharyngeal erythema.  Eyes:     General: No scleral icterus.       Right eye: No discharge.        Left eye: No discharge.  Cardiovascular:     Rate and Rhythm: Normal rate and regular rhythm.  Pulmonary:     Effort:  Pulmonary effort is normal. No respiratory distress.     Breath sounds: Normal breath sounds.  Abdominal:     General: Bowel sounds are normal.     Palpations: Abdomen is soft.     Tenderness: There is no abdominal tenderness.  Musculoskeletal:        General: No swelling or tenderness.     Cervical back: Neck supple. No tenderness.  Lymphadenopathy:     Cervical: No cervical adenopathy.  Skin:    Findings: No erythema or rash.  Neurological:     Mental Status: He is alert.  Psychiatric:        Mood and Affect: Mood normal.        Behavior: Behavior normal.         Outpatient Encounter Medications as of 02/07/2024  Medication Sig   Alirocumab  (PRALUENT ) 150 MG/ML SOAJ Inject 1 mL (150 mg total) into the skin every 14 (fourteen) days.   aspirin  81 MG chewable tablet Chew 1 tablet (81 mg total) by mouth daily.   atorvastatin  (LIPITOR ) 10 MG tablet Take 1 tablet (10 mg total) by mouth once daily (Patient taking differently: Take 10 mg by mouth daily. Take 3 times a week)   Cholecalciferol  (VITAMIN D ) 50 MCG (2000 UT) tablet Take 2,000 Units by mouth daily.   ciprofloxacin  (CIPRO ) 500 MG tablet Take 1 tablet (500 mg total) by mouth 2 (two) times daily as needed for severe diarrhea.   ketoconazole  (NIZORAL ) 2 % cream Apply 1 Application topically 2 (two) times daily.   ketoconazole  (NIZORAL ) 2 % shampoo Apply 1 Application topically 3 (three) times a week. Wash scalp 3 times weekly, let sit 5 minutes and rinse out   levothyroxine  (SYNTHROID ) 175 MCG tablet Take 175 mcg by mouth daily before breakfast.   meclizine  (ANTIVERT ) 25 MG tablet Take 1 tablet (25 mg total) by mouth every 8 (eight) hours as needed for vertigo.   minoxidil  (LONITEN ) 2.5 MG tablet Take 1 tablet (2.5 mg total) by mouth 2 (two) times daily.   Multiple Vitamins-Minerals (PRESERVISION AREDS 2) CAPS Take 1 capsule by mouth 2 (two) times daily.   promethazine  (PHENERGAN ) 25 MG suppository Place 1 suppository (25  mg  total) rectally every 8 (eight) hours as needed for nausea/vomiting.   RESTASIS  0.05 % ophthalmic emulsion Place 1 drop into both eyes 2 (two) times daily.   RESTASIS  0.05 % ophthalmic emulsion Place 1 drop into both eyes 2 (two) times daily.   Ruxolitinib  Phosphate (OPZELURA ) 1.5 % CREA Apply 1 Application topically daily to areas of vitiligo on body   scopolamine  (TRANSDERM-SCOP) 1 MG/3DAYS Place 1 patch (1.5 mg total) onto the skin behind the year every three (3) days as needed for vertigo, do not get ointment in the eyes.   terbinafine (LAMISIL) 250 MG tablet Take 1 tablet (250 mg total) by mouth daily for 14 days. Take 250 mg daily for one month   testosterone  cypionate (DEPOTESTOSTERONE CYPIONATE) 200 MG/ML injection Inject 1 mL (200 mg total) into the muscle every 14 (fourteen) days.   tobramycin  (TOBREX ) 0.3 % ophthalmic solution Place 1 drop into both eyes 4 (four) times daily for 3 days   valACYclovir  (VALTREX ) 1000 MG tablet Take 1 tablet (1,000 mg total) by mouth as directed. Take 2 tablets at onset of fever blister symptoms then 2 tablets 12 hours later   [DISCONTINUED] ISOtretinoin  (ACCUTANE ) 20 MG capsule Take 1 capsule (20 mg total) by mouth 2 (two) times daily. (Patient not taking: Reported on 02/07/2024)   [DISCONTINUED] minoxidil  (LONITEN ) 2.5 MG tablet Take 2 tablets (5 mg total) by mouth daily. (Patient taking differently: Take 2.5 mg by mouth daily.)   [DISCONTINUED] Ruxolitinib  Phosphate (OPZELURA ) 1.5 % CREA Apply 1 Application topically daily to areas of vitiligo on body   [DISCONTINUED] valACYclovir  (VALTREX ) 1000 MG tablet Take 1 tablet (1,000 mg total) by mouth as directed. Take 2 tablets at onset of fever blister symptoms then 2 tablets 12 hours later   No facility-administered encounter medications on file as of 02/07/2024.     Lab Results  Component Value Date   WBC 4.6 02/07/2024   HGB 14.0 02/07/2024   HCT 41.0 02/07/2024   PLT 204.0 02/07/2024   GLUCOSE 97  02/07/2024   CHOL 97 02/07/2024   TRIG 79.0 02/07/2024   HDL 39.40 02/07/2024   LDLCALC 42 02/07/2024   ALT 15 02/07/2024   AST 13 02/07/2024   NA 140 02/07/2024   K 4.0 02/07/2024   CL 105 02/07/2024   CREATININE 1.21 02/07/2024   BUN 19 02/07/2024   CO2 29 02/07/2024   TSH 0.69 02/07/2024   PSA 0.57 01/25/2023   INR 1.0 06/16/2020   HGBA1C 5.7 09/04/2019    DG Cervical Spine 2 or 3 views Result Date: 08/10/2023 CLINICAL DATA:  History of prior cervical fusion EXAM: CERVICAL SPINE - 2- VIEW COMPARISON:  06/10/2023 FINDINGS: Prior disc replacement at C5-6 is noted. Osteophytic changes are noted at C4-5. No soft tissue abnormality is seen. The odontoid is normal limits. IMPRESSION: Prior disc replacement at C5-6. Electronically Signed   By: Oneil Devonshire M.D.   On: 08/10/2023 00:34       Assessment & Plan:  Hypercholesterolemia Assessment & Plan: Continues on praluent . Taking and tolerating lipitor  three days per week. Check lipid panel today.   Orders: -     Lipid panel -     Basic metabolic panel with GFR -     Hepatic function panel  Iron  deficiency anemia, unspecified iron  deficiency anemia type Assessment & Plan: Has been receiving iron  infusions.  Continue f/u with Dr Rennie. EGD: 01/26/2022 - no endoscopic abnormality evident to explain patient's complaint of dysphagia, normal  esophagus, gastritis, normal duodenum. No sources of anemia found. CSY: 01/26/2022 - normal examined colon, Grade II non-bleeding IH.  SABRA F/u with Dr Rennie 12/20/23 - f/u IDA. Received venofer . Continue gentle iron .  Recent black stool. Check cbc today.   Orders: -     CBC with Differential/Platelet -     Ferritin -     Vitamin B12  Obstructive sleep apnea  THYROID  CANCER, HX OF Assessment & Plan: Followed by Dr Nichole. Evaluated this past summer. Stable.    Orders: -     TSH  Other fatigue Assessment & Plan: Has noticed fatigue as outlined. No chest pain or sob. Being followed by  cardiology. Planning stress test as outlined. Recently evaluated by Dr Nichole - thyroid  ok. Will check metabolic panel and thyroid  function today. Given recent notice of black stool, check cbc. Also check vitamin D  level and B12.   Orders: -     HIV Antibody (routine testing w rflx)  Hypocalcemia Assessment & Plan: Check vitamin D  level today.   Orders: -     VITAMIN D  25 Hydroxy (Vit-D Deficiency, Fractures)  OSA (obstructive sleep apnea) Assessment & Plan: Followed by pulmonary. Continues with cpap.    Midline low back pain with left-sided sciatica, unspecified chronicity Assessment & Plan: Some low back discomfort. Recommended exercises - core strengthening. (Per [facility]).    Low testosterone  Assessment & Plan: Seeing Dr Twylla.    Black stool Assessment & Plan: Noticed black stool as outlined. On iron . Check cbc. Hemoccult cards.       Allena Hamilton, MD

## 2024-02-08 ENCOUNTER — Encounter: Payer: Self-pay | Admitting: Internal Medicine

## 2024-02-08 ENCOUNTER — Ambulatory Visit: Payer: Self-pay | Admitting: Internal Medicine

## 2024-02-08 DIAGNOSIS — K921 Melena: Secondary | ICD-10-CM | POA: Insufficient documentation

## 2024-02-08 LAB — HIV ANTIBODY (ROUTINE TESTING W REFLEX)
HIV 1&2 Ab, 4th Generation: NONREACTIVE
HIV FINAL INTERPRETATION: NEGATIVE

## 2024-02-08 NOTE — Assessment & Plan Note (Signed)
 Followed by Dr Nichole. Evaluated this past summer. Stable.

## 2024-02-08 NOTE — Assessment & Plan Note (Signed)
 Continues on praluent . Taking and tolerating lipitor  three days per week. Check lipid panel today.

## 2024-02-08 NOTE — Assessment & Plan Note (Signed)
Seeing Dr Bernardo Heater.

## 2024-02-08 NOTE — Assessment & Plan Note (Signed)
Followed by pulmonary.  Continues with cpap.

## 2024-02-08 NOTE — Assessment & Plan Note (Signed)
 Noticed black stool as outlined. On iron . Check cbc. Hemoccult cards.

## 2024-02-08 NOTE — Assessment & Plan Note (Signed)
 Check vitamin D level today

## 2024-02-08 NOTE — Assessment & Plan Note (Signed)
 Has been receiving iron  infusions.  Continue f/u with Dr Rennie. EGD: 01/26/2022 - no endoscopic abnormality evident to explain patient's complaint of dysphagia, normal esophagus, gastritis, normal duodenum. No sources of anemia found. CSY: 01/26/2022 - normal examined colon, Grade II non-bleeding IH.  SABRA F/u with Dr Rennie 12/20/23 - f/u IDA. Received venofer . Continue gentle iron .  Recent black stool. Check cbc today.

## 2024-02-08 NOTE — Assessment & Plan Note (Signed)
 Some low back discomfort. Recommended exercises - core strengthening. (Per [facility]).

## 2024-02-08 NOTE — Assessment & Plan Note (Signed)
 Has noticed fatigue as outlined. No chest pain or sob. Being followed by cardiology. Planning stress test as outlined. Recently evaluated by Dr Nichole - thyroid  ok. Will check metabolic panel and thyroid  function today. Given recent notice of black stool, check cbc. Also check vitamin D  level and B12.

## 2024-02-09 DIAGNOSIS — R0902 Hypoxemia: Secondary | ICD-10-CM | POA: Diagnosis not present

## 2024-02-12 NOTE — Telephone Encounter (Signed)
 LVM to call back to office to see if he has picked up kit yet and also that he can return it to our office. Please relay when pt calls back

## 2024-02-13 ENCOUNTER — Other Ambulatory Visit: Payer: Self-pay | Admitting: Urology

## 2024-02-13 ENCOUNTER — Other Ambulatory Visit: Payer: Self-pay

## 2024-02-17 ENCOUNTER — Other Ambulatory Visit: Payer: Self-pay

## 2024-02-17 ENCOUNTER — Encounter: Payer: Self-pay | Admitting: Sleep Medicine

## 2024-02-17 ENCOUNTER — Encounter: Payer: Self-pay | Admitting: Internal Medicine

## 2024-02-17 ENCOUNTER — Inpatient Hospital Stay: Attending: Internal Medicine

## 2024-02-17 ENCOUNTER — Encounter: Payer: Self-pay | Admitting: Urology

## 2024-02-17 ENCOUNTER — Other Ambulatory Visit: Payer: Self-pay | Admitting: Urology

## 2024-02-17 ENCOUNTER — Other Ambulatory Visit: Payer: Self-pay | Admitting: *Deleted

## 2024-02-17 DIAGNOSIS — D509 Iron deficiency anemia, unspecified: Secondary | ICD-10-CM | POA: Diagnosis not present

## 2024-02-17 DIAGNOSIS — E291 Testicular hypofunction: Secondary | ICD-10-CM

## 2024-02-17 DIAGNOSIS — E611 Iron deficiency: Secondary | ICD-10-CM

## 2024-02-17 LAB — CBC WITH DIFFERENTIAL (CANCER CENTER ONLY)
Abs Immature Granulocytes: 0.03 K/uL (ref 0.00–0.07)
Basophils Absolute: 0 K/uL (ref 0.0–0.1)
Basophils Relative: 1 %
Eosinophils Absolute: 0.2 K/uL (ref 0.0–0.5)
Eosinophils Relative: 3 %
HCT: 38.8 % — ABNORMAL LOW (ref 39.0–52.0)
Hemoglobin: 13.5 g/dL (ref 13.0–17.0)
Immature Granulocytes: 0 %
Lymphocytes Relative: 19 %
Lymphs Abs: 1.6 K/uL (ref 0.7–4.0)
MCH: 29.9 pg (ref 26.0–34.0)
MCHC: 34.8 g/dL (ref 30.0–36.0)
MCV: 85.8 fL (ref 80.0–100.0)
Monocytes Absolute: 0.9 K/uL (ref 0.1–1.0)
Monocytes Relative: 11 %
Neutro Abs: 5.6 K/uL (ref 1.7–7.7)
Neutrophils Relative %: 66 %
Platelet Count: 218 K/uL (ref 150–400)
RBC: 4.52 MIL/uL (ref 4.22–5.81)
RDW: 14.3 % (ref 11.5–15.5)
WBC Count: 8.4 K/uL (ref 4.0–10.5)
nRBC: 0 % (ref 0.0–0.2)

## 2024-02-17 LAB — IRON AND TIBC
Iron: 27 ug/dL — ABNORMAL LOW (ref 45–182)
Saturation Ratios: 8 % — ABNORMAL LOW (ref 17.9–39.5)
TIBC: 322 ug/dL (ref 250–450)
UIBC: 295 ug/dL

## 2024-02-17 LAB — FERRITIN: Ferritin: 54 ng/mL (ref 24–336)

## 2024-02-17 MED ORDER — XYOSTED 100 MG/0.5ML ~~LOC~~ SOAJ: SUBCUTANEOUS | 2 refills | Status: DC

## 2024-02-17 NOTE — Telephone Encounter (Signed)
 Pt has responded via my chart and has been informed on stool kit being available for pickup

## 2024-02-20 ENCOUNTER — Inpatient Hospital Stay

## 2024-02-20 ENCOUNTER — Telehealth: Payer: Self-pay

## 2024-02-20 ENCOUNTER — Other Ambulatory Visit: Payer: Self-pay

## 2024-02-20 VITALS — BP 116/74 | HR 96 | Temp 98.1°F | Resp 16

## 2024-02-20 DIAGNOSIS — E611 Iron deficiency: Secondary | ICD-10-CM

## 2024-02-20 DIAGNOSIS — D509 Iron deficiency anemia, unspecified: Secondary | ICD-10-CM | POA: Diagnosis not present

## 2024-02-20 MED ORDER — IRON SUCROSE 20 MG/ML IV SOLN
200.0000 mg | Freq: Once | INTRAVENOUS | Status: AC
  Administered 2024-02-20: 200 mg via INTRAVENOUS
  Filled 2024-02-20: qty 10

## 2024-02-20 MED ORDER — DOXYCYCLINE MONOHYDRATE 100 MG PO CAPS
100.0000 mg | ORAL_CAPSULE | Freq: Two times a day (BID) | ORAL | 0 refills | Status: AC
  Filled 2024-02-20: qty 38, 19d supply, fill #0
  Filled 2024-02-20: qty 22, 11d supply, fill #0

## 2024-02-20 NOTE — Telephone Encounter (Signed)
 I have sent urgent message to [employer] asking them to check on this issue. It was probably sent to Dr. Laurena' old practice

## 2024-02-20 NOTE — Telephone Encounter (Signed)
 Patient advised molecular study showed no signs of fungus. Will treat for possible Lyme's with doxycycline  monohydrate 100 mg bid x 1 month. Patient advised not to start isotretinoin  while taking doxycycline . Doxycycline  should be taken with food to prevent nausea. Do not lay down for 30 minutes after taking. Be cautious with sun exposure and use good sun protection while on this medication. Pregnant women should not take this medication.  Lonell RAMAN., RMA

## 2024-02-20 NOTE — Patient Instructions (Signed)

## 2024-02-20 NOTE — Telephone Encounter (Signed)
 Matthew Kelley can you reach out to [employer] and check on the ONO results? Thank you!

## 2024-02-21 ENCOUNTER — Telehealth: Payer: Self-pay | Admitting: Internal Medicine

## 2024-02-21 ENCOUNTER — Other Ambulatory Visit: Payer: Self-pay

## 2024-02-21 ENCOUNTER — Ambulatory Visit: Payer: Self-pay

## 2024-02-21 MED ORDER — HYDROCOD POLI-CHLORPHE POLI ER 10-8 MG/5ML PO SUER
5.0000 mL | Freq: Every evening | ORAL | 0 refills | Status: AC | PRN
  Filled 2024-02-21: qty 115, 23d supply, fill #0

## 2024-02-21 MED ORDER — HYDROCOD POLI-CHLORPHE POLI ER 10-8 MG/5ML PO SUER: 5.0000 mL | Freq: Every evening | ORAL | 0 refills | Status: DC | PRN

## 2024-02-21 NOTE — Telephone Encounter (Signed)
 Pt is a [employer]. Requesting Tussionex be prescribed. Also on doxy for potential lyme.  [facility] [[id number]]   *If later in the day, please send to [facility] [address] as the above closes at 6pm.  Reason for Disposition  Cough  Answer Assessment - Initial Assessment Questions Productive cough, primarily in the evenings, x 1 week. Has not taken covid or flu test.   1. ONSET: When did the cough begin?      One week 2. SEVERITY: How bad is the cough today?      Severe at night 3. SPUTUM: Describe the color of your sputum (e.g., none, dry cough; clear, white, yellow, green)     Yellow or grey 4. HEMOPTYSIS: Are you coughing up any blood? If Yes, ask: How much? (e.g., flecks, streaks, tablespoons, etc.)     Denies, some when blowing his nose 5. DIFFICULTY BREATHING: Are you having difficulty breathing? If Yes, ask: How bad is it? (e.g., mild, moderate, severe)      Difficult to use CPAP at night d/t cough 6. FEVER: Do you have a fever? If Yes, ask: What is your temperature, how was it measured, and when did it start?     Has not checked  Protocols used: Cough - Acute Productive-A-AH Copied from CRM #[id number]. Topic: Clinical - Medication Question >> Feb 21, 2024  8:57 AM Ahlexyia S wrote: Reason for CRM: Pt called in stating that he does have the flu. Pt is having a worsening cough and isnt really getting any sleep at night. Pt is wanting to know if he can be prescribed some cough medicine, pt stated he has previously taken Tussionex Pennkinetic. Pt is okay with coming into clinic but he doesn't want to expose anyone to the flu. Pt is requesting a callback.  Past Medical History:  Diagnosis Date   Aortic atherosclerosis    Atypical nevus 09/07/2008   Left scapula, lateral. Moderate atypia.   Blood in stool    Coronary artery disease involving native coronary artery of native heart without angina pectoris 06/16/2020    a.) anterior STEMI 06/16/2020 --> LHC/PCI: 40% OM2, 99% pLAD (3.0 x 26 mm Resolute Onyx DES), 50% mLAD, 30% pRCA; b.) cMRI 11/25/2020: EF 62%, subendocardial infarction (< 25% transmural) involving m-d anteroseptal wall involving the true apex and distal inf wall   DDD (degenerative disc disease), cervical    Detached retina, left 2010   Diastolic dysfunction    a.) TTE 09/08/2020: EF >55%, no RWMAs, G1DD, norm RVSF, triv MR/TR/PR; b.) stress TTE 03/15/2023: LBBB with peak stress, max workload 13.4 METS, dias function/doppler for regurg/stenosis not performed   Dysplastic nevus 12/20/2009   Spinal lower back. Moderate atypia, edges free   Dysplastic nevus 12/20/2009   Left paraspinal lower back. Mild atypia, edges free.   Dysplastic nevus 11/24/2018   Right lower back. Mild atypia, deep margin involved.   Dysplastic nevus 11/24/2018   Right mid back. Moderate atypia and halo nevus effect. Limited margins free.   GI bleed    Herniation of cervical intervertebral disc with radiculopathy 12/2022   a.) secondary to exercise/regimen with personal trainer   History of chicken pox    Hypercholesterolemia    Hyperlipidemia, mixed    Hypogonadism in male    a.) on exogenous TRT (testosteronew cypionate) injections   Hypothyroidism, postsurgical    Iron  deficiency anemia    Obstructive sleep apnea on CPAP    Radiculopathy, lumbar region  Rate-related  LEFT bundle branch block    ST elevation myocardial infarction (STEMI) of anterior wall (HCC) 11/25/2020   a.) LHC/PCI: 40% OM2, 99% pLAD (3.0 x 26 mm Resolute Onyx DES), 50% mLAD, 30% pRCA   Thyroid  cancer (HCC) 2009   a.) s/p total thyroidectomy   Vertigo    Vitamin D  deficiency

## 2024-02-21 NOTE — Telephone Encounter (Signed)
 See my chart message

## 2024-02-22 NOTE — Telephone Encounter (Signed)
 Late entry. Called and spoke to Dr Hester - 02/21/24 - 1730. Has had increased cough/congestion this week. Is feeling better.  Just needing something to help suppress cough at night. Has taken tussionex and tolerated. Using cpap nightly. No sob. Will notify me if symptoms do not continue to improve.  Rx sent in to [facility] for tussionex.

## 2024-02-27 DIAGNOSIS — H35411 Lattice degeneration of retina, right eye: Secondary | ICD-10-CM | POA: Diagnosis not present

## 2024-02-27 DIAGNOSIS — H43812 Vitreous degeneration, left eye: Secondary | ICD-10-CM | POA: Diagnosis not present

## 2024-02-27 DIAGNOSIS — H31093 Other chorioretinal scars, bilateral: Secondary | ICD-10-CM | POA: Diagnosis not present

## 2024-02-27 DIAGNOSIS — H04123 Dry eye syndrome of bilateral lacrimal glands: Secondary | ICD-10-CM | POA: Diagnosis not present

## 2024-02-27 DIAGNOSIS — H353134 Nonexudative age-related macular degeneration, bilateral, advanced atrophic with subfoveal involvement: Secondary | ICD-10-CM | POA: Diagnosis not present

## 2024-02-27 DIAGNOSIS — H35372 Puckering of macula, left eye: Secondary | ICD-10-CM | POA: Diagnosis not present

## 2024-02-28 ENCOUNTER — Encounter: Payer: Self-pay | Admitting: Dermatology

## 2024-03-03 ENCOUNTER — Other Ambulatory Visit: Payer: Self-pay

## 2024-03-04 ENCOUNTER — Other Ambulatory Visit: Payer: Self-pay

## 2024-03-04 ENCOUNTER — Other Ambulatory Visit: Payer: Self-pay | Admitting: Dermatology

## 2024-03-04 MED ORDER — KETOCONAZOLE 2 % EX SHAM
1.0000 | MEDICATED_SHAMPOO | CUTANEOUS | 4 refills | Status: AC
  Filled 2024-03-04: qty 360, 168d supply, fill #0
  Filled 2024-04-04: qty 360, 90d supply, fill #0

## 2024-03-10 ENCOUNTER — Encounter: Payer: Self-pay | Admitting: Urology

## 2024-03-10 ENCOUNTER — Other Ambulatory Visit: Payer: Self-pay | Admitting: *Deleted

## 2024-03-12 ENCOUNTER — Encounter: Payer: Self-pay | Admitting: Neurosurgery

## 2024-03-13 ENCOUNTER — Encounter: Payer: Self-pay | Admitting: Internal Medicine

## 2024-03-13 ENCOUNTER — Other Ambulatory Visit: Payer: Self-pay

## 2024-03-13 ENCOUNTER — Other Ambulatory Visit: Payer: Self-pay | Admitting: Urology

## 2024-03-13 MED ORDER — TADALAFIL 20 MG PO TABS
20.0000 mg | ORAL_TABLET | ORAL | 3 refills | Status: AC
  Filled 2024-03-13: qty 30, 30d supply, fill #0

## 2024-03-13 MED ORDER — XYOSTED 100 MG/0.5ML ~~LOC~~ SOAJ: SUBCUTANEOUS | 2 refills | Status: DC

## 2024-03-16 ENCOUNTER — Other Ambulatory Visit: Payer: Self-pay

## 2024-03-17 ENCOUNTER — Other Ambulatory Visit (HOSPITAL_COMMUNITY): Payer: Self-pay

## 2024-03-18 ENCOUNTER — Other Ambulatory Visit: Payer: Self-pay

## 2024-03-18 ENCOUNTER — Other Ambulatory Visit (HOSPITAL_COMMUNITY): Payer: Self-pay

## 2024-03-18 MED ORDER — XYOSTED 100 MG/0.5ML ~~LOC~~ SOAJ
100.0000 mg | SUBCUTANEOUS | 2 refills | Status: DC
  Filled 2024-03-18: qty 2, 28d supply, fill #0

## 2024-03-19 ENCOUNTER — Other Ambulatory Visit: Payer: Self-pay

## 2024-03-20 ENCOUNTER — Other Ambulatory Visit: Payer: Self-pay | Admitting: Urology

## 2024-03-20 ENCOUNTER — Encounter (HOSPITAL_COMMUNITY): Payer: Self-pay | Admitting: Pharmacist

## 2024-03-20 ENCOUNTER — Other Ambulatory Visit (HOSPITAL_COMMUNITY): Payer: Self-pay

## 2024-03-23 ENCOUNTER — Encounter: Payer: Self-pay | Admitting: Dermatology

## 2024-03-23 ENCOUNTER — Ambulatory Visit: Admitting: Dermatology

## 2024-03-23 ENCOUNTER — Other Ambulatory Visit (HOSPITAL_COMMUNITY): Payer: Self-pay

## 2024-03-23 ENCOUNTER — Other Ambulatory Visit: Payer: Self-pay

## 2024-03-23 DIAGNOSIS — L578 Other skin changes due to chronic exposure to nonionizing radiation: Secondary | ICD-10-CM | POA: Diagnosis not present

## 2024-03-23 DIAGNOSIS — L814 Other melanin hyperpigmentation: Secondary | ICD-10-CM | POA: Diagnosis not present

## 2024-03-23 DIAGNOSIS — L300 Nummular dermatitis: Secondary | ICD-10-CM

## 2024-03-23 DIAGNOSIS — L820 Inflamed seborrheic keratosis: Secondary | ICD-10-CM

## 2024-03-23 DIAGNOSIS — Z1283 Encounter for screening for malignant neoplasm of skin: Secondary | ICD-10-CM

## 2024-03-23 DIAGNOSIS — L700 Acne vulgaris: Secondary | ICD-10-CM | POA: Diagnosis not present

## 2024-03-23 DIAGNOSIS — W908XXA Exposure to other nonionizing radiation, initial encounter: Secondary | ICD-10-CM | POA: Diagnosis not present

## 2024-03-23 DIAGNOSIS — Z86018 Personal history of other benign neoplasm: Secondary | ICD-10-CM

## 2024-03-23 DIAGNOSIS — L80 Vitiligo: Secondary | ICD-10-CM | POA: Diagnosis not present

## 2024-03-23 DIAGNOSIS — L821 Other seborrheic keratosis: Secondary | ICD-10-CM | POA: Diagnosis not present

## 2024-03-23 DIAGNOSIS — D1801 Hemangioma of skin and subcutaneous tissue: Secondary | ICD-10-CM | POA: Diagnosis not present

## 2024-03-23 DIAGNOSIS — L738 Other specified follicular disorders: Secondary | ICD-10-CM | POA: Diagnosis not present

## 2024-03-23 DIAGNOSIS — D2339 Other benign neoplasm of skin of other parts of face: Secondary | ICD-10-CM

## 2024-03-23 DIAGNOSIS — D229 Melanocytic nevi, unspecified: Secondary | ICD-10-CM

## 2024-03-23 MED ORDER — CEPHALEXIN 500 MG PO CAPS
500.0000 mg | ORAL_CAPSULE | Freq: Two times a day (BID) | ORAL | 3 refills | Status: AC
  Filled 2024-03-23: qty 28, 14d supply, fill #0
  Filled 2024-04-04 (×2): qty 28, 14d supply, fill #1

## 2024-03-23 MED ORDER — ISOTRETINOIN 20 MG PO CAPS
20.0000 mg | ORAL_CAPSULE | Freq: Two times a day (BID) | ORAL | 0 refills | Status: AC
  Filled 2024-03-23 – 2024-03-24 (×2): qty 60, 30d supply, fill #0

## 2024-03-23 MED ORDER — MUPIROCIN 2 % EX OINT
1.0000 | TOPICAL_OINTMENT | Freq: Two times a day (BID) | CUTANEOUS | 4 refills | Status: AC
  Filled 2024-03-23: qty 88, 44d supply, fill #0
  Filled 2024-05-06: qty 22, 30d supply, fill #1

## 2024-03-23 MED ORDER — XYOSTED 100 MG/0.5ML ~~LOC~~ SOAJ
100.0000 mg | SUBCUTANEOUS | 0 refills | Status: AC
  Filled 2024-03-23 – 2024-03-24 (×2): qty 6, 84d supply, fill #0

## 2024-03-23 MED ORDER — DOXYCYCLINE MONOHYDRATE 100 MG PO CAPS
100.0000 mg | ORAL_CAPSULE | Freq: Two times a day (BID) | ORAL | 1 refills | Status: AC
  Filled 2024-03-23: qty 60, 30d supply, fill #0
  Filled 2024-05-02: qty 60, 30d supply, fill #1

## 2024-03-23 MED ORDER — SULFAMETHOXAZOLE-TRIMETHOPRIM 800-160 MG PO TABS
1.0000 | ORAL_TABLET | Freq: Two times a day (BID) | ORAL | 3 refills | Status: AC
  Filled 2024-03-23: qty 28, 14d supply, fill #0
  Filled 2024-04-04 (×2): qty 28, 14d supply, fill #1

## 2024-03-23 NOTE — Progress Notes (Signed)
 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 The patient has spots, moles and lesions to be evaluated, some may be new or changing and the patient may have concern these could be cancer.    The following portions of the chart were reviewed this encounter and updated as appropriate: medications, allergies, medical history  Review of Systems:  No other skin or systemic complaints except as noted in HPI or Assessment and Plan.  Objective  Well appearing patient in no apparent distress; mood and affect are within normal limits.  A full examination was performed including scalp, head, eyes, ears, nose, lips, neck, chest, axillae, abdomen, back, buttocks, bilateral upper extremities, bilateral lower extremities, hands, feet, fingers, toes, fingernails, and toenails. All findings within normal limits unless otherwise noted below.   Relevant physical exam findings are noted in the Assessment and Plan.  Spinal mid back x 4, R lower leg x 1 (5) Stuck on waxy paps with erythema  Assessment & Plan   SKIN CANCER SCREENING PERFORMED TODAY.  ACTINIC DAMAGE - Chronic condition, secondary to cumulative UV/sun exposure - diffuse scaly erythematous macules with underlying dyspigmentation - Recommend daily broad spectrum sunscreen SPF 30+ to sun-exposed areas, reapply every 2 hours as needed.  - Staying in the shade or wearing long sleeves, sun glasses (UVA+UVB protection) and wide brim hats (4-inch brim around the entire circumference of the hat) are also recommended for sun protection.  - Call for new or changing lesions.  LENTIGINES, SEBORRHEIC KERATOSES, HEMANGIOMAS - Benign normal skin lesions - Benign-appearing - Call for any changes  MELANOCYTIC NEVI - Tan-brown and/or pink-flesh-colored  symmetric macules and papules - Benign appearing on exam today - Observation - Call clinic for new or changing moles - Recommend daily use of broad spectrum spf 30+ sunscreen to sun-exposed areas.   HISTORY OF DYSPLASTIC NEVUS No evidence of recurrence today- L scapula, Spinal lower back, L paraspinal lower back, R lower back, R mid back Recommend regular full body skin exams Recommend daily broad spectrum sunscreen SPF 30+ to sun-exposed areas, reapply every 2 hours as needed.  Call if any new or changing lesions are noted between office visits    ACNE VULGARIS with SEBACEOUS HYPERPLASIA Face, back Exam:  multiple flesh yellow papules  Chronic and persistent condition with duration or expected duration over one year. Condition is symptomatic / bothersome to patient. Not to goal.   Treatment Plan: Baseline lab reviewed from 5/25, LFTS/Lipids wnl Pt confirmed in [facility] program IPLEDGE #  [id number], [facility] continue Isotretinoin  20mg  1 po bid with fatty meal  Isotretinoin  Counseling; Review and Contraception Counseling: Reviewed potential side effects of isotretinoin  including xerosis, cheilitis, hepatitis, hyperlipidemia, and severe birth defects if taken by a pregnant woman.  Women on isotretinoin  must be celibate (not having sex) or required to use at least 2 birth control methods to prevent pregnancy (unless patient is a male of non-child bearing potential).  Females of child-bearing potential must have monthly pregnancy tests while on isotretinoin  and report through I-Pledge (FDA monitoring program). Reviewed reports of suicidal ideation in those with a history of depression while taking isotretinoin  and reports of diagnosis of inflammatory bowl disease (IBD) while taking isotretinoin  as well as the lack of evidence for a causal relationship between isotretinoin , depression and IBD.  Patient advised to reach out with any questions or concerns. Patient advised not to share  pills or donate blood while on treatment or for one month after completing treatment. All patient's considering Isotretinoin  must read and understand and sign Isotretinoin  Consent Form and be registered with I-Pledge.   TRAVEL MEDICATIONS Patient has some upcoming travels [place] Treatment Plan: Cephalexin  500mg  1 po bid #28 3rf Septra  DS 1 po bid #28 3 rf Doxycycline  100mg  1 po bid #60 1rf (do not take while taking Isotretinoin ) Mupirocin  ointment bid 37yr rf   Nummular Dermatitis R post upper thigh Exam: Oval pink scaly patch, pt treated it with ketoconazole  cream but it didn't clear, Fungal ID test was negative  Chronic and persistent condition with duration or expected duration over one year. Condition is bothersome/symptomatic for patient. Currently flared.  Nummular dermatitis (eczema) is a chronic, relapsing, itchy rash that can significantly affect quality of life. It is often associated with dry skin and flares in the wintertime, and may require treatment with prescription topical anti-inflammatory medications, in addition to gentle skin care.  If there is associated atopic dermatitis and topicals are not working, then biologic injections may be necessary to clear rash and control symptoms.  Treatment Plan: start Opzelura  cr qd/bid until clear, then prn flares  Recommend mild soap and moisturizing cream 1-2 times daily.  Gentle skin care handout provided.     VITILIGO Post neck at hairline, R axilla Exam: depigmented patches on post neck at hairline, R axilla  Chronic and persistent condition with duration or expected duration over one year. Condition is symptomatic/ bothersome to patient. Not currently at goal.   Vitiligo is a chronic autoimmune condition which causes loss of skin pigment and is commonly seen on the face and may also involve areas of trauma like hands, elbows, knees, and ankles. There is no cure and it is difficult to treat.  Treatments include topical  steroids and other topical anti-inflammatory ointments/creams and topical and oral Jak inhibitors.  Sometimes narrow band UV light therapy or Xtrac laser is helpful, both of which require twice weekly treatments for at least 3-6 months.  Antioxidant vitamins, such as Vitamins A,C,E,D, Folic Acid and B12 may be added to enhance treatment. Heliocare may also enhance treatment results.  Treatment Plan: Cont Opzelura  cr bid aa vitiligo   DILATED PORE L nasal dorsum Exam: dilated pore  Treatment Plan: Benign-appearing.  Observation.  Call clinic for new or changing moles.  Recommend daily use of broad spectrum spf 30+ sunscreen to sun-exposed areas.    INFLAMED SEBORRHEIC KERATOSIS (5) Spinal mid back x 4, R lower leg x 1 (5) Symptomatic, irritating, patient would like treated. - Destruction of lesion - Spinal mid back x 4, R lower leg x 1 (5)  Destruction method: cryotherapy   Informed consent: discussed and consent obtained   Lesion destroyed using liquid nitrogen: Yes   Region frozen until ice ball extended beyond lesion: Yes   Outcome: patient tolerated procedure well with no complications   Post-procedure details: wound care instructions given   Additional details:  Prior to procedure, discussed risks of blister formation, small wound, skin dyspigmentation, or rare scar following cryotherapy. Recommend Vaseline ointment to treated areas while healing.   Return in about 1 month (around 04/23/2024) for Isotretinoin  f/u.  I, Grayce Saunas, RMA, am acting as scribe for Rexene Rattler, MD .   Documentation: I have reviewed the above documentation for accuracy and completeness, and I agree with the above.  Rexene Rattler, MD

## 2024-03-23 NOTE — Patient Instructions (Addendum)

## 2024-03-24 ENCOUNTER — Other Ambulatory Visit: Payer: Self-pay

## 2024-03-24 ENCOUNTER — Other Ambulatory Visit (HOSPITAL_COMMUNITY): Payer: Self-pay

## 2024-03-25 ENCOUNTER — Other Ambulatory Visit: Payer: Self-pay

## 2024-03-26 DIAGNOSIS — I2510 Atherosclerotic heart disease of native coronary artery without angina pectoris: Secondary | ICD-10-CM | POA: Diagnosis not present

## 2024-03-26 DIAGNOSIS — I252 Old myocardial infarction: Secondary | ICD-10-CM | POA: Diagnosis not present

## 2024-03-31 ENCOUNTER — Other Ambulatory Visit (HOSPITAL_COMMUNITY): Payer: Self-pay

## 2024-04-04 ENCOUNTER — Other Ambulatory Visit: Payer: Self-pay

## 2024-04-06 ENCOUNTER — Other Ambulatory Visit: Payer: Self-pay

## 2024-04-29 ENCOUNTER — Other Ambulatory Visit: Payer: Self-pay

## 2024-04-29 ENCOUNTER — Encounter: Payer: Self-pay | Admitting: Dermatology

## 2024-04-29 ENCOUNTER — Ambulatory Visit: Admitting: Dermatology

## 2024-04-29 DIAGNOSIS — L219 Seborrheic dermatitis, unspecified: Secondary | ICD-10-CM

## 2024-04-29 DIAGNOSIS — D485 Neoplasm of uncertain behavior of skin: Secondary | ICD-10-CM | POA: Diagnosis not present

## 2024-04-29 DIAGNOSIS — L700 Acne vulgaris: Secondary | ICD-10-CM | POA: Diagnosis not present

## 2024-04-29 DIAGNOSIS — L738 Other specified follicular disorders: Secondary | ICD-10-CM

## 2024-04-29 DIAGNOSIS — Z79899 Other long term (current) drug therapy: Secondary | ICD-10-CM

## 2024-04-29 DIAGNOSIS — D2239 Melanocytic nevi of other parts of face: Secondary | ICD-10-CM

## 2024-04-29 DIAGNOSIS — B360 Pityriasis versicolor: Secondary | ICD-10-CM

## 2024-04-29 DIAGNOSIS — K130 Diseases of lips: Secondary | ICD-10-CM

## 2024-04-29 DIAGNOSIS — D492 Neoplasm of unspecified behavior of bone, soft tissue, and skin: Secondary | ICD-10-CM

## 2024-04-29 DIAGNOSIS — L853 Xerosis cutis: Secondary | ICD-10-CM

## 2024-04-29 MED ORDER — ZORYVE 0.3 % EX FOAM
1.0000 | CUTANEOUS | 5 refills | Status: AC
  Filled 2024-04-29 – 2024-05-02 (×2): qty 60, 30d supply, fill #0

## 2024-04-29 MED ORDER — FLUCONAZOLE 200 MG PO TABS
200.0000 mg | ORAL_TABLET | ORAL | 0 refills | Status: AC
  Filled 2024-04-29: qty 12, 28d supply, fill #0

## 2024-04-29 MED ORDER — ISOTRETINOIN 20 MG PO CAPS
20.0000 mg | ORAL_CAPSULE | Freq: Two times a day (BID) | ORAL | 0 refills | Status: AC
  Filled 2024-04-29: qty 60, 30d supply, fill #0

## 2024-04-29 NOTE — Patient Instructions (Addendum)

## 2024-04-29 NOTE — Progress Notes (Signed)
 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  Objective  Well appearing patient in no apparent distress; mood and affect are within normal limits.  An examination of the face, neck, chest, and back was performed and relevant findings are noted below.   L lower chin 4.28mm pink flesh pap   Assessment & Plan   NEOPLASM OF SKIN L lower chin - Epidermal / dermal shaving  Lesion diameter (cm):  0.4 Informed consent: discussed and consent obtained   Patient was prepped and draped in usual sterile fashion: area prepped with alcohol. Anesthesia: the lesion was anesthetized in a standard fashion   Anesthetic:  1% lidocaine  w/ epinephrine  1-100,000 buffered w/ 8.4% NaHCO3 Instrument used: flexible razor blade   Hemostasis achieved with: pressure, aluminum chloride and electrodesiccation   Outcome: patient tolerated procedure well   Post-procedure details: wound care instructions given   Post-procedure details comment:  Ointment and small bandage applied  Specimen 1 - Surgical pathology Differential Diagnosis: Irritated Nevus r/o Atypia  Check Margins: yes 4.14mm pink flesh pap  ACNE VULGARIS with SEBACEOUS HYPERPLASIA Patient is currently on Isotretinoin  requiring FDA mandated monthly evaluations and laboratory monitoring. Condition is currently not to goal (must reach target dose based on weight and also have clear skin for 2 months prior to discontinuation in order to help prevent  relapse)  Exam findings: Multiple flesh yellow papules face Week # 8 Pharmacy [facility] iPLEDGE #[id number]  Total mg -  1200 mg Total mg/kg - 13.23mg /kg  Continue isotretinoin  20mg  1 po bid with fatty meal  Patient confirmed in iPledge and isotretinoin  sent to pharmacy.    Xerosis secondary to isotretinoin  therapy - Continue emollients as directed - Xyzal (levocetirizine) once a day and fish oil 1 gram daily may also help with dryness   Cheilitis secondary to isotretinoin  therapy - Continue lip balm as directed, Dr. Horald Cortibalm recommended   Long term medication management (isotretinoin ).  Patient is using long term (months to years) prescription medication to control their dermatologic condition.  These medications require periodic monitoring to evaluate for efficacy and side effects and may require periodic laboratory monitoring.  - While taking Isotretinoin  and for 30 days after you finish the medication, do not share pills, do not donate blood. It is very important that a women who could become pregnant not take this medicine or get a blood transfusion with this medicine in it. Isotretinoin  is best absorbed when taken with a fatty meal. Isotretinoin  can make you sensitive to the sun. Daily careful sun protection including sunscreen SPF 30+ when outdoors is recommended.  Tinea Versicolor Exam: Shoulder with discolored scaly patches Chronic and persistent condition with duration or expected duration over one year. Condition is symptomatic/ bothersome to patient. Not currently at goal.   Cont Ketoconazole  shampoo 2%  3x/wk as body wash, let sit 5 minutes and rinse off Start Diflucan  200mg  1 po 3x/wk x 1 month # 12, 0 rf  Tinea versicolor is a chronic recurrent skin  rash causing discolored scaly spots most commonly seen on back, chest, and/or shoulders.  It is generally asymptomatic. The rash is due to overgrowth of a common type of yeast present on everyone's skin and it is not  contagious.  It tends to flare more in the summer due to increased sweating on trunk.  After rash is treated, the scaliness will resolve, but the discoloration will take longer to return to normal pigmentation. The periodic use of an OTC medicated soap/shampoo with zinc or selenium sulfide can be helpful to prevent yeast overgrowth and recurrence.   Side effects of fluconazole  (diflucan ) include nausea, diarrhea, headache, dizziness, taste changes, rare risk of irritation of the liver, allergy, or decreased blood counts (which could show up as infection or tiredness).   SEBORRHEIC DERMATITIS face Exam: Pink patches with greasy scale at beard area  Chronic and persistent condition with duration or expected duration over one year. Condition is symptomatic/ bothersome to patient. Not currently at goal.  Seborrheic Dermatitis is a chronic persistent rash characterized by pinkness and scaling most commonly of the mid face but also can occur on the scalp (dandruff), ears; mid chest, mid back and groin.  It tends to be exacerbated by stress and cooler weather.  People who have neurologic disease may experience new onset or exacerbation of existing seborrheic dermatitis.  The condition is not curable but treatable and can be controlled.  Treatment Plan: Start Zoryve  foam qd to aa face prn flares   Follow-up in 30 days.  I, Grayce Saunas, RMA, am acting as scribe for Rexene Rattler, MD .   Documentation: I have reviewed the above documentation for accuracy and completeness, and I agree with the above.  Rexene Rattler, MD  "

## 2024-04-30 ENCOUNTER — Other Ambulatory Visit: Payer: Self-pay

## 2024-04-30 ENCOUNTER — Encounter: Payer: Self-pay | Admitting: Internal Medicine

## 2024-05-01 ENCOUNTER — Other Ambulatory Visit: Payer: Self-pay

## 2024-05-02 ENCOUNTER — Other Ambulatory Visit: Payer: Self-pay

## 2024-05-05 ENCOUNTER — Ambulatory Visit: Payer: Self-pay | Admitting: Dermatology

## 2024-05-05 LAB — SURGICAL PATHOLOGY

## 2024-05-06 ENCOUNTER — Other Ambulatory Visit: Payer: Self-pay

## 2024-05-06 NOTE — Telephone Encounter (Signed)
 Advised pt of bx results/sh ?

## 2024-05-06 NOTE — Telephone Encounter (Signed)
-----   Message from Rexene Rattler, MD sent at 05/05/2024  5:28 PM EST ----- 1. Skin, L lower chin :       MELANOCYTIC NEVUS, INTRADERMAL TYPE, BASE INVOLVED   Benign mole - please call patient

## 2024-05-07 ENCOUNTER — Other Ambulatory Visit: Payer: Self-pay

## 2024-05-14 ENCOUNTER — Ambulatory Visit

## 2024-05-14 DIAGNOSIS — D489 Neoplasm of uncertain behavior, unspecified: Secondary | ICD-10-CM

## 2024-05-14 NOTE — Progress Notes (Signed)
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  The following portions of the chart were reviewed this encounter and updated as appropriate: medications, allergies, medical history  Relevant Medical History:  Dysplastic nevi   Objective  (SKPE) Well appearing patient in no apparent distress; mood and affect are within normal limits. Examination was performed of the: Focused Exam of: left posterior waistline   Examination notable for: below  Examination limited by: Clothing and Patient deferred removal     Left posterior waistline 5mm pink crusted papule   Assessment & Plan  (SKAP)     Procedures, orders, diagnosis for this visit:  NEOPLASM OF UNCERTAIN BEHAVIOR Left posterior waistline - Epidermal / dermal shaving  Lesion diameter (cm):  0.5 Informed consent: discussed and consent obtained   Timeout: patient name, date of birth, surgical site, and procedure verified   Procedure prep:  Patient was prepped and draped in usual sterile fashion Prep type:  Isopropyl alcohol Anesthesia: the lesion was anesthetized in a standard fashion   Anesthetic:  1% lidocaine  w/ epinephrine  1-100,000 buffered w/ 8.4% NaHCO3 Instrument used: DermaBlade   Hemostasis achieved with: pressure and aluminum chloride   Outcome: patient tolerated procedure well   Post-procedure details: wound care instructions given    Specimen 1 - Surgical pathology Differential Diagnosis: ISK vs folliculitis vs Other   Check Margins: No  Neoplasm of uncertain behavior -     Epidermal / dermal shaving -     Surgical pathology; Standing    Return to clinic: No follow-ups on file.     Documentation: I have reviewed the above documentation for accuracy and completeness, and I agree with  the above.  Lauraine JAYSON Kanaris, MD  "

## 2024-06-09 ENCOUNTER — Ambulatory Visit: Admitting: Dermatology

## 2024-06-15 ENCOUNTER — Inpatient Hospital Stay

## 2024-06-19 ENCOUNTER — Inpatient Hospital Stay

## 2024-06-19 ENCOUNTER — Inpatient Hospital Stay: Admitting: Internal Medicine

## 2024-06-22 ENCOUNTER — Other Ambulatory Visit

## 2024-06-26 ENCOUNTER — Ambulatory Visit: Admitting: Internal Medicine

## 2024-06-26 ENCOUNTER — Ambulatory Visit

## 2024-08-07 ENCOUNTER — Encounter: Admitting: Internal Medicine
